# Patient Record
Sex: Female | Born: 1980
Health system: Southern US, Community
[De-identification: ages and names within clinical notes are randomized; demographics above are authoritative.]

## PROBLEM LIST (undated history)

## (undated) ENCOUNTER — Emergency Department (HOSPITAL_COMMUNITY): Admission: EM

## (undated) DIAGNOSIS — Z789 Other specified health status: Secondary | ICD-10-CM

## (undated) DIAGNOSIS — J309 Allergic rhinitis, unspecified: Secondary | ICD-10-CM

## (undated) DIAGNOSIS — F1090 Alcohol use, unspecified, uncomplicated: Secondary | ICD-10-CM

## (undated) DIAGNOSIS — R87619 Unspecified abnormal cytological findings in specimens from cervix uteri: Secondary | ICD-10-CM

## (undated) DIAGNOSIS — T7840XA Allergy, unspecified, initial encounter: Secondary | ICD-10-CM

## (undated) DIAGNOSIS — K759 Inflammatory liver disease, unspecified: Secondary | ICD-10-CM

## (undated) DIAGNOSIS — M069 Rheumatoid arthritis, unspecified: Secondary | ICD-10-CM

## (undated) DIAGNOSIS — S82892A Other fracture of left lower leg, initial encounter for closed fracture: Secondary | ICD-10-CM

## (undated) DIAGNOSIS — N760 Acute vaginitis: Secondary | ICD-10-CM

## (undated) DIAGNOSIS — I10 Essential (primary) hypertension: Secondary | ICD-10-CM

## (undated) DIAGNOSIS — F32A Depression, unspecified: Secondary | ICD-10-CM

## (undated) DIAGNOSIS — G709 Myoneural disorder, unspecified: Secondary | ICD-10-CM

## (undated) DIAGNOSIS — B009 Herpesviral infection, unspecified: Secondary | ICD-10-CM

## (undated) DIAGNOSIS — E7800 Pure hypercholesterolemia, unspecified: Secondary | ICD-10-CM

## (undated) DIAGNOSIS — Z7289 Other problems related to lifestyle: Secondary | ICD-10-CM

## (undated) DIAGNOSIS — R87629 Unspecified abnormal cytological findings in specimens from vagina: Secondary | ICD-10-CM

## (undated) DIAGNOSIS — F419 Anxiety disorder, unspecified: Secondary | ICD-10-CM

## (undated) DIAGNOSIS — K219 Gastro-esophageal reflux disease without esophagitis: Secondary | ICD-10-CM

## (undated) DIAGNOSIS — D649 Anemia, unspecified: Secondary | ICD-10-CM

## (undated) DIAGNOSIS — B9689 Other specified bacterial agents as the cause of diseases classified elsewhere: Secondary | ICD-10-CM

## (undated) DIAGNOSIS — IMO0002 Reserved for concepts with insufficient information to code with codable children: Secondary | ICD-10-CM

## (undated) DIAGNOSIS — R569 Unspecified convulsions: Secondary | ICD-10-CM

## (undated) DIAGNOSIS — K859 Acute pancreatitis without necrosis or infection, unspecified: Secondary | ICD-10-CM

## (undated) HISTORY — DX: Pure hypercholesterolemia, unspecified: E78.00

## (undated) HISTORY — DX: Unspecified abnormal cytological findings in specimens from cervix uteri: R87.619

## (undated) HISTORY — DX: Essential (primary) hypertension: I10

## (undated) HISTORY — DX: Unspecified abnormal cytological findings in specimens from vagina: R87.629

## (undated) HISTORY — DX: Myoneural disorder, unspecified: G70.9

## (undated) HISTORY — DX: Acute pancreatitis without necrosis or infection, unspecified: K85.90

## (undated) HISTORY — DX: Rheumatoid arthritis, unspecified: M06.9

## (undated) HISTORY — DX: Other specified bacterial agents as the cause of diseases classified elsewhere: B96.89

## (undated) HISTORY — DX: Other specified bacterial agents as the cause of diseases classified elsewhere: N76.0

## (undated) HISTORY — DX: Allergic rhinitis, unspecified: J30.9

## (undated) HISTORY — DX: Depression, unspecified: F32.A

## (undated) HISTORY — DX: Inflammatory liver disease, unspecified: K75.9

## (undated) HISTORY — DX: Other fracture of left lower leg, initial encounter for closed fracture: S82.892A

## (undated) HISTORY — DX: Anemia, unspecified: D64.9

## (undated) HISTORY — DX: Anxiety disorder, unspecified: F41.9

## (undated) HISTORY — DX: Reserved for concepts with insufficient information to code with codable children: IMO0002

## (undated) HISTORY — DX: Allergy, unspecified, initial encounter: T78.40XA

## (undated) HISTORY — DX: Herpesviral infection, unspecified: B00.9

## (undated) HISTORY — PX: OTHER SURGICAL HISTORY: SHX169

---

## 1999-05-18 ENCOUNTER — Inpatient Hospital Stay (HOSPITAL_COMMUNITY): Admission: AD | Admit: 1999-05-18 | Discharge: 1999-05-18 | Payer: Self-pay | Admitting: Obstetrics & Gynecology

## 1999-11-20 ENCOUNTER — Emergency Department (HOSPITAL_COMMUNITY): Admission: EM | Admit: 1999-11-20 | Discharge: 1999-11-20 | Payer: Self-pay | Admitting: Emergency Medicine

## 2000-08-24 ENCOUNTER — Encounter (INDEPENDENT_AMBULATORY_CARE_PROVIDER_SITE_OTHER): Payer: Self-pay | Admitting: Specialist

## 2000-08-24 ENCOUNTER — Other Ambulatory Visit: Admission: RE | Admit: 2000-08-24 | Discharge: 2000-08-24 | Payer: Self-pay | Admitting: Obstetrics

## 2001-02-11 ENCOUNTER — Ambulatory Visit (HOSPITAL_COMMUNITY): Admission: RE | Admit: 2001-02-11 | Discharge: 2001-02-11 | Payer: Self-pay | Admitting: *Deleted

## 2001-02-20 ENCOUNTER — Inpatient Hospital Stay (HOSPITAL_COMMUNITY): Admission: AD | Admit: 2001-02-20 | Discharge: 2001-02-20 | Payer: Self-pay | Admitting: *Deleted

## 2001-07-02 ENCOUNTER — Inpatient Hospital Stay (HOSPITAL_COMMUNITY): Admission: AD | Admit: 2001-07-02 | Discharge: 2001-07-02 | Payer: Self-pay | Admitting: *Deleted

## 2001-07-09 ENCOUNTER — Encounter (HOSPITAL_COMMUNITY): Admission: RE | Admit: 2001-07-09 | Discharge: 2001-07-16 | Payer: Self-pay | Admitting: *Deleted

## 2001-07-12 ENCOUNTER — Encounter: Payer: Self-pay | Admitting: Obstetrics & Gynecology

## 2001-07-12 ENCOUNTER — Inpatient Hospital Stay (HOSPITAL_COMMUNITY): Admission: AD | Admit: 2001-07-12 | Discharge: 2001-07-12 | Payer: Self-pay | Admitting: *Deleted

## 2001-07-17 ENCOUNTER — Encounter (INDEPENDENT_AMBULATORY_CARE_PROVIDER_SITE_OTHER): Payer: Self-pay

## 2001-07-17 ENCOUNTER — Inpatient Hospital Stay (HOSPITAL_COMMUNITY): Admission: AD | Admit: 2001-07-17 | Discharge: 2001-07-20 | Payer: Self-pay | Admitting: Obstetrics

## 2007-08-13 ENCOUNTER — Emergency Department (HOSPITAL_COMMUNITY): Admission: EM | Admit: 2007-08-13 | Discharge: 2007-08-13 | Payer: Self-pay | Admitting: Emergency Medicine

## 2008-02-12 ENCOUNTER — Other Ambulatory Visit: Admission: RE | Admit: 2008-02-12 | Discharge: 2008-02-12 | Payer: Self-pay | Admitting: Obstetrics and Gynecology

## 2008-08-21 ENCOUNTER — Other Ambulatory Visit: Admission: RE | Admit: 2008-08-21 | Discharge: 2008-08-21 | Payer: Self-pay | Admitting: Obstetrics and Gynecology

## 2008-10-01 ENCOUNTER — Emergency Department (HOSPITAL_COMMUNITY): Admission: EM | Admit: 2008-10-01 | Discharge: 2008-10-01 | Payer: Self-pay | Admitting: Emergency Medicine

## 2008-10-22 ENCOUNTER — Emergency Department (HOSPITAL_COMMUNITY): Admission: EM | Admit: 2008-10-22 | Discharge: 2008-10-22 | Payer: Self-pay | Admitting: Emergency Medicine

## 2008-11-24 ENCOUNTER — Emergency Department (HOSPITAL_COMMUNITY): Admission: EM | Admit: 2008-11-24 | Discharge: 2008-11-24 | Payer: Self-pay | Admitting: Emergency Medicine

## 2008-12-16 ENCOUNTER — Emergency Department (HOSPITAL_COMMUNITY): Admission: EM | Admit: 2008-12-16 | Discharge: 2008-12-16 | Payer: Self-pay | Admitting: Emergency Medicine

## 2008-12-16 IMAGING — CR DG CHEST 2V
2 series · 2 of 2 positions shown · non-contrast
Comparison: None

CLINICAL DATA: Diffuse joint pain

CHEST - 2 VIEW

[view not recorded (1 of 2)]
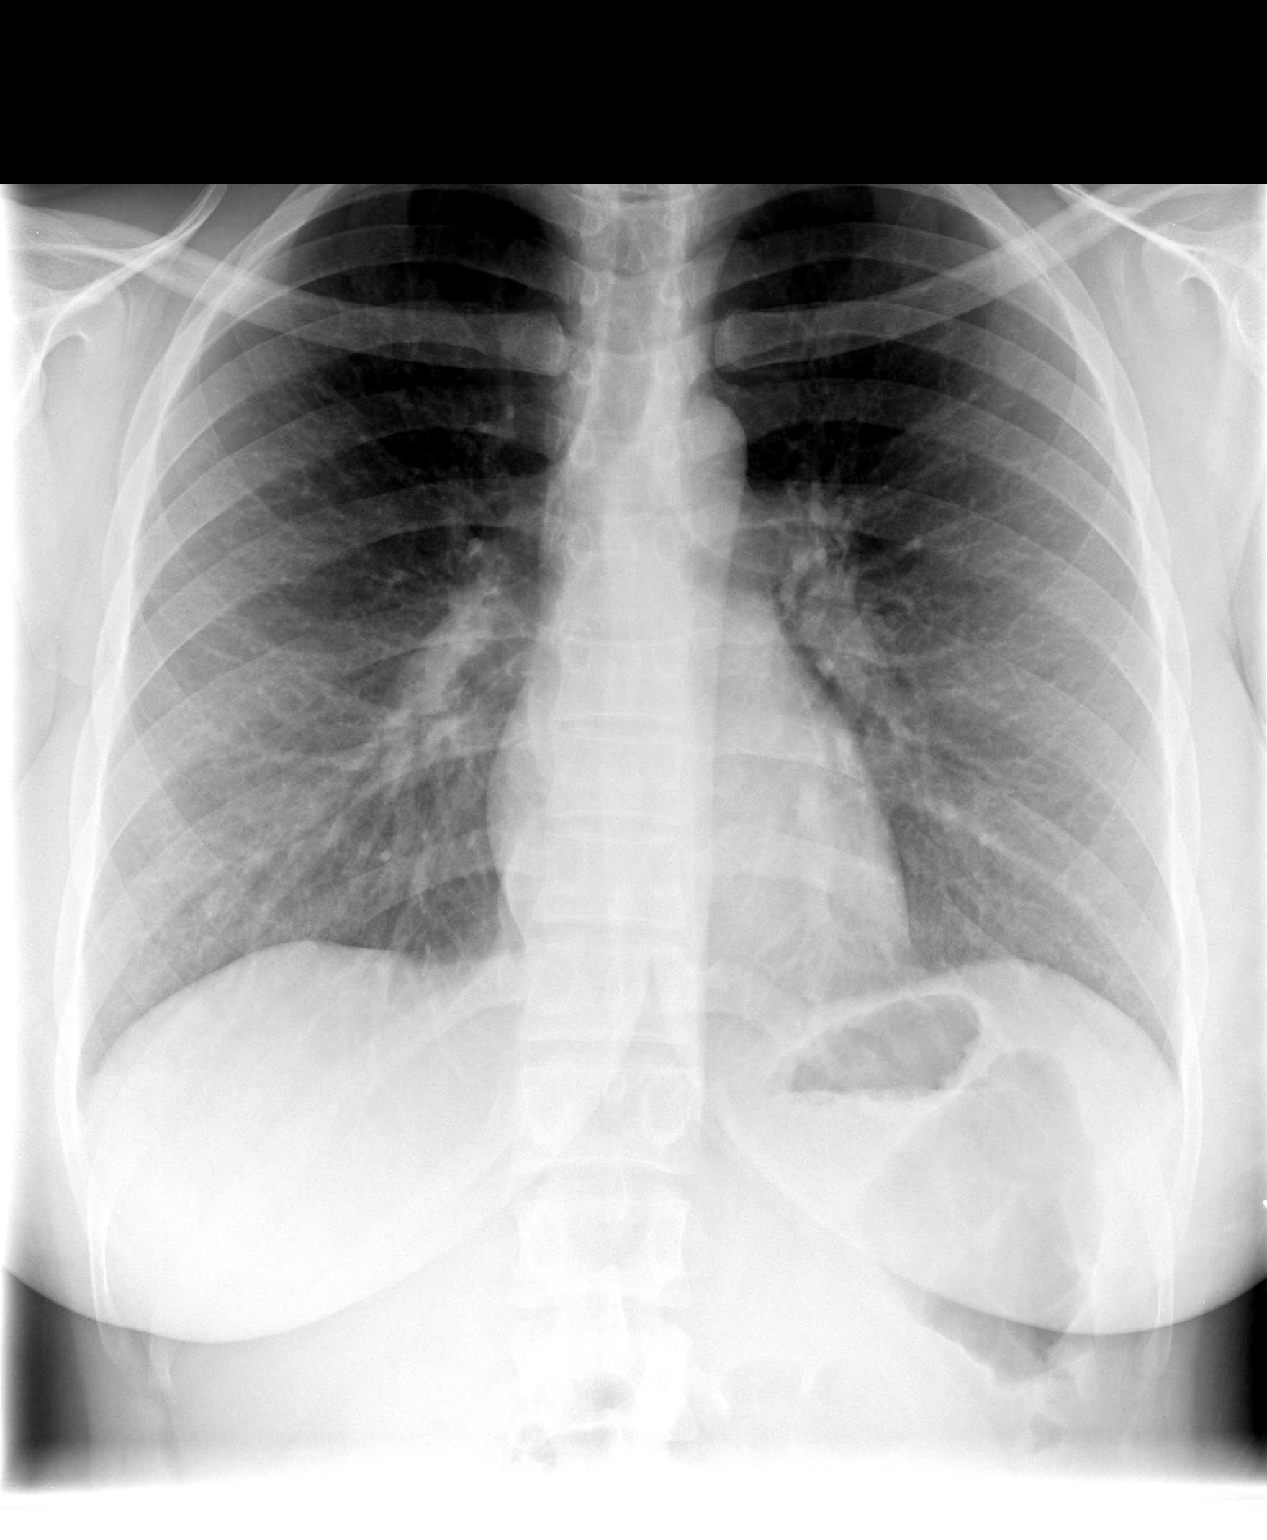

[view not recorded (2 of 2)]
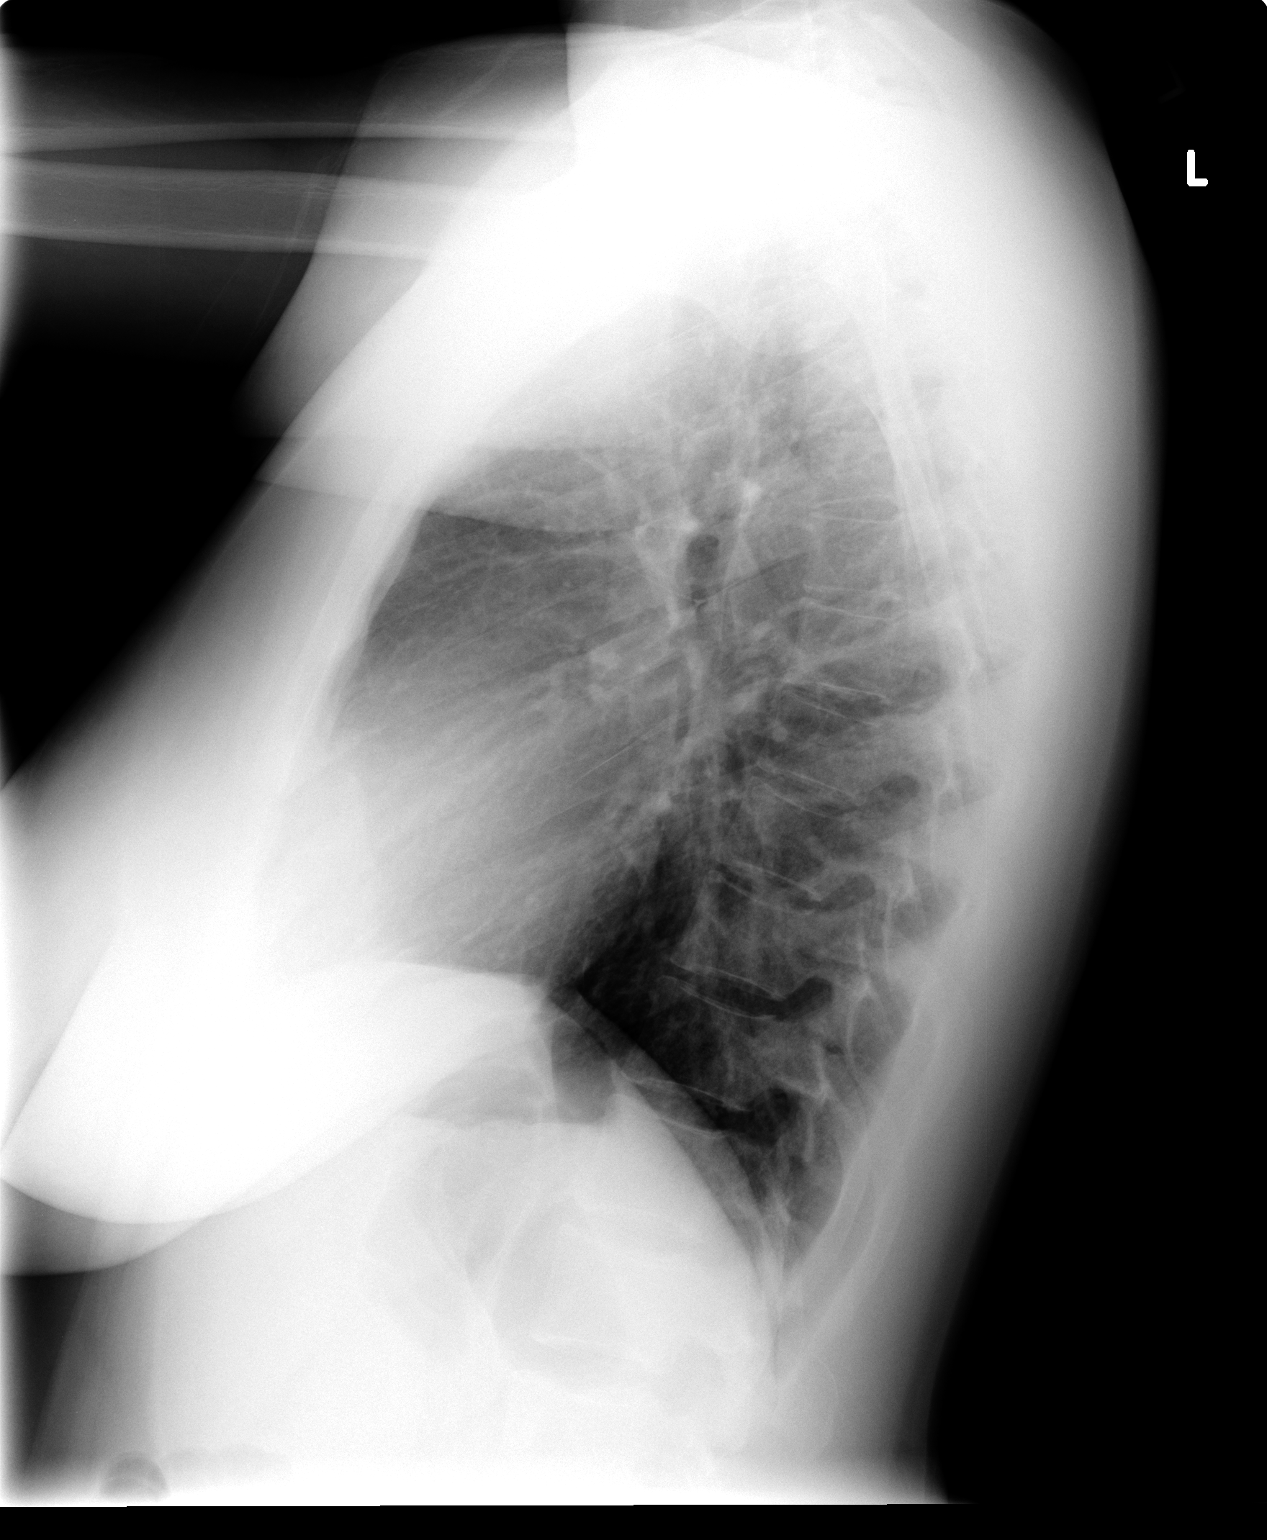

[2 of 2 positions shown; findings below may reference images not displayed]

FINDINGS: Normal heart size, mediastinal contours, and pulmonary vascularity.
Lungs clear.
Bones unremarkable.
No pneumothorax.
IMPRESSION: No acute abnormalities.

## 2009-04-23 ENCOUNTER — Other Ambulatory Visit: Admission: RE | Admit: 2009-04-23 | Discharge: 2009-04-23 | Payer: Self-pay | Admitting: Obstetrics and Gynecology

## 2009-08-30 ENCOUNTER — Other Ambulatory Visit: Admission: RE | Admit: 2009-08-30 | Discharge: 2009-08-30 | Payer: Self-pay | Admitting: Obstetrics and Gynecology

## 2009-09-27 ENCOUNTER — Ambulatory Visit (HOSPITAL_COMMUNITY): Admission: RE | Admit: 2009-09-27 | Discharge: 2009-09-27 | Payer: Self-pay | Admitting: Family Medicine

## 2009-09-27 IMAGING — CR DG HAND COMPLETE 3+V*L*
3 series · 3 of 3 positions shown · non-contrast
Comparison: None

CLINICAL DATA: Question of R A.  Bilateral hand joint pain and
swelling intermittently for 1 year

LEFT HAND - COMPLETE 3+ VIEW

[view not recorded (1 of 3)]
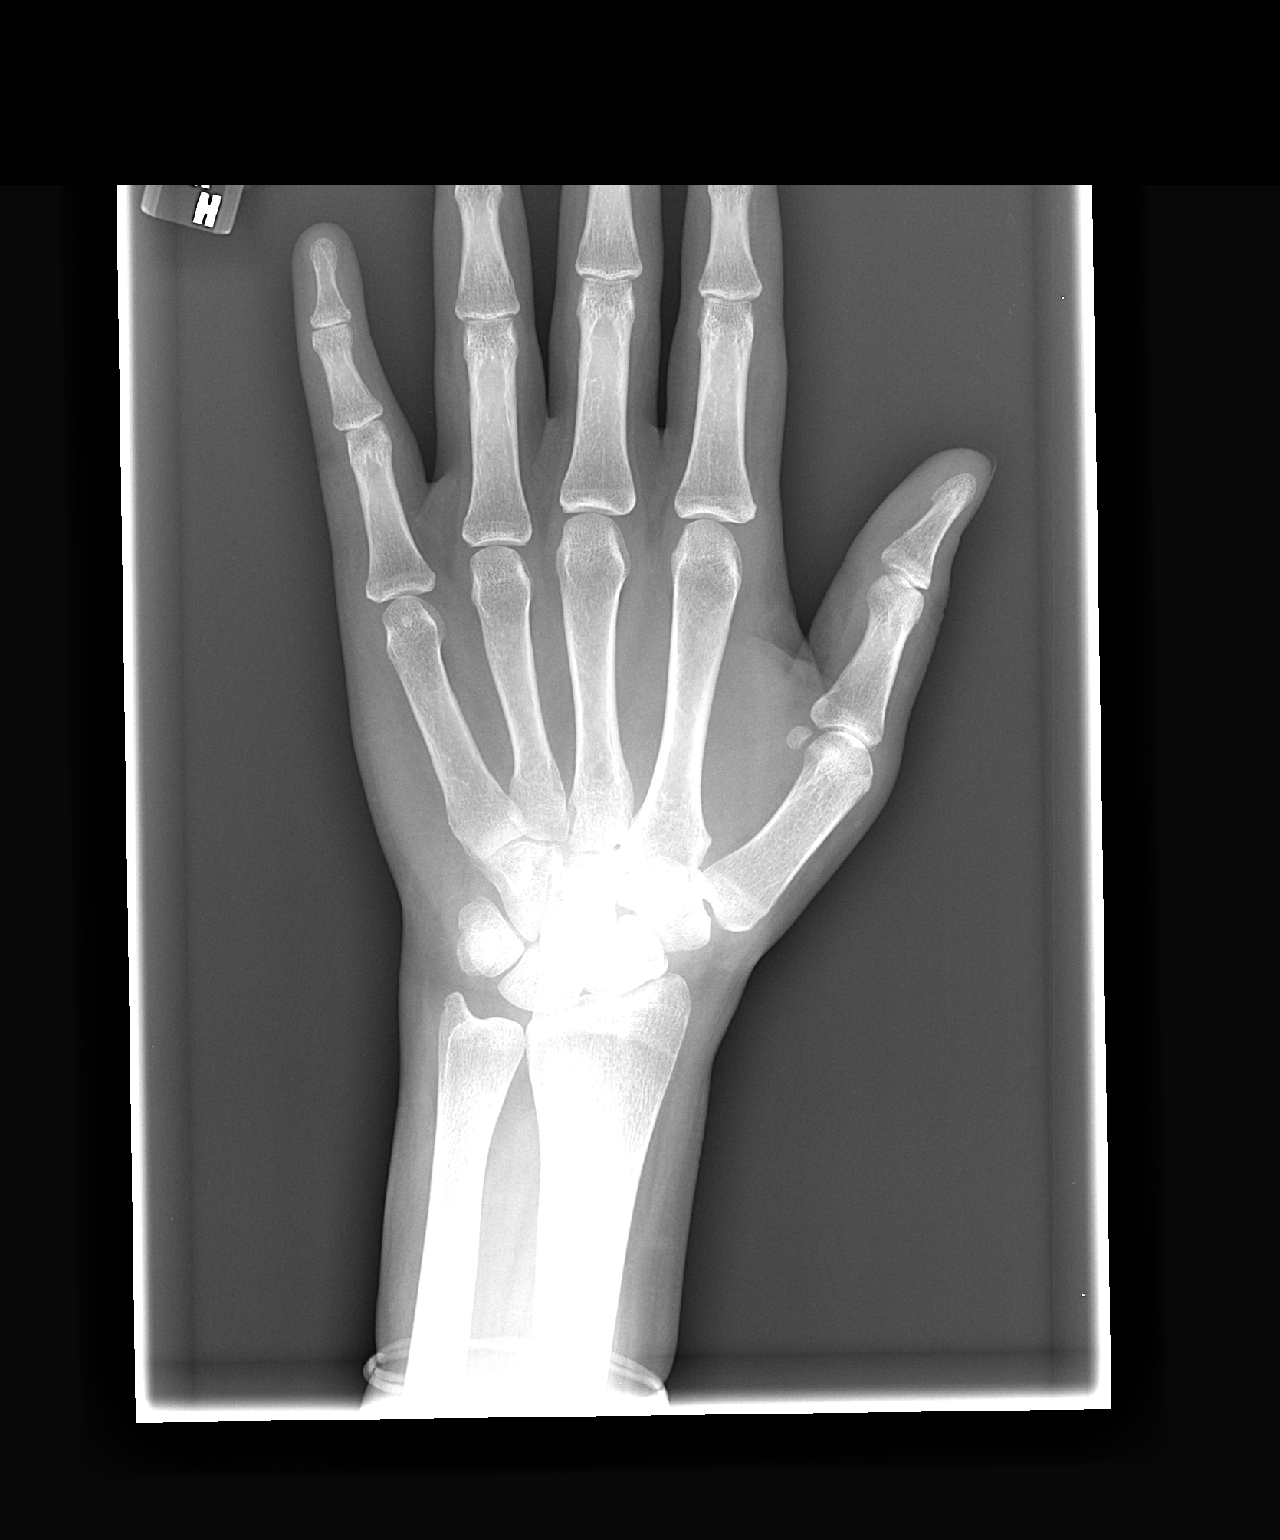

[view not recorded (2 of 3)]
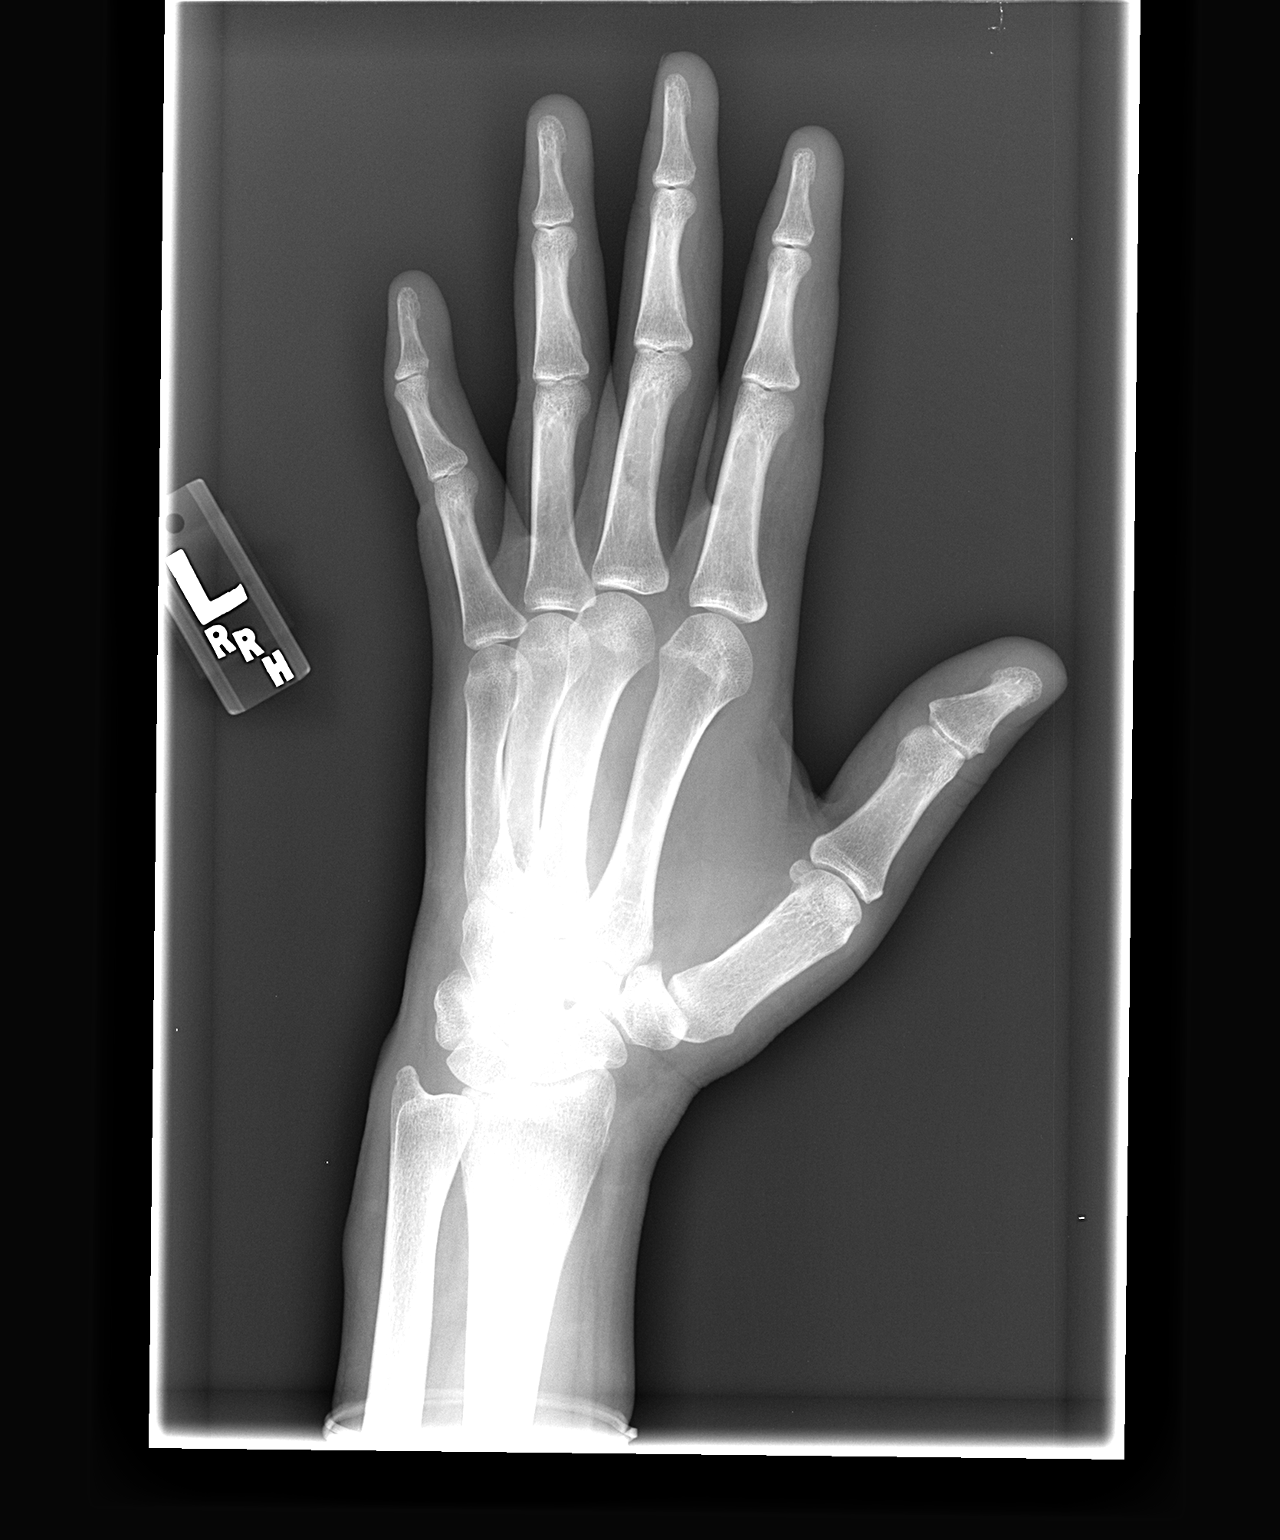

[view not recorded (3 of 3)]
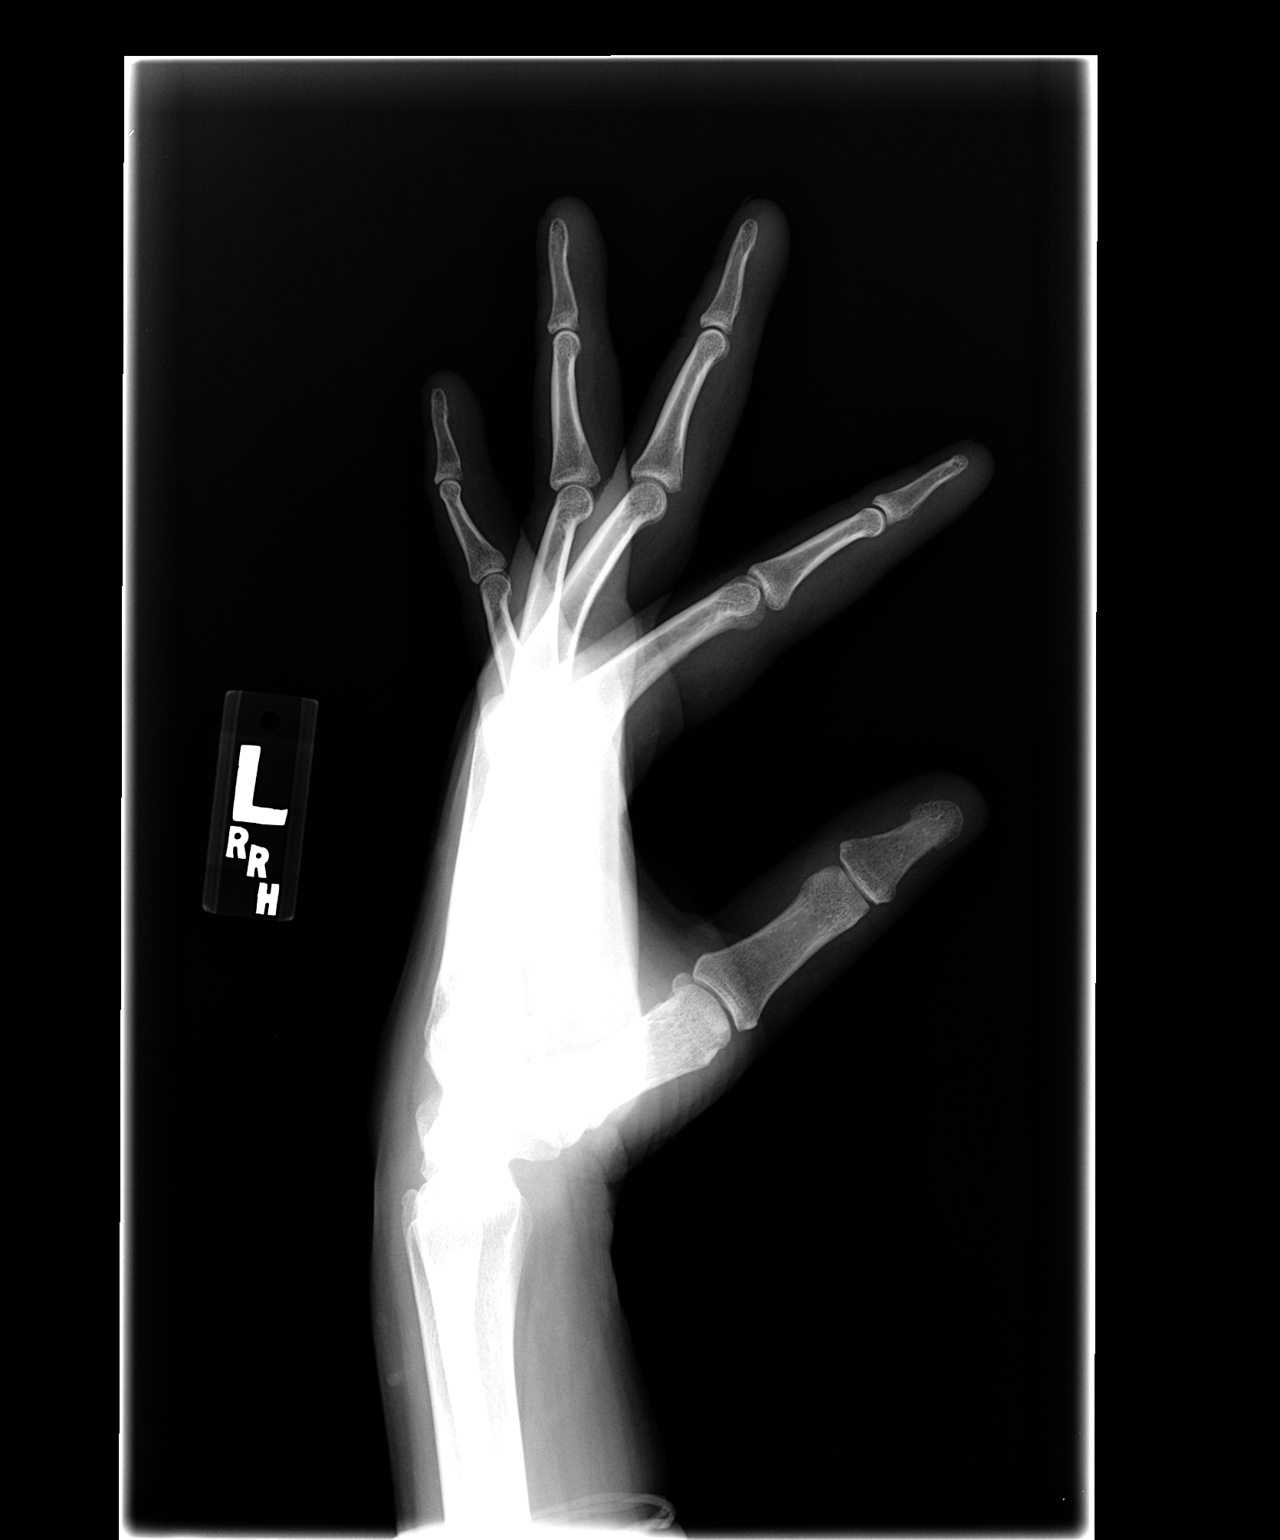

[3 of 3 positions shown; findings below may reference images not displayed]

FINDINGS: No joint space narrowing.  No bony erosions.  No
osteophytic formation.  No soft tissue calcifications.
IMPRESSION: Negative left hand.

## 2009-09-27 IMAGING — CR DG HAND COMPLETE 3+V*R*
3 series · 3 of 3 positions shown · non-contrast
Comparison: None

CLINICAL DATA: Query R A.  Bilateral hand joint pain and swelling
intermittently for 1 year.

RIGHT HAND - COMPLETE 3+ VIEW

[view not recorded (1 of 3)]
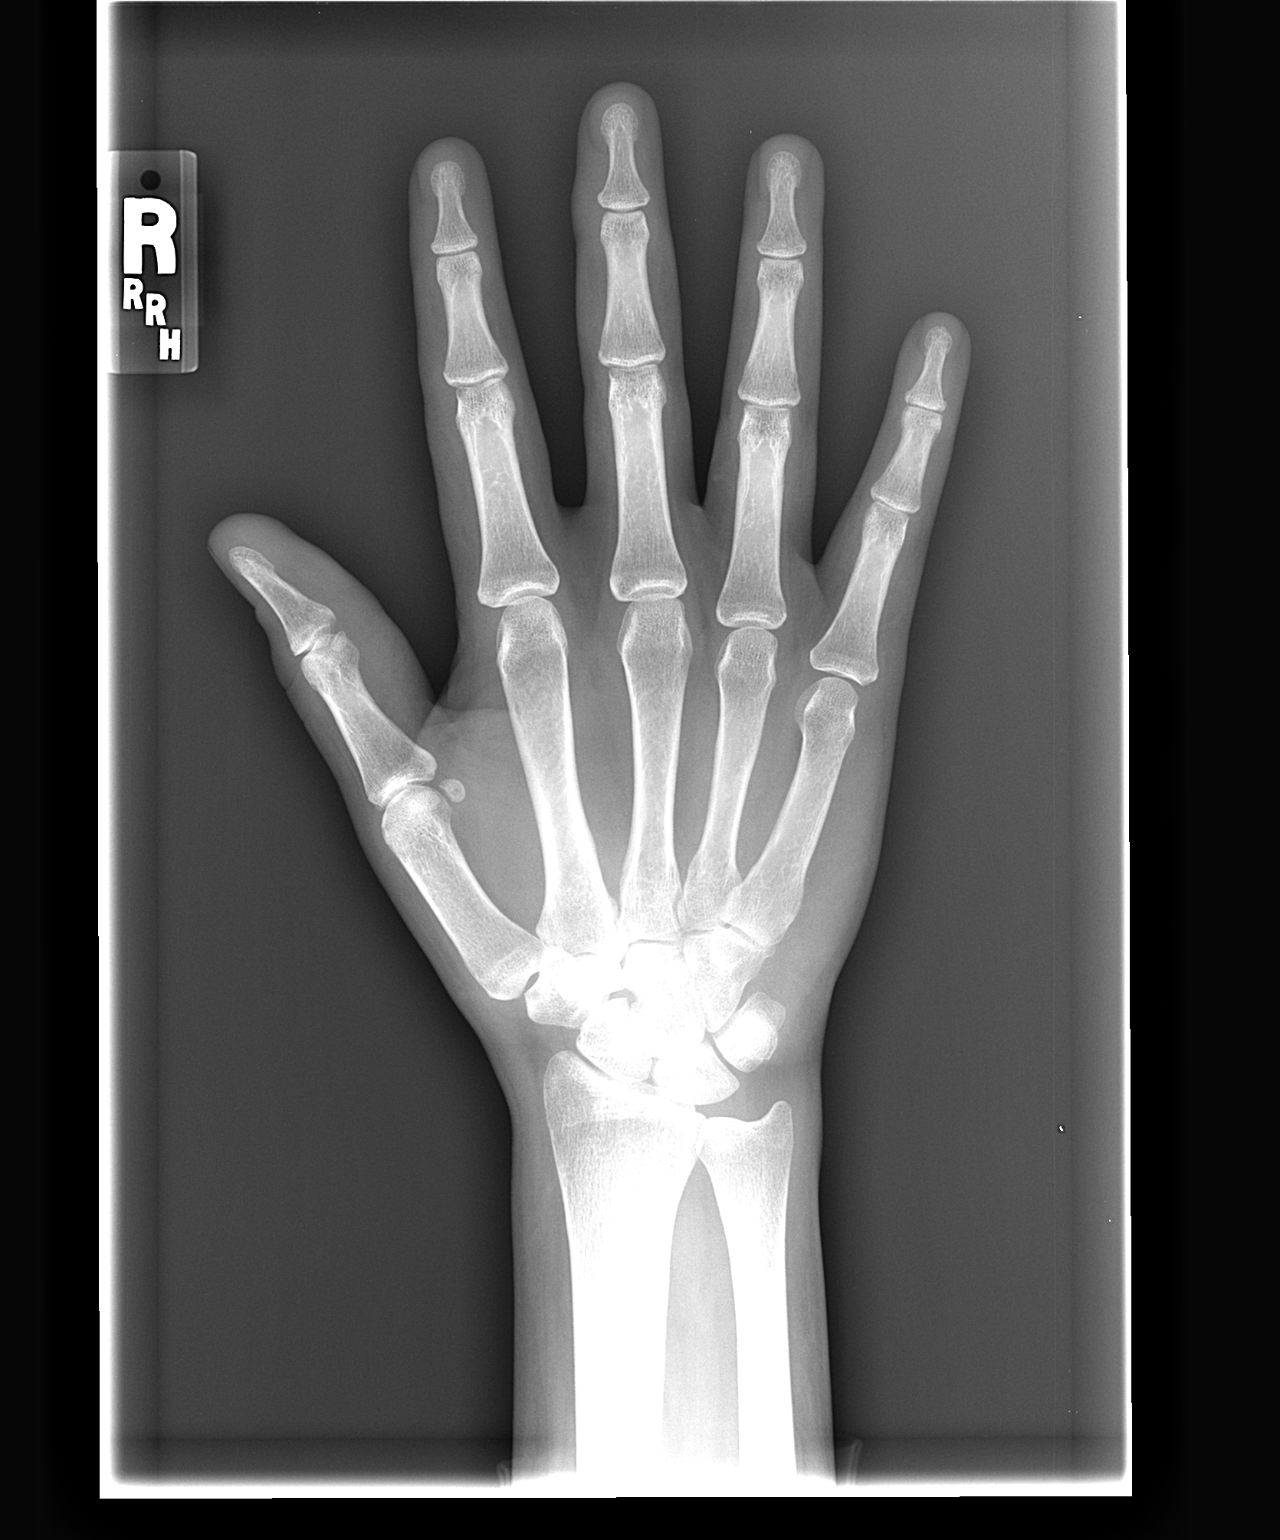

[view not recorded (2 of 3)]
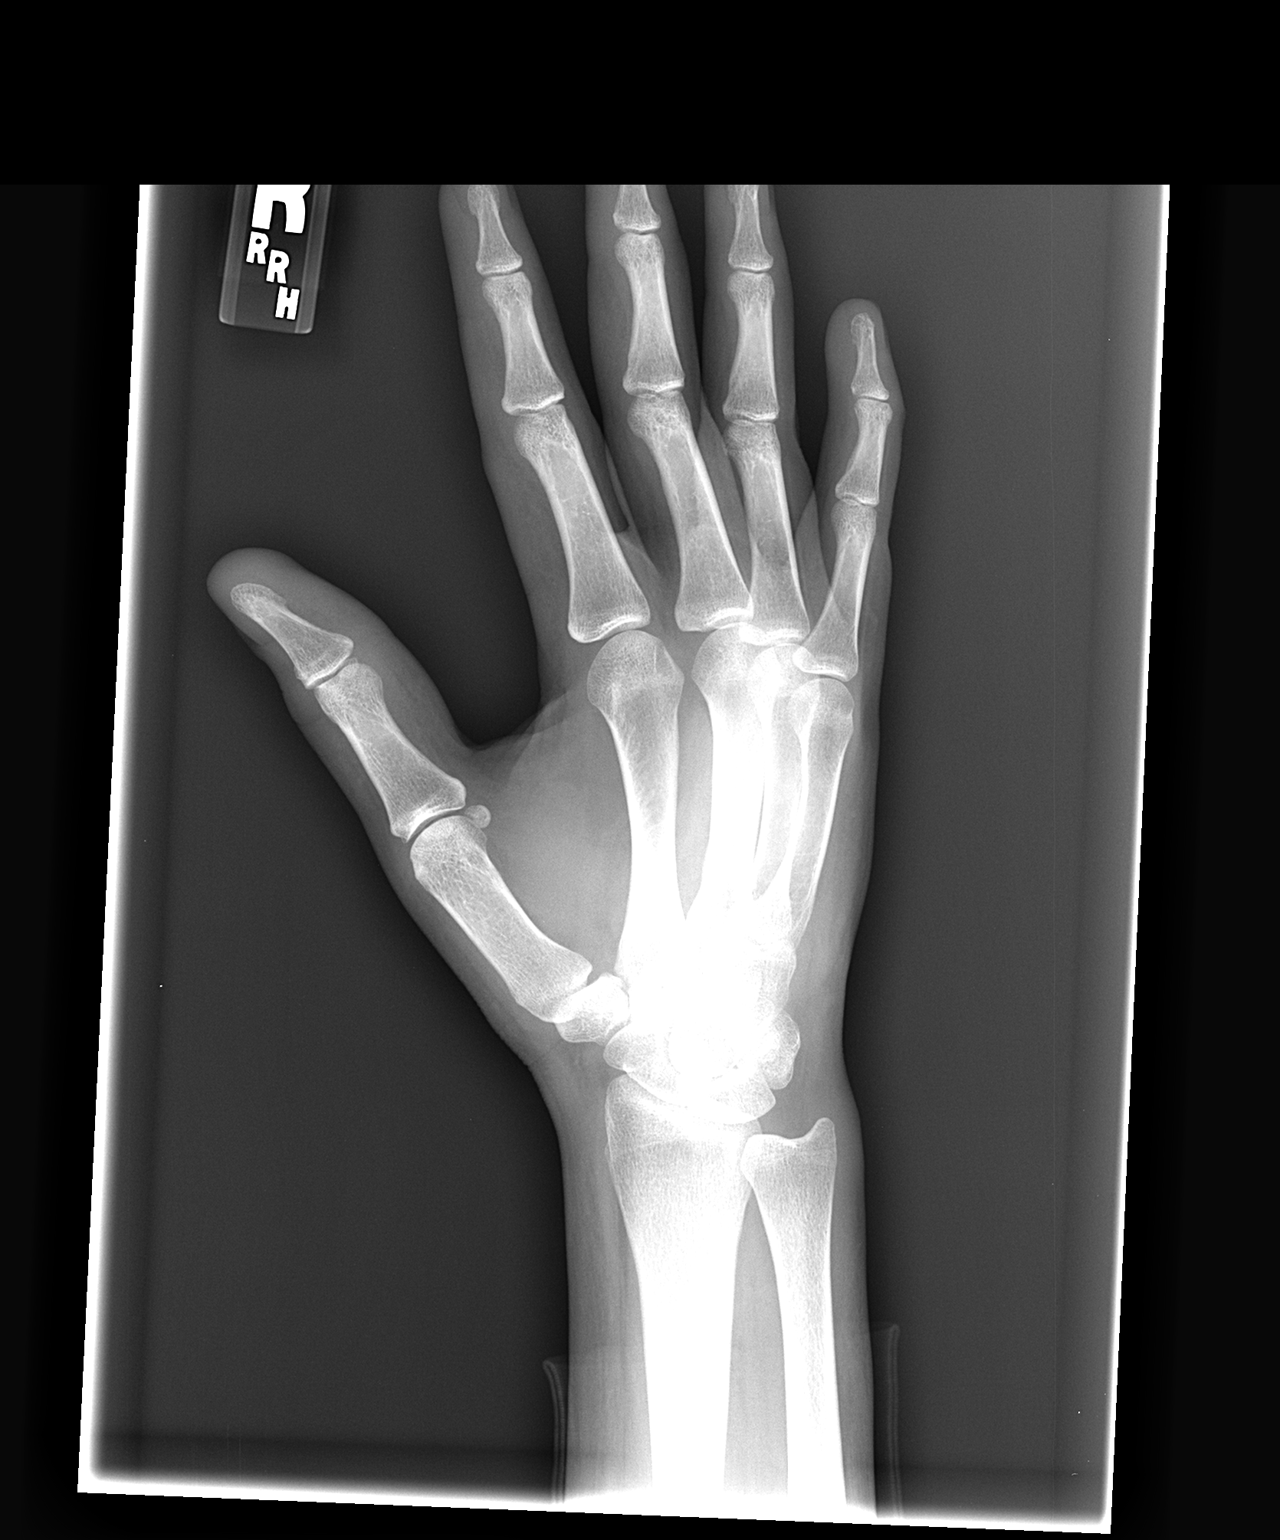

[view not recorded (3 of 3)]
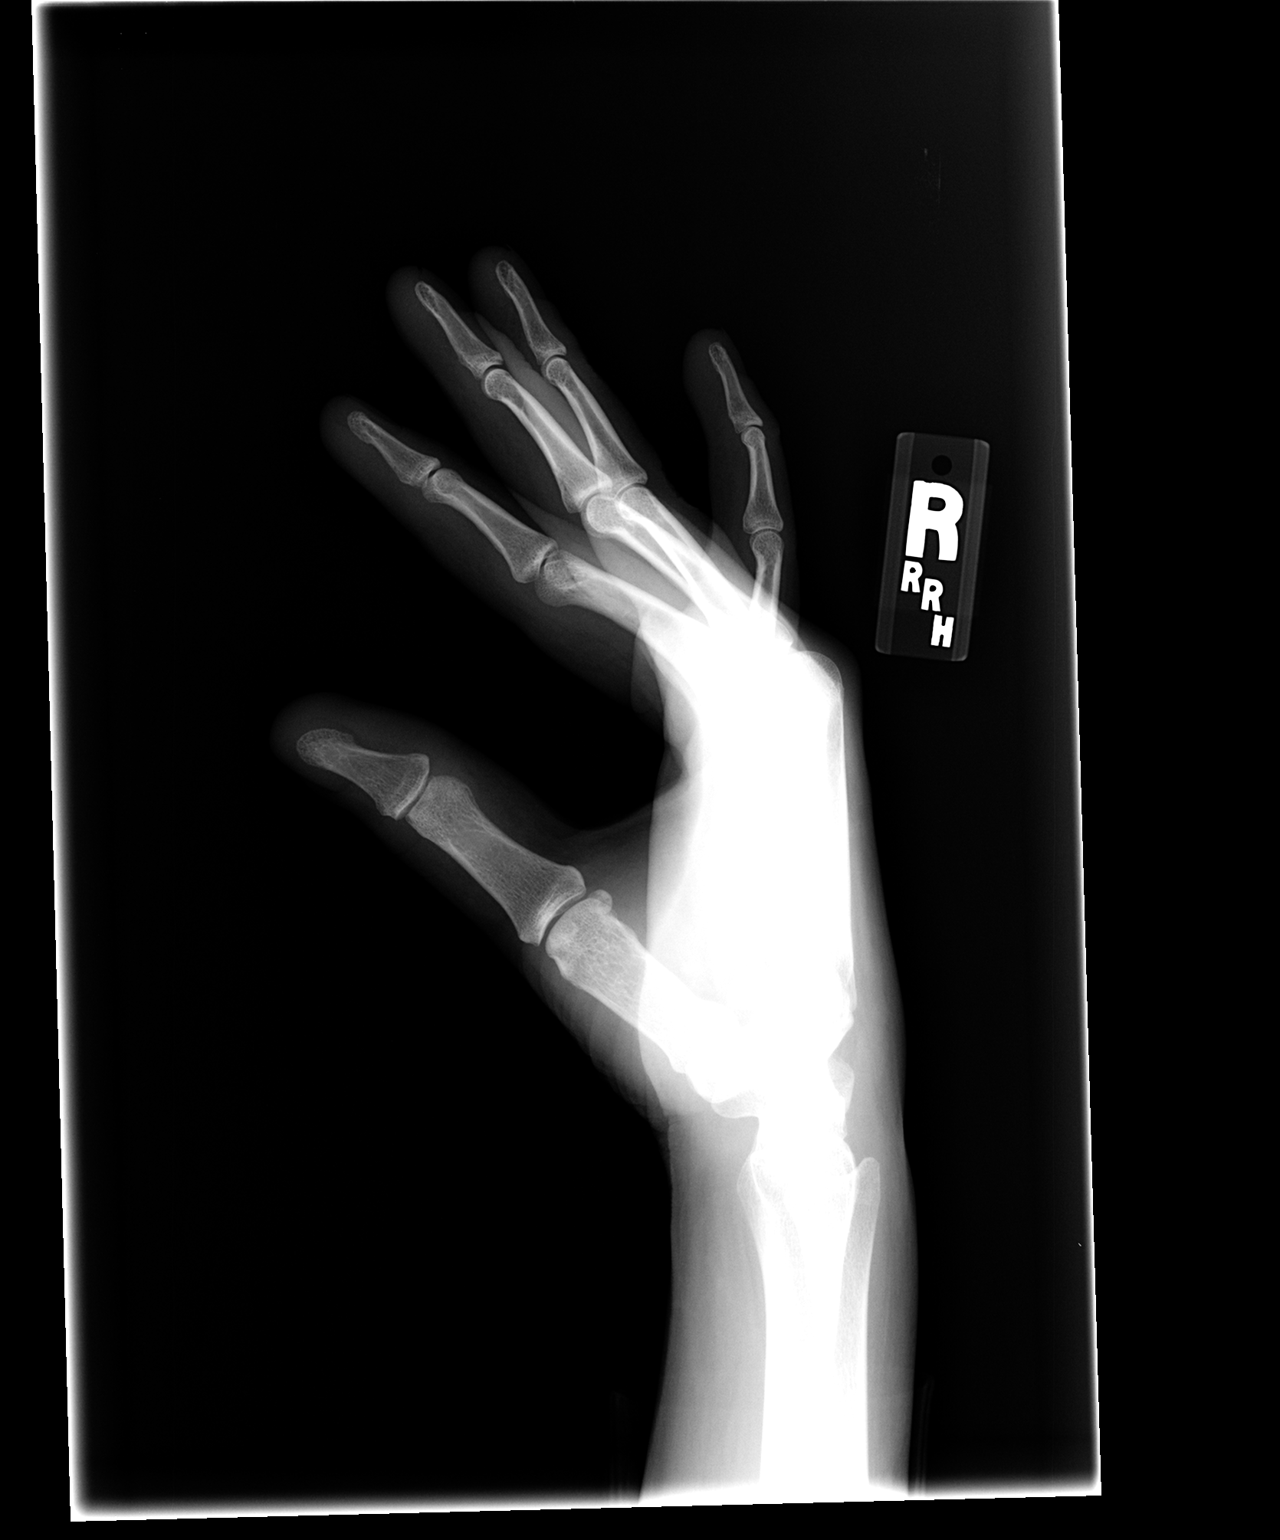

[3 of 3 positions shown; findings below may reference images not displayed]

FINDINGS: No joint space narrowing.  No para-articular erosions.
No lytic or sclerotic lesions.  No osteophytes.  No soft tissue
calcifications.
IMPRESSION: Negative right hand.

## 2010-03-24 ENCOUNTER — Emergency Department (HOSPITAL_COMMUNITY): Admission: EM | Admit: 2010-03-24 | Discharge: 2010-03-24 | Payer: Self-pay | Admitting: Emergency Medicine

## 2010-04-04 ENCOUNTER — Other Ambulatory Visit: Admission: RE | Admit: 2010-04-04 | Discharge: 2010-04-04 | Payer: Self-pay | Admitting: Obstetrics and Gynecology

## 2010-04-11 ENCOUNTER — Ambulatory Visit (HOSPITAL_COMMUNITY): Admission: RE | Admit: 2010-04-11 | Discharge: 2010-04-11 | Payer: Self-pay | Admitting: Obstetrics & Gynecology

## 2010-04-11 IMAGING — US US OB NUCHAL TRANSLUCENCY 1ST GEST
1 series · 14 of 28 positions shown · non-contrast
Comparison: none

OBSTETRICAL ULTRASOUND:
 This ultrasound was performed in The [HOSPITAL], and the [facility] report will be stored to [REDACTED] PACS.  This report is also available in [HOSPITAL]?s accessANYware.

[Series 1: us ob nuchal translucency 1st gest · 14 of 39 slices shown]
[im 2/39]
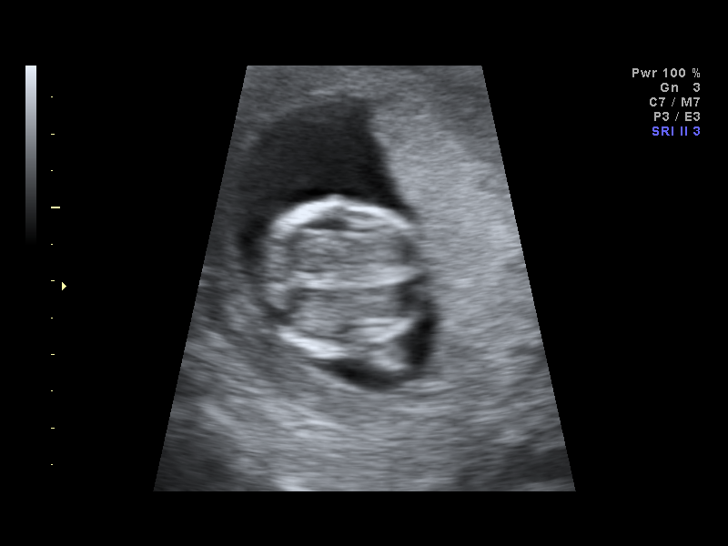
[im 5/39]
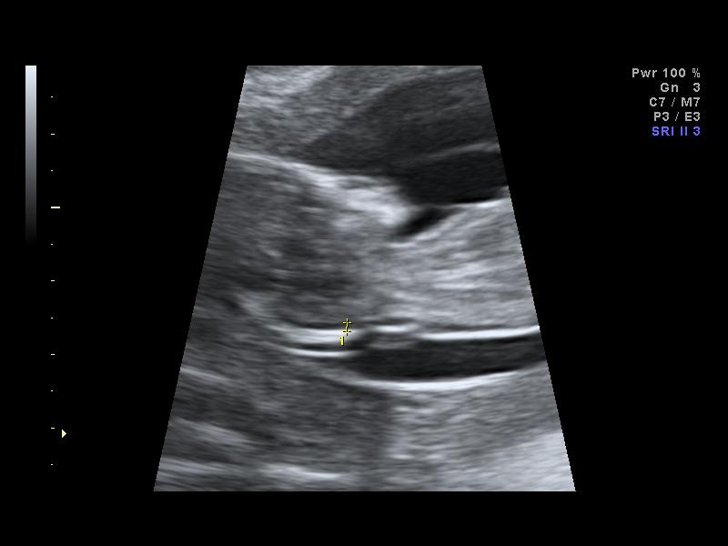
[im 8/39]
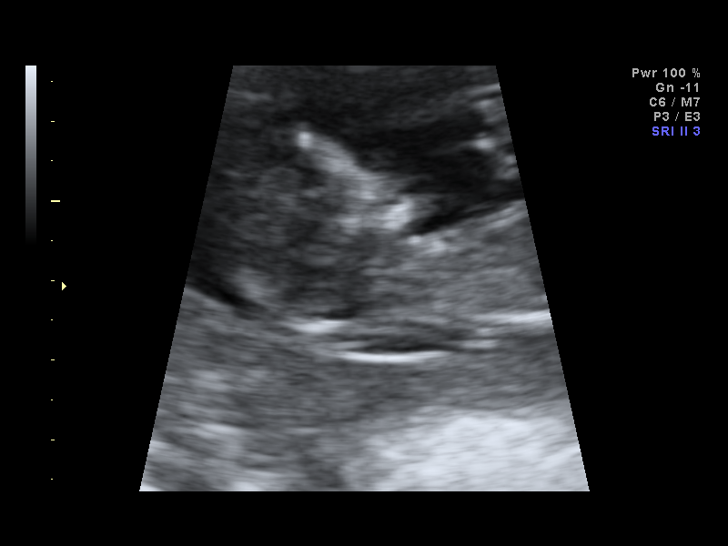
[im 10/39]
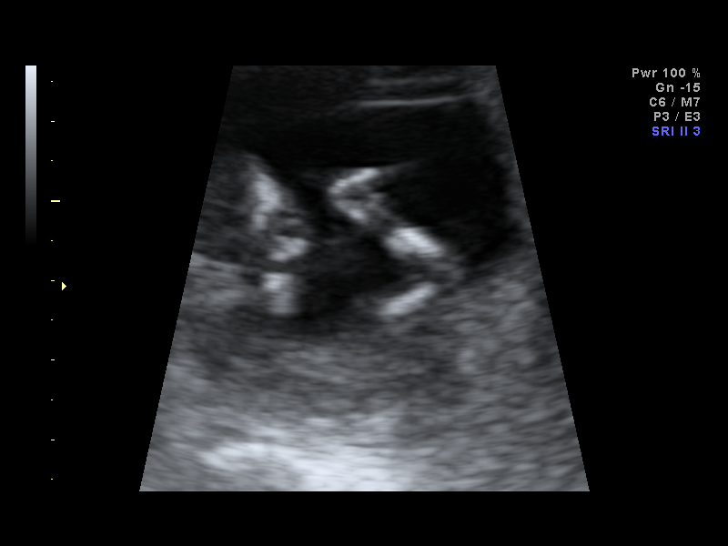
[im 13/39]
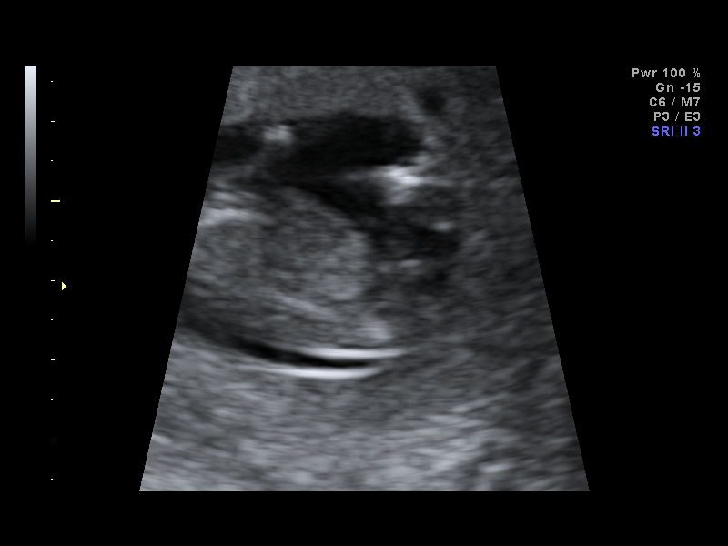
[im 16/39]
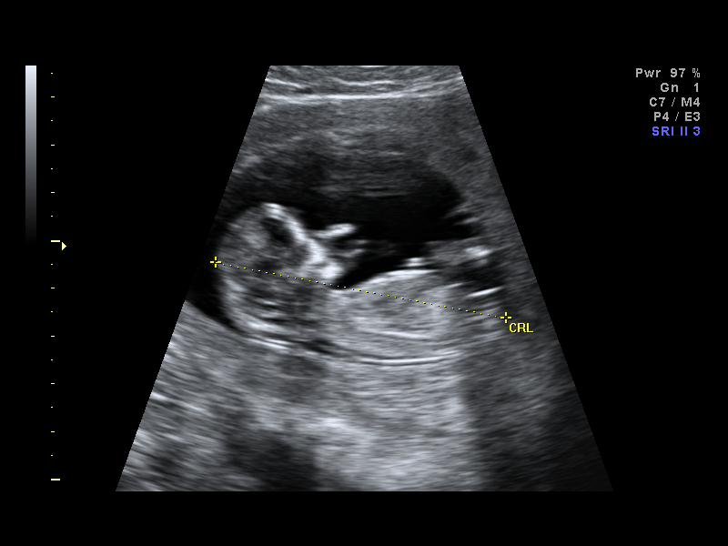
[im 19/39]
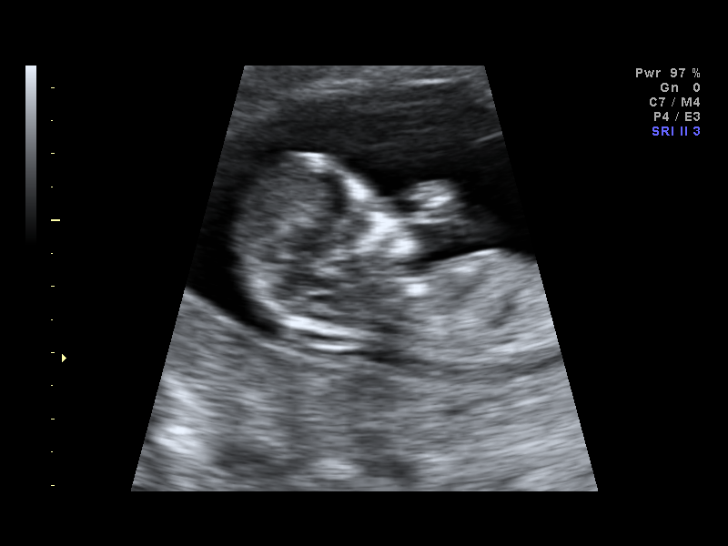
[im 22/39]
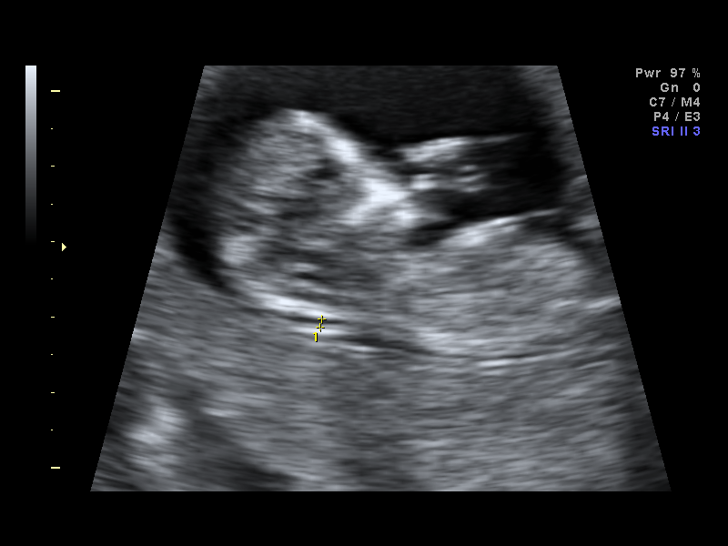
[im 24/39]
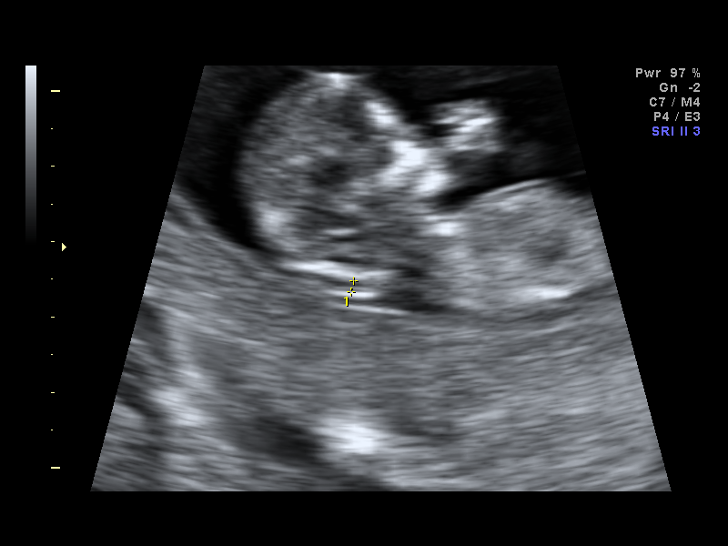
[im 27/39]
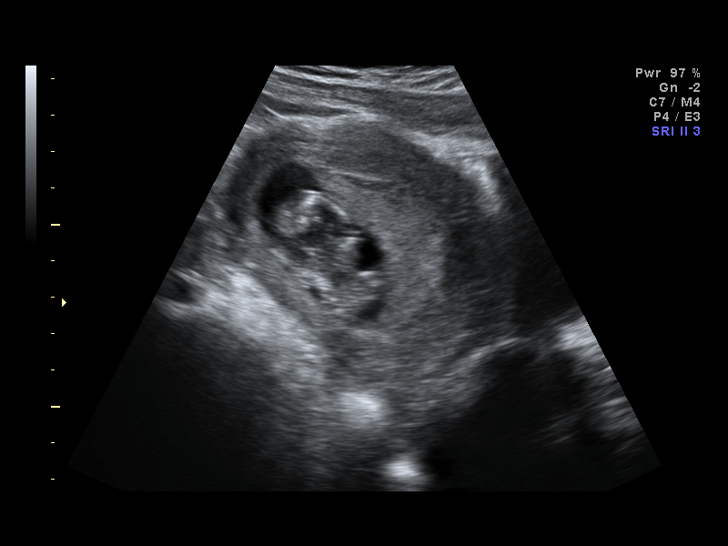
[im 30/39]
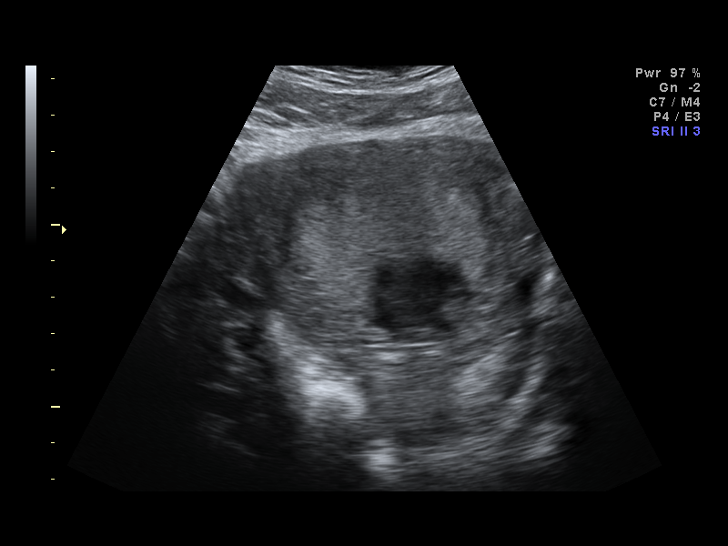
[im 33/39]
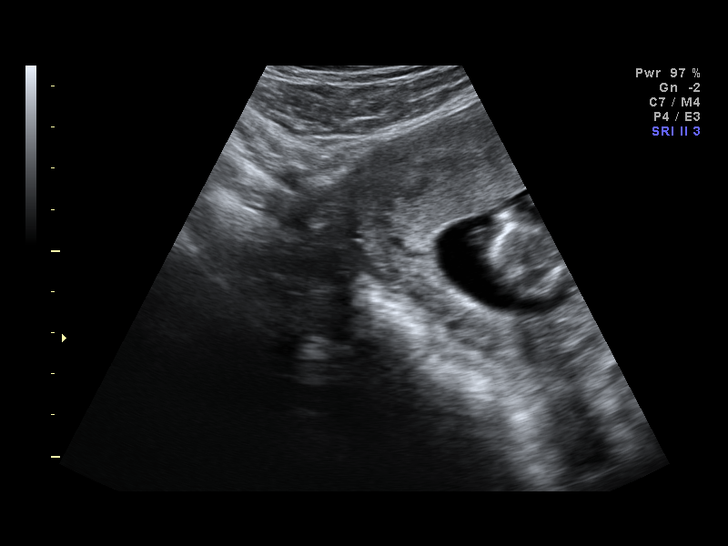
[im 36/39]
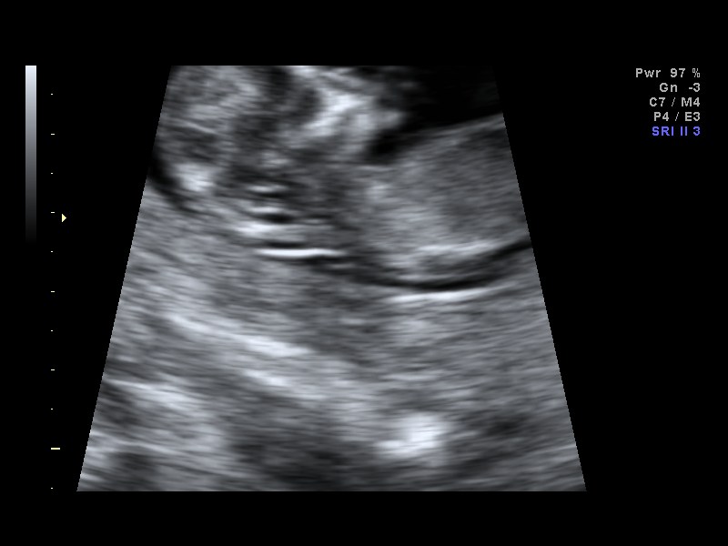
[im 39/39]
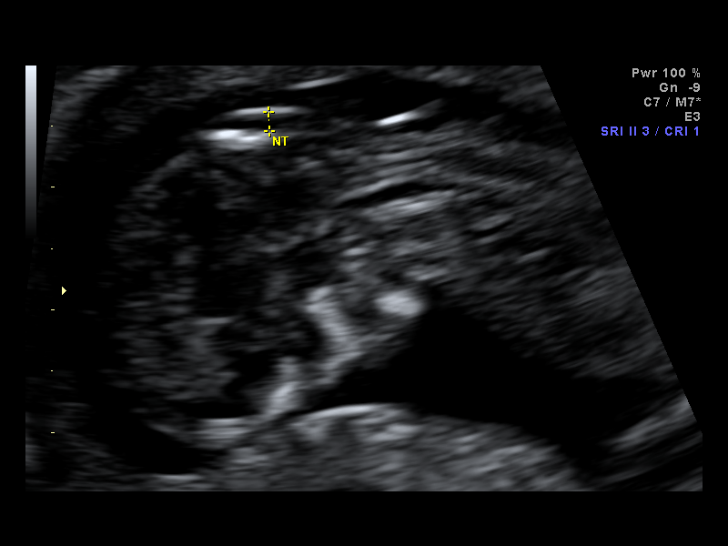

[14 of 28 positions shown; findings below may reference images not displayed]

IMPRESSION: [facility] has also been faxed to the ordering physician.

## 2010-05-03 ENCOUNTER — Ambulatory Visit (HOSPITAL_COMMUNITY): Admission: RE | Admit: 2010-05-03 | Discharge: 2010-05-03 | Payer: Self-pay | Admitting: Obstetrics & Gynecology

## 2010-05-17 ENCOUNTER — Ambulatory Visit (HOSPITAL_COMMUNITY): Admission: RE | Admit: 2010-05-17 | Discharge: 2010-05-17 | Payer: Self-pay | Admitting: Obstetrics & Gynecology

## 2010-05-17 IMAGING — US US OB DETAIL+14 WK
1 series · 14 of 28 positions shown · non-contrast
Comparison: none

OBSTETRICAL ULTRASOUND:
 This ultrasound was performed in The [HOSPITAL], and the [facility] report will be stored to [REDACTED] PACS.  This report is also available in [HOSPITAL]?s accessANYware.

[Series 1: us ob detail+14 wk · 14 of 108 slices shown]
[im 4/108]
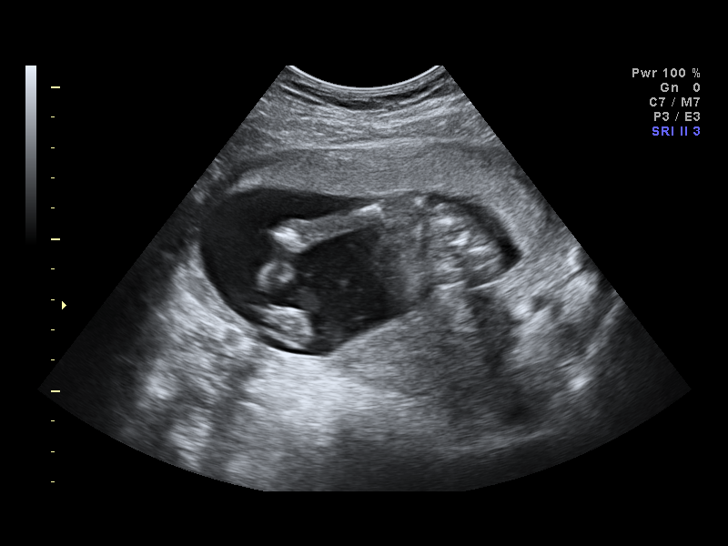
[im 12/108]
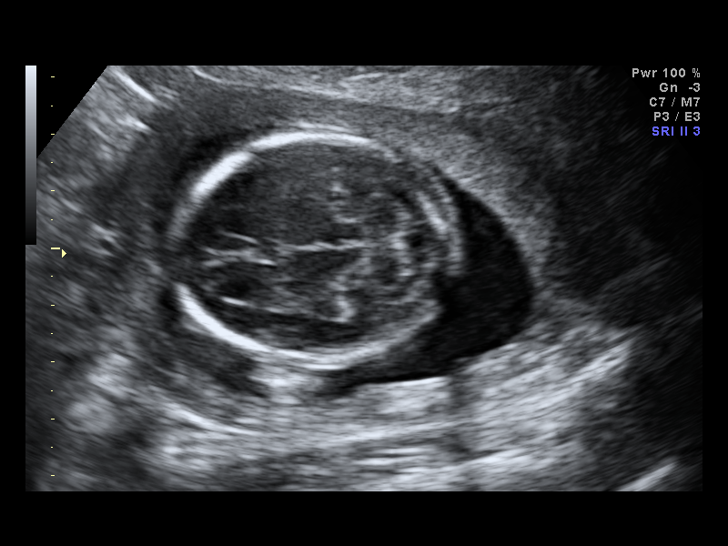
[im 20/108]
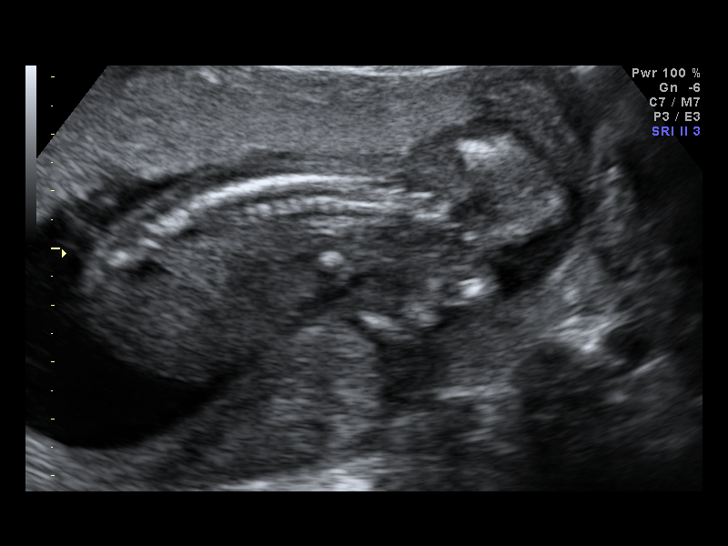
[im 28/108]
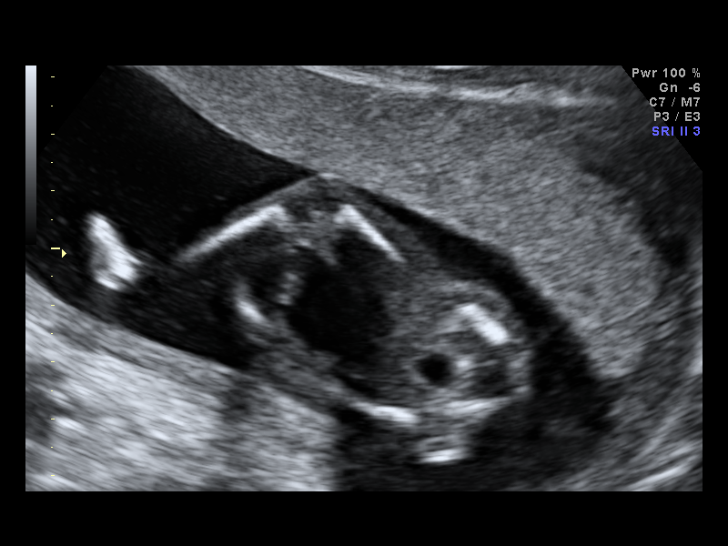
[im 36/108]
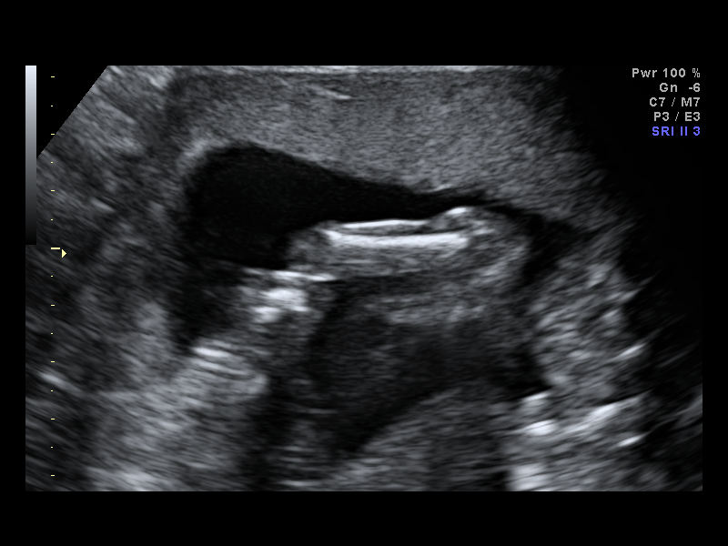
[im 44/108]
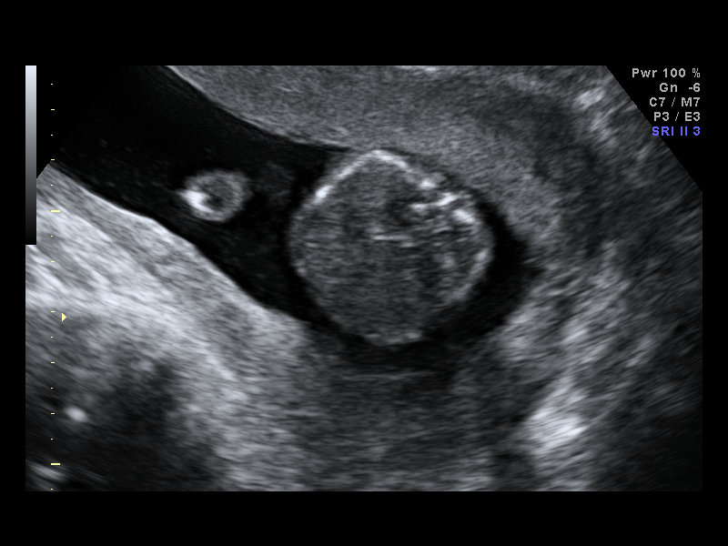
[im 52/108]
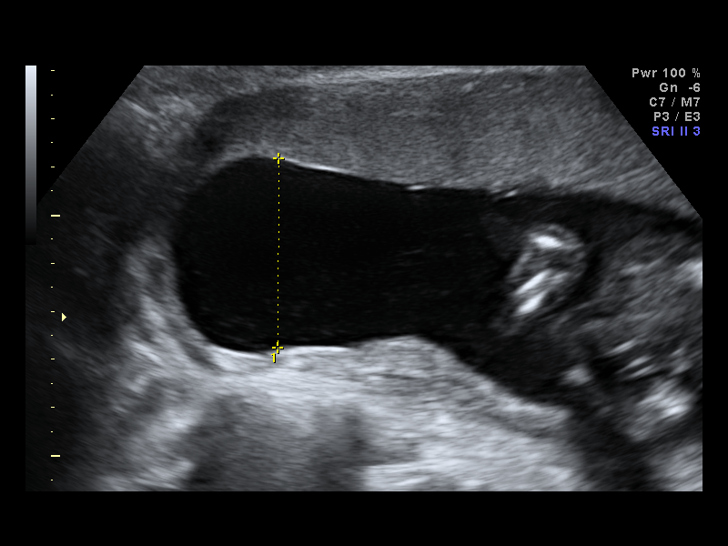
[im 60/108]
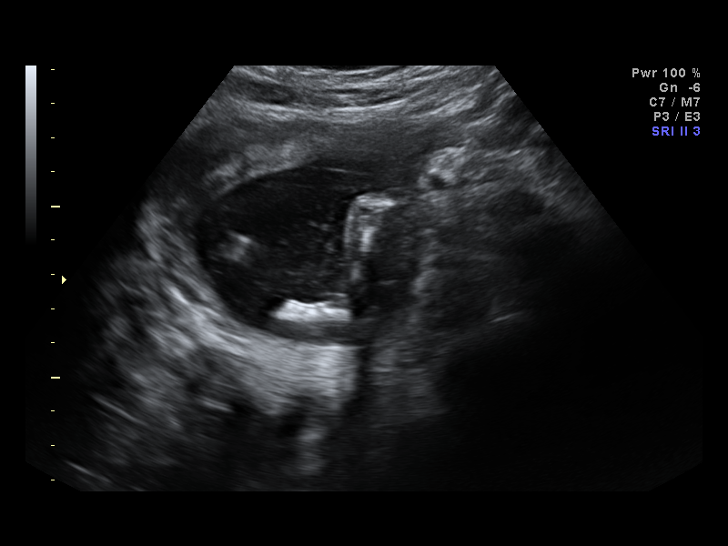
[im 68/108]
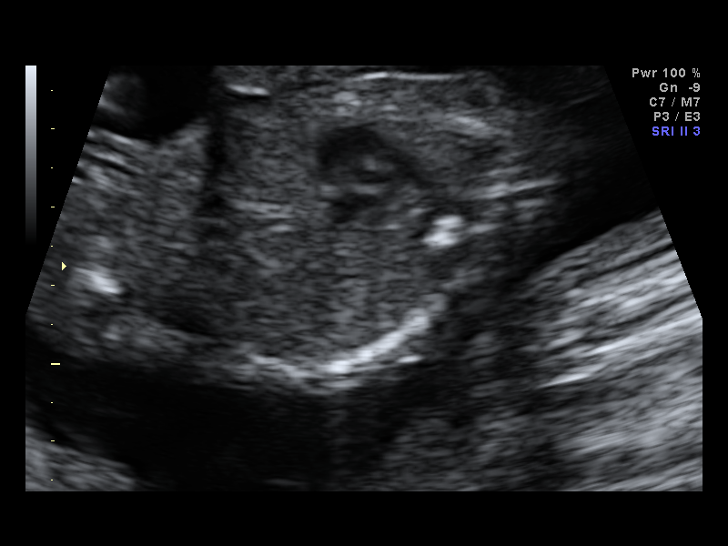
[im 76/108]
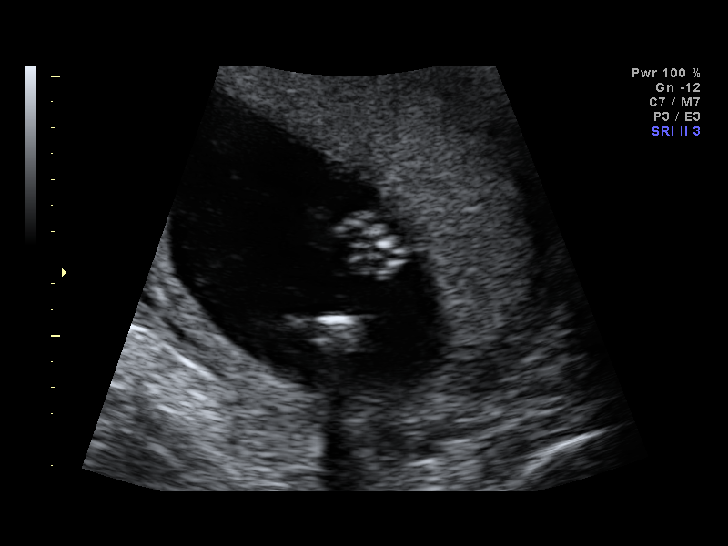
[im 84/108]
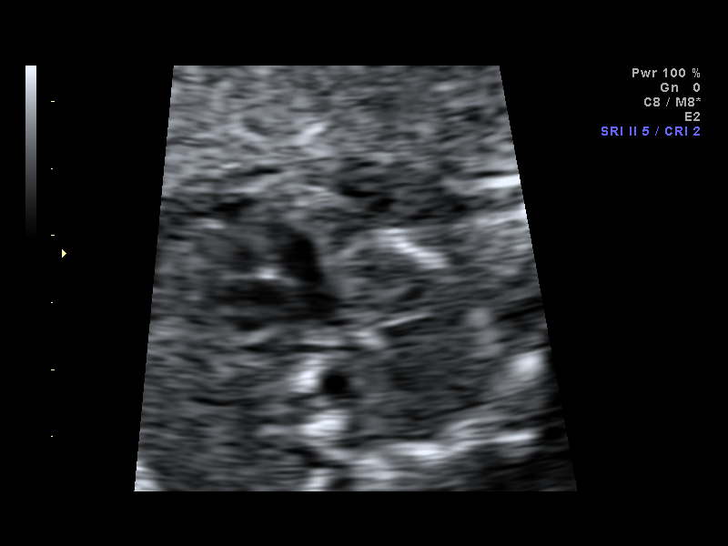
[im 92/108]
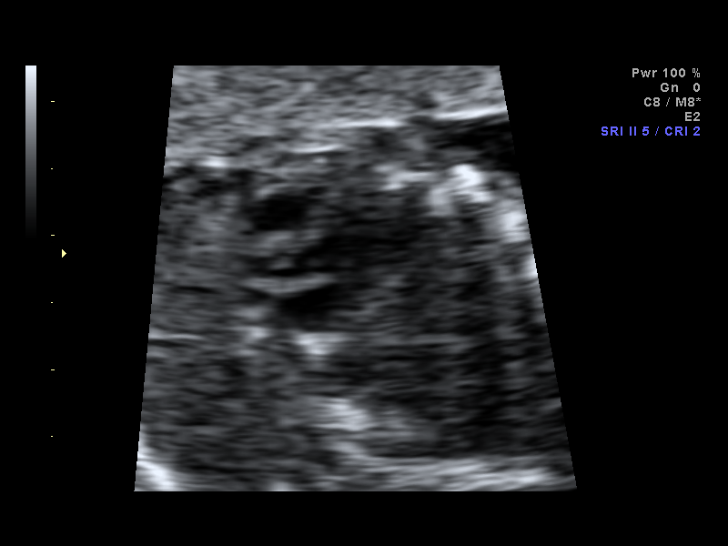
[im 100/108]
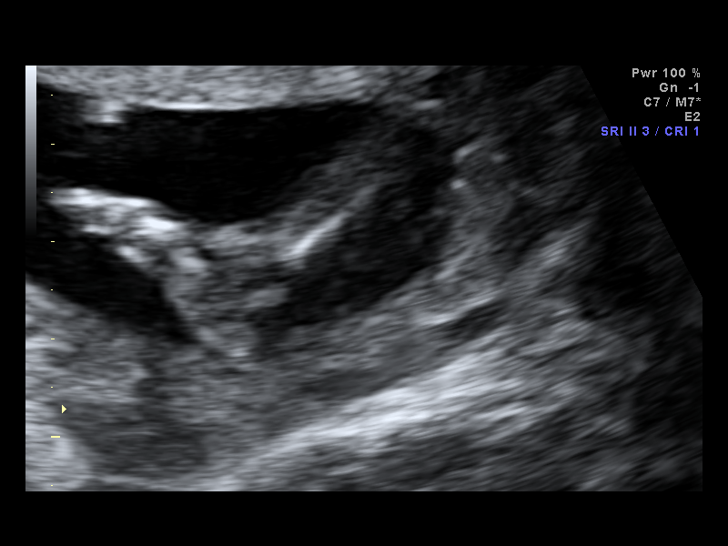
[im 108/108]
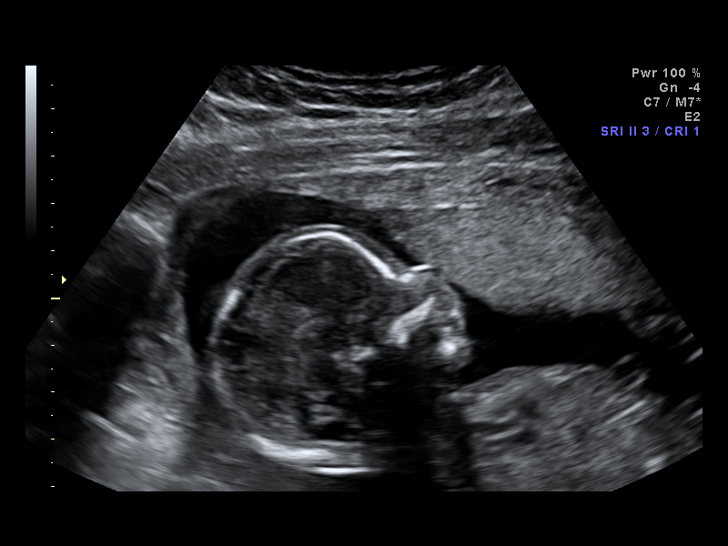

[14 of 28 positions shown; findings below may reference images not displayed]

IMPRESSION: [facility] has also been faxed to the ordering physician.

## 2010-06-28 ENCOUNTER — Ambulatory Visit (HOSPITAL_COMMUNITY): Admission: RE | Admit: 2010-06-28 | Discharge: 2010-06-28 | Payer: Self-pay | Admitting: Obstetrics & Gynecology

## 2010-06-28 IMAGING — US US OB FOLLOW-UP
2 series · 14 of 28 positions shown · non-contrast
Comparison: none

OBSTETRICAL ULTRASOUND:
 This ultrasound was performed in The [[facility]OSPITAL], and the [facility] report will be stored to [REDACTED] PACS.  This report is also available in [HOSPITAL]?s accessANYware.

[Series 1: us ob follow-up · 0.10mm/px · 4 of 10 slices shown (1 of 2)]
[im 2/10]
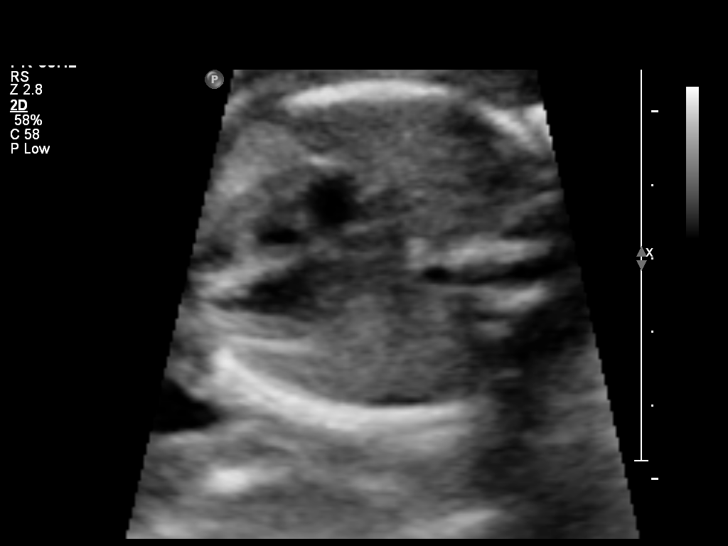
[im 4/10]
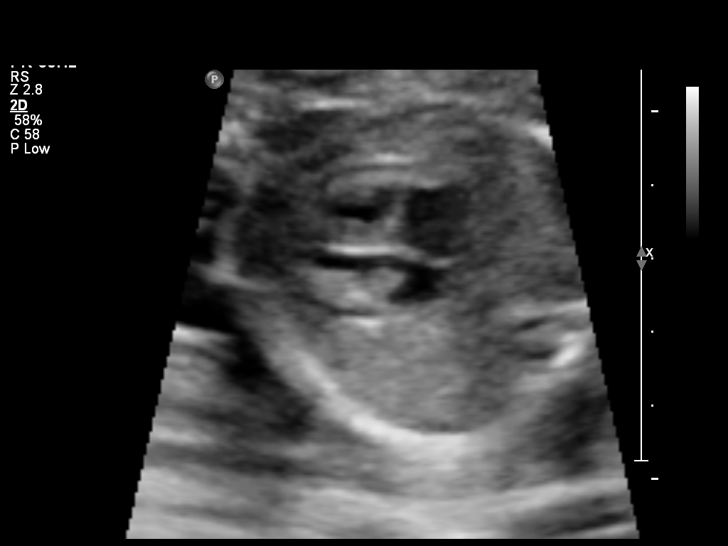
[im 7/10]
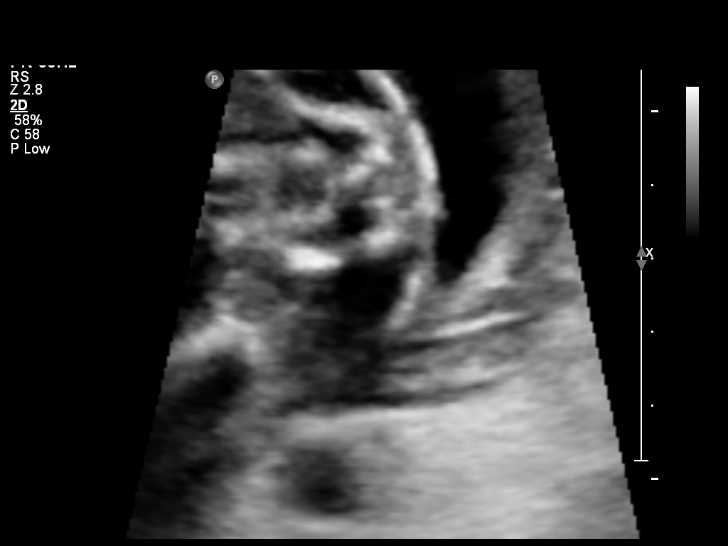
[im 10/10]
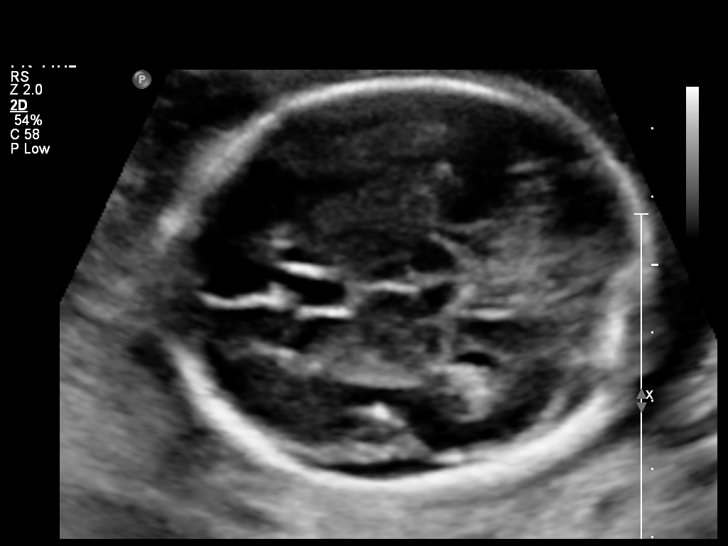

[Series 1: us ob follow-up · 0.24mm/px · 10 of 27 slices shown (2 of 2)]
[im 2/27]
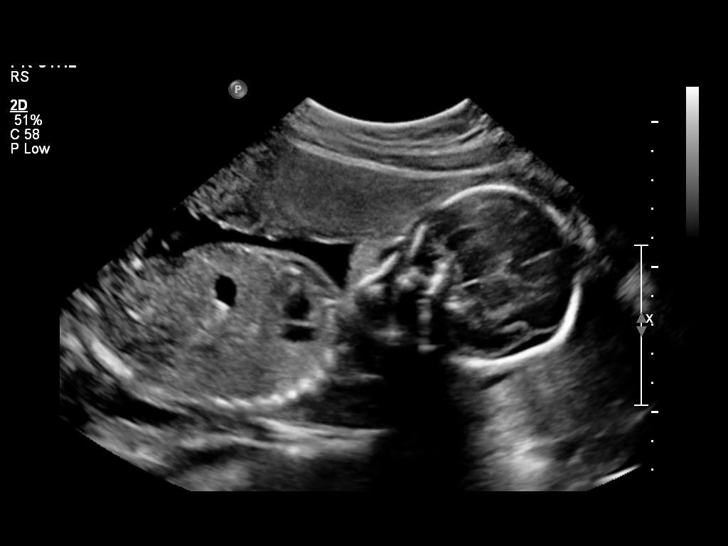
[im 5/27]
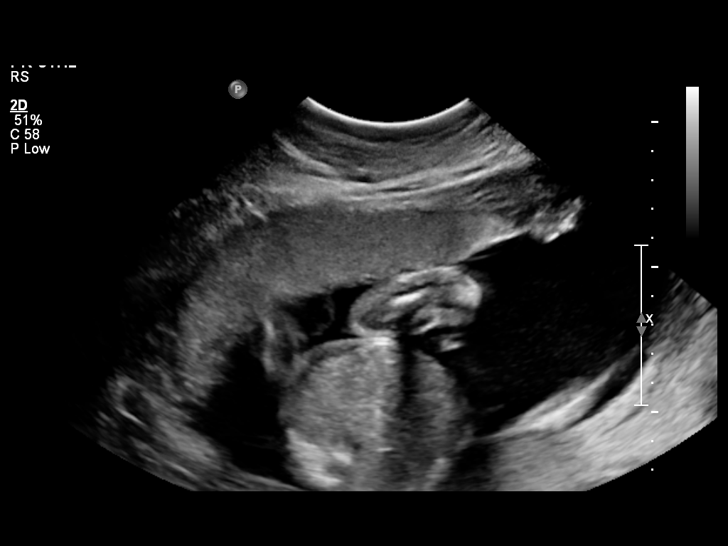
[im 7/27]
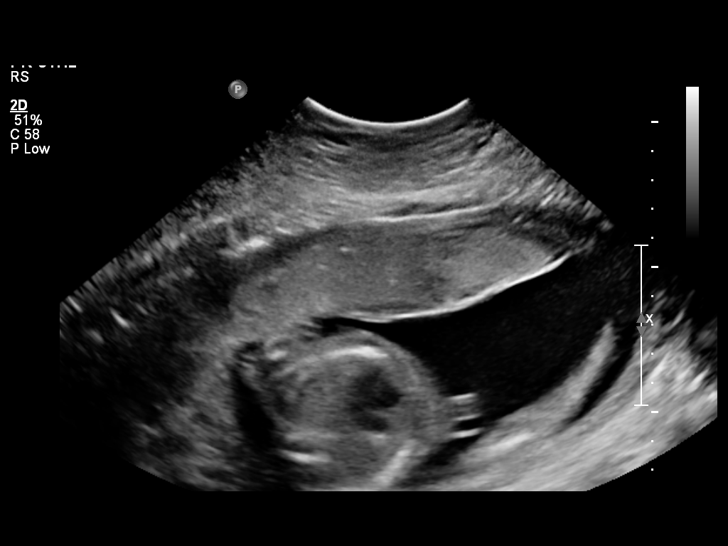
[im 10/27]
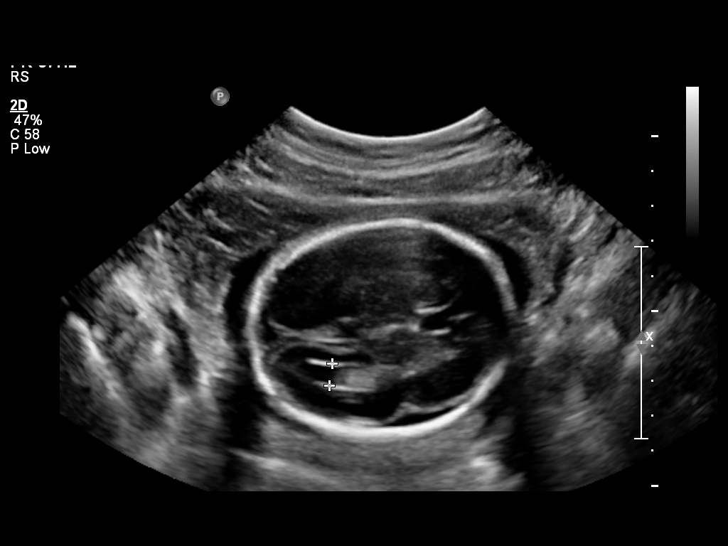
[im 13/27]
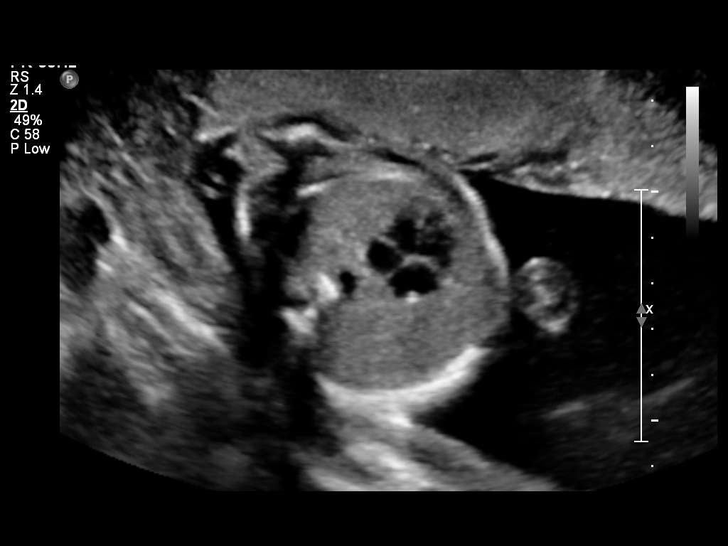
[im 16/27]
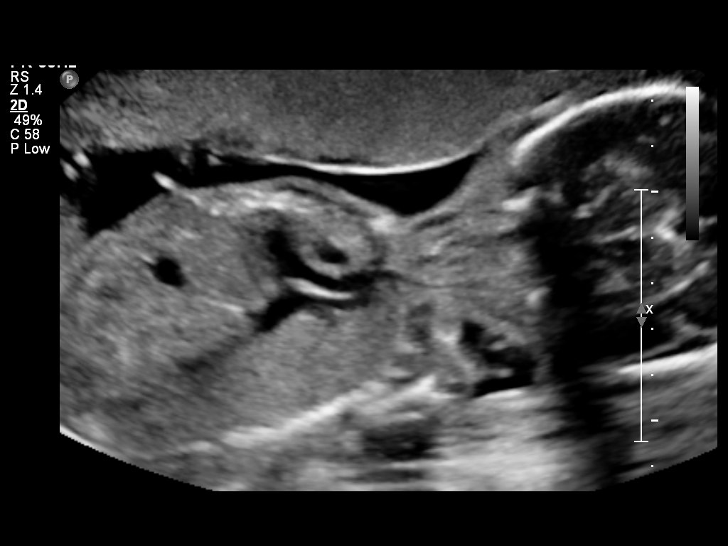
[im 18/27]
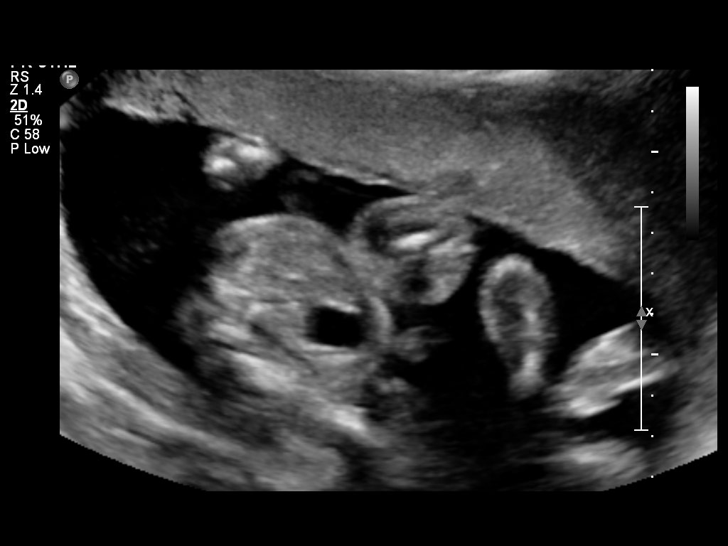
[im 21/27]
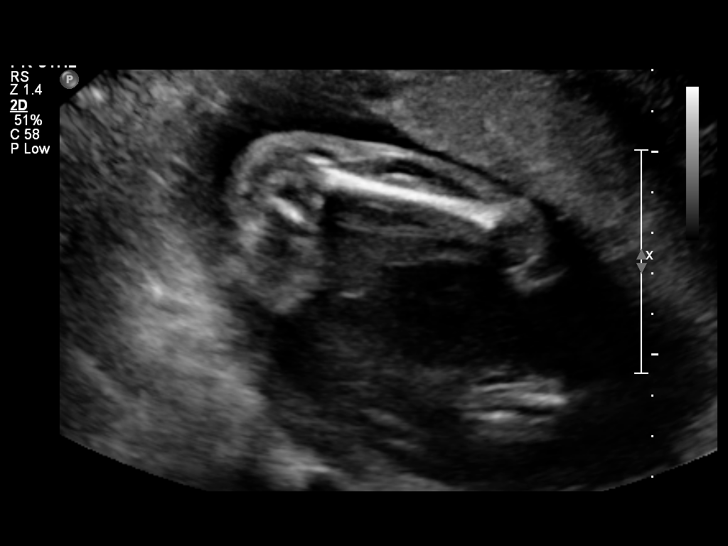
[im 24/27]
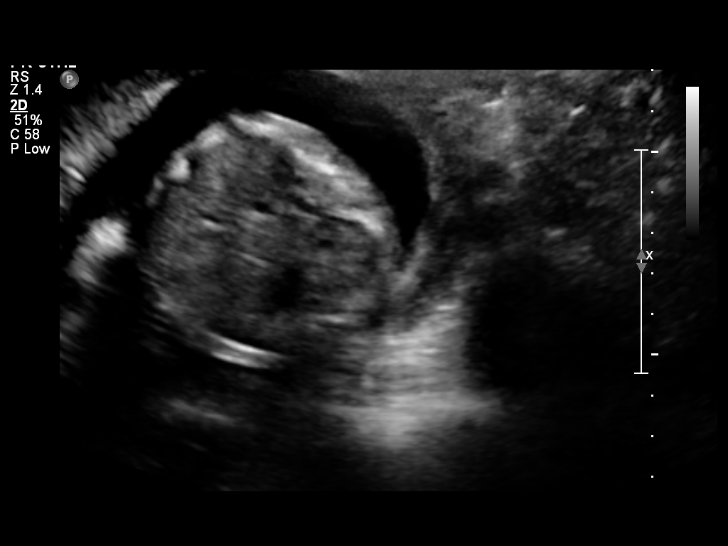
[im 27/27]
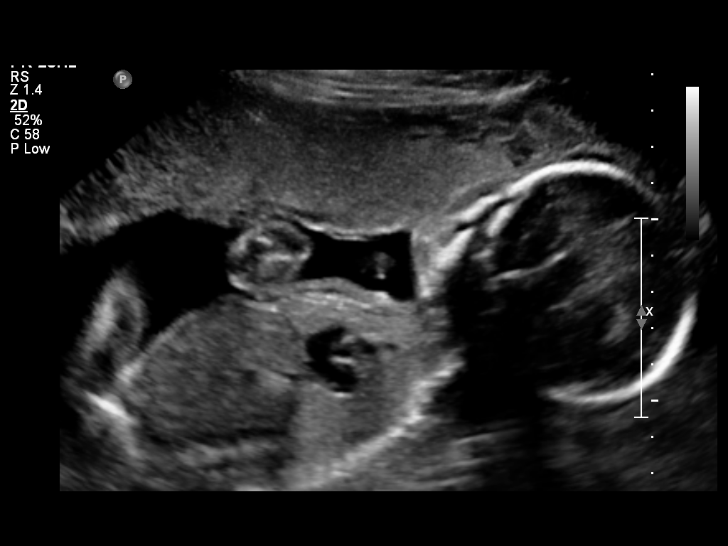

[14 of 28 positions shown; findings below may reference images not displayed]

IMPRESSION: [facility] has also been faxed to the ordering physician.

## 2010-08-12 ENCOUNTER — Ambulatory Visit (HOSPITAL_COMMUNITY): Admission: RE | Admit: 2010-08-12 | Discharge: 2010-08-12 | Payer: Self-pay | Admitting: Obstetrics & Gynecology

## 2010-08-12 IMAGING — US US OB FOLLOW-UP
1 series · 14 of 28 positions shown · non-contrast
Comparison: none

OBSTETRICAL ULTRASOUND:
 This ultrasound was performed in The [[facility]OSP[facility]], and the [facility] report will be stored to [REDACTED] PACS.  This report is also available in [HOSPITAL]?s accessANYware.

[Series 1: us ob follow-up · 34 acquisitions, 14 frames shown]
[im 2/34]
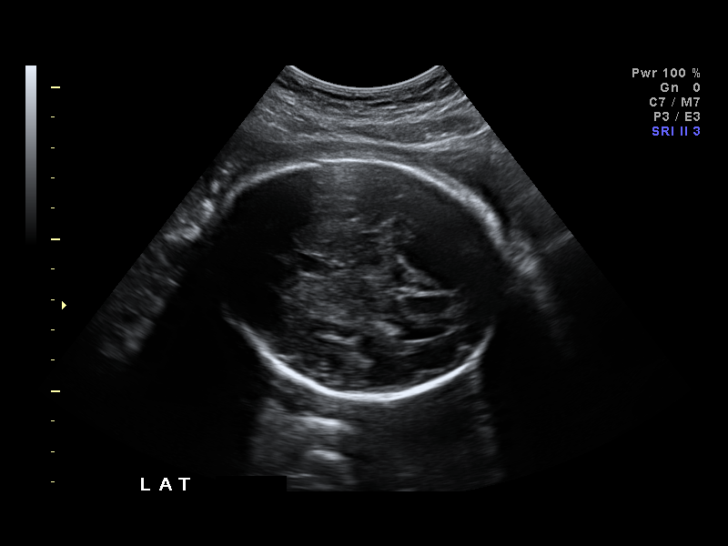
[im 4/34]
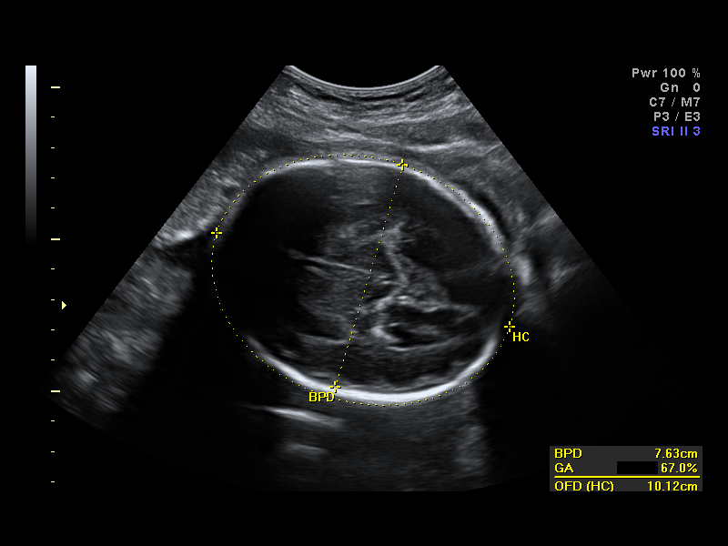
[im 7/34]
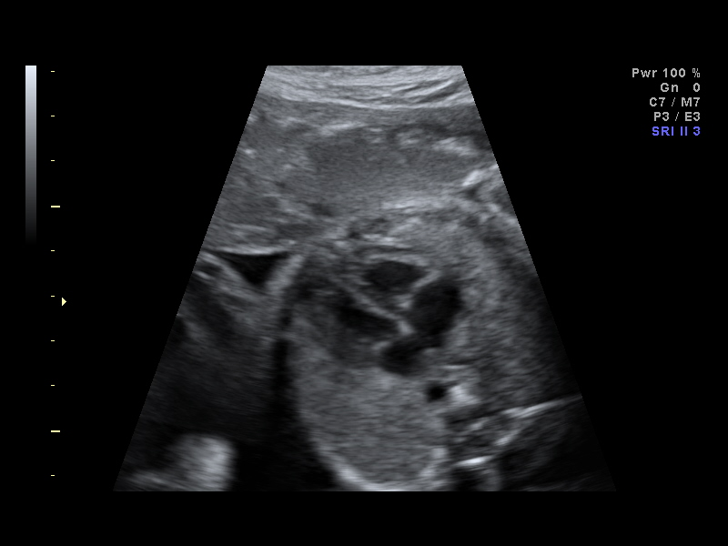
[im 9/34]
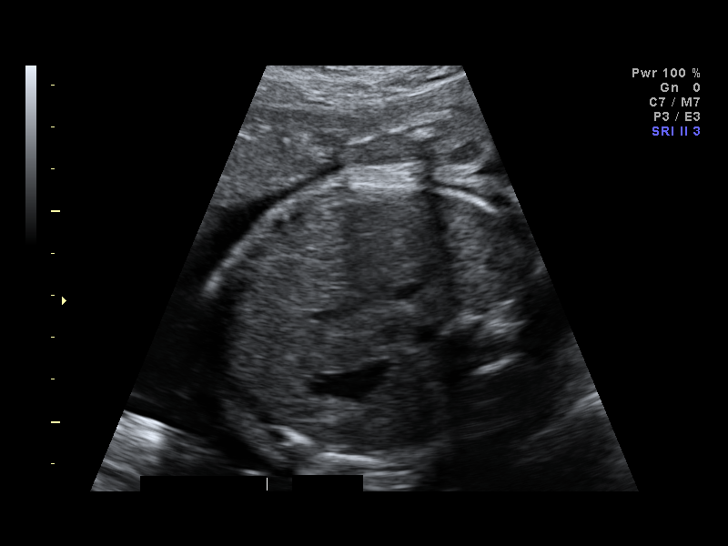
[im 12/34]
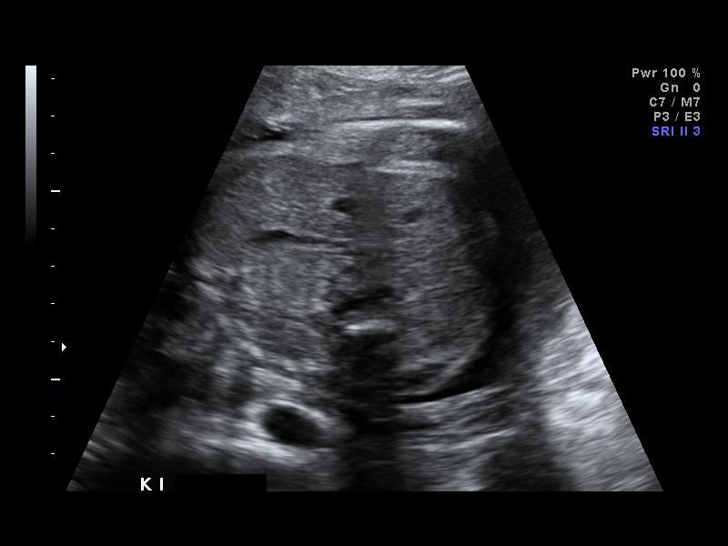
[im 14/34]
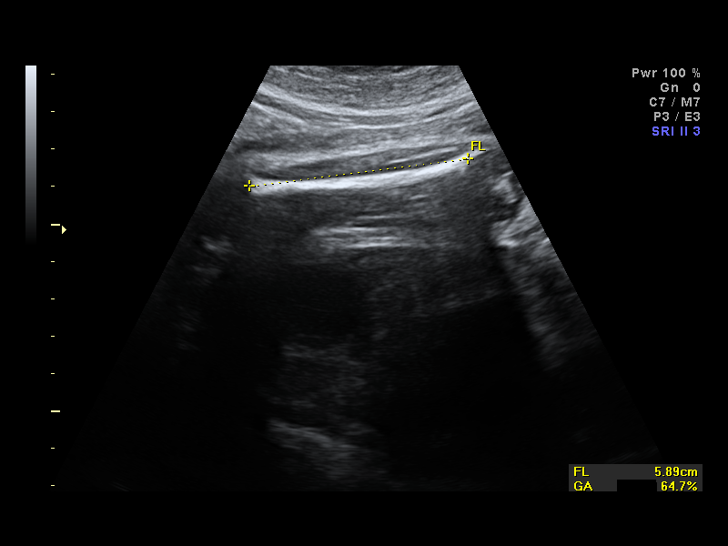
[im 16/34]
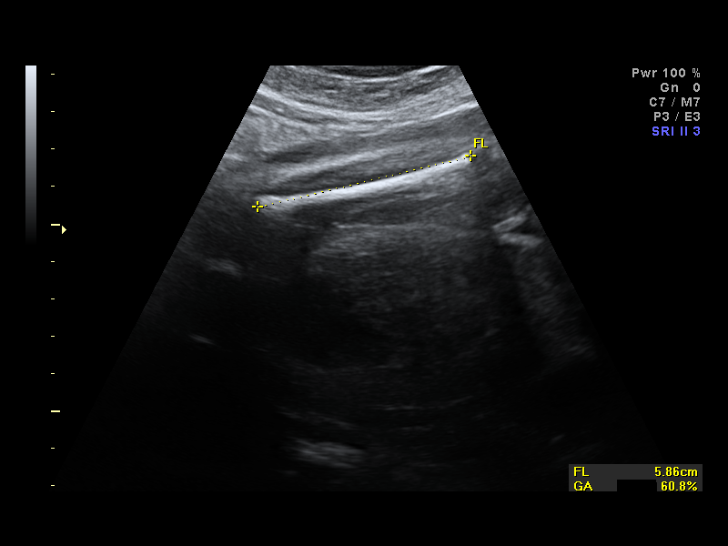
[im 19/34]
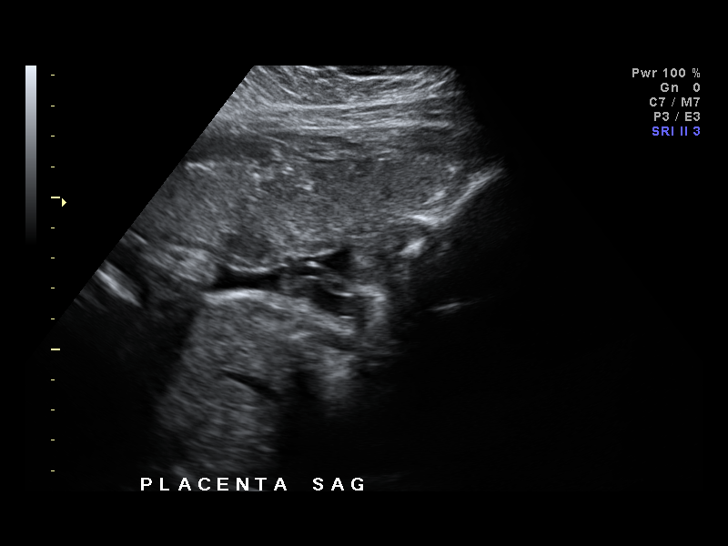
[im 21/34]
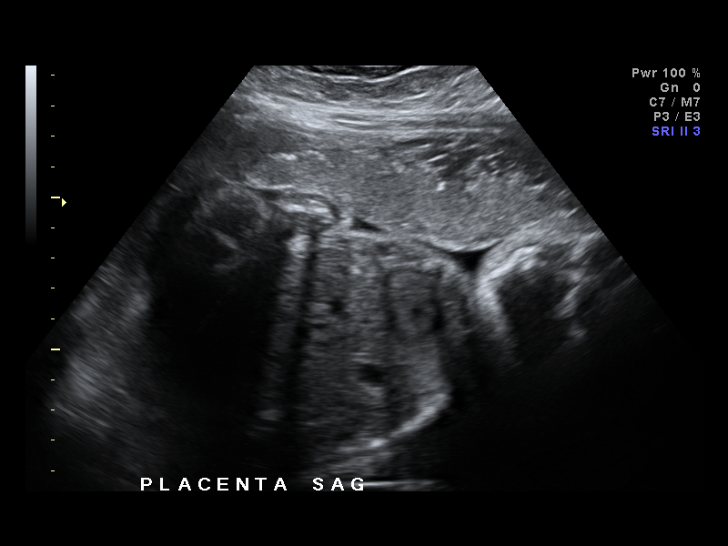
[im 24/34]
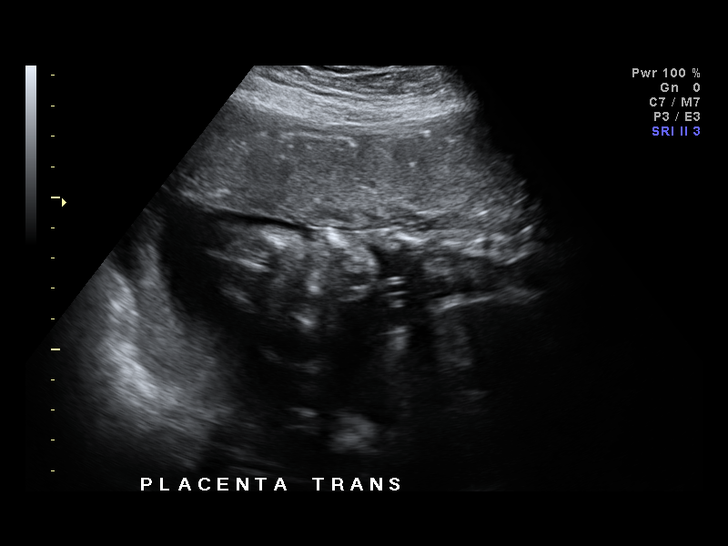
[im 26/34]
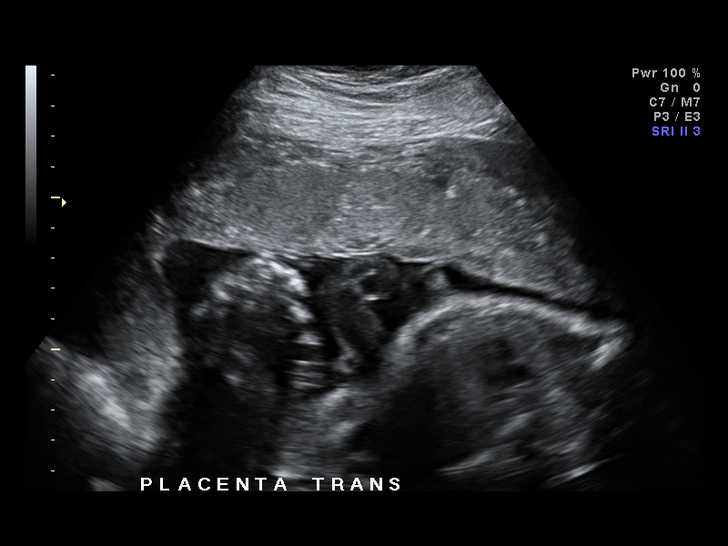
[im 29/34]
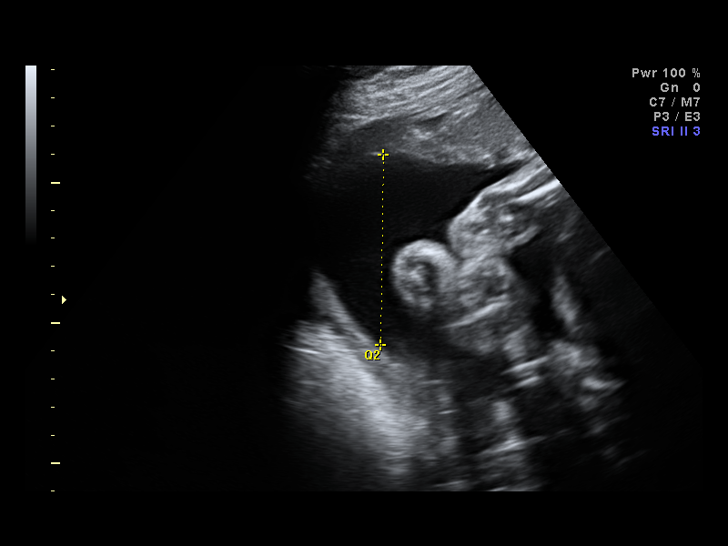
[im 31/34]
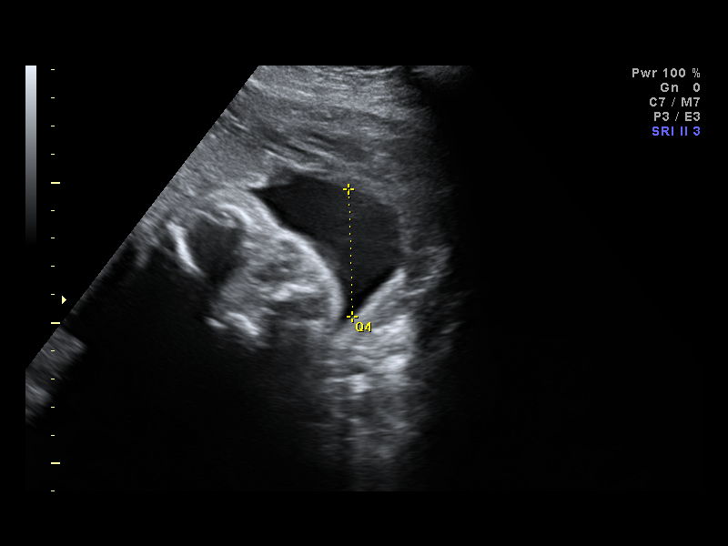
[im 34/34]
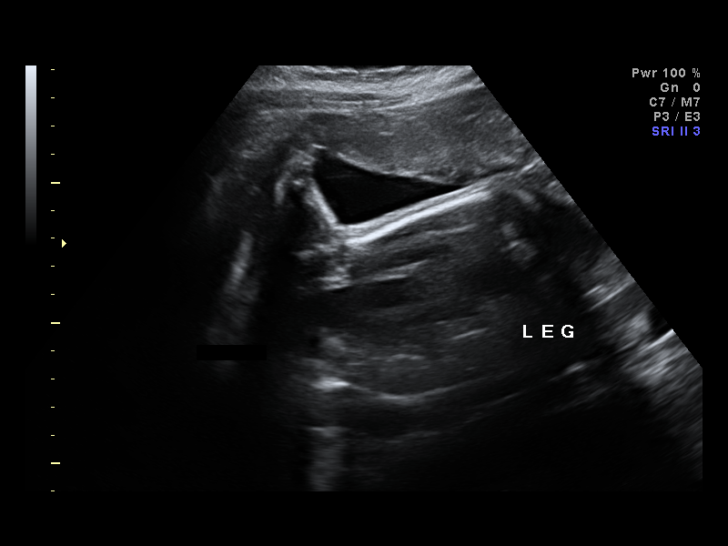

[14 of 28 positions shown; findings below may reference images not displayed]

IMPRESSION: [facility] has also been faxed to the ordering physician.

## 2010-09-02 ENCOUNTER — Ambulatory Visit: Payer: Self-pay | Admitting: Obstetrics and Gynecology

## 2010-09-02 ENCOUNTER — Inpatient Hospital Stay (HOSPITAL_COMMUNITY): Admission: AD | Admit: 2010-09-02 | Discharge: 2010-09-02 | Payer: Self-pay | Admitting: Obstetrics and Gynecology

## 2010-10-10 ENCOUNTER — Inpatient Hospital Stay (HOSPITAL_COMMUNITY): Admission: AD | Admit: 2010-10-10 | Discharge: 2010-10-12 | Payer: Self-pay | Admitting: Obstetrics & Gynecology

## 2010-12-24 ENCOUNTER — Encounter: Payer: Self-pay | Admitting: Obstetrics & Gynecology

## 2010-12-25 ENCOUNTER — Encounter: Payer: Self-pay | Admitting: Obstetrics & Gynecology

## 2011-02-14 LAB — CBC
HCT: 30.7 % — ABNORMAL LOW (ref 36.0–46.0)
HCT: 32.8 % — ABNORMAL LOW (ref 36.0–46.0)
Hemoglobin: 10.6 g/dL — ABNORMAL LOW (ref 12.0–15.0)
Hemoglobin: 11.3 g/dL — ABNORMAL LOW (ref 12.0–15.0)
MCH: 30.4 pg (ref 26.0–34.0)
MCH: 30.9 pg (ref 26.0–34.0)
MCHC: 34.3 g/dL (ref 30.0–36.0)
MCHC: 34.5 g/dL (ref 30.0–36.0)
MCV: 88.6 fL (ref 78.0–100.0)
MCV: 89.4 fL (ref 78.0–100.0)
Platelets: 208 10*3/uL (ref 150–400)
Platelets: 229 10*3/uL (ref 150–400)
RBC: 3.43 MIL/uL — ABNORMAL LOW (ref 3.87–5.11)
RBC: 3.71 MIL/uL — ABNORMAL LOW (ref 3.87–5.11)
RDW: 13.4 % (ref 11.5–15.5)
RDW: 13.6 % (ref 11.5–15.5)
WBC: 7.3 10*3/uL (ref 4.0–10.5)
WBC: 7.5 10*3/uL (ref 4.0–10.5)

## 2011-02-14 LAB — RPR: RPR Ser Ql: NONREACTIVE

## 2011-02-16 LAB — CBC
HCT: 30.3 % — ABNORMAL LOW (ref 36.0–46.0)
Hemoglobin: 10.6 g/dL — ABNORMAL LOW (ref 12.0–15.0)
MCH: 30.7 pg (ref 26.0–34.0)
MCHC: 35 g/dL (ref 30.0–36.0)
MCV: 87.7 fL (ref 78.0–100.0)
Platelets: 231 10*3/uL (ref 150–400)
RBC: 3.46 MIL/uL — ABNORMAL LOW (ref 3.87–5.11)
RDW: 13.3 % (ref 11.5–15.5)
WBC: 4.7 10*3/uL (ref 4.0–10.5)

## 2011-02-16 LAB — RAPID URINE DRUG SCREEN, HOSP PERFORMED
Amphetamines: NOT DETECTED
Barbiturates: NOT DETECTED
Benzodiazepines: NOT DETECTED
Cocaine: NOT DETECTED
Opiates: NOT DETECTED
Tetrahydrocannabinol: NOT DETECTED

## 2011-03-20 LAB — URINALYSIS, ROUTINE W REFLEX MICROSCOPIC
Bilirubin Urine: NEGATIVE
Glucose, UA: NEGATIVE mg/dL
Hgb urine dipstick: NEGATIVE
Ketones, ur: NEGATIVE mg/dL
Nitrite: NEGATIVE
Protein, ur: NEGATIVE mg/dL
Specific Gravity, Urine: 1.025 (ref 1.005–1.030)
Urobilinogen, UA: 0.2 mg/dL (ref 0.0–1.0)
pH: 6.5 (ref 5.0–8.0)

## 2011-03-20 LAB — CBC
HCT: 37.9 % (ref 36.0–46.0)
Hemoglobin: 12.9 g/dL (ref 12.0–15.0)
MCHC: 34 g/dL (ref 30.0–36.0)
MCV: 84.8 fL (ref 78.0–100.0)
Platelets: 287 10*3/uL (ref 150–400)
RBC: 4.47 MIL/uL (ref 3.87–5.11)
RDW: 12.9 % (ref 11.5–15.5)
WBC: 4.2 10*3/uL (ref 4.0–10.5)

## 2011-03-20 LAB — DIFFERENTIAL
Basophils Absolute: 0 10*3/uL (ref 0.0–0.1)
Basophils Relative: 0 % (ref 0–1)
Eosinophils Absolute: 0.1 10*3/uL (ref 0.0–0.7)
Eosinophils Relative: 3 % (ref 0–5)
Lymphocytes Relative: 22 % (ref 12–46)
Lymphs Abs: 0.9 10*3/uL (ref 0.7–4.0)
Monocytes Absolute: 0.2 10*3/uL (ref 0.1–1.0)
Monocytes Relative: 4 % (ref 3–12)
Neutro Abs: 3 10*3/uL (ref 1.7–7.7)
Neutrophils Relative %: 71 % (ref 43–77)

## 2011-03-20 LAB — COMPREHENSIVE METABOLIC PANEL
ALT: 23 U/L (ref 0–35)
AST: 24 U/L (ref 0–37)
Albumin: 3.4 g/dL — ABNORMAL LOW (ref 3.5–5.2)
Alkaline Phosphatase: 65 U/L (ref 39–117)
BUN: 11 mg/dL (ref 6–23)
CO2: 23 mEq/L (ref 19–32)
Calcium: 9.2 mg/dL (ref 8.4–10.5)
Chloride: 105 mEq/L (ref 96–112)
Creatinine, Ser: 0.61 mg/dL (ref 0.4–1.2)
GFR calc Af Amer: >60 mL/min (ref >60)
GFR calc non Af Amer: >60 mL/min (ref >60)
Glucose, Bld: 124 mg/dL — ABNORMAL HIGH (ref 70–99)
Potassium: 3.6 mEq/L (ref 3.5–5.1)
Sodium: 139 mEq/L (ref 135–145)
Total Bilirubin: 0.3 mg/dL (ref 0.3–1.2)
Total Protein: 7 g/dL (ref 6.0–8.3)

## 2011-03-20 LAB — RAPID STREP SCREEN (MED CTR MEBANE ONLY): Streptococcus, Group A Screen (Direct): NEGATIVE

## 2011-09-05 LAB — DIFFERENTIAL
Basophils Absolute: 0
Basophils Relative: 1
Eosinophils Absolute: 0.1
Eosinophils Relative: 3
Lymphocytes Relative: 31
Lymphs Abs: 1.6
Monocytes Absolute: 0.3
Monocytes Relative: 6
Neutro Abs: 3
Neutrophils Relative %: 60

## 2011-09-05 LAB — CBC
HCT: 37.6
Hemoglobin: 13.1
MCHC: 34.7
MCV: 84.6
Platelets: 258
RBC: 4.45
RDW: 12.7
WBC: 5

## 2011-09-05 LAB — BASIC METABOLIC PANEL
BUN: 12
CO2: 25
Calcium: 9.5
Chloride: 105
Creatinine, Ser: 0.66
GFR calc Af Amer: >60
GFR calc non Af Amer: >60
Glucose, Bld: 96
Potassium: 3.9
Sodium: 137

## 2011-09-05 LAB — SEDIMENTATION RATE: Sed Rate: 30 — ABNORMAL HIGH

## 2012-01-23 ENCOUNTER — Other Ambulatory Visit (HOSPITAL_COMMUNITY)
Admission: RE | Admit: 2012-01-23 | Discharge: 2012-01-23 | Disposition: A | Discharge status: Home / Self Care | Payer: Medicaid Other | Source: Ambulatory Visit | Attending: Obstetrics and Gynecology | Admitting: Obstetrics and Gynecology

## 2012-01-23 ENCOUNTER — Other Ambulatory Visit: Payer: Self-pay | Admitting: Adult Health

## 2012-01-23 DIAGNOSIS — Z113 Encounter for screening for infections with a predominantly sexual mode of transmission: Secondary | ICD-10-CM | POA: Insufficient documentation

## 2012-01-23 DIAGNOSIS — Z01419 Encounter for gynecological examination (general) (routine) without abnormal findings: Secondary | ICD-10-CM | POA: Insufficient documentation

## 2012-03-19 ENCOUNTER — Other Ambulatory Visit: Payer: Self-pay | Admitting: Obstetrics & Gynecology

## 2012-05-30 ENCOUNTER — Other Ambulatory Visit: Payer: Self-pay | Admitting: Surgery

## 2012-11-21 ENCOUNTER — Other Ambulatory Visit: Payer: Self-pay | Admitting: Obstetrics & Gynecology

## 2012-12-13 ENCOUNTER — Encounter (HOSPITAL_COMMUNITY): Payer: Self-pay | Admitting: Pharmacy Technician

## 2012-12-13 ENCOUNTER — Other Ambulatory Visit: Payer: Self-pay | Admitting: Obstetrics & Gynecology

## 2012-12-13 ENCOUNTER — Encounter (HOSPITAL_COMMUNITY): Payer: Self-pay

## 2012-12-13 ENCOUNTER — Encounter (HOSPITAL_COMMUNITY)
Admission: RE | Admit: 2012-12-13 | Discharge: 2012-12-13 | Disposition: A | Discharge status: Home / Self Care | Payer: BC Managed Care – PPO | Source: Ambulatory Visit | Attending: Obstetrics & Gynecology | Admitting: Obstetrics & Gynecology

## 2012-12-13 HISTORY — DX: Unspecified convulsions: R56.9

## 2012-12-13 HISTORY — DX: Gastro-esophageal reflux disease without esophagitis: K21.9

## 2012-12-13 LAB — CBC
HCT: 38 % (ref 36.0–46.0)
Hemoglobin: 12.8 g/dL (ref 12.0–15.0)
MCH: 31.1 pg (ref 26.0–34.0)
MCHC: 33.7 g/dL (ref 30.0–36.0)
MCV: 92.2 fL (ref 78.0–100.0)
Platelets: 236 10*3/uL (ref 150–400)
RBC: 4.12 MIL/uL (ref 3.87–5.11)
RDW: 12.9 % (ref 11.5–15.5)
WBC: 3.3 10*3/uL — ABNORMAL LOW (ref 4.0–10.5)

## 2012-12-13 LAB — URINE MICROSCOPIC-ADD ON

## 2012-12-13 LAB — COMPREHENSIVE METABOLIC PANEL
ALT: 32 U/L (ref 0–35)
AST: 30 U/L (ref 0–37)
Albumin: 4.3 g/dL (ref 3.5–5.2)
Alkaline Phosphatase: 88 U/L (ref 39–117)
BUN: 11 mg/dL (ref 6–23)
CO2: 26 mEq/L (ref 19–32)
Calcium: 10.1 mg/dL (ref 8.4–10.5)
Chloride: 101 mEq/L (ref 96–112)
Creatinine, Ser: 0.79 mg/dL (ref 0.50–1.10)
GFR calc Af Amer: >90 mL/min (ref >90)
GFR calc non Af Amer: >90 mL/min (ref >90)
Glucose, Bld: 81 mg/dL (ref 70–99)
Potassium: 4.1 mEq/L (ref 3.5–5.1)
Sodium: 137 mEq/L (ref 135–145)
Total Bilirubin: 0.4 mg/dL (ref 0.3–1.2)
Total Protein: 7.8 g/dL (ref 6.0–8.3)

## 2012-12-13 LAB — URINALYSIS, ROUTINE W REFLEX MICROSCOPIC
Bilirubin Urine: NEGATIVE
Glucose, UA: NEGATIVE mg/dL
Hgb urine dipstick: NEGATIVE
Ketones, ur: NEGATIVE mg/dL
Nitrite: NEGATIVE
Protein, ur: NEGATIVE mg/dL
Specific Gravity, Urine: >1.03 — ABNORMAL HIGH (ref 1.005–1.030)
Urobilinogen, UA: 0.2 mg/dL (ref 0.0–1.0)
pH: 5.5 (ref 5.0–8.0)

## 2012-12-13 LAB — SURGICAL PCR SCREEN
MRSA, PCR: NEGATIVE
Staphylococcus aureus: NEGATIVE

## 2012-12-13 LAB — HCG, QUANTITATIVE, PREGNANCY: hCG, Beta Chain, Quant, S: <1 m[IU]/mL (ref <5)

## 2012-12-13 MED ORDER — KETOROLAC TROMETHAMINE 30 MG/ML IJ SOLN: 30.0000 mg | Freq: Once | INTRAMUSCULAR | Status: DC

## 2012-12-13 NOTE — Patient Instructions (Addendum)
Carolyna Yerian Kathman  12/13/2012   Your procedure is scheduled on:   12/20/2012  Report to [facility] at  615  AM.  Call this number if you have problems the morning of surgery: [phone]   Remember:   Do not eat food or drink liquids after midnight.   Take these medicines the morning of surgery with A SIP OF WATER: none   Do not wear jewelry, make-up or nail polish.  Do not wear lotions, powders, or perfumes.   Do not shave 48 hours prior to surgery. Men may shave face and neck.  Do not bring valuables to the hospital.  Contacts, dentures or bridgework may not be worn into surgery.  Leave suitcase in the car. After surgery it may be brought to your room.  For patients admitted to the hospital, checkout time is 11:00 AM the day of discharge.   Patients discharged the day of surgery will not be allowed to drive  home.  Name and phone number of your driver: family  Special Instructions: Shower using CHG 2 nights before surgery and the night before surgery.  If you shower the day of surgery use CHG.  Use special wash - you have one bottle of CHG for all showers.  You should use approximately 1/3 of the bottle for each shower.   Please read over the following fact sheets that you were given: Pain Booklet, Coughing and Deep Breathing, MRSA Information, Surgical Site Infection Prevention, Anesthesia Post-op Instructions and Care and Recovery After Surgery Conization of the Cervix Conization is the cutting (excision) of a cone-shaped portion of the cervix. This procedure is usually done when there is abnormal bleeding from the cervix. It can also be done to evaluate an abnormal Pap smear or if an abnormality is seen on the cervix during an exam. Conization of the cervix is not done during a menstrual period or pregnancy. BEFORE THE PROCEDURE  Do not eat or drink anything for 6 to 8 hours before the procedure, especially if you are going to be given a drug to make you sleep (general  anesthetic).  Do not take aspirin or blood thinners for at least a week before the procedure, or as directed.  Arrive at least an hour before the procedure to read and sign the necessary forms.  Arrange for someone to take you home after the procedure.  If you smoke, do not smoke for 2 weeks before the procedure. LET YOUR CAREGIVER KNOW THE FOLLOWING:  Allergies to food or medications.  All the medications you are taking including over-the-counter and prescription medications, herbs, eyedrops, and creams.  You develop a cold or an infection.  If you are using illegal drugs or drinking too much alcohol.  Your smoking habits.  Previous problems with anesthetics including novocaine.  The possibility of being pregnant.  History of blood clots or other bleeding problems.  Other medical problems. RISKS AND COMPLICATIONS   Bleeding.  Infection.  Damage to the cervix.  Injury to surrounding organs.  Problems with the anesthesia. PROCEDURE Conization of the cervix can be done by:  Cold knife. This type cuts out the cervical canal and the transformation zone (where the normal cells end and the abnormal cells begin) with a scalpel.  LEEP (electrocautery). This type is done with a thin wire that can cut and burn (cauterize) the cervical tissue with an electrical current.  Laser treatment. This type cuts and burns (cauterizes) the tissue of the cervix to  prevent bleeding with a laser beam. You will be given a drug to make you sleep (general anesthetic) for cold knife and laser treatments, and you will be given a numbing medication (local anesthetic) for the LEEP procedure. Conization is usually done using colposcopy. Colposcopy magnifies the cervix to see it more clearly. The tissue removed is examined under a microscope by a doctor Psychologist, educational). The pathologist will provide a report to your caregiver. This will help your caregiver decide if further treatment is necessary. This  report will also help your caregiver decide on the best treatment if your results are abnormal.  AFTER THE PROCEDURE  If you had a general anesthetic, you may be groggy for a couple of hours after the procedure.  If you had a local anesthetic, you will be advised to rest at the surgical center or caregiver's office until you are stable and feel ready to go home.  Have someone take you home.  You may have some cramping for a couple of days.  You may have a bloody discharge or light bleeding for 2 to 4 weeks.  You may have a black discharge coming from the vagina. This is from the paste used on the cervix to prevent bleeding. This is normal discharge. HOME CARE INSTRUCTIONS   Do not drive for 24 hours.  Avoid strenuous activities and exercises for at least 7 - 10 days.  Only take over-the-counter or prescription medicines for pain, discomfort, or fever as directed by your caregiver.  Do not take aspirin. It can cause or aggravate bleeding.  You may resume your usual diet.  Drink 6 to 8 glasses of fluid a day.  Rest and sleep the first 24 to 48 hours.  Take showers instead of baths until your caregiver gives you the okay.  Do not use tampons, douche or have intercourse for 4 weeks, or as advised by your caregiver.  Do not lift anything over 10 pounds (4.5 kg) for at least 7 to 10 days, or as advised by your caregiver.  Take your temperature twice a day, for 4 to 5 days. Write them down.  Do not drink or drive when taking pain medication.  If you develop constipation:  Take a mild laxative with the advice of your caregiver.  Eat bran foods.  Drink a lot of fluids.  Try to have someone with you or available for you the first 24 to 48 hours, especially if you had a general anesthetic.  Make sure you and your family understand everything about your surgery and recovery.  Follow your caregiver's advice regarding follow-up appointments and Pap smears. SEEK MEDICAL CARE IF:    You develop a rash.  You are dizzy or light-headed.  You feel sick to your stomach (nauseous).  You develop a bad smelling vaginal discharge.  You have an abnormal reaction or allergy to your medication.  You need stronger pain medication. SEEK IMMEDIATE MEDICAL CARE IF:   You have blood clots or bleeding that is heavier than a normal menstrual period, or you develop bright red bleeding.  An oral temperature above 102 F (38.9 C) develops and persists for several hours.  You have increasing cramps.  You pass out.  You have painful or bloody urine.  You start throwing up (vomiting).  The pain is not relieved with your pain medication. Not all test results are available during your visit. If your test results are not back during your the visit, make an appointment with your caregiver to find  out the results. Do not assume everything is normal if you have not heard from your caregiver or the medical facility. It is important for you to follow up on all of your test results. Document Released: 08/30/2005 Document Revised: 02/12/2012 Document Reviewed: 06/21/2009 [facility] Patient Information 2013 [place], [place]. PATIENT INSTRUCTIONS POST-ANESTHESIA  IMMEDIATELY FOLLOWING SURGERY:  Do not drive or operate machinery for the first twenty four hours after surgery.  Do not make any important decisions for twenty four hours after surgery or while taking narcotic pain medications or sedatives.  If you develop intractable nausea and vomiting or a severe headache please notify your doctor immediately.  FOLLOW-UP:  Please make an appointment with your surgeon as instructed. You do not need to follow up with anesthesia unless specifically instructed to do so.  WOUND CARE INSTRUCTIONS (if applicable):  Keep a dry clean dressing on the anesthesia/puncture wound site if there is drainage.  Once the wound has quit draining you may leave it open to air.  Generally you should leave the bandage  intact for twenty four hours unless there is drainage.  If the epidural site drains for more than 36-48 hours please call the anesthesia department.  QUESTIONS?:  Please feel free to call your physician or the hospital operator if you have any questions, and they will be happy to assist you.

## 2012-12-16 ENCOUNTER — Encounter (HOSPITAL_COMMUNITY): Payer: BC Managed Care – PPO

## 2012-12-20 ENCOUNTER — Encounter (HOSPITAL_COMMUNITY)
Admission: RE | Disposition: A | Discharge status: Home / Self Care | Payer: Self-pay | Source: Ambulatory Visit | Attending: Obstetrics & Gynecology

## 2012-12-20 ENCOUNTER — Encounter (HOSPITAL_COMMUNITY): Payer: Self-pay | Admitting: Anesthesiology

## 2012-12-20 ENCOUNTER — Ambulatory Visit (HOSPITAL_COMMUNITY): Payer: BC Managed Care – PPO | Admitting: Anesthesiology

## 2012-12-20 ENCOUNTER — Encounter (HOSPITAL_COMMUNITY): Payer: Self-pay

## 2012-12-20 ENCOUNTER — Ambulatory Visit (HOSPITAL_COMMUNITY)
Admission: RE | Admit: 2012-12-20 | Discharge: 2012-12-20 | Disposition: A | Discharge status: Home / Self Care | Payer: BC Managed Care – PPO | Source: Ambulatory Visit | Attending: Obstetrics & Gynecology | Admitting: Obstetrics & Gynecology

## 2012-12-20 ENCOUNTER — Other Ambulatory Visit: Payer: Self-pay | Admitting: Obstetrics & Gynecology

## 2012-12-20 DIAGNOSIS — D069 Carcinoma in situ of cervix, unspecified: Secondary | ICD-10-CM | POA: Insufficient documentation

## 2012-12-20 DIAGNOSIS — Z01812 Encounter for preprocedural laboratory examination: Secondary | ICD-10-CM | POA: Insufficient documentation

## 2012-12-20 DIAGNOSIS — Z9889 Other specified postprocedural states: Secondary | ICD-10-CM

## 2012-12-20 HISTORY — PX: HOLMIUM LASER APPLICATION: SHX5852

## 2012-12-20 HISTORY — PX: CERVICAL CONIZATION W/BX: SHX1330

## 2012-12-20 SURGERY — CONE BIOPSY, CERVIX
Anesthesia: General | Wound class: Clean Contaminated

## 2012-12-20 MED ORDER — MIDAZOLAM HCL 2 MG/2ML IJ SOLN
INTRAMUSCULAR | Status: AC
  Filled 2012-12-20: qty 2

## 2012-12-20 MED ORDER — DEXAMETHASONE SODIUM PHOSPHATE 4 MG/ML IJ SOLN
INTRAMUSCULAR | Status: AC
  Filled 2012-12-20: qty 1

## 2012-12-20 MED ORDER — SUCCINYLCHOLINE CHLORIDE 20 MG/ML IJ SOLN
INTRAMUSCULAR | Status: DC | PRN
  Administered 2012-12-20: 100 mg via INTRAVENOUS

## 2012-12-20 MED ORDER — ACETIC ACID 5 % SOLN
Status: DC | PRN
  Administered 2012-12-20: 1 via TOPICAL

## 2012-12-20 MED ORDER — LABETALOL HCL 5 MG/ML IV SOLN
10.0000 mg | Freq: Once | INTRAVENOUS | Status: AC
  Administered 2012-12-20: 10 mg via INTRAVENOUS

## 2012-12-20 MED ORDER — MIDAZOLAM HCL 2 MG/2ML IJ SOLN
1.0000 mg | INTRAMUSCULAR | Status: DC | PRN
  Administered 2012-12-20: 2 mg via INTRAVENOUS

## 2012-12-20 MED ORDER — ROCURONIUM BROMIDE 50 MG/5ML IV SOLN
INTRAVENOUS | Status: AC
  Filled 2012-12-20: qty 1

## 2012-12-20 MED ORDER — ONDANSETRON HCL 4 MG/2ML IJ SOLN: 4.0000 mg | Freq: Once | INTRAMUSCULAR | Status: DC | PRN

## 2012-12-20 MED ORDER — DEXAMETHASONE SODIUM PHOSPHATE 10 MG/ML IJ SOLN
INTRAMUSCULAR | Status: AC
  Filled 2012-12-20: qty 1

## 2012-12-20 MED ORDER — LABETALOL HCL 5 MG/ML IV SOLN
INTRAVENOUS | Status: AC
  Filled 2012-12-20: qty 4

## 2012-12-20 MED ORDER — DIPHENHYDRAMINE HCL 50 MG/ML IJ SOLN
25.0000 mg | Freq: Once | INTRAMUSCULAR | Status: AC
  Administered 2012-12-20: 25 mg via INTRAVENOUS

## 2012-12-20 MED ORDER — DIPHENHYDRAMINE HCL 50 MG/ML IJ SOLN
INTRAMUSCULAR | Status: AC
  Filled 2012-12-20: qty 1

## 2012-12-20 MED ORDER — FERRIC SUBSULFATE 259 MG/GM EX SOLN
CUTANEOUS | Status: AC
  Filled 2012-12-20: qty 8

## 2012-12-20 MED ORDER — LIDOCAINE HCL (CARDIAC) 20 MG/ML IV SOLN
INTRAVENOUS | Status: DC | PRN
  Administered 2012-12-20: 40 mg via INTRAVENOUS

## 2012-12-20 MED ORDER — CLINDAMYCIN PHOSPHATE 900 MG/50ML IV SOLN
INTRAVENOUS | Status: AC
  Filled 2012-12-20: qty 50

## 2012-12-20 MED ORDER — CLINDAMYCIN PHOSPHATE 900 MG/50ML IV SOLN
900.0000 mg | INTRAVENOUS | Status: AC
  Administered 2012-12-20: 900 mg via INTRAVENOUS

## 2012-12-20 MED ORDER — LIDOCAINE HCL (PF) 1 % IJ SOLN
INTRAMUSCULAR | Status: AC
  Filled 2012-12-20: qty 5

## 2012-12-20 MED ORDER — ONDANSETRON HCL 8 MG PO TABS: 8.0000 mg | ORAL_TABLET | Freq: Three times a day (TID) | ORAL | Status: DC | PRN

## 2012-12-20 MED ORDER — CIPROFLOXACIN IN D5W 400 MG/200ML IV SOLN
INTRAVENOUS | Status: AC
  Filled 2012-12-20: qty 200

## 2012-12-20 MED ORDER — SUCCINYLCHOLINE CHLORIDE 20 MG/ML IJ SOLN
INTRAMUSCULAR | Status: AC
  Filled 2012-12-20: qty 1

## 2012-12-20 MED ORDER — FENTANYL CITRATE 0.05 MG/ML IJ SOLN
25.0000 ug | INTRAMUSCULAR | Status: DC | PRN
  Administered 2012-12-20: 50 ug via INTRAVENOUS

## 2012-12-20 MED ORDER — FENTANYL CITRATE 0.05 MG/ML IJ SOLN
INTRAMUSCULAR | Status: AC
Start: 2012-12-20 — End: 2012-12-20
  Filled 2012-12-20: qty 2

## 2012-12-20 MED ORDER — ROCURONIUM BROMIDE 100 MG/10ML IV SOLN
INTRAVENOUS | Status: DC | PRN
  Administered 2012-12-20: 5 mg via INTRAVENOUS

## 2012-12-20 MED ORDER — LACTATED RINGERS IV SOLN
INTRAVENOUS | Status: DC
Start: 2012-12-20 — End: 2012-12-20
  Administered 2012-12-20: 07:00:00 via INTRAVENOUS

## 2012-12-20 MED ORDER — FERRIC SUBSULFATE SOLN
Status: DC | PRN
  Administered 2012-12-20: 1

## 2012-12-20 MED ORDER — PROPOFOL 10 MG/ML IV EMUL
INTRAVENOUS | Status: AC
  Filled 2012-12-20: qty 20

## 2012-12-20 MED ORDER — HYDROCODONE-ACETAMINOPHEN 5-325 MG PO TABS: 1.0000 | ORAL_TABLET | Freq: Four times a day (QID) | ORAL | Status: DC | PRN

## 2012-12-20 MED ORDER — WATER FOR IRRIGATION, STERILE IR SOLN
Status: DC | PRN
  Administered 2012-12-20: 1000 mL

## 2012-12-20 MED ORDER — FENTANYL CITRATE 0.05 MG/ML IJ SOLN
INTRAMUSCULAR | Status: AC
  Filled 2012-12-20: qty 5

## 2012-12-20 MED ORDER — CIPROFLOXACIN IN D5W 400 MG/200ML IV SOLN
400.0000 mg | INTRAVENOUS | Status: AC
  Administered 2012-12-20: 400 mg via INTRAVENOUS

## 2012-12-20 MED ORDER — DEXAMETHASONE SODIUM PHOSPHATE 4 MG/ML IJ SOLN
4.0000 mg | Freq: Once | INTRAMUSCULAR | Status: AC
  Administered 2012-12-20: 4 mg via INTRAVENOUS

## 2012-12-20 MED ORDER — PROPOFOL 10 MG/ML IV BOLUS
INTRAVENOUS | Status: DC | PRN
  Administered 2012-12-20: 140 mg via INTRAVENOUS

## 2012-12-20 MED ORDER — FENTANYL CITRATE 0.05 MG/ML IJ SOLN
INTRAMUSCULAR | Status: DC | PRN
  Administered 2012-12-20 (×2): 50 ug via INTRAVENOUS

## 2012-12-20 SURGICAL SUPPLY — 32 items
APPLICATOR COTTON TIP 6IN STRL (MISCELLANEOUS) ×2 IMPLANT
BAG HAMPER (MISCELLANEOUS) ×2 IMPLANT
CLOTH BEACON ORANGE TIMEOUT ST (SAFETY) ×2 IMPLANT
COAGULATOR SUCT SWTCH 10FR 6 (ELECTROSURGICAL) ×2 IMPLANT
COVER LIGHT HANDLE STERIS (MISCELLANEOUS) ×4 IMPLANT
ELECT REM PT RETURN 9FT ADLT (ELECTROSURGICAL) ×2
ELECTRODE REM PT RTRN 9FT ADLT (ELECTROSURGICAL) ×1 IMPLANT
FORMALIN 10 PREFIL 120ML (MISCELLANEOUS) ×2 IMPLANT
GAUZE SPONGE 4X4 16PLY XRAY LF (GAUZE/BANDAGES/DRESSINGS) ×2 IMPLANT
GLOVE BIOGEL PI IND STRL 7.0 (GLOVE) ×2 IMPLANT
GLOVE BIOGEL PI IND STRL 8 (GLOVE) ×1 IMPLANT
GLOVE BIOGEL PI INDICATOR 7.0 (GLOVE) ×2
GLOVE BIOGEL PI INDICATOR 8 (GLOVE) ×1
GLOVE ECLIPSE 8.0 STRL XLNG CF (GLOVE) ×2 IMPLANT
GLOVE SS BIOGEL STRL SZ 6.5 (GLOVE) ×1 IMPLANT
GLOVE SUPERSENSE BIOGEL SZ 6.5 (GLOVE) ×1
GOWN STRL REIN XL XLG (GOWN DISPOSABLE) ×6 IMPLANT
KIT ROOM TURNOVER APOR (KITS) ×2 IMPLANT
LASER FIBER DISP 1000U (UROLOGICAL SUPPLIES) ×2 IMPLANT
MANIFOLD NEPTUNE II (INSTRUMENTS) ×2 IMPLANT
NS IRRIG 1000ML POUR BTL (IV SOLUTION) ×2 IMPLANT
PACK BASIC III (CUSTOM PROCEDURE TRAY) ×1
PACK PERI GYN (CUSTOM PROCEDURE TRAY) ×2 IMPLANT
PACK SRG BSC III STRL LF ECLPS (CUSTOM PROCEDURE TRAY) ×1 IMPLANT
PAD ARMBOARD 7.5X6 YLW CONV (MISCELLANEOUS) ×2 IMPLANT
PREFILTER SMOKE EVAC (FILTER) ×2 IMPLANT
SET BASIN LINEN APH (SET/KITS/TRAYS/PACK) ×2 IMPLANT
SUT VIC AB 0 CT1 27 (SUTURE)
SUT VIC AB 0 CT1 27XCR 8 STRN (SUTURE) IMPLANT
TOWEL OR 17X26 4PK STRL BLUE (TOWEL DISPOSABLE) ×2 IMPLANT
TUBING SMOKE EVAC CO2 (TUBING) ×2 IMPLANT
WATER STERILE IRR 1000ML POUR (IV SOLUTION) ×2 IMPLANT

## 2012-12-20 NOTE — Op Note (Signed)
Preoperative diagnosis:  1.  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  Inadequate colposcopy                                         3.  Rheumatoid Arthritis with chronic immune suppression  Postoperative Diagnosis:  Same as above  Procedure:  Cervical conization using laser,  ablation of cervical bed using laser  Surgeon:  Rockne Coons MD  Anesthesia:  Laryngeal mask airway  Findings:  Patient has had several abnormal paps over the last few years but no biopsy dysplasia.  Her HPV id was 52 and the cervical  Lesion extended into the endocervical canal beyond visualization.  Today at the time of surgery a repeat colposcopy was performed using 3% acetic acid and the lesion was once again confirmed.  There  were no new findings today.  Description of operation:  Patient was taken to the operating room and placed in the supine position where she underwent laryngeal mask airway anesthesia.  She was then placed in the high lithotomy position using candy cane stirrups.  She was then draped out for a laser procedure.  The microscope was used and 3% acetic acid was placed on the cervix.  The holmium laser was then employed at a power of 2.5 and rates between 8 and 15.  I achieved a couple millimeter margin around lesions both at 12:00 and 6:00 the laser was used to perform a conization.  The specimen was removed and sent to pathology for evaluation.  As is always the case with laser I did achieve at an appropriate margin around the disease with shrinkage of the tissue during the procedure it may appear to be a positive lateral margin.  However the surgical margin is indeed clear.  I then used the laser to ablate the conization bed to a depth of 5-7 mm laterally coning down to 9 mm centrally and began getting good surgical margin.  Additional hemostasis was achieved using Monsel solution.  In the conization bed was  completely hemostatic.  Blood loss for the procedure was none.  The patient received 400 mg of ciprofloxacin and 600 mg of Cleocin preoperatively.  The patient was awakened from anesthesia and taken to the recovery room in good stable condition with all counts being correct.  She will be followed up in the office in one month for evaluation of the conization bed.  Alexanderia Gorby H 12/20/2012 8:24 AM

## 2012-12-20 NOTE — Anesthesia Procedure Notes (Signed)
Procedure Name: Intubation Date/Time: 12/20/2012 7:48 AM Performed by: Glynn Octave E Pre-anesthesia Checklist: Patient identified, Patient being monitored, Timeout performed, Emergency Drugs available and Suction available Patient Re-evaluated:Patient Re-evaluated prior to inductionOxygen Delivery Method: Circle System Utilized Preoxygenation: Pre-oxygenation with 100% oxygen Intubation Type: IV induction, Rapid sequence and Cricoid Pressure applied Ventilation: Mask ventilation without difficulty Laryngoscope Size: Mac and 3 Grade View: Grade I Tube type: Oral Tube size: 7.0 mm Number of attempts: 1 Airway Equipment and Method: stylet Placement Confirmation: ETT inserted through vocal cords under direct vision,  positive ETCO2 and breath sounds checked- equal and bilateral Secured at: 21 cm Tube secured with: Tape Dental Injury: Teeth and Oropharynx as per pre-operative assessment

## 2012-12-20 NOTE — H&P (Signed)
Samantha Rosales is an 32 y.o. female. With LSIL on colposcopic exam and cervical biopsy but the lesion extends into the endocervical canal, pap revealed HSIL with positive HPV 52.  As a result she needs laser conization for diagnostic and therapeutic purposes.     Past Medical History  Diagnosis Date  . GERD (gastroesophageal reflux disease)   . Arthritis     rheumatoid arthritis  . Seizures     dx as child with epilepsy but no seizures since 89 and on no meds    History reviewed. No pertinent past surgical history.  History reviewed. No pertinent family history.  Social History:  reports that she has been smoking Cigarettes.  She has a 6 pack-year smoking history. She does not have any smokeless tobacco history on file. She reports that she drinks alcohol. She reports that she does not use illicit drugs.  Allergies:  Allergies  Allergen Reactions  . Penicillins     Prescriptions prior to admission  Medication Sig Dispense Refill  . Biotin 1000 MCG tablet Take 1,000 mcg by mouth daily.      Marland Kitchen etanercept (ENBREL) 50 MG/ML injection Inject 50 mg into the skin once a week.      . folic acid (FOLVITE) 1 MG tablet Take 1 mg by mouth daily.      . hydroxychloroquine (PLAQUENIL) 200 MG tablet Take 200 mg by mouth 2 (two) times daily.      . medroxyPROGESTERone (DEPO-PROVERA) 150 MG/ML injection Inject 150 mg into the muscle every 3 (three) months.      . methotrexate (RHEUMATREX) 2.5 MG tablet Take 2.5 mg by mouth once a week. Caution:Chemotherapy. Protect from light. On Monday      . Omega-3 Fatty Acids (FISH OIL) 1000 MG CAPS Take 1,000 mg by mouth daily.      Marland Kitchen omeprazole (PRILOSEC) 20 MG capsule Take 20 mg by mouth daily.      . Probiotic Product (PROBIOTIC DAILY PO) Take 1 tablet by mouth daily.        ROS  Review of Systems  Constitutional: Negative for fever, chills, weight loss, malaise/fatigue and diaphoresis.  HENT: Negative for hearing loss, ear pain, nosebleeds,  congestion, sore throat, neck pain, tinnitus and ear discharge.   Eyes: Negative for blurred vision, double vision, photophobia, pain, discharge and redness.  Respiratory: Negative for cough, hemoptysis, sputum production, shortness of breath, wheezing and stridor.   Cardiovascular: Negative for chest pain, palpitations, orthopnea, claudication, leg swelling and PND.  Gastrointestinal:Negativee for abdominal pain. Negative for heartburn, nausea, vomiting, diarrhea, constipation, blood in stool and melena.  Genitourinary: Negative for dysuria, urgency, frequency, hematuria and flank pain.  Musculoskeletal: Negative for myalgias, back pain, joint pain and falls.  Skin: Negative for itching and rash.  Neurological: Negative for dizziness, tingling, tremors, sensory change, speech change, focal weakness, seizures, loss of consciousness, weakness and headaches.  Endo/Heme/Allergies: Negative for environmental allergies and polydipsia. Does not bruise/bleed easily.  Psychiatric/Behavioral: Negative for depression, suicidal ideas, hallucinations, memory loss and substance abuse. The patient is not nervous/anxious and does not have insomnia.     Blood pressure 155/112, pulse 102, temperature 98.2 F (36.8 C), temperature source Oral, resp. rate 19, SpO2 100.00%. Physical Exam Physical Exam  Vitals reviewed. Constitutional: She is oriented to person, place, and time. She appears well-developed and well-nourished.  HENT:  Head: Normocephalic and atraumatic.  Right Ear: External ear normal.  Left Ear: External ear normal.  Nose: Nose normal.  Mouth/Throat: Oropharynx is  clear and moist.  Eyes: Conjunctivae and EOM are normal. Pupils are equal, round, and reactive to light. Right eye exhibits no discharge. Left eye exhibits no discharge. No scleral icterus.  Neck: Normal range of motion. Neck supple. No tracheal deviation present. No thyromegaly present.  Cardiovascular: Normal rate, regular rhythm,  normal heart sounds and intact distal pulses.  Exam reveals no gallop and no friction rub.   No murmur heard. Respiratory: Effort normal and breath sounds normal. No respiratory distress. She has no wheezes. She has no rales. She exhibits no tenderness.  GI: Soft. Bowel sounds are normal. She exhibits no distension and no mass. There is tenderness. There is no rebound and no guarding.  Genitourinary:       Vulva is normal without lesions Vagina is pink moist without discharge Cervix normal in appearance  Uterus is normal Adnexa is negative with normal sized ovaries by sonogram  Musculoskeletal: Normal range of motion. She exhibits no edema and no tenderness.  Neurological: She is alert and oriented to person, place, and time. She has normal reflexes. She displays normal reflexes. No cranial nerve deficit. She exhibits normal muscle tone. Coordination normal.  Skin: Skin is warm and dry. No rash noted. No erythema. No pallor.  Psychiatric: She has a normal mood and affect. Her behavior is normal. Judgment and thought content normal.    Recent Results (from the past 336 hour(s))  CBC   Collection Time   12/13/12  3:12 PM      Component Value Range   WBC 3.3 (*) 4.0 - 10.5 K/uL   RBC 4.12  3.87 - 5.11 MIL/uL   Hemoglobin 12.8  12.0 - 15.0 g/dL   HCT 16.1  09.6 - 04.5 %   MCV 92.2  78.0 - 100.0 fL   MCH 31.1  26.0 - 34.0 pg   MCHC 33.7  30.0 - 36.0 g/dL   RDW 40.9  81.1 - 91.4 %   Platelets 236  150 - 400 K/uL  COMPREHENSIVE METABOLIC PANEL   Collection Time   12/13/12  3:12 PM      Component Value Range   Sodium 137  135 - 145 mEq/L   Potassium 4.1  3.5 - 5.1 mEq/L   Chloride 101  96 - 112 mEq/L   CO2 26  19 - 32 mEq/L   Glucose, Bld 81  70 - 99 mg/dL   BUN 11  6 - 23 mg/dL   Creatinine, Ser 7.82  0.50 - 1.10 mg/dL   Calcium 95.6  8.4 - 21.3 mg/dL   Total Protein 7.8  6.0 - 8.3 g/dL   Albumin 4.3  3.5 - 5.2 g/dL   AST 30  0 - 37 U/L   ALT 32  0 - 35 U/L   Alkaline Phosphatase  88  39 - 117 U/L   Total Bilirubin 0.4  0.3 - 1.2 mg/dL   GFR calc non Af Amer >90  >90 mL/min   GFR calc Af Amer >90  >90 mL/min  HCG, QUANTITATIVE, PREGNANCY   Collection Time   12/13/12  3:12 PM      Component Value Range   hCG, Beta Chain, Quant, S <1  <5 mIU/mL  URINALYSIS, ROUTINE W REFLEX MICROSCOPIC   Collection Time   12/13/12  3:17 PM      Component Value Range   Color, Urine YELLOW  YELLOW   APPearance HAZY (*) CLEAR   Specific Gravity, Urine >1.030 (*) 1.005 - 1.030  pH 5.5  5.0 - 8.0   Glucose, UA NEGATIVE  NEGATIVE mg/dL   Hgb urine dipstick NEGATIVE  NEGATIVE   Bilirubin Urine NEGATIVE  NEGATIVE   Ketones, ur NEGATIVE  NEGATIVE mg/dL   Protein, ur NEGATIVE  NEGATIVE mg/dL   Urobilinogen, UA 0.2  0.0 - 1.0 mg/dL   Nitrite NEGATIVE  NEGATIVE   Leukocytes, UA SMALL (*) NEGATIVE  URINE MICROSCOPIC-ADD ON   Collection Time   12/13/12  3:17 PM      Component Value Range   Squamous Epithelial / LPF MANY (*) RARE   WBC, UA 0-2  <3 WBC/hpf   Bacteria, UA MANY (*) RARE  SURGICAL PCR SCREEN   Collection Time   12/13/12  3:18 PM      Component Value Range   MRSA, PCR NEGATIVE  NEGATIVE   Staphylococcus aureus NEGATIVE  NEGATIVE       Assessment/Plan: 1.  Inadequate colposcopy 2.  HSIL 3.  Chronic immune suppression due to RA 4.  HPV 52  Proceed with laser conization for therapeutic and diagnostic indications.  Zyen Triggs H 12/20/2012, 7:17 AM

## 2012-12-20 NOTE — Anesthesia Preprocedure Evaluation (Signed)
Anesthesia Evaluation  Patient identified by MRN, date of birth, ID band Patient awake    Reviewed: Allergy & Precautions, H&P , NPO status , Patient's Chart, lab work & pertinent test results  History of Anesthesia Complications Negative for: history of anesthetic complications  Airway Mallampati: I      Dental  (+) Teeth Intact   Pulmonary Current Smoker, PE: am cough. breath sounds clear to auscultation        Cardiovascular negative cardio ROS  Rhythm:Regular Rate:Normal     Neuro/Psych Seizures - (none since 1999),     GI/Hepatic GERD-  Medicated,  Endo/Other    Renal/GU      Musculoskeletal  (+) Arthritis - (off steroids), Rheumatoid disorders,    Abdominal   Peds  Hematology   Anesthesia Other Findings   Reproductive/Obstetrics                           Anesthesia Physical Anesthesia Plan  ASA: III  Anesthesia Plan: General   Post-op Pain Management:    Induction: Intravenous, Rapid sequence and Cricoid pressure planned  Airway Management Planned: Oral ETT  Additional Equipment:   Intra-op Plan:   Post-operative Plan: Extubation in OR  Informed Consent: I have reviewed the patients History and Physical, chart, labs and discussed the procedure including the risks, benefits and alternatives for the proposed anesthesia with the patient or authorized representative who has indicated his/her understanding and acceptance.     Plan Discussed with:   Anesthesia Plan Comments:         Anesthesia Quick Evaluation

## 2012-12-20 NOTE — Anesthesia Postprocedure Evaluation (Signed)
  Anesthesia Post-op Note  Patient: Samantha Rosales  Procedure(s) Performed: Procedure(s) (LRB) with comments: CONIZATION CERVIX WITH BIOPSY (N/A) HOLMIUM LASER APPLICATION (N/A)  Patient Location: PACU  Anesthesia Type:General  Level of Consciousness: awake, alert  and oriented  Airway and Oxygen Therapy: Patient Spontanous Breathing and Patient connected to face mask oxygen  Post-op Pain: mild  Post-op Assessment: Post-op Vital signs reviewed, Patient's Cardiovascular Status Stable, Respiratory Function Stable, Patent Airway and No signs of Nausea or vomiting  Post-op Vital Signs: Reviewed and stable  Complications: No apparent anesthesia complications

## 2012-12-20 NOTE — Transfer of Care (Signed)
Immediate Anesthesia Transfer of Care Note  Patient: Samantha Rosales  Procedure(s) Performed: Procedure(s) (LRB) with comments: CONIZATION CERVIX WITH BIOPSY (N/A) HOLMIUM LASER APPLICATION (N/A)  Patient Location: PACU  Anesthesia Type:General  Level of Consciousness: awake  Airway & Oxygen Therapy: Patient Spontanous Breathing and Patient connected to face mask oxygen  Post-op Assessment: Report given to PACU RN  Post vital signs: Reviewed  Complications: No apparent anesthesia complications

## 2012-12-24 ENCOUNTER — Encounter (HOSPITAL_COMMUNITY): Payer: Self-pay | Admitting: Obstetrics & Gynecology

## 2013-02-25 ENCOUNTER — Ambulatory Visit (INDEPENDENT_AMBULATORY_CARE_PROVIDER_SITE_OTHER): Payer: BC Managed Care – PPO | Admitting: Obstetrics & Gynecology

## 2013-02-25 ENCOUNTER — Encounter: Payer: Self-pay | Admitting: Obstetrics & Gynecology

## 2013-02-25 VITALS — BP 120/72 | Ht 68.0 in | Wt 180.0 lb

## 2013-02-25 DIAGNOSIS — Z3202 Encounter for pregnancy test, result negative: Secondary | ICD-10-CM

## 2013-02-25 DIAGNOSIS — Z3049 Encounter for surveillance of other contraceptives: Secondary | ICD-10-CM

## 2013-02-25 DIAGNOSIS — Z309 Encounter for contraceptive management, unspecified: Secondary | ICD-10-CM

## 2013-02-25 LAB — POCT URINE PREGNANCY: Preg Test, Ur: NEGATIVE

## 2013-02-25 MED ORDER — MEDROXYPROGESTERONE ACETATE 150 MG/ML IM SUSP
150.0000 mg | Freq: Once | INTRAMUSCULAR | Status: AC
  Administered 2013-02-25: 150 mg via INTRAMUSCULAR

## 2013-03-10 ENCOUNTER — Ambulatory Visit (INDEPENDENT_AMBULATORY_CARE_PROVIDER_SITE_OTHER): Payer: BC Managed Care – PPO | Admitting: Physician Assistant

## 2013-03-10 ENCOUNTER — Encounter: Payer: Self-pay | Admitting: Physician Assistant

## 2013-03-10 VITALS — BP 132/86 | HR 88 | Temp 98.4°F | Resp 18 | Ht 67.5 in | Wt 180.0 lb

## 2013-03-10 DIAGNOSIS — L500 Allergic urticaria: Secondary | ICD-10-CM

## 2013-03-10 MED ORDER — PREDNISONE 20 MG PO TABS: ORAL_TABLET | ORAL | Status: DC

## 2013-03-11 NOTE — Progress Notes (Signed)
Patient ID: Samantha Rosales MRN: [id number], DOB: 10/06/1981, 32 y.o. Date of Encounter: 03/11/2013, 8:27 AM    Chief Complaint:  Chief Complaint  Patient presents with  . allergic reaction?? hives on face neck upper chest     HPI: 32 y.o. year old female here b/c she developed urticarial rash on face, neck, upper chest. She works at an ophthalmology office. Developed this about one hour ago, which was about two hours after eating lunch. Ate no seafood, no new unusual foods. Has had no swelling of the throat, no difficulty breathing, or wheezing. Only this rash. She ways the rash is actually better now than it was one hour ago, at onset. Has taken no benadryl, has used no treatment. No known exposure to any allergen-no lotion, etc.  Home Meds: Current Outpatient Prescriptions on File Prior to Visit  Medication Sig Dispense Refill  . Biotin 1000 MCG tablet Take 1,000 mcg by mouth daily.      Marland Kitchen etanercept (ENBREL) 50 MG/ML injection Inject 50 mg into the skin once a week.      . folic acid (FOLVITE) 1 MG tablet Take 1 mg by mouth daily.      . hydroxychloroquine (PLAQUENIL) 200 MG tablet Take 200 mg by mouth 2 (two) times daily.      . medroxyPROGESTERone (DEPO-PROVERA) 150 MG/ML injection Inject 150 mg into the muscle every 3 (three) months.      . methotrexate (RHEUMATREX) 2.5 MG tablet Take 2.5 mg by mouth once a week. Caution:Chemotherapy. Protect from light. On Monday      . Omega-3 Fatty Acids (FISH OIL) 1000 MG CAPS Take 1,000 mg by mouth daily.      Marland Kitchen omeprazole (PRILOSEC) 20 MG capsule Take 20 mg by mouth daily.      . Probiotic Product (PROBIOTIC DAILY PO) Take 1 tablet by mouth daily.      Marland Kitchen HYDROcodone-acetaminophen (NORCO/VICODIN) 5-325 MG per tablet Take 1 tablet by mouth every 6 (six) hours as needed for pain.  30 tablet  0  . ondansetron (ZOFRAN) 8 MG tablet Take 1 tablet (8 mg total) by mouth every 8 (eight) hours as needed for nausea.  10 tablet  0   No current  facility-administered medications on file prior to visit.    Allergies:  Allergies  Allergen Reactions  . Penicillins       Review of Systems: See HPI for pertininent ros. All others negative.  Physical Exam: Blood pressure 132/86, pulse 88, temperature 98.4 F (36.9 C), temperature source Oral, resp. rate 18, height 5' 7.5" (1.715 m), weight 180 lb (81.647 kg)., Body mass index is 27.76 kg/(m^2). General: Well developed, well nourished, AAF in no acute distress. Neck: Supple. No swelling, enlargement -palpated. No thyromegaly. Full ROM. No lymphadenopathy. Lungs: Clear bilaterally to auscultation without wheezes, rales, or rhonchi. Breathing is unlabored. Heart: RRR with S1 S2. No murmurs, rubs, or gallops. Msk:  Strength and tone normal for age. Extremities/Skin: Urticaria on both cheeks, across bridge of nose. Splotches of urticaria on neck, Fewer splotches of urticaria on upper chest. Remainder of skin is normal. Neuro: Alert and oriented X 3. Moves all extremities spontaneously. Gait is normal. CNII-XII grossly in tact. Psych:  Responds to questions appropriately with a normal affect.    ASSESSMENT AND PLAN:  32 y.o. year old female with  1. Allergic urticaria - predniSONE (DELTASONE) 20 MG tablet; Take 3 daily for 2 days, then 2 daily for 2 days, then 1 daily for  2 days.  Dispense: 12 tablet; Refill: 0 Also use Benadryl otc 50mg  Q 8 hr. F/u if urticaria worsens or if develops swelling of throat or difficulty breathing.  Murray Hodgkins [place], [place], [facility] 03/11/2013 8:27 AM

## 2013-03-13 ENCOUNTER — Telehealth: Payer: Self-pay | Admitting: *Deleted

## 2013-03-13 DIAGNOSIS — T7840XS Allergy, unspecified, sequela: Secondary | ICD-10-CM

## 2013-03-13 NOTE — Telephone Encounter (Signed)
Pt wants to know if you will send her to an allergist for her allergic reactions.  Thanks,  Rob

## 2013-03-13 NOTE — Telephone Encounter (Signed)
done

## 2013-03-18 ENCOUNTER — Other Ambulatory Visit: Payer: Self-pay | Admitting: Family Medicine

## 2013-03-19 ENCOUNTER — Telehealth: Payer: Self-pay | Admitting: Family Medicine

## 2013-03-19 MED ORDER — OMEPRAZOLE 20 MG PO CPDR: 20.0000 mg | DELAYED_RELEASE_CAPSULE | Freq: Every day | ORAL | Status: DC

## 2013-03-19 NOTE — Telephone Encounter (Signed)
Rx Refilled  

## 2013-05-29 ENCOUNTER — Encounter: Payer: Self-pay | Admitting: *Deleted

## 2013-05-29 ENCOUNTER — Other Ambulatory Visit: Payer: Self-pay | Admitting: Adult Health

## 2013-05-29 DIAGNOSIS — B977 Papillomavirus as the cause of diseases classified elsewhere: Secondary | ICD-10-CM

## 2013-05-29 DIAGNOSIS — B009 Herpesviral infection, unspecified: Secondary | ICD-10-CM

## 2013-05-30 ENCOUNTER — Ambulatory Visit (INDEPENDENT_AMBULATORY_CARE_PROVIDER_SITE_OTHER): Payer: BC Managed Care – PPO | Admitting: Obstetrics & Gynecology

## 2013-05-30 ENCOUNTER — Other Ambulatory Visit: Payer: Self-pay | Admitting: Family Medicine

## 2013-05-30 VITALS — BP 120/80 | Ht 68.0 in | Wt 176.0 lb

## 2013-05-30 DIAGNOSIS — Z3202 Encounter for pregnancy test, result negative: Secondary | ICD-10-CM

## 2013-05-30 DIAGNOSIS — Z3049 Encounter for surveillance of other contraceptives: Secondary | ICD-10-CM

## 2013-05-30 DIAGNOSIS — Z309 Encounter for contraceptive management, unspecified: Secondary | ICD-10-CM

## 2013-05-30 DIAGNOSIS — Z3200 Encounter for pregnancy test, result unknown: Secondary | ICD-10-CM

## 2013-05-30 LAB — POCT URINE PREGNANCY: Preg Test, Ur: NEGATIVE

## 2013-05-30 MED ORDER — MEDROXYPROGESTERONE ACETATE 150 MG/ML IM SUSP
150.0000 mg | Freq: Once | INTRAMUSCULAR | Status: AC
  Administered 2013-05-30: 150 mg via INTRAMUSCULAR

## 2013-06-18 ENCOUNTER — Telehealth: Payer: Self-pay | Admitting: Family Medicine

## 2013-06-18 MED ORDER — PREDNISONE 20 MG PO TABS: ORAL_TABLET | ORAL | Status: DC

## 2013-06-18 NOTE — Telephone Encounter (Signed)
Pt called.  Denies any shortness of breath, throat swelling or tightness.  RX for prednisone to pharmacy, told OTC Benadryl

## 2013-06-18 NOTE — Telephone Encounter (Signed)
i reviewe OV note 03/11/13. She had urticaria. Had no throat swelling, no difficulty breathing. Verify that this is the case this time and document. If she is having any throat swelling/tightness, or difficulty breathing, NTBS.  If just urticaria, take oral Benadryl routinely. And order prednisone 20mg        3 daily x 2 days; 2 QDx2days. 1 QD x 2 days.

## 2013-07-11 ENCOUNTER — Institutional Professional Consult (permissible substitution): Payer: BC Managed Care – PPO | Admitting: Internal Medicine

## 2013-08-15 ENCOUNTER — Ambulatory Visit (INDEPENDENT_AMBULATORY_CARE_PROVIDER_SITE_OTHER): Payer: BC Managed Care – PPO | Admitting: Internal Medicine

## 2013-08-15 ENCOUNTER — Encounter: Payer: Self-pay | Admitting: Internal Medicine

## 2013-08-15 ENCOUNTER — Other Ambulatory Visit: Payer: BC Managed Care – PPO

## 2013-08-15 VITALS — BP 124/80 | HR 92 | Ht 68.0 in | Wt 175.0 lb

## 2013-08-15 DIAGNOSIS — L500 Allergic urticaria: Secondary | ICD-10-CM

## 2013-08-15 DIAGNOSIS — L501 Idiopathic urticaria: Secondary | ICD-10-CM

## 2013-08-15 NOTE — Progress Notes (Signed)
08/15/13- 72 yoF smoker Referral by Dr Lynnea Ferrier; several times had allerigc reactions-no ER visit or Epi pen use. Just feels the hives start up. One half pack per day smoker, living with her children and working as a Scientist, physiological at an ophthalmology office. Onset of episodic urticaria 2 years ago, happening a couple of times a year, seemingly at random. One episode in April of 2014 and another about a week ago. Has been treated with Benadryl and prednisone. Denies angioedema or wheezing. She thinks some foods might cause a rash on her face but she is not at all sure of association. Mild history of seasonal allergic rhinitis/hay fever. Being treated for rheumatoid arthritis on methotrexate. Denies family history of urticaria.  Prior to Admission medications   Medication Sig Start Date End Date Taking? Authorizing Provider  Biotin 1000 MCG tablet Take 1,000 mcg by mouth daily.   Yes Historical Provider, MD  Cholecalciferol (VITAMIN D-3) 1000 UNITS CAPS Take 1 capsule by mouth daily.   Yes Historical Provider, MD  etanercept (ENBREL) 50 MG/ML injection Inject 50 mg into the skin once a week.   Yes Historical Provider, MD  folic acid (FOLVITE) 1 MG tablet Take 1 mg by mouth daily.   Yes Historical Provider, MD  hydrochlorothiazide (MICROZIDE) 12.5 MG capsule TAKE ONE CAPSULE BY MOUTH ONCE DAILY 05/30/13  Yes Donita Brooks, MD  hydroxychloroquine (PLAQUENIL) 200 MG tablet Take 200 mg by mouth 2 (two) times daily.   Yes Historical Provider, MD  medroxyPROGESTERone (DEPO-PROVERA) 150 MG/ML injection INJECT 1 ML INTRAMUSCULARLY IN DOCTOR'S OFFICE EVERY 3 MONTHS. 05/29/13  Yes Adline Potter, NP  methotrexate (RHEUMATREX) 2.5 MG tablet Take 7 tablets by mouth weekly  Caution:Chemotherapy. Protect from light. On Monday   Yes Historical Provider, MD  Omega-3 Fatty Acids (FISH OIL) 1000 MG CAPS Take 1,000 mg by mouth daily.   Yes Historical Provider, MD  omeprazole (PRILOSEC) 20 MG capsule Take 1  capsule (20 mg total) by mouth daily. 03/19/13  Yes Donita Brooks, MD  Probiotic Product (PROBIOTIC DAILY PO) Take 1 tablet by mouth daily.   Yes Historical Provider, MD   Past Medical History  Diagnosis Date  . GERD (gastroesophageal reflux disease)   . Seizures     dx as child with epilepsy but no seizures since 89 and on no meds  . Hypertension   . BV (bacterial vaginosis)   . HSV-2 (herpes simplex virus 2) infection   . Abnormal Pap smear   . Rheumatoid arthritis   . Allergic rhinitis    .pshj History reviewed. No pertinent family history. History   Social History  . Marital Status: Single    Spouse Name: N/A    Number of Children: 2  . Years of Education: N/A   Occupational History  . [employer]    Social History Main Topics  . Smoking status: Current Every Day Smoker -- 0.50 packs/day for 12 years    Types: Cigarettes  . Smokeless tobacco: Never Used  . Alcohol Use: Yes     Comment: social  . Drug Use: No  . Sexual Activity: Yes    Birth Control/ Protection: Injection     Comment: Depo Provera every 3 months   Other Topics Concern  . Not on file   Social History Narrative  . No narrative on file   ROS-see HPI Constitutional:   No-   weight loss, night sweats, fevers, chills, fatigue, lassitude. HEENT:   +  headaches,  No-difficulty swallowing, tooth/dental problems, sore throat,       No-  sneezing, itching, ear ache, nasal congestion, post nasal drip,  CV:  No-   chest pain, orthopnea, PND, swelling in lower extremities, anasarca, dizziness, palpitations Resp: No-   shortness of breath with exertion or at rest.              No-   productive cough,  No non-productive cough,  No- coughing up of blood.              No-   change in color of mucus.  No- wheezing.   Skin: No-   rash or lesions. GI:  No-   +heartburn, +indigestion, abdominal pain, nausea, vomiting, diarrhea,                 change in bowel habits, loss of appetite GU: No-    dysuria, change in color of urine, no urgency or frequency.  No- flank pain. MS:  +  joint pain or swelling.  No- decreased range of motion.  No- back pain. Neuro-     nothing unusual Psych:  No- change in mood or affect. No depression or anxiety.  No memory loss.  OBJ- Physical Exam General- Alert, Oriented, Affect-appropriate, Distress- none acute Skin- + subtle facial rash, "butterfly" Lymphadenopathy- none Head- atraumatic            Eyes- Gross vision intact, PERRLA, conjunctivae and secretions clear            Ears- Hearing, canals-normal            Nose- Clear, no-Septal dev, mucus, polyps, erosion, perforation             Throat- Mallampati II , mucosa clear , drainage- none, tonsils- atrophic Neck- flexible , trachea midline, no stridor , thyroid nl, carotid no bruit Chest - symmetrical excursion , unlabored           Heart/CV- RRR , no murmur , no gallop  , no rub, nl s1 s2                           - JVD- none , edema- none, stasis changes- none, varices- none           Lung- clear to P&A, wheeze- none, cough- none , dullness-none, rub- none           Chest wall-  Abd- tender-no, distended-no, bowel sounds-present, HSM- no Br/ Gen/ Rectal- Not done, not indicated Extrem- cyanosis- none, clubbing, none, atrophy- none, strength- nl Neuro- grossly intact to observation

## 2013-08-15 NOTE — Patient Instructions (Addendum)
Order- lab Allergy profile  At first sign of hives as long as needed Benadryl 25 mg every 6 hours  Pepcid/ famotadine 20 mg (otc acid blocker, but also a type of antihistamine) twice daily  Please call as needed

## 2013-08-18 LAB — ALLERGY FULL PROFILE
Allergen, D pternoyssinus,d7: 1.16 kU/L — ABNORMAL HIGH
Allergen,Goose feathers, e70: <0.1 kU/L
Alternaria Alternata: 0.25 kU/L — ABNORMAL HIGH
Aspergillus fumigatus, m3: 0.96 kU/L — ABNORMAL HIGH
Bahia Grass: <0.1 kU/L
Bermuda Grass: 0.16 kU/L — ABNORMAL HIGH
Box Elder IgE: 2.12 kU/L — ABNORMAL HIGH
Candida Albicans: 6 kU/L — ABNORMAL HIGH
Cat Dander: <0.1 kU/L
Common Ragweed: <0.1 kU/L
Curvularia lunata: 0.28 kU/L — ABNORMAL HIGH
D. farinae: 6.61 kU/L — ABNORMAL HIGH
Dog Dander: <0.1 kU/L
Elm IgE: 0.22 kU/L — ABNORMAL HIGH
Fescue: <0.1 kU/L
G005 Rye, Perennial: <0.1 kU/L
G009 Red Top: <0.1 kU/L
Goldenrod: <0.1 kU/L
Helminthosporium halodes: 0.56 kU/L — ABNORMAL HIGH
House Dust Hollister: 0.16 kU/L — ABNORMAL HIGH
IgE (Immunoglobulin E), Serum: 1150.2 IU/mL — ABNORMAL HIGH (ref 0.0–180.0)
Lamb's Quarters: <0.1 kU/L
Oak: <0.1 kU/L
Plantain: <0.1 kU/L
Stemphylium Botryosum: 1.1 kU/L — ABNORMAL HIGH
Sycamore Tree: <0.1 kU/L
Timothy Grass: 0.12 kU/L — ABNORMAL HIGH

## 2013-08-22 ENCOUNTER — Encounter: Payer: Self-pay | Admitting: Adult Health

## 2013-08-22 ENCOUNTER — Ambulatory Visit (INDEPENDENT_AMBULATORY_CARE_PROVIDER_SITE_OTHER): Payer: BC Managed Care – PPO | Admitting: Adult Health

## 2013-08-22 VITALS — BP 122/80 | Ht 68.0 in | Wt 175.0 lb

## 2013-08-22 DIAGNOSIS — Z3202 Encounter for pregnancy test, result negative: Secondary | ICD-10-CM

## 2013-08-22 DIAGNOSIS — Z3049 Encounter for surveillance of other contraceptives: Secondary | ICD-10-CM

## 2013-08-22 DIAGNOSIS — Z309 Encounter for contraceptive management, unspecified: Secondary | ICD-10-CM

## 2013-08-22 LAB — POCT URINE PREGNANCY: Preg Test, Ur: NEGATIVE

## 2013-08-22 MED ORDER — MEDROXYPROGESTERONE ACETATE 150 MG/ML IM SUSP
150.0000 mg | Freq: Once | INTRAMUSCULAR | Status: AC
  Administered 2013-08-22: 150 mg via INTRAMUSCULAR

## 2013-08-22 NOTE — Addendum Note (Signed)
Addended by: Criss Alvine on: 08/22/2013 01:31 PM   Modules accepted: Orders, Level of Service

## 2013-08-24 DIAGNOSIS — L501 Idiopathic urticaria: Secondary | ICD-10-CM | POA: Insufficient documentation

## 2013-08-24 NOTE — Assessment & Plan Note (Signed)
Has had a handful of episodes over the past 2 years with no recognized pattern. Using Depo-Provera for birth control over that same time. Plan-see how she tolerates and responds acute episodic intervention that she can start herself, with Benadryl and Pepcid (H1 and H2) at first onset of urticaria. Consider food diary.

## 2013-08-26 NOTE — Progress Notes (Signed)
Quick Note:  LMTCB ______ 

## 2013-08-27 ENCOUNTER — Telehealth: Payer: Self-pay | Admitting: Internal Medicine

## 2013-08-27 NOTE — Progress Notes (Signed)
Quick Note:  Spoke with pt and notified of results per Dr. Young. Pt verbalized understanding and denied any questions.  ______ 

## 2013-08-27 NOTE — Telephone Encounter (Signed)
Notes Recorded by Waymon Budge, MD on 08/18/2013 at 4:47 PM Significant allergy antibody levels especially for dust mites, grass pollens and molds   lmtcb x1 for pt w/ co-worker

## 2013-08-27 NOTE — Telephone Encounter (Signed)
Pt returned call and can be reached @ [phone]. Samantha Rosales

## 2013-08-27 NOTE — Telephone Encounter (Signed)
Spoke with pt and notified of results per Dr. Young Pt verbalized understanding and denied any questions. 

## 2013-10-21 ENCOUNTER — Ambulatory Visit: Payer: BC Managed Care – PPO | Admitting: Family Medicine

## 2013-11-14 ENCOUNTER — Ambulatory Visit: Payer: BC Managed Care – PPO

## 2013-11-18 ENCOUNTER — Other Ambulatory Visit (HOSPITAL_COMMUNITY)
Admission: RE | Admit: 2013-11-18 | Discharge: 2013-11-18 | Disposition: A | Discharge status: Home / Self Care | Payer: BC Managed Care – PPO | Source: Ambulatory Visit | Attending: Obstetrics & Gynecology | Admitting: Obstetrics & Gynecology

## 2013-11-18 ENCOUNTER — Ambulatory Visit (INDEPENDENT_AMBULATORY_CARE_PROVIDER_SITE_OTHER): Payer: BC Managed Care – PPO | Admitting: Obstetrics & Gynecology

## 2013-11-18 ENCOUNTER — Encounter: Payer: Self-pay | Admitting: Obstetrics & Gynecology

## 2013-11-18 VITALS — BP 130/80 | Ht 68.4 in | Wt 173.0 lb

## 2013-11-18 DIAGNOSIS — Z01419 Encounter for gynecological examination (general) (routine) without abnormal findings: Secondary | ICD-10-CM

## 2013-11-18 DIAGNOSIS — Z1151 Encounter for screening for human papillomavirus (HPV): Secondary | ICD-10-CM | POA: Insufficient documentation

## 2013-11-18 DIAGNOSIS — I00 Rheumatic fever without heart involvement: Secondary | ICD-10-CM | POA: Insufficient documentation

## 2013-11-18 NOTE — Addendum Note (Signed)
Addended by: Criss Alvine on: 11/18/2013 05:34 PM   Modules accepted: Orders

## 2013-11-18 NOTE — Progress Notes (Signed)
Patient ID: Samantha Rosales, female   DOB: [birth year], 32 y.o.   MRN: [id number] Subjective:     Samantha Rosales is a 32 y.o. female here for a routine exam.  No LMP recorded. Patient has had an injection. [id number] Birth Control Method:  Depo provera Menstrual Calendar(currently): amenorrheic  Current complaints: none.   Current acute medical issues:  RA   Recent Gynecologic History No LMP recorded. Patient has had an injection. Last Pap: 2013,  normal Last mammogram: na,  na  Past Medical History  Diagnosis Date  . GERD (gastroesophageal reflux disease)   . Seizures     dx as child with epilepsy but no seizures since 89 and on no meds  . Hypertension   . BV (bacterial vaginosis)   . HSV-2 (herpes simplex virus 2) infection   . Abnormal Pap smear   . Rheumatoid arthritis   . Allergic rhinitis     Past Surgical History  Procedure Laterality Date  . Cervical conization w/bx  12/20/2012    Procedure: CONIZATION CERVIX WITH BIOPSY;  Surgeon: Lazaro Arms, MD;  Location: [facility];  Service: Gynecology;  Laterality: N/A;  . Holmium laser application  12/20/2012    Procedure: HOLMIUM LASER APPLICATION;  Surgeon: Lazaro Arms, MD;  Location: [facility];  Service: Gynecology;  Laterality: N/A;    OB History   Grav Para Term Preterm Abortions TAB SAB Ect Mult Living   4 2   2 1 1   2       History   Social History  . Marital Status: Single    Spouse Name: N/A    Number of Children: 2  . Years of Education: N/A   Occupational History  . [employer]    Social History Main Topics  . Smoking status: Current Every Day Smoker -- 0.50 packs/day for 12 years    Types: Cigarettes  . Smokeless tobacco: Never Used  . Alcohol Use: Yes     Comment: social  . Drug Use: No  . Sexual Activity: Yes    Birth Control/ Protection: Injection     Comment: Depo Provera every 3 months   Other Topics Concern  . None   Social History Narrative  . None    History reviewed.  No pertinent family history.   Review of Systems  Review of Systems  Constitutional: Negative for fever, chills, weight loss, malaise/fatigue and diaphoresis.  HENT: Negative for hearing loss, ear pain, nosebleeds, congestion, sore throat, neck pain, tinnitus and ear discharge.   Eyes: Negative for blurred vision, double vision, photophobia, pain, discharge and redness.  Respiratory: Negative for cough, hemoptysis, sputum production, shortness of breath, wheezing and stridor.   Cardiovascular: Negative for chest pain, palpitations, orthopnea, claudication, leg swelling and PND.  Gastrointestinal: negative for abdominal pain. Negative for heartburn, nausea, vomiting, diarrhea, constipation, blood in stool and melena.  Genitourinary: Negative for dysuria, urgency, frequency, hematuria and flank pain.  Musculoskeletal: Negative for myalgias, back pain, joint pain and falls.  Skin: Negative for itching and rash.  Neurological: Negative for dizziness, tingling, tremors, sensory change, speech change, focal weakness, seizures, loss of consciousness, weakness and headaches.  Endo/Heme/Allergies: Negative for environmental allergies and polydipsia. Does not bruise/bleed easily.  Psychiatric/Behavioral: Negative for depression, suicidal ideas, hallucinations, memory loss and substance abuse. The patient is not nervous/anxious and does not have insomnia.        Objective:    Physical Exam  Vitals reviewed. Constitutional: She is oriented to  person, place, and time. She appears well-developed and well-nourished.  HENT:  Head: Normocephalic and atraumatic.        Right Ear: External ear normal.  Left Ear: External ear normal.  Nose: Nose normal.  Mouth/Throat: Oropharynx is clear and moist.  Eyes: Conjunctivae and EOM are normal. Pupils are equal, round, and reactive to light. Right eye exhibits no discharge. Left eye exhibits no discharge. No scleral icterus.  Neck: Normal range of motion. Neck  supple. No tracheal deviation present. No thyromegaly present.  Cardiovascular: Normal rate, regular rhythm, normal heart sounds and intact distal pulses.  Exam reveals no gallop and no friction rub.   No murmur heard. Respiratory: Effort normal and breath sounds normal. No respiratory distress. She has no wheezes. She has no rales. She exhibits no tenderness.  GI: Soft. Bowel sounds are normal. She exhibits no distension and no mass. There is no tenderness. There is no rebound and no guarding.  Genitourinary:  Breasts no masses skin changes or nipple changes bilaterally      Vulva is normal without lesions Vagina is pink moist without discharge Cervix normal in appearance and pap is done Uterus is normal size shape and contour Adnexa is negative with normal sized ovaries    Musculoskeletal: Normal range of motion. She exhibits no edema and no tenderness.  Neurological: She is alert and oriented to person, place, and time. She has normal reflexes. She displays normal reflexes. No cranial nerve deficit. She exhibits normal muscle tone. Coordination normal.  Skin: Skin is warm and dry. No rash noted. No erythema. No pallor.  Psychiatric: She has a normal mood and affect. Her behavior is normal. Judgment and thought content normal.       Assessment:    Healthy female exam.    Plan:    Contraception: Depo-Provera injections. Follow up in: 1 year.  yearly

## 2013-11-19 ENCOUNTER — Ambulatory Visit (INDEPENDENT_AMBULATORY_CARE_PROVIDER_SITE_OTHER): Payer: BC Managed Care – PPO | Admitting: Adult Health

## 2013-11-19 ENCOUNTER — Encounter: Payer: Self-pay | Admitting: Adult Health

## 2013-11-19 VITALS — BP 126/78 | Ht 68.5 in | Wt 174.0 lb

## 2013-11-19 DIAGNOSIS — Z3049 Encounter for surveillance of other contraceptives: Secondary | ICD-10-CM

## 2013-11-19 DIAGNOSIS — Z309 Encounter for contraceptive management, unspecified: Secondary | ICD-10-CM

## 2013-11-19 DIAGNOSIS — Z3202 Encounter for pregnancy test, result negative: Secondary | ICD-10-CM

## 2013-11-19 LAB — HIV ANTIBODY (ROUTINE TESTING W REFLEX): HIV: NONREACTIVE

## 2013-11-19 LAB — POCT URINE PREGNANCY: Preg Test, Ur: NEGATIVE

## 2013-11-19 LAB — RPR

## 2013-11-19 MED ORDER — MEDROXYPROGESTERONE ACETATE 150 MG/ML IM SUSP
150.0000 mg | Freq: Once | INTRAMUSCULAR | Status: AC
  Administered 2013-11-19: 150 mg via INTRAMUSCULAR

## 2013-11-19 NOTE — Progress Notes (Signed)
Patient ID: Samantha Rosales, female   DOB: 07/11/1981, 32 y.o.   MRN: [id number] Depo Provera 150 mg with no complications, negative pregnancy test. Pt to return in 12 weeks for next injection.

## 2013-12-16 ENCOUNTER — Other Ambulatory Visit: Payer: Self-pay | Admitting: Family Medicine

## 2014-01-25 ENCOUNTER — Other Ambulatory Visit: Payer: Self-pay | Admitting: Family Medicine

## 2014-01-29 ENCOUNTER — Encounter: Payer: BC Managed Care – PPO | Admitting: Family Medicine

## 2014-02-13 ENCOUNTER — Ambulatory Visit (INDEPENDENT_AMBULATORY_CARE_PROVIDER_SITE_OTHER): Payer: BC Managed Care – PPO | Admitting: Family Medicine

## 2014-02-13 ENCOUNTER — Encounter: Payer: Self-pay | Admitting: Family Medicine

## 2014-02-13 ENCOUNTER — Ambulatory Visit (INDEPENDENT_AMBULATORY_CARE_PROVIDER_SITE_OTHER): Payer: BC Managed Care – PPO | Admitting: Adult Health

## 2014-02-13 ENCOUNTER — Ambulatory Visit: Payer: BC Managed Care – PPO

## 2014-02-13 VITALS — BP 130/78 | HR 86 | Temp 98.0°F | Resp 14 | Ht 68.0 in | Wt 169.0 lb

## 2014-02-13 VITALS — BP 122/80 | Ht 68.0 in | Wt 169.0 lb

## 2014-02-13 DIAGNOSIS — J019 Acute sinusitis, unspecified: Secondary | ICD-10-CM

## 2014-02-13 DIAGNOSIS — Z309 Encounter for contraceptive management, unspecified: Secondary | ICD-10-CM

## 2014-02-13 DIAGNOSIS — Z3200 Encounter for pregnancy test, result unknown: Secondary | ICD-10-CM

## 2014-02-13 DIAGNOSIS — Z0000 Encounter for general adult medical examination without abnormal findings: Secondary | ICD-10-CM

## 2014-02-13 DIAGNOSIS — Z3049 Encounter for surveillance of other contraceptives: Secondary | ICD-10-CM

## 2014-02-13 DIAGNOSIS — Z3202 Encounter for pregnancy test, result negative: Secondary | ICD-10-CM

## 2014-02-13 LAB — POCT URINE PREGNANCY: Preg Test, Ur: NEGATIVE

## 2014-02-13 MED ORDER — MEDROXYPROGESTERONE ACETATE 150 MG/ML IM SUSP
150.0000 mg | Freq: Once | INTRAMUSCULAR | Status: AC
  Administered 2014-02-13: 150 mg via INTRAMUSCULAR

## 2014-02-13 MED ORDER — AZITHROMYCIN 250 MG PO TABS: ORAL_TABLET | ORAL | Status: DC

## 2014-02-13 NOTE — Progress Notes (Signed)
Subjective:    Patient ID: Samantha Rosales, female    DOB: 1981-05-22, 33 y.o.   MRN: [id number]  HPI Patient is a very pleasant 33 year old African American female who presents today for complete physical exam. Past medical history is complicated by the fact that she has rheumatoid arthritis. She sees a rheumatologist for this. She also sees a gynecologist for her Pap smears and breast exams. A concern today is a sinus infection. For the last 2 weeks she's had pressure in her maxillary sinuses. She's had rhinorrhea and postnasal drip. She's had disequilibrium and headaches. She denies any fevers or chills. She also has a rosacea like rash on her cheeks and nose and forehead. She's been tested for lupus in the past and found not to have lupus. Past Medical History  Diagnosis Date  . GERD (gastroesophageal reflux disease)   . Seizures     dx as child with epilepsy but no seizures since 89 and on no meds  . Hypertension   . BV (bacterial vaginosis)   . HSV-2 (herpes simplex virus 2) infection   . Abnormal Pap smear   . Rheumatoid arthritis   . Allergic rhinitis    Current Outpatient Prescriptions on File Prior to Visit  Medication Sig Dispense Refill  . Biotin 1000 MCG tablet Take 1,000 mcg by mouth daily.      . Cholecalciferol (VITAMIN D-3) 1000 UNITS CAPS Take 1 capsule by mouth daily.      Marland Kitchen etanercept (ENBREL) 50 MG/ML injection Inject 50 mg into the skin once a week.      . folic acid (FOLVITE) 1 MG tablet Take 1 mg by mouth daily.      . hydrochlorothiazide (MICROZIDE) 12.5 MG capsule TAKE ONE CAPSULE BY MOUTH ONCE DAILY  30 capsule  0  . hydroxychloroquine (PLAQUENIL) 200 MG tablet Take 200 mg by mouth 2 (two) times daily.      . medroxyPROGESTERone (DEPO-PROVERA) 150 MG/ML injection INJECT 1 ML INTRAMUSCULARLY IN DOCTOR'S OFFICE EVERY 3 MONTHS.  1 mL  4  . methotrexate (RHEUMATREX) 2.5 MG tablet Take 7 tablets by mouth weekly  Caution:Chemotherapy. Protect from light. On Monday       . Omega-3 Fatty Acids (FISH OIL) 1000 MG CAPS Take 1,000 mg by mouth daily.      Marland Kitchen omeprazole (PRILOSEC) 20 MG capsule Take 1 capsule (20 mg total) by mouth daily.  30 capsule  11  . Probiotic Product (PROBIOTIC DAILY PO) Take 1 tablet by mouth daily.       No current facility-administered medications on file prior to visit.   Allergies  Allergen Reactions  . Penicillins    History   Social History  . Marital Status: Single    Spouse Name: N/A    Number of Children: 2  . Years of Education: N/A   Occupational History  . [employer]    Social History Main Topics  . Smoking status: Current Every Day Smoker -- 0.50 packs/day for 12 years    Types: Cigarettes  . Smokeless tobacco: Never Used  . Alcohol Use: Yes     Comment: social  . Drug Use: No  . Sexual Activity: Yes    Birth Control/ Protection: Injection     Comment: Depo Provera every 3 months   Other Topics Concern  . Not on file   Social History Narrative  . No narrative on file    No family history on file.    Review of  Systems  All other systems reviewed and are negative.       Objective:   Physical Exam  Vitals reviewed. Constitutional: She is oriented to person, place, and time. She appears well-developed and well-nourished. No distress.  HENT:  Head: Normocephalic and atraumatic.  Right Ear: External ear normal.  Left Ear: External ear normal.  Nose: Nose normal.  Mouth/Throat: Oropharynx is clear and moist. No oropharyngeal exudate.  Eyes: Conjunctivae and EOM are normal. Pupils are equal, round, and reactive to light. Right eye exhibits no discharge. Left eye exhibits no discharge. No scleral icterus.  Neck: Normal range of motion. Neck supple. No JVD present. No tracheal deviation present. No thyromegaly present.  Cardiovascular: Normal rate, regular rhythm, normal heart sounds and intact distal pulses.  Exam reveals no gallop and no friction rub.   No murmur  heard. Pulmonary/Chest: Effort normal and breath sounds normal. No stridor. No respiratory distress. She has no wheezes. She has no rales.  Abdominal: Soft. Bowel sounds are normal. She exhibits no distension. There is no tenderness. There is no rebound and no guarding.  Musculoskeletal: Normal range of motion. She exhibits no edema and no tenderness.  Lymphadenopathy:    She has no cervical adenopathy.  Neurological: She is alert and oriented to person, place, and time. She has normal reflexes. She displays normal reflexes. No cranial nerve deficit. She exhibits normal muscle tone. Coordination normal.  Skin: Skin is warm. Rash noted. She is not diaphoretic. There is erythema. No pallor.  Psychiatric: She has a normal mood and affect. Her behavior is normal. Judgment and thought content normal.          Assessment & Plan:  Acute rhinosinusitis - Plan: azithromycin (ZITHROMAX) 250 MG tablet  Routine general medical examination at a health care facility  patient's physical exam is completely normal except for her rosacea. The patient does have sinusitis. If her with a Z-Pak. Once her sinusitis has improved I recommend doxycycline to treat her rosacea. I've also asked the patient to return fasting for a CBC, CMP, fasting lipid panel, and TSH. Otherwise her immunizations are up to date. Her cancer screenings performed by her gynecologist. Her blood pressure is well controlled.

## 2014-02-17 ENCOUNTER — Other Ambulatory Visit: Payer: BC Managed Care – PPO

## 2014-02-17 DIAGNOSIS — Z0000 Encounter for general adult medical examination without abnormal findings: Secondary | ICD-10-CM

## 2014-02-17 LAB — CBC WITH DIFFERENTIAL/PLATELET
Basophils Absolute: 0 10*3/uL (ref 0.0–0.1)
Basophils Relative: 0 % (ref 0–1)
Eosinophils Absolute: 0 10*3/uL (ref 0.0–0.7)
Eosinophils Relative: 1 % (ref 0–5)
HCT: 40.1 % (ref 36.0–46.0)
Hemoglobin: 13.7 g/dL (ref 12.0–15.0)
Lymphocytes Relative: 28 % (ref 12–46)
Lymphs Abs: 1.4 10*3/uL (ref 0.7–4.0)
MCH: 31.7 pg (ref 26.0–34.0)
MCHC: 34.2 g/dL (ref 30.0–36.0)
MCV: 92.8 fL (ref 78.0–100.0)
Monocytes Absolute: 0.3 10*3/uL (ref 0.1–1.0)
Monocytes Relative: 7 % (ref 3–12)
Neutro Abs: 3.1 10*3/uL (ref 1.7–7.7)
Neutrophils Relative %: 64 % (ref 43–77)
Platelets: 239 10*3/uL (ref 150–400)
RBC: 4.32 MIL/uL (ref 3.87–5.11)
RDW: 13.4 % (ref 11.5–15.5)
WBC: 4.9 10*3/uL (ref 4.0–10.5)

## 2014-02-17 LAB — COMPLETE METABOLIC PANEL WITH GFR
ALT: 25 U/L (ref 0–35)
AST: 36 U/L (ref 0–37)
Albumin: 4.2 g/dL (ref 3.5–5.2)
Alkaline Phosphatase: 82 U/L (ref 39–117)
BUN: 12 mg/dL (ref 6–23)
CO2: 24 mEq/L (ref 19–32)
Calcium: 9.5 mg/dL (ref 8.4–10.5)
Chloride: 103 mEq/L (ref 96–112)
Creat: 0.85 mg/dL (ref 0.50–1.10)
GFR, Est African American: >89 mL/min
GFR, Est Non African American: >89 mL/min
Glucose, Bld: 90 mg/dL (ref 70–99)
Potassium: 4 mEq/L (ref 3.5–5.3)
Sodium: 140 mEq/L (ref 135–145)
Total Bilirubin: 0.5 mg/dL (ref 0.2–1.2)
Total Protein: 7.5 g/dL (ref 6.0–8.3)

## 2014-02-17 LAB — TSH: TSH: 1.705 u[IU]/mL (ref 0.350–4.500)

## 2014-02-17 LAB — LIPID PANEL
Cholesterol: 112 mg/dL (ref 0–200)
HDL: 33 mg/dL — ABNORMAL LOW (ref >39)
LDL Cholesterol: 23 mg/dL (ref 0–99)
Total CHOL/HDL Ratio: 3.4 Ratio
Triglycerides: 279 mg/dL — ABNORMAL HIGH (ref <150)
VLDL: 56 mg/dL — ABNORMAL HIGH (ref 0–40)

## 2014-02-20 ENCOUNTER — Telehealth: Payer: Self-pay | Admitting: Family Medicine

## 2014-02-20 NOTE — Telephone Encounter (Signed)
Call back number is [phone] Pharmacy [facility] Pt was told to call back after she finished on antibiotic and he was going to call in another one for the rash on her face

## 2014-02-23 MED ORDER — DOXYCYCLINE HYCLATE 50 MG PO CAPS: 50.0000 mg | ORAL_CAPSULE | Freq: Two times a day (BID) | ORAL | Status: DC

## 2014-02-23 NOTE — Telephone Encounter (Signed)
Please call out doxycycline 50 bid for 4 weeks for rosacea.  Follow up in 4 weeks to assess for improvement.

## 2014-02-23 NOTE — Telephone Encounter (Signed)
Pt aware fo med sent to pharm and need for f/u appt. per vm.

## 2014-03-15 ENCOUNTER — Other Ambulatory Visit: Payer: Self-pay | Admitting: Physician Assistant

## 2014-03-15 DIAGNOSIS — I10 Essential (primary) hypertension: Secondary | ICD-10-CM

## 2014-03-16 NOTE — Telephone Encounter (Signed)
Medication refilled per protocol. 

## 2014-03-24 ENCOUNTER — Ambulatory Visit: Payer: BC Managed Care – PPO | Admitting: Family Medicine

## 2014-05-12 ENCOUNTER — Ambulatory Visit (INDEPENDENT_AMBULATORY_CARE_PROVIDER_SITE_OTHER): Payer: BC Managed Care – PPO | Admitting: Obstetrics and Gynecology

## 2014-05-12 ENCOUNTER — Encounter: Payer: Self-pay | Admitting: Obstetrics and Gynecology

## 2014-05-12 VITALS — BP 136/82 | Ht 68.0 in | Wt 165.4 lb

## 2014-05-12 DIAGNOSIS — Z3049 Encounter for surveillance of other contraceptives: Secondary | ICD-10-CM

## 2014-05-12 DIAGNOSIS — Z309 Encounter for contraceptive management, unspecified: Secondary | ICD-10-CM

## 2014-05-12 DIAGNOSIS — Z3202 Encounter for pregnancy test, result negative: Secondary | ICD-10-CM

## 2014-05-12 LAB — POCT URINE PREGNANCY: Preg Test, Ur: NEGATIVE

## 2014-05-12 MED ORDER — MEDROXYPROGESTERONE ACETATE 150 MG/ML IM SUSP
150.0000 mg | Freq: Once | INTRAMUSCULAR | Status: AC
  Administered 2014-05-12: 150 mg via INTRAMUSCULAR

## 2014-05-12 NOTE — Progress Notes (Signed)
Patient ID: Samantha Rosales, female   DOB: 05-30-1981, 33 y.o.   MRN: [id number] Pt here today for Depo Provera injection, urine pregnancy test resulted negative.  Injection given in rt deltoid without complication.

## 2014-06-16 ENCOUNTER — Other Ambulatory Visit: Payer: Self-pay | Admitting: Family Medicine

## 2014-06-16 NOTE — Telephone Encounter (Signed)
Refill appropriate and filled per protocol. 

## 2014-07-02 ENCOUNTER — Ambulatory Visit: Payer: BC Managed Care – PPO | Admitting: Family Medicine

## 2014-07-03 ENCOUNTER — Ambulatory Visit (INDEPENDENT_AMBULATORY_CARE_PROVIDER_SITE_OTHER): Payer: BC Managed Care – PPO | Admitting: Family Medicine

## 2014-07-03 ENCOUNTER — Encounter: Payer: Self-pay | Admitting: Family Medicine

## 2014-07-03 VITALS — BP 122/76 | HR 98 | Temp 97.5°F | Resp 12 | Ht 68.0 in | Wt 167.0 lb

## 2014-07-03 DIAGNOSIS — J019 Acute sinusitis, unspecified: Secondary | ICD-10-CM

## 2014-07-03 MED ORDER — LEVOFLOXACIN 500 MG PO TABS: 500.0000 mg | ORAL_TABLET | Freq: Every day | ORAL | Status: DC

## 2014-07-03 MED ORDER — FLUTICASONE PROPIONATE 50 MCG/ACT NA SUSP: 2.0000 | Freq: Every day | NASAL | Status: DC

## 2014-07-03 NOTE — Progress Notes (Signed)
Subjective:    Patient ID: Samantha Rosales, female    DOB: 1981/01/03, 33 y.o.   MRN: [id number]  HPI Patient states that she has not felt right for last 2 weeks. She does have some pain in her left maxilla and left frontal sinus area. She also has a fluidlike sensation in her left ear. She states her left ear. Like there is fluid Behind it. On examination today there is a slight effusion in her left ear otherwise her left ear dry appears normal. She also reports some dizziness. She denies any vertigo. She has a negative Dix-Hallpike maneuver. He denies any fevers. She denies any vision changes. She denies any hearing changes. He denies any tinnitus. She denies any nausea or vomiting. She does report nasal congestion. Past Medical History  Diagnosis Date  . GERD (gastroesophageal reflux disease)   . Seizures     dx as child with epilepsy but no seizures since 89 and on no meds  . Hypertension   . BV (bacterial vaginosis)   . HSV-2 (herpes simplex virus 2) infection   . Abnormal Pap smear   . Rheumatoid arthritis   . Allergic rhinitis    Current Outpatient Prescriptions on File Prior to Visit  Medication Sig Dispense Refill  . Biotin 1000 MCG tablet Take 1,000 mcg by mouth daily.      . Cholecalciferol (VITAMIN D-3) 1000 UNITS CAPS Take 1 capsule by mouth daily.      Marland Kitchen doxycycline (VIBRAMYCIN) 50 MG capsule Take 1 capsule (50 mg total) by mouth 2 (two) times daily.  60 capsule  0  . etanercept (ENBREL) 50 MG/ML injection Inject 50 mg into the skin once a week.      . folic acid (FOLVITE) 1 MG tablet Take 1 mg by mouth daily.      . hydrochlorothiazide (MICROZIDE) 12.5 MG capsule TAKE ONE CAPSULE BY MOUTH ONCE DAILY  30 capsule  6  . hydroxychloroquine (PLAQUENIL) 200 MG tablet Take 200 mg by mouth 2 (two) times daily.      . medroxyPROGESTERone (DEPO-PROVERA) 150 MG/ML injection INJECT 1 ML INTRAMUSCULARLY IN DOCTOR'S OFFICE EVERY 3 MONTHS.  1 mL  4  . methotrexate (RHEUMATREX) 2.5 MG  tablet Take 7 tablets by mouth weekly  Caution:Chemotherapy. Protect from light. On Monday      . Omega-3 Fatty Acids (FISH OIL) 1000 MG CAPS Take 1,000 mg by mouth daily.      Marland Kitchen omeprazole (PRILOSEC) 20 MG capsule TAKE ONE CAPSULE BY MOUTH ONCE DAILY  30 capsule  11  . Probiotic Product (PROBIOTIC DAILY PO) Take 1 tablet by mouth daily.       No current facility-administered medications on file prior to visit.   Allergies  Allergen Reactions  . Penicillins    History   Social History  . Marital Status: Single    Spouse Name: N/A    Number of Children: 2  . Years of Education: N/A   Occupational History  . [employer]    Social History Main Topics  . Smoking status: Current Every Day Smoker -- 0.50 packs/day for 12 years    Types: Cigarettes  . Smokeless tobacco: Never Used  . Alcohol Use: Yes     Comment: social  . Drug Use: No  . Sexual Activity: Yes    Birth Control/ Protection: Injection     Comment: Depo Provera every 3 months   Other Topics Concern  . Not on file   Social  History Narrative  . No narrative on file      Review of Systems  All other systems reviewed and are negative.      Objective:   Physical Exam  Vitals reviewed. Constitutional: She appears well-developed and well-nourished.  HENT:  Right Ear: Tympanic membrane, external ear and ear canal normal.  Left Ear: External ear normal. A middle ear effusion is present.  Nose: Mucosal edema and rhinorrhea present. Right sinus exhibits no maxillary sinus tenderness and no frontal sinus tenderness. Left sinus exhibits maxillary sinus tenderness and frontal sinus tenderness.  Mouth/Throat: Oropharynx is clear and moist. No oropharyngeal exudate.  Eyes: Conjunctivae are normal. Pupils are equal, round, and reactive to light. No scleral icterus.  Neck: Normal range of motion. Neck supple. No thyromegaly present.  Cardiovascular: Normal rate, regular rhythm and normal heart  sounds.   No murmur heard. Pulmonary/Chest: Effort normal and breath sounds normal. No respiratory distress. She has no wheezes. She has no rales.  Lymphadenopathy:    She has no cervical adenopathy.          Assessment & Plan:  1. Acute rhinosinusitis Patient's Dix-Hallpike maneuver is negative today. I am unsure of the cause of her symptoms but I believe she may have a sinus infection. They're frontal and start the patient on Levaquin 500 mg by mouth daily for 7 days. I also give her Flonase 2 sprays each nostril daily for eustachian tube dysfunction. Recheck in 1 week if no better or sooner if worse. - levofloxacin (LEVAQUIN) 500 MG tablet; Take 1 tablet (500 mg total) by mouth daily.  Dispense: 7 tablet; Refill: 0 - fluticasone (FLONASE) 50 MCG/ACT nasal spray; Place 2 sprays into both nostrils daily.  Dispense: 16 g; Refill: 6

## 2014-07-10 ENCOUNTER — Telehealth: Payer: Self-pay | Admitting: Family Medicine

## 2014-07-10 NOTE — Telephone Encounter (Signed)
Call placed to patient to inquire. LMTRC. 

## 2014-07-10 NOTE — Telephone Encounter (Signed)
[phone]  PT started antibiotic on Monday and has 2 days left and she is still feeling off balance sometimes not all the time what does she need to do??

## 2014-07-11 NOTE — Telephone Encounter (Signed)
Call placed to patient.   States that she still has some slight dizziness when she stands up.   Advised that ABTx needs time to work, but if she is not feeling better by Monday to call us back.

## 2014-07-20 ENCOUNTER — Telehealth: Payer: Self-pay | Admitting: Family Medicine

## 2014-07-20 DIAGNOSIS — H6983 Other specified disorders of Eustachian tube, bilateral: Secondary | ICD-10-CM

## 2014-07-20 NOTE — Telephone Encounter (Signed)
Patient would like a call from nurse, she says she is still having dizzy spells please call her back at [phone]

## 2014-07-21 NOTE — Telephone Encounter (Signed)
I would recommend an ENT eval for eustachian tube dysfunction.

## 2014-07-21 NOTE — Telephone Encounter (Signed)
Pt states that she is finished with the antibiotic and she is still feeling like she has water behind her ear and having some dizzy spells. She did take Mucinex last PM and feels better today. What else can she do or does she need a f/u appt.?

## 2014-07-21 NOTE — Telephone Encounter (Signed)
LMTRC

## 2014-07-24 NOTE — Telephone Encounter (Signed)
lmtrc

## 2014-08-01 NOTE — Telephone Encounter (Signed)
Call placed to patient and patient made aware.   States that she would like referral to ENT.   Orders placed.

## 2014-08-03 ENCOUNTER — Other Ambulatory Visit: Payer: Self-pay | Admitting: Adult Health

## 2014-08-07 ENCOUNTER — Ambulatory Visit (INDEPENDENT_AMBULATORY_CARE_PROVIDER_SITE_OTHER): Payer: BC Managed Care – PPO | Admitting: Obstetrics and Gynecology

## 2014-08-07 ENCOUNTER — Encounter: Payer: Self-pay | Admitting: Obstetrics and Gynecology

## 2014-08-07 DIAGNOSIS — Z3202 Encounter for pregnancy test, result negative: Secondary | ICD-10-CM

## 2014-08-07 DIAGNOSIS — Z3049 Encounter for surveillance of other contraceptives: Secondary | ICD-10-CM

## 2014-08-07 DIAGNOSIS — Z3042 Encounter for surveillance of injectable contraceptive: Secondary | ICD-10-CM

## 2014-08-07 LAB — POCT URINE PREGNANCY: Preg Test, Ur: NEGATIVE

## 2014-08-07 MED ORDER — MEDROXYPROGESTERONE ACETATE 150 MG/ML IM SUSP
150.0000 mg | Freq: Once | INTRAMUSCULAR | Status: AC
  Administered 2014-08-07: 150 mg via INTRAMUSCULAR

## 2014-08-20 ENCOUNTER — Ambulatory Visit (INDEPENDENT_AMBULATORY_CARE_PROVIDER_SITE_OTHER): Payer: BC Managed Care – PPO | Admitting: Otolaryngology

## 2014-08-20 DIAGNOSIS — H6980 Other specified disorders of Eustachian tube, unspecified ear: Secondary | ICD-10-CM

## 2014-08-20 DIAGNOSIS — H93299 Other abnormal auditory perceptions, unspecified ear: Secondary | ICD-10-CM

## 2014-10-01 ENCOUNTER — Ambulatory Visit (INDEPENDENT_AMBULATORY_CARE_PROVIDER_SITE_OTHER): Payer: BC Managed Care – PPO | Admitting: Otolaryngology

## 2014-10-01 DIAGNOSIS — H6983 Other specified disorders of Eustachian tube, bilateral: Secondary | ICD-10-CM

## 2014-10-05 ENCOUNTER — Encounter: Payer: Self-pay | Admitting: Obstetrics and Gynecology

## 2014-10-23 ENCOUNTER — Ambulatory Visit: Payer: BC Managed Care – PPO

## 2014-11-19 ENCOUNTER — Encounter: Payer: Self-pay | Admitting: Adult Health

## 2014-11-19 ENCOUNTER — Ambulatory Visit (INDEPENDENT_AMBULATORY_CARE_PROVIDER_SITE_OTHER): Payer: BC Managed Care – PPO | Admitting: Adult Health

## 2014-11-19 VITALS — BP 120/78 | HR 76 | Ht 67.75 in | Wt 173.5 lb

## 2014-11-19 DIAGNOSIS — Z01419 Encounter for gynecological examination (general) (routine) without abnormal findings: Secondary | ICD-10-CM

## 2014-11-19 DIAGNOSIS — Z3042 Encounter for surveillance of injectable contraceptive: Secondary | ICD-10-CM

## 2014-11-19 DIAGNOSIS — Z3202 Encounter for pregnancy test, result negative: Secondary | ICD-10-CM

## 2014-11-19 DIAGNOSIS — L0292 Furuncle, unspecified: Secondary | ICD-10-CM

## 2014-11-19 LAB — POCT URINE PREGNANCY: Preg Test, Ur: NEGATIVE

## 2014-11-19 MED ORDER — SULFAMETHOXAZOLE-TRIMETHOPRIM 800-160 MG PO TABS
1.0000 | ORAL_TABLET | Freq: Two times a day (BID) | ORAL | Status: DC
Start: 2014-11-19 — End: 2014-12-10

## 2014-11-19 MED ORDER — MEDROXYPROGESTERONE ACETATE 150 MG/ML IM SUSP
150.0000 mg | Freq: Once | INTRAMUSCULAR | Status: AC
  Administered 2014-11-19: 150 mg via INTRAMUSCULAR

## 2014-11-19 NOTE — Progress Notes (Signed)
Patient ID: Samantha Rosales, female   DOB: 1980-12-08, 33 y.o.   MRN: [id number] History of Present Illness: Samantha Rosales is a 33 year old black female in for gyn exam and get depo.She is complaining of left breast pain at times and ?boil left labia.She had normal pap with negative HPV 11/18/13.   Current Medications, Allergies, Past Medical History, Past Surgical History, Family History and Social History were reviewed in [employer] record.     Review of Systems: Patient denies any headaches, blurred vision, shortness of breath, chest pain, abdominal pain, problems with bowel movements, urination, or intercourse. No joint pain or mood swings,see HPI.    Physical Exam:BP 120/78 mmHg  Pulse 76  Ht 5' 7.75" (1.721 m)  Wt 173 lb 8 oz (78.699 kg)  BMI 26.57 kg/m2 General:  Well developed, well nourished, no acute distress Skin:  Warm and dry Neck:  Midline trachea, normal thyroid Lungs; Clear to auscultation bilaterally Breast:  No dominant palpable mass, retraction, or nipple discharge Cardiovascular: Regular rate and rhythm Abdomen:  Soft, non tender, no hepatosplenomegaly Pelvic:  External genitalia is normal in appearance, except has boil left labia,it is draining purulent discharge.  The vagina is normal in appearance.The cervix is bulbous.  Uterus is felt to be normal size, shape, and contour.  No adnexal masses or tenderness noted. Extremities:  No swelling or varicosities noted Psych:  No mood changes,alert and cooperative,seems happy   Impression: Well woman gyn exam no pap(has family planning medicaid and BCBS) Contraceptive management Boil left labia    Plan: Rx septra ds 1 bid x 14 days #28 with 1 refill, use warm compresses and do not shave Return in 2 weeks if not better Depo in 12 weeks Physical in 1 year HIV and RPR and GC/CHL sent

## 2014-11-19 NOTE — Patient Instructions (Signed)
Use warm compress to area Take septra ds Recheck in 2 weeks if not better Abscess An abscess is an infected area that contains a collection of pus and debris.It can occur in almost any part of the body. An abscess is also known as a furuncle or boil. CAUSES  An abscess occurs when tissue gets infected. This can occur from blockage of oil or sweat glands, infection of hair follicles, or a minor injury to the skin. As the body tries to fight the infection, pus collects in the area and creates pressure under the skin. This pressure causes pain. People with weakened immune systems have difficulty fighting infections and get certain abscesses more often.  SYMPTOMS Usually an abscess develops on the skin and becomes a painful mass that is red, warm, and tender. If the abscess forms under the skin, you may feel a moveable soft area under the skin. Some abscesses break open (rupture) on their own, but most will continue to get worse without care. The infection can spread deeper into the body and eventually into the bloodstream, causing you to feel ill.  DIAGNOSIS  Your caregiver will take your medical history and perform a physical exam. A sample of fluid may also be taken from the abscess to determine what is causing your infection. TREATMENT  Your caregiver may prescribe antibiotic medicines to fight the infection. However, taking antibiotics alone usually does not cure an abscess. Your caregiver may need to make a small cut (incision) in the abscess to drain the pus. In some cases, gauze is packed into the abscess to reduce pain and to continue draining the area. HOME CARE INSTRUCTIONS   Only take over-the-counter or prescription medicines for pain, discomfort, or fever as directed by your caregiver.  If you were prescribed antibiotics, take them as directed. Finish them even if you start to feel better.  If gauze is used, follow your caregiver's directions for changing the gauze.  To avoid  spreading the infection:  Keep your draining abscess covered with a bandage.  Wash your hands well.  Do not share personal care items, towels, or whirlpools with others.  Avoid skin contact with others.  Keep your skin and clothes clean around the abscess.  Keep all follow-up appointments as directed by your caregiver. SEEK MEDICAL CARE IF:   You have increased pain, swelling, redness, fluid drainage, or bleeding.  You have muscle aches, chills, or a general ill feeling.  You have a fever. MAKE SURE YOU:   Understand these instructions.  Will watch your condition.  Will get help right away if you are not doing well or get worse. Document Released: 08/30/2005 Document Revised: 05/21/2012 Document Reviewed: 02/02/2012 [facility] Patient Information 2015 [place], [place]. This information is not intended to replace advice given to you by your health care provider. Make sure you discuss any questions you have with your health care provider.  Depo in 12 weeks Physical in 1 year

## 2014-11-20 ENCOUNTER — Other Ambulatory Visit: Payer: BC Managed Care – PPO | Admitting: Adult Health

## 2014-11-20 LAB — GC/CHLAMYDIA PROBE AMP
CT Probe RNA: NEGATIVE
GC Probe RNA: NEGATIVE

## 2014-11-20 LAB — RPR

## 2014-11-20 LAB — HIV ANTIBODY (ROUTINE TESTING W REFLEX): HIV 1&2 Ab, 4th Generation: NONREACTIVE

## 2014-11-23 ENCOUNTER — Ambulatory Visit: Payer: BC Managed Care – PPO

## 2014-12-03 ENCOUNTER — Ambulatory Visit: Payer: BC Managed Care – PPO | Admitting: Adult Health

## 2014-12-10 ENCOUNTER — Ambulatory Visit (INDEPENDENT_AMBULATORY_CARE_PROVIDER_SITE_OTHER): Payer: BLUE CROSS/BLUE SHIELD | Admitting: Physician Assistant

## 2014-12-10 ENCOUNTER — Encounter: Payer: Self-pay | Admitting: Physician Assistant

## 2014-12-10 VITALS — BP 124/80 | HR 96 | Temp 98.3°F | Resp 18 | Wt 171.0 lb

## 2014-12-10 DIAGNOSIS — M542 Cervicalgia: Secondary | ICD-10-CM

## 2014-12-10 DIAGNOSIS — R202 Paresthesia of skin: Secondary | ICD-10-CM

## 2014-12-10 NOTE — Progress Notes (Signed)
Patient ID: Samantha Rosales MRN: [id number], DOB: 1981/01/08, 34 y.o. Date of Encounter: 12/10/2014, 5:20 PM    Chief Complaint:  Chief Complaint  Patient presents with  . left shoulder pain    lt arm go to sleep at night, funny feeeling lt side face     HPI: 34 y.o. year old AA female resents with above complaints.  States that sometimes the muscles in the back of her left neck towards her shoulder blade feel tight and sore.  States that sometimes at night she wakes up from sleep with her left arm feeling like it is "asleep". She shakes that around and moves it and the sensation resolves and goes back to normal.  States that during the day she has no pain going down the left arm. During the day she has no numbness or tingling or weakness going down the left arm.  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 She is right handed.  Says that for years she has had problems with occasionally feeling a funny sensation on her left cheek as if something is crawling on the skin there. Says this happens at random times. Says that it is a very faint light sensation when it does occur.  No other complaints or concerns. No other associated symptoms.    Home Meds:   Outpatient Prescriptions Prior to Visit  Medication Sig Dispense Refill  . Biotin 1000 MCG tablet Take 1,000 mcg by mouth daily.    . Cholecalciferol (VITAMIN D-3) 1000 UNITS CAPS Take 1 capsule by mouth daily.    Marland Kitchen etanercept (ENBREL) 50 MG/ML injection Inject 50 mg into the skin once a week.    . fluticasone (FLONASE) 50 MCG/ACT nasal spray Place 2 sprays into both nostrils daily. 16 g 6  . folic acid (FOLVITE) 1 MG tablet Take 1 mg by mouth daily.    . hydrochlorothiazide (MICROZIDE) 12.5 MG capsule TAKE ONE CAPSULE BY MOUTH ONCE DAILY 30 capsule 6  . hydroxychloroquine (PLAQUENIL) 200 MG tablet Take 200 mg by mouth 2 (two) times daily.    . medroxyPROGESTERone (DEPO-PROVERA) 150 MG/ML injection INJECT 1 ML INTRAMUSCULARLY IN  DOCTOR'S OFFICE EVERY 3 MONTHS. 1 mL 3  . methotrexate (RHEUMATREX) 2.5 MG tablet Take 7 tablets by mouth weekly  Caution:Chemotherapy. Protect from light. On Monday    . Omega-3 Fatty Acids (FISH OIL) 1000 MG CAPS Take 1,000 mg by mouth daily.    Marland Kitchen omeprazole (PRILOSEC) 20 MG capsule TAKE ONE CAPSULE BY MOUTH ONCE DAILY 30 capsule 11  . sulfamethoxazole-trimethoprim (BACTRIM DS,SEPTRA DS) 800-160 MG per tablet Take 1 tablet by mouth 2 (two) times daily. 28 tablet 1   No facility-administered medications prior to visit.    Allergies:  Allergies  Allergen Reactions  . Penicillins       Review of Systems: See HPI for pertinent ROS. All other ROS negative.    Physical Exam: Blood pressure 124/80, pulse 96, temperature 98.3 F (36.8 C), temperature source Oral, resp. rate 18, weight 171 lb (77.565 kg)., Body mass index is 26.19 kg/(m^2). General:  WNWD AAF. Appears in no acute distress. HEENT: No tenderness with palpation of the TM joint on the left. No tenderness or abnormal sensation when I lightly touch the left cheek. Neck: Supple. No thyromegaly. No lymphadenopathy. Lungs: Clear bilaterally to auscultation without wheezes, rales, or rhonchi. Breathing is unlabored. Heart: Regular rhythm. No murmurs, rubs, or gallops. Msk:  Strength and tone normal for age. Left neck--there is positive tenderness with  palpation of the left neck over the trapezius as well as muscles towards the left scapula. I palpated bilaterally, she reports that the left side is more tender and tight feeling than the right. Full range of motion of the neck. 5/5 strength of the left arm as well as left grip strength. Tinels and Phalens are negative/ normal.  Extremities/Skin: Warm and dry. Neuro: Alert and oriented X 3. Moves all extremities spontaneously. Gait is normal. CNII-XII grossly in tact. Psych:  Responds to questions appropriately with a normal affect.     ASSESSMENT AND PLAN:  34 y.o. year old  female with  1. Neck pain on left side  2. Paresthesia of left arm  Reassured her that I think that the discomfort she is having up in the left neck and shoulder blade area are all just secondary to muscle strain and muscle tension. Told to stretch her neck and shoulder blades throughout the day and range of motion throughout the day of those areas.  Reassured her that I think that the paresthesias of the left arm with sleep are all related to her position when she is sleeping and compression of nerve while she is sleeping. She states that she does sleep on her left side balled up in a fetal position. Told her to try to adjust her sleep position.  Regarding the sensation that she sometimes feels on her left cheek--could  possibly be a very mild case of trigeminal neuralgia. Also she is on medications for arthritis--- told her that her next appointment with her rheumatologist to let them know of the symptoms to see whether they could be related to side effects from the medications possibly.   Signed, [address] [place], [place], [facility] 12/10/2014 5:20 PM

## 2014-12-18 ENCOUNTER — Other Ambulatory Visit: Payer: BLUE CROSS/BLUE SHIELD

## 2015-02-11 ENCOUNTER — Ambulatory Visit (INDEPENDENT_AMBULATORY_CARE_PROVIDER_SITE_OTHER): Payer: BLUE CROSS/BLUE SHIELD | Admitting: *Deleted

## 2015-02-11 ENCOUNTER — Encounter: Payer: Self-pay | Admitting: *Deleted

## 2015-02-11 DIAGNOSIS — Z3042 Encounter for surveillance of injectable contraceptive: Secondary | ICD-10-CM

## 2015-02-11 DIAGNOSIS — Z3202 Encounter for pregnancy test, result negative: Secondary | ICD-10-CM | POA: Diagnosis not present

## 2015-02-11 LAB — POCT URINE PREGNANCY: Preg Test, Ur: NEGATIVE

## 2015-02-11 MED ORDER — MEDROXYPROGESTERONE ACETATE 150 MG/ML IM SUSP
150.0000 mg | Freq: Once | INTRAMUSCULAR | Status: AC
  Administered 2015-02-11: 150 mg via INTRAMUSCULAR

## 2015-02-11 NOTE — Progress Notes (Signed)
Pt here for Depo. Reports no problems at this time. Return in 12 weeks for next shot. JSY 

## 2015-02-24 DIAGNOSIS — M0579 Rheumatoid arthritis with rheumatoid factor of multiple sites without organ or systems involvement: Secondary | ICD-10-CM | POA: Insufficient documentation

## 2015-03-04 ENCOUNTER — Other Ambulatory Visit: Payer: Self-pay | Admitting: Family Medicine

## 2015-03-04 NOTE — Telephone Encounter (Signed)
Refill appropriate and filled per protocol. 

## 2015-04-30 ENCOUNTER — Encounter: Payer: Self-pay | Admitting: Family Medicine

## 2015-04-30 ENCOUNTER — Ambulatory Visit (INDEPENDENT_AMBULATORY_CARE_PROVIDER_SITE_OTHER): Payer: BLUE CROSS/BLUE SHIELD | Admitting: Family Medicine

## 2015-04-30 VITALS — BP 118/72 | HR 84 | Temp 98.7°F | Resp 20 | Ht 68.0 in | Wt 176.0 lb

## 2015-04-30 DIAGNOSIS — Z0000 Encounter for general adult medical examination without abnormal findings: Secondary | ICD-10-CM

## 2015-04-30 DIAGNOSIS — R5382 Chronic fatigue, unspecified: Secondary | ICD-10-CM | POA: Diagnosis not present

## 2015-04-30 NOTE — Progress Notes (Signed)
Subjective:    Patient ID: Samantha Rosales, female    DOB: 1981/09/21, 34 y.o.   MRN: [id number]  HPI Patient is here today for complete physical exam. She is a very pleasant 34 year old African-American female. Her biggest medical concern is persistent fatigue. She is currently under treatment for rheumatoid arthritis with a combination of Enbrel as well as methotrexate. She states that her joint pains are well controlled and she is interested in weaning off of methotrexate. This may potentially help with fatigue. Unfortunately she also continues to smoke. She is not exercising regularly. She is approximately 10 pounds over her ideal body weight. Patient sees her gynecologist for a Pap smear and pelvic exam. She is currently on Depo-Provera for birth control. She's been on Depo-Provera for over 5 years. We had a discussion today regarding the increased risk of osteoporosis with prolonged Depo-Provera use. She may want consider switching birth control versus supplementing with calcium and vitamin D to help protect her bones. I recommended she discuss this with her gynecologist. Otherwise she is doing well. She had a CBC as well as a CMP checked with her rheumatologist earlier this week and was normal. Past Medical History  Diagnosis Date  . GERD (gastroesophageal reflux disease)   . Seizures     dx as child with epilepsy but no seizures since 89 and on no meds  . Hypertension   . BV (bacterial vaginosis)   . HSV-2 (herpes simplex virus 2) infection   . Abnormal Pap smear   . Rheumatoid arthritis   . Allergic rhinitis   . Vaginal Pap smear, abnormal    Past Surgical History  Procedure Laterality Date  . Cervical conization w/bx  12/20/2012    Procedure: CONIZATION CERVIX WITH BIOPSY;  Surgeon: Lazaro Arms, MD;  Location: [facility];  Service: Gynecology;  Laterality: N/A;  . Holmium laser application  12/20/2012    Procedure: HOLMIUM LASER APPLICATION;  Surgeon: Lazaro Arms, MD;  Location: [facility];  Service: Gynecology;  Laterality: N/A;   Current Outpatient Prescriptions on File Prior to Visit  Medication Sig Dispense Refill  . Biotin 1000 MCG tablet Take 1,000 mcg by mouth daily.    . Cholecalciferol (VITAMIN D-3) 1000 UNITS CAPS Take 1 capsule by mouth daily.    Marland Kitchen etanercept (ENBREL) 50 MG/ML injection Inject 50 mg into the skin once a week.    . fluticasone (FLONASE) 50 MCG/ACT nasal spray Place 2 sprays into both nostrils daily. 16 g 6  . folic acid (FOLVITE) 1 MG tablet Take 1 mg by mouth daily.    . hydrochlorothiazide (MICROZIDE) 12.5 MG capsule TAKE ONE CAPSULE BY MOUTH ONCE DAILY 30 capsule 3  . hydroxychloroquine (PLAQUENIL) 200 MG tablet Take 200 mg by mouth 2 (two) times daily.    . medroxyPROGESTERone (DEPO-PROVERA) 150 MG/ML injection INJECT 1 ML INTRAMUSCULARLY IN DOCTOR'S OFFICE EVERY 3 MONTHS. 1 mL 3  . methotrexate (RHEUMATREX) 2.5 MG tablet Take 6 tablets by mouth weekly  Caution:Chemotherapy. Protect from light. On Monday    . Omega-3 Fatty Acids (FISH OIL) 1000 MG CAPS Take 1,000 mg by mouth daily.    Marland Kitchen omeprazole (PRILOSEC) 20 MG capsule TAKE ONE CAPSULE BY MOUTH ONCE DAILY 30 capsule 11   No current facility-administered medications on file prior to visit.   Allergies  Allergen Reactions  . Penicillins    History   Social History  . Marital Status: Single    Spouse Name: N/A  . Number  of Children: 2  . Years of Education: N/A   Occupational History  . [employer]    Social History Main Topics  . Smoking status: Current Every Day Smoker -- 0.50 packs/day for 15 years    Types: Cigarettes  . Smokeless tobacco: Never Used  . Alcohol Use: Yes     Comment: social  . Drug Use: No  . Sexual Activity: Yes    Birth Control/ Protection: Injection     Comment: Depo Provera every 3 months   Other Topics Concern  . Not on file   Social History Narrative   Family History  Problem Relation Age of Onset  . Hypertension Mother    . Hypertension Father   . Cancer Paternal Uncle     pancreatic cancer      Review of Systems  All other systems reviewed and are negative.      Objective:   Physical Exam  Constitutional: She is oriented to person, place, and time. She appears well-developed and well-nourished. No distress.  HENT:  Head: Normocephalic and atraumatic.  Right Ear: External ear normal.  Left Ear: External ear normal.  Nose: Nose normal.  Mouth/Throat: Oropharynx is clear and moist. No oropharyngeal exudate.  Eyes: Conjunctivae and EOM are normal. Pupils are equal, round, and reactive to light. Right eye exhibits no discharge. Left eye exhibits no discharge. No scleral icterus.  Neck: Normal range of motion. Neck supple. No JVD present. No tracheal deviation present. No thyromegaly present.  Cardiovascular: Normal rate, regular rhythm, normal heart sounds and intact distal pulses.  Exam reveals no gallop and no friction rub.   No murmur heard. Pulmonary/Chest: Effort normal and breath sounds normal. No stridor. No respiratory distress. She has no wheezes. She has no rales. She exhibits no tenderness.  Abdominal: Soft. Bowel sounds are normal. She exhibits no distension and no mass. There is no tenderness. There is no rebound and no guarding.  Musculoskeletal: Normal range of motion. She exhibits no edema or tenderness.  Lymphadenopathy:    She has no cervical adenopathy.  Neurological: She is alert and oriented to person, place, and time. She has normal reflexes. She displays normal reflexes. No cranial nerve deficit. She exhibits normal muscle tone. Coordination normal.  Skin: Skin is warm. No rash noted. She is not diaphoretic. No erythema. No pallor.  Psychiatric: She has a normal mood and affect. Her behavior is normal. Judgment and thought content normal.  Vitals reviewed.         Assessment & Plan:  Routine general medical examination at a health care facility - Plan: Lipid  panel  Chronic fatigue - Plan: TSH, Vit D  25 hydroxy (rtn osteoporosis monitoring), Vitamin B12  Patient's physical exam is normal. I recommended 10 pounds weight loss. I recommended smoking cessation. I believe that these 2 items would help treat some of the patient's fatigue. Also she can wean down and eventually off the methotrexate I believe that this would help as well. I will also asked the patient to return fasting so that I can check a lipid panel to monitor her cholesterol as she has a history of hypertriglyceridemia and low HDL cholesterol. Given her fatigue I will also check a vitamin B12, TSH, and a vitamin D level. I recommended she discuss with her rheumatologist gradually weaning off methotrexate if possible and also discuss with her gynecologist other alternatives for birth control such as an IUD.

## 2015-05-06 ENCOUNTER — Ambulatory Visit: Payer: BLUE CROSS/BLUE SHIELD

## 2015-05-07 ENCOUNTER — Encounter: Payer: Self-pay | Admitting: *Deleted

## 2015-05-07 ENCOUNTER — Ambulatory Visit (INDEPENDENT_AMBULATORY_CARE_PROVIDER_SITE_OTHER): Payer: BLUE CROSS/BLUE SHIELD | Admitting: *Deleted

## 2015-05-07 ENCOUNTER — Other Ambulatory Visit: Payer: BLUE CROSS/BLUE SHIELD

## 2015-05-07 DIAGNOSIS — Z3042 Encounter for surveillance of injectable contraceptive: Secondary | ICD-10-CM

## 2015-05-07 DIAGNOSIS — Z3202 Encounter for pregnancy test, result negative: Secondary | ICD-10-CM | POA: Diagnosis not present

## 2015-05-07 LAB — LIPID PANEL
Cholesterol: 129 mg/dL (ref 0–200)
HDL: 38 mg/dL — ABNORMAL LOW (ref >=46)
LDL Cholesterol: 49 mg/dL (ref 0–99)
Total CHOL/HDL Ratio: 3.4 Ratio
Triglycerides: 208 mg/dL — ABNORMAL HIGH (ref <150)
VLDL: 42 mg/dL — ABNORMAL HIGH (ref 0–40)

## 2015-05-07 LAB — VITAMIN B12: Vitamin B-12: 927 pg/mL — ABNORMAL HIGH (ref 211–911)

## 2015-05-07 LAB — TSH: TSH: 1.522 u[IU]/mL (ref 0.350–4.500)

## 2015-05-07 LAB — POCT URINE PREGNANCY: Preg Test, Ur: NEGATIVE

## 2015-05-07 MED ORDER — MEDROXYPROGESTERONE ACETATE 150 MG/ML IM SUSP
150.0000 mg | Freq: Once | INTRAMUSCULAR | Status: AC
  Administered 2015-05-07: 150 mg via INTRAMUSCULAR

## 2015-05-07 NOTE — Progress Notes (Signed)
Pt here for Depo. Reports having light spotting the week before Depo is due. I advised that can be common and usually the Depo stops the spotting. If it don't, call office. Also, pt mentioned changing to a different birth control method. I advised her to schedule an appt to see one of the providers to discuss options. Pt voiced understanding. JSY

## 2015-05-08 LAB — VITAMIN D 25 HYDROXY (VIT D DEFICIENCY, FRACTURES): Vit D, 25-Hydroxy: 37 ng/mL (ref 30–100)

## 2015-05-12 ENCOUNTER — Encounter: Payer: Self-pay | Admitting: Family Medicine

## 2015-06-14 DIAGNOSIS — Z79899 Other long term (current) drug therapy: Secondary | ICD-10-CM | POA: Insufficient documentation

## 2015-06-18 ENCOUNTER — Ambulatory Visit (INDEPENDENT_AMBULATORY_CARE_PROVIDER_SITE_OTHER): Payer: BLUE CROSS/BLUE SHIELD | Admitting: Obstetrics & Gynecology

## 2015-06-18 ENCOUNTER — Encounter: Payer: Self-pay | Admitting: Obstetrics & Gynecology

## 2015-06-18 ENCOUNTER — Ambulatory Visit: Payer: BLUE CROSS/BLUE SHIELD | Admitting: Obstetrics & Gynecology

## 2015-06-18 VITALS — BP 118/76 | HR 96 | Ht 68.0 in | Wt 174.5 lb

## 2015-06-18 DIAGNOSIS — Z3009 Encounter for other general counseling and advice on contraception: Secondary | ICD-10-CM | POA: Diagnosis not present

## 2015-06-28 NOTE — Progress Notes (Signed)
Patient ID: Samantha Rosales, female   DOB: 08-Oct-1981, 34 y.o.   MRN: [id number] Chief Complaint  Patient presents with  . discuss birth control options    Blood pressure 118/76, pulse 96, height 5\' 8"  (1.727 m), weight 174 lb 8 oz (79.153 kg).  Patient receiving Depo-Provera injections Is interested in changing over to something different We discussed options including permanent sterilization and IUD Nexplanon birth control pills nuva ring Of course she has some additional issues because her collagen vascular disease  She's been having irregular spotting because of the Depo-Provera and really wants that to stop  She will consider her options All questions were answered She is leaning toward something more definitive     Face to face time:  10 minutes  Greater than 50% of the visit time was spent in counseling and coordination of care with the patient.  The summary and outline of the counseling and care coordination is summarized in the note above.   All questions were answered.

## 2015-07-16 ENCOUNTER — Other Ambulatory Visit: Payer: Self-pay | Admitting: Family Medicine

## 2015-07-19 ENCOUNTER — Other Ambulatory Visit: Payer: Self-pay | Admitting: Adult Health

## 2015-07-19 MED ORDER — OMEPRAZOLE 20 MG PO CPDR: 20.0000 mg | DELAYED_RELEASE_CAPSULE | Freq: Every day | ORAL | Status: DC

## 2015-07-19 NOTE — Addendum Note (Signed)
Addended by: Legrand Rams B on: 07/19/2015 08:34 AM   Modules accepted: Orders

## 2015-07-23 ENCOUNTER — Ambulatory Visit (INDEPENDENT_AMBULATORY_CARE_PROVIDER_SITE_OTHER): Payer: BLUE CROSS/BLUE SHIELD | Admitting: *Deleted

## 2015-07-23 ENCOUNTER — Encounter: Payer: Self-pay | Admitting: *Deleted

## 2015-07-23 DIAGNOSIS — Z3042 Encounter for surveillance of injectable contraceptive: Secondary | ICD-10-CM | POA: Diagnosis not present

## 2015-07-23 MED ORDER — MEDROXYPROGESTERONE ACETATE 150 MG/ML IM SUSP
150.0000 mg | Freq: Once | INTRAMUSCULAR | Status: AC
  Administered 2015-07-23: 150 mg via INTRAMUSCULAR

## 2015-07-23 NOTE — Progress Notes (Signed)
Patient ID: Samantha Rosales, female   DOB: [birth year], 34 y.o.   MRN: [id number] Depo Provera 150 mg IM given left deltoid with no complication, negative pregnancy test. Pt to return in 12 weeks for next injection. Pt verbalize no complaints at this time.

## 2015-07-26 LAB — POCT URINE PREGNANCY: Preg Test, Ur: NEGATIVE

## 2015-07-30 ENCOUNTER — Other Ambulatory Visit: Payer: Self-pay | Admitting: Family Medicine

## 2015-07-30 NOTE — Telephone Encounter (Signed)
Medication refilled per protocol. 

## 2015-10-15 ENCOUNTER — Encounter: Payer: Self-pay | Admitting: *Deleted

## 2015-10-15 ENCOUNTER — Ambulatory Visit (INDEPENDENT_AMBULATORY_CARE_PROVIDER_SITE_OTHER): Payer: BLUE CROSS/BLUE SHIELD | Admitting: *Deleted

## 2015-10-15 DIAGNOSIS — Z3042 Encounter for surveillance of injectable contraceptive: Secondary | ICD-10-CM | POA: Diagnosis not present

## 2015-10-15 DIAGNOSIS — Z3202 Encounter for pregnancy test, result negative: Secondary | ICD-10-CM | POA: Diagnosis not present

## 2015-10-15 DIAGNOSIS — Z3200 Encounter for pregnancy test, result unknown: Secondary | ICD-10-CM

## 2015-10-15 LAB — POCT URINE PREGNANCY: Preg Test, Ur: NEGATIVE

## 2015-10-15 MED ORDER — MEDROXYPROGESTERONE ACETATE 150 MG/ML IM SUSP
150.0000 mg | Freq: Once | INTRAMUSCULAR | Status: AC
  Administered 2015-10-15: 150 mg via INTRAMUSCULAR

## 2015-10-15 NOTE — Progress Notes (Signed)
Patient ID: Samantha Rosales, female   DOB: 1981-11-23, 34 y.o.   MRN: [id number] Pt here today for DEPO injection. Pt given DEPO in right deltoid and tolerated well.

## 2015-10-22 ENCOUNTER — Other Ambulatory Visit: Payer: Self-pay | Admitting: Family Medicine

## 2015-10-22 NOTE — Telephone Encounter (Signed)
Medication refilled per protocol. 

## 2015-12-01 ENCOUNTER — Ambulatory Visit (INDEPENDENT_AMBULATORY_CARE_PROVIDER_SITE_OTHER): Payer: BLUE CROSS/BLUE SHIELD | Admitting: Advanced Practice Midwife

## 2015-12-01 ENCOUNTER — Other Ambulatory Visit (HOSPITAL_COMMUNITY)
Admission: RE | Admit: 2015-12-01 | Discharge: 2015-12-01 | Disposition: A | Discharge status: Home / Self Care | Payer: Self-pay | Source: Ambulatory Visit | Attending: Advanced Practice Midwife | Admitting: Advanced Practice Midwife

## 2015-12-01 VITALS — BP 140/90 | HR 80 | Ht 68.0 in | Wt 170.0 lb

## 2015-12-01 DIAGNOSIS — Z01411 Encounter for gynecological examination (general) (routine) with abnormal findings: Secondary | ICD-10-CM

## 2015-12-01 DIAGNOSIS — B977 Papillomavirus as the cause of diseases classified elsewhere: Secondary | ICD-10-CM | POA: Diagnosis not present

## 2015-12-01 DIAGNOSIS — Z01419 Encounter for gynecological examination (general) (routine) without abnormal findings: Secondary | ICD-10-CM

## 2015-12-01 DIAGNOSIS — Z1151 Encounter for screening for human papillomavirus (HPV): Secondary | ICD-10-CM | POA: Insufficient documentation

## 2015-12-01 NOTE — Progress Notes (Signed)
Samantha Rosales 34 y.o.  Filed Vitals:   12/01/15 1034  BP: 140/90  Pulse: 80     Past Medical History: Past Medical History  Diagnosis Date  . GERD (gastroesophageal reflux disease)   . Seizures (HCC)     dx as child with epilepsy but no seizures since 89 and on no meds  . Hypertension   . BV (bacterial vaginosis)   . HSV-2 (herpes simplex virus 2) infection   . Abnormal Pap smear   . Rheumatoid arthritis (HCC)   . Allergic rhinitis   . Vaginal Pap smear, abnormal     Past Surgical History: Past Surgical History  Procedure Laterality Date  . Cervical conization w/bx  12/20/2012    Procedure: CONIZATION CERVIX WITH BIOPSY;  Surgeon: Lazaro Arms, MD;  Location: [facility];  Service: Gynecology;  Laterality: N/A;  . Holmium laser application  12/20/2012    Procedure: HOLMIUM LASER APPLICATION;  Surgeon: Lazaro Arms, MD;  Location: [facility];  Service: Gynecology;  Laterality: N/A;    Family History: Family History  Problem Relation Age of Onset  . Hypertension Mother   . Hypertension Father   . Cancer Paternal Uncle     pancreatic cancer    Social History: Social History  Substance Use Topics  . Smoking status: Current Every Day Smoker -- 0.50 packs/day for 15 years    Types: Cigarettes  . Smokeless tobacco: Never Used  . Alcohol Use: Yes     Comment: social    Allergies:  Allergies  Allergen Reactions  . Penicillins       Current outpatient prescriptions:  .  Biotin 1000 MCG tablet, Take 1,000 mcg by mouth daily., Disp: , Rfl:  .  Cholecalciferol (VITAMIN D-3) 1000 UNITS CAPS, Take 1 capsule by mouth daily., Disp: , Rfl:  .  CINNAMON PO, Take by mouth daily., Disp: , Rfl:  .  etanercept (ENBREL) 50 MG/ML injection, Inject 50 mg into the skin once a week., Disp: , Rfl:  .  fluticasone (FLONASE) 50 MCG/ACT nasal spray, USE TWO SPRAY(S) IN EACH NOSTRIL ONCE DAILY, Disp: 16 g, Rfl: 5 .  folic acid (FOLVITE) 1 MG tablet, Take 1 mg by mouth daily., Disp: , Rfl:   .  GARLIC PO, Take by mouth daily., Disp: , Rfl:  .  hydrochlorothiazide (MICROZIDE) 12.5 MG capsule, TAKE ONE CAPSULE BY MOUTH ONCE DAILY, Disp: 90 capsule, Rfl: 1 .  hydroxychloroquine (PLAQUENIL) 200 MG tablet, Take 200 mg by mouth 2 (two) times daily., Disp: , Rfl:  .  medroxyPROGESTERone (DEPO-PROVERA) 150 MG/ML injection, INJECT 1 ML INTRAMUSCULARLY IN DOCTOR'S OFFICE EVERY 3 MONTHS., Disp: 1 mL, Rfl: 3 .  methotrexate (RHEUMATREX) 2.5 MG tablet, Take 5 tablets by mouth weekly  Caution:Chemotherapy. Protect from light. On Monday, Disp: , Rfl:  .  Omega-3 Fatty Acids (FISH OIL) 1000 MG CAPS, Take 1,000 mg by mouth daily., Disp: , Rfl:  .  omeprazole (PRILOSEC) 20 MG capsule, Take 1 capsule (20 mg total) by mouth daily., Disp: 30 capsule, Rfl: 11  History of Present Illness: here for pap/physical  PCP watches HTN/labs.  Had conization 2014, no pap 2015.  Will collect pap/HPV today--would like 3 years' of normals before going to q 3 years.  Pt agrees. C/O lower sex drive, wonders if it's the depo. States relationship is good and stable.   Review of Systems   Patient denies any headaches, blurred vision, shortness of breath, chest pain, abdominal pain, problems with bowel  movements, urination, or intercourse.   Physical Exam: General:  Well developed, well nourished, no acute distress Skin:  Warm and dry Neck:  Midline trachea, normal thyroid Lungs; Clear to auscultation bilaterally Breast:  No dominant palpable mass, retraction, or nipple discharge Cardiovascular: Regular rate and rhythm Abdomen:  Soft, non tender, no hepatosplenomegaly Pelvic:  External genitalia is normal in appearance.  The vagina is normal in appearance.  The cervix is bulbous.  Uterus is felt to be normal size, shape, and contour.  No adnexal masses or tenderness noted.  Extremities:  No swelling or varicosities noted Psych:  No mood changes.     Impression: Normal pap/physical     Plan: pap in 2017, then q  3 years if all normal  If wants to try another [facility] with hopes of increasing sex drive, recommend Paragard. Pt will consider.

## 2015-12-02 LAB — CYTOLOGY - PAP

## 2015-12-02 NOTE — Addendum Note (Signed)
Addended by: Federico Flake A on: 12/02/2015 08:40 AM   Modules accepted: Orders

## 2015-12-07 ENCOUNTER — Encounter: Payer: Self-pay | Admitting: Advanced Practice Midwife

## 2016-01-07 ENCOUNTER — Ambulatory Visit (INDEPENDENT_AMBULATORY_CARE_PROVIDER_SITE_OTHER): Payer: 59 | Admitting: *Deleted

## 2016-01-07 ENCOUNTER — Encounter: Payer: Self-pay | Admitting: *Deleted

## 2016-01-07 DIAGNOSIS — Z309 Encounter for contraceptive management, unspecified: Secondary | ICD-10-CM | POA: Diagnosis not present

## 2016-01-07 DIAGNOSIS — Z3042 Encounter for surveillance of injectable contraceptive: Secondary | ICD-10-CM

## 2016-01-07 DIAGNOSIS — Z3202 Encounter for pregnancy test, result negative: Secondary | ICD-10-CM

## 2016-01-07 LAB — POCT URINE PREGNANCY: Preg Test, Ur: NEGATIVE

## 2016-01-07 MED ORDER — MEDROXYPROGESTERONE ACETATE 150 MG/ML IM SUSP
150.0000 mg | Freq: Once | INTRAMUSCULAR | Status: AC
  Administered 2016-01-07: 150 mg via INTRAMUSCULAR

## 2016-01-07 NOTE — Progress Notes (Signed)
Pt here for Depo. Reports no problems at this time. Return in 12 weeks for next shot. JSY 

## 2016-02-16 ENCOUNTER — Ambulatory Visit (INDEPENDENT_AMBULATORY_CARE_PROVIDER_SITE_OTHER): Payer: 59 | Admitting: Physician Assistant

## 2016-02-16 ENCOUNTER — Encounter: Payer: Self-pay | Admitting: Family Medicine

## 2016-02-16 ENCOUNTER — Encounter: Payer: Self-pay | Admitting: Physician Assistant

## 2016-02-16 VITALS — BP 160/98 | HR 100 | Temp 98.4°F | Resp 18 | Wt 176.0 lb

## 2016-02-16 DIAGNOSIS — R6883 Chills (without fever): Secondary | ICD-10-CM | POA: Diagnosis not present

## 2016-02-16 DIAGNOSIS — B349 Viral infection, unspecified: Secondary | ICD-10-CM | POA: Diagnosis not present

## 2016-02-16 LAB — INFLUENZA A AND B AG, IMMUNOASSAY
Influenza A Antigen: NOT DETECTED
Influenza B Antigen: NOT DETECTED

## 2016-02-16 NOTE — Progress Notes (Signed)
Patient ID: EURIKA SANDY MRN: [id number], DOB: 07-Mar-1981, 35 y.o. Date of Encounter: 02/16/2016, 12:17 PM    Chief Complaint:  Chief Complaint  Patient presents with  . sick x 5 days    n/v, sinuses, bump behind left ear, chills     HPI: 35 y.o. year old female presents with above.   She states that this illness started on Saturday 02/12/16. Says that that day she "just felt bad." Says that the same symptoms of "just feeling bad" have continued since then. Says that she has occasionally vomited a couple of times but isn't sure if this is just secondary to drainage. Says that she has continued a bland diet and can keep down food at times so isn't sure if it's really a stomach virus versus just secondary to drainage or something. Says that she had some diarrhea yesterday but otherwise has had no other diarrhea. Says that she is having some runny nose. Says that she was having cough over the weekend that seemed deeper like it was in her chest (at that time/over the weekend) but now she just has more of a dry cough that seems to be more just secondary to drainage in her throat. She has had symptoms of chills at times but has not checked her temperature to know if she has had any fever. With her job she usually works Monday through Friday -- but has been out of work every day this week so far.     Home Meds:   Outpatient Prescriptions Prior to Visit  Medication Sig Dispense Refill  . Biotin 1000 MCG tablet Take 1,000 mcg by mouth daily.    . Cholecalciferol (VITAMIN D-3) 1000 UNITS CAPS Take 1 capsule by mouth daily.    Marland Kitchen CINNAMON PO Take by mouth daily.    Marland Kitchen etanercept (ENBREL) 50 MG/ML injection Inject 50 mg into the skin once a week.    . fluticasone (FLONASE) 50 MCG/ACT nasal spray USE TWO SPRAY(S) IN EACH NOSTRIL ONCE DAILY 16 g 5  . folic acid (FOLVITE) 1 MG tablet Take 1 mg by mouth daily.    Marland Kitchen GARLIC PO Take by mouth daily.    . hydrochlorothiazide (MICROZIDE) 12.5 MG  capsule TAKE ONE CAPSULE BY MOUTH ONCE DAILY 90 capsule 1  . hydroxychloroquine (PLAQUENIL) 200 MG tablet Take 200 mg by mouth 2 (two) times daily.    . medroxyPROGESTERone (DEPO-PROVERA) 150 MG/ML injection INJECT 1 ML INTRAMUSCULARLY IN DOCTOR'S OFFICE EVERY 3 MONTHS. 1 mL 3  . methotrexate (RHEUMATREX) 2.5 MG tablet Take 5 tablets by mouth weekly  Caution:Chemotherapy. Protect from light. On Monday    . Omega-3 Fatty Acids (FISH OIL) 1000 MG CAPS Take 1,000 mg by mouth daily.    Marland Kitchen omeprazole (PRILOSEC) 20 MG capsule Take 1 capsule (20 mg total) by mouth daily. 30 capsule 11  . predniSONE (DELTASONE) 10 MG tablet Take 3  tablets once daily for 3 days, 2 tablets daily for 3 days, 1 tablet daily for 3 days and then 1/2 (half) tablet daily for 3 days and then STOP.     No facility-administered medications prior to visit.    Allergies:  Allergies  Allergen Reactions  . Penicillins       Review of Systems: See HPI for pertinent ROS. All other ROS negative.    Physical Exam: Blood pressure 160/98, pulse 100, temperature 98.4 F (36.9 C), temperature source Oral, resp. rate 18, weight 176 lb (79.833 kg)., Body mass index is  26.77 kg/(m^2). General:  WNWD AAF. Appears in no acute distress. HEENT: Normocephalic, atraumatic, eyes without discharge, sclera non-icteric, nares are without discharge. Bilateral auditory canals clear, TM's are without perforation, pearly grey and translucent with reflective cone of light bilaterally. Oral cavity moist, posterior pharynx without exudate, erythema, peritonsillar abscess.  Neck: Supple. No thyromegaly. No lymphadenopathy. Lungs: Clear bilaterally to auscultation without wheezes, rales, or rhonchi. Breathing is unlabored. Heart: Regular rhythm. No murmurs, rubs, or gallops. Abdomen: Soft, non-tender, non-distended with normoactive bowel sounds. No hepatomegaly. No rebound/guarding. No obvious abdominal masses. No area of tenderness with palpation.    Msk:  Strength and tone normal for age. Extremities/Skin: Warm and dry.  Neuro: Alert and oriented X 3. Moves all extremities spontaneously. Gait is normal. CNII-XII grossly in tact. Psych:  Responds to questions appropriately with a normal affect.   Results for orders placed or performed in visit on 02/16/16  Influenza A and B Ag, Immunoassay  Result Value Ref Range   Source: NASAL    Influenza A Antigen Not Detected Not Detected   Influenza B Antigen Not Detected Not Detected     ASSESSMENT AND PLAN:  35 y.o. year old female with  1. Viral illness Discussed with patient that I suspect that this is a viral illness. Should run its course and resolve on its own. Can use medications for symptom relief in the interim. Note given for her to be out of work this week which includes Monday, March 13 through Friday, March 17. Told her to call us and follow-up if any symptoms increase/worsen or she develops fever.  2. Chills without fever - Influenza A and B Ag, Immunoassay  Discussed with patient that her blood pressure is reading high. I rechecked it myself and got 160/94. I reviewed her office visits in epic to see what her blood pressure has read at recent visits. She had visit with GYN 12/01/15 at which time blood pressure reading was 140/90. I asked her who she sees for rheumatology and how her blood pressure has been at those visits. Says that it is sometimes high but that she feels like it is white coat hypertension. Discussed her getting a blood pressure monitor so that she can check blood pressures at home and she states that she definitely can and will do this. She is to buy a blood pressure cuff and check her blood pressure regularly and follow-up with [place] if she is getting greater than 140/90.  [address] [place], [place], [facility] 02/16/2016 12:17 PM

## 2016-03-06 LAB — HM DIABETES EYE EXAM

## 2016-03-07 ENCOUNTER — Encounter: Payer: Self-pay | Admitting: Family Medicine

## 2016-03-09 ENCOUNTER — Other Ambulatory Visit: Payer: Self-pay | Admitting: Adult Health

## 2016-03-31 ENCOUNTER — Ambulatory Visit (INDEPENDENT_AMBULATORY_CARE_PROVIDER_SITE_OTHER): Payer: 59 | Admitting: *Deleted

## 2016-03-31 ENCOUNTER — Encounter: Payer: Self-pay | Admitting: *Deleted

## 2016-03-31 DIAGNOSIS — Z3042 Encounter for surveillance of injectable contraceptive: Secondary | ICD-10-CM | POA: Diagnosis not present

## 2016-03-31 DIAGNOSIS — Z3202 Encounter for pregnancy test, result negative: Secondary | ICD-10-CM | POA: Diagnosis not present

## 2016-03-31 LAB — POCT URINE PREGNANCY: Preg Test, Ur: NEGATIVE

## 2016-03-31 MED ORDER — HYDROXYPROGESTERONE CAPROATE 250 MG/ML IM OIL: 250.0000 mg | TOPICAL_OIL | Freq: Once | INTRAMUSCULAR | Status: DC

## 2016-03-31 MED ORDER — MEDROXYPROGESTERONE ACETATE 150 MG/ML IM SUSP
150.0000 mg | Freq: Once | INTRAMUSCULAR | Status: AC
  Administered 2016-03-31: 150 mg via INTRAMUSCULAR

## 2016-03-31 NOTE — Progress Notes (Signed)
Patient ID: Samantha Rosales, female   DOB: 1981-07-25, 35 y.o.   MRN: [id number] Depo Provera 150 mg IM given right deltoid with no complications, Negative pregnancy test. Pt to return in 12 weeks for next injection.

## 2016-04-06 ENCOUNTER — Ambulatory Visit: Payer: 59 | Admitting: Physician Assistant

## 2016-04-17 ENCOUNTER — Ambulatory Visit (INDEPENDENT_AMBULATORY_CARE_PROVIDER_SITE_OTHER): Payer: 59 | Admitting: Obstetrics & Gynecology

## 2016-04-17 ENCOUNTER — Encounter: Payer: 59 | Admitting: Obstetrics & Gynecology

## 2016-04-17 ENCOUNTER — Encounter: Payer: Self-pay | Admitting: Obstetrics & Gynecology

## 2016-04-17 VITALS — BP 130/70 | HR 76 | Ht 68.0 in | Wt 176.0 lb

## 2016-04-17 DIAGNOSIS — R87612 Low grade squamous intraepithelial lesion on cytologic smear of cervix (LGSIL): Secondary | ICD-10-CM | POA: Diagnosis not present

## 2016-04-17 DIAGNOSIS — Z0142 Encounter for cervical smear to confirm findings of recent normal smear following initial abnormal smear: Secondary | ICD-10-CM

## 2016-04-17 DIAGNOSIS — IMO0002 Reserved for concepts with insufficient information to code with codable children: Secondary | ICD-10-CM

## 2016-04-17 DIAGNOSIS — B977 Papillomavirus as the cause of diseases classified elsewhere: Secondary | ICD-10-CM

## 2016-04-17 NOTE — Progress Notes (Signed)
Patient ID: Samantha Rosales, female   DOB: 03/28/1981, 34 y.o.   MRN: [id number] Colposcopy Procedure Note:  Colposcopy Procedure Note  Indications: Pap smear 5 months ago showed: low-grade squamous intraepithelial neoplasia (LGSIL - encompassing HPV,mild dysplasia,CIN I). The prior pap showed no abnormalities.  Prior cervical/vaginal disease:HSIL . Prior cervical treatment: .Conization of cervix, laser 12/20/2012  Smoker:  Yes.   New sexual partner:  No.  : time frame:    History of abnormal Pap: yes  Procedure Details  The risks and benefits of the procedure and Written informed consent obtained.  Speculum placed in vagina and excellent visualization of cervix achieved, cervix swabbed x 3 with acetic acid solution.  Findings: Cervix: no visible lesions, no mosaicism, no punctation and no abnormal vasculature; no biopsies taken. Vaginal inspection: normal without visible lesions. Vulvar colposcopy: vulvar colposcopy not performed.  Specimens: none  Complications: none.  Plan: Follow up Pap in 11/2016

## 2016-04-18 ENCOUNTER — Telehealth: Payer: Self-pay | Admitting: Family Medicine

## 2016-04-18 NOTE — Telephone Encounter (Signed)
Pt saw her Rheumatologist yesterday and they suggest that she have her HCTZ dose increased due to her high BP readings when she's in the office.  Please advise  [phone]

## 2016-04-19 NOTE — Telephone Encounter (Signed)
Patient needs OV.

## 2016-05-09 ENCOUNTER — Telehealth: Payer: Self-pay | Admitting: Family Medicine

## 2016-05-09 NOTE — Telephone Encounter (Signed)
Patient calling to say that her rheumatologist told her to ask dr pickard if her hydrochlorothiazide could be increased because her bp is high  [phone] or [phone]

## 2016-05-10 NOTE — Telephone Encounter (Signed)
Please schedule appt

## 2016-05-11 NOTE — Telephone Encounter (Signed)
Left message for patient to call office and schedule appointment about hctz change.

## 2016-05-22 ENCOUNTER — Other Ambulatory Visit: Payer: Self-pay | Admitting: Family Medicine

## 2016-05-23 NOTE — Telephone Encounter (Signed)
Refill appropriate and filled per protocol. 

## 2016-06-18 ENCOUNTER — Other Ambulatory Visit: Payer: Self-pay | Admitting: Family Medicine

## 2016-06-19 NOTE — Telephone Encounter (Signed)
Refill appropriate and filled per protocol. 

## 2016-06-23 ENCOUNTER — Encounter: Payer: Self-pay | Admitting: *Deleted

## 2016-06-23 ENCOUNTER — Ambulatory Visit (INDEPENDENT_AMBULATORY_CARE_PROVIDER_SITE_OTHER): Payer: 59 | Admitting: Family Medicine

## 2016-06-23 ENCOUNTER — Encounter: Payer: Self-pay | Admitting: Family Medicine

## 2016-06-23 ENCOUNTER — Ambulatory Visit (INDEPENDENT_AMBULATORY_CARE_PROVIDER_SITE_OTHER): Payer: 59 | Admitting: *Deleted

## 2016-06-23 VITALS — BP 140/92 | HR 74 | Temp 98.6°F | Resp 16 | Ht 68.0 in | Wt 183.0 lb

## 2016-06-23 DIAGNOSIS — Z3042 Encounter for surveillance of injectable contraceptive: Secondary | ICD-10-CM

## 2016-06-23 DIAGNOSIS — Z3202 Encounter for pregnancy test, result negative: Secondary | ICD-10-CM | POA: Diagnosis not present

## 2016-06-23 DIAGNOSIS — I10 Essential (primary) hypertension: Secondary | ICD-10-CM

## 2016-06-23 LAB — POCT URINE PREGNANCY: Preg Test, Ur: NEGATIVE

## 2016-06-23 MED ORDER — MEDROXYPROGESTERONE ACETATE 150 MG/ML IM SUSP
150.0000 mg | Freq: Once | INTRAMUSCULAR | Status: AC
  Administered 2016-06-23: 150 mg via INTRAMUSCULAR

## 2016-06-23 MED ORDER — LOSARTAN POTASSIUM-HCTZ 50-12.5 MG PO TABS: 1.0000 | ORAL_TABLET | Freq: Every day | ORAL | Status: DC

## 2016-06-23 NOTE — Progress Notes (Signed)
Subjective:    Patient ID: Samantha Rosales, female    DOB: 1981/06/17, 35 y.o.   MRN: [id number]  HPI Blood pressure has been elevated recently between 140 and 150 systolic over 90 diastolic. She is currently taking hydrochlorothiazide 12.5 mg daily. She denies any chest pain shortness of breath or dyspnea on exertion. She denies any complications from hydrochlorothiazide.  Past Medical History  Diagnosis Date  . GERD (gastroesophageal reflux disease)   . Seizures (HCC)     dx as child with epilepsy but no seizures since 89 and on no meds  . Hypertension   . BV (bacterial vaginosis)   . HSV-2 (herpes simplex virus 2) infection   . Abnormal Pap smear   . Rheumatoid arthritis (HCC)   . Allergic rhinitis   . Vaginal Pap smear, abnormal    Past Surgical History  Procedure Laterality Date  . Cervical conization w/bx  12/20/2012    Procedure: CONIZATION CERVIX WITH BIOPSY;  Surgeon: Lazaro Arms, MD;  Location: [facility];  Service: Gynecology;  Laterality: N/A;  . Holmium laser application  12/20/2012    Procedure: HOLMIUM LASER APPLICATION;  Surgeon: Lazaro Arms, MD;  Location: [facility];  Service: Gynecology;  Laterality: N/A;   Current Outpatient Prescriptions on File Prior to Visit  Medication Sig Dispense Refill  . Biotin 1000 MCG tablet Take 1,000 mcg by mouth daily.    . Cholecalciferol (VITAMIN D-3) 1000 UNITS CAPS Take 1 capsule by mouth daily.    Marland Kitchen CINNAMON PO Take by mouth daily.    Marland Kitchen etanercept (ENBREL) 50 MG/ML injection Inject 50 mg into the skin once a week.    . fluticasone (FLONASE) 50 MCG/ACT nasal spray USE TWO SPRAY(S) IN EACH NOSTRIL ONCE DAILY 16 g 11  . folic acid (FOLVITE) 1 MG tablet Take 1 mg by mouth daily.    Marland Kitchen GARLIC PO Take by mouth daily.    . hydrochlorothiazide (MICROZIDE) 12.5 MG capsule TAKE ONE CAPSULE BY MOUTH ONCE DAILY 90 capsule 0  . hydroxychloroquine (PLAQUENIL) 200 MG tablet Take 200 mg by mouth 2 (two) times daily.    . medroxyPROGESTERone  (DEPO-PROVERA) 150 MG/ML injection INJECT 1 ML INTRAMUSCULARLY IN DOCTOR'S OFFICE EVERY 3 MONTHS. 1 mL 3  . methotrexate (RHEUMATREX) 2.5 MG tablet Take 5 tablets by mouth weekly  Caution:Chemotherapy. Protect from light. On Monday    . Omega-3 Fatty Acids (FISH OIL) 1000 MG CAPS Take 1,000 mg by mouth daily.    Marland Kitchen omeprazole (PRILOSEC) 20 MG capsule Take 1 capsule (20 mg total) by mouth daily. 30 capsule 11   No current facility-administered medications on file prior to visit.   Allergies  Allergen Reactions  . Penicillins    Social History   Social History  . Marital Status: Single    Spouse Name: N/A  . Number of Children: 2  . Years of Education: N/A   Occupational History  . [employer]    Social History Main Topics  . Smoking status: Current Every Day Smoker -- 0.50 packs/day for 15 years    Types: Cigarettes  . Smokeless tobacco: Never Used  . Alcohol Use: Yes     Comment: social  . Drug Use: No  . Sexual Activity: Yes    Birth Control/ Protection: Injection     Comment: Depo Provera every 3 months   Other Topics Concern  . Not on file   Social History Narrative    Review of Systems  All  other systems reviewed and are negative.      Objective:   Physical Exam  Cardiovascular: Normal rate, regular rhythm and normal heart sounds.   Pulmonary/Chest: Effort normal and breath sounds normal. No respiratory distress. She has no wheezes. She has no rales.  Abdominal: Soft. Bowel sounds are normal. She exhibits no distension. There is no tenderness. There is no rebound and no guarding.  Musculoskeletal: She exhibits no edema.  Vitals reviewed.         Assessment & Plan:  Benign essential HTN - Plan: losartan-hydrochlorothiazide (HYZAAR) 50-12.5 MG tablet  Lab work is being monitored at the rheumatologist per the patient. Discontinue hydrochlorothiazide and switch to Hyzaar 50/12.5 one by mouth daily. Recheck blood pressure in one month.

## 2016-06-23 NOTE — Progress Notes (Signed)
Patient ID: Samantha Rosales, female   DOB: [birth year], 35 y.o.   MRN: [id number] Depo Provera 150 mg IM given left deltoid with no complications, negative pregnancy test. Pt to return in 12 weeks for next injection.

## 2016-07-28 ENCOUNTER — Other Ambulatory Visit: Payer: Self-pay | Admitting: Family Medicine

## 2016-07-28 NOTE — Telephone Encounter (Signed)
Refill appropriate and filled per protocol. 

## 2016-08-09 ENCOUNTER — Ambulatory Visit: Payer: 59 | Admitting: Women's Health

## 2016-09-15 ENCOUNTER — Encounter: Payer: Self-pay | Admitting: *Deleted

## 2016-09-15 ENCOUNTER — Ambulatory Visit (INDEPENDENT_AMBULATORY_CARE_PROVIDER_SITE_OTHER): Payer: 59 | Admitting: *Deleted

## 2016-09-15 DIAGNOSIS — Z3202 Encounter for pregnancy test, result negative: Secondary | ICD-10-CM

## 2016-09-15 DIAGNOSIS — Z3042 Encounter for surveillance of injectable contraceptive: Secondary | ICD-10-CM

## 2016-09-15 DIAGNOSIS — Z308 Encounter for other contraceptive management: Secondary | ICD-10-CM

## 2016-09-15 LAB — POCT URINE PREGNANCY: Preg Test, Ur: NEGATIVE

## 2016-09-15 MED ORDER — MEDROXYPROGESTERONE ACETATE 150 MG/ML IM SUSP
150.0000 mg | Freq: Once | INTRAMUSCULAR | Status: AC
  Administered 2016-09-15: 150 mg via INTRAMUSCULAR

## 2016-09-15 NOTE — Progress Notes (Signed)
Pt here for Depo. Pt tolerated shot well. Return in 12 weeks for next shot. JSY 

## 2016-09-22 ENCOUNTER — Ambulatory Visit (INDEPENDENT_AMBULATORY_CARE_PROVIDER_SITE_OTHER): Payer: 59 | Admitting: Family Medicine

## 2016-09-22 ENCOUNTER — Encounter: Payer: Self-pay | Admitting: Family Medicine

## 2016-09-22 VITALS — BP 138/90 | HR 98 | Temp 98.0°F | Resp 16 | Ht 68.0 in | Wt 174.0 lb

## 2016-09-22 DIAGNOSIS — Z23 Encounter for immunization: Secondary | ICD-10-CM | POA: Diagnosis not present

## 2016-09-22 DIAGNOSIS — L439 Lichen planus, unspecified: Secondary | ICD-10-CM

## 2016-09-22 DIAGNOSIS — Z0000 Encounter for general adult medical examination without abnormal findings: Secondary | ICD-10-CM | POA: Diagnosis not present

## 2016-09-22 MED ORDER — MOMETASONE FUROATE 0.1 % EX CREA: 1.0000 "application " | TOPICAL_CREAM | Freq: Every day | CUTANEOUS | 0 refills | Status: DC

## 2016-09-22 NOTE — Progress Notes (Signed)
Subjective:    Patient ID: Samantha Rosales, female    DOB: 05/09/1981, 35 y.o.   MRN: [id number]  HPI Patient is here today for complete physical exam. She is a very pleasant 35 year old African-American female. She gets her breast exam and her Pap smear from an OB/GYN. She is due for a flu shot. Her blood pressure is better since we switched her medication. Unfortunately she is developing a rash on the ventral surface of her right wrist consistent with lichen planus. It is several flattop purplish papules in a cluster. She also has a similar rash on the dorsum of her right foot and on the dorsum of her right forearm. She would like to try steroid creams prior to switching medication. She is on an orbit, thiazide diuretic, and hydroxychloroquine all which can possibly cause this. She also reports insomnia. However she has not tried any medication for insomnia Past Medical History:  Diagnosis Date  . Abnormal Pap smear   . Allergic rhinitis   . BV (bacterial vaginosis)   . GERD (gastroesophageal reflux disease)   . HSV-2 (herpes simplex virus 2) infection   . Hypertension   . Rheumatoid arthritis (HCC)   . Seizures (HCC)    dx as child with epilepsy but no seizures since 89 and on no meds  . Vaginal Pap smear, abnormal    Past Surgical History:  Procedure Laterality Date  . CERVICAL CONIZATION W/BX  12/20/2012   Procedure: CONIZATION CERVIX WITH BIOPSY;  Surgeon: Lazaro Arms, MD;  Location: [facility];  Service: Gynecology;  Laterality: N/A;  . HOLMIUM LASER APPLICATION  12/20/2012   Procedure: HOLMIUM LASER APPLICATION;  Surgeon: Lazaro Arms, MD;  Location: [facility];  Service: Gynecology;  Laterality: N/A;   Current Outpatient Prescriptions on File Prior to Visit  Medication Sig Dispense Refill  . Biotin 1000 MCG tablet Take 1,000 mcg by mouth daily.    . Cholecalciferol (VITAMIN D-3) 1000 UNITS CAPS Take 1 capsule by mouth daily.    Marland Kitchen etanercept (ENBREL) 50 MG/ML injection Inject 50 mg  into the skin once a week.    . fluticasone (FLONASE) 50 MCG/ACT nasal spray USE TWO SPRAY(S) IN EACH NOSTRIL ONCE DAILY 16 g 11  . folic acid (FOLVITE) 1 MG tablet Take 1 mg by mouth daily.    Marland Kitchen GARLIC PO Take by mouth daily.    . hydroxychloroquine (PLAQUENIL) 200 MG tablet Take 200 mg by mouth 2 (two) times daily.    Marland Kitchen losartan-hydrochlorothiazide (HYZAAR) 50-12.5 MG tablet Take 1 tablet by mouth daily. 90 tablet 3  . medroxyPROGESTERone (DEPO-PROVERA) 150 MG/ML injection INJECT 1 ML INTRAMUSCULARLY IN DOCTOR'S OFFICE EVERY 3 MONTHS. 1 mL 3  . methotrexate (RHEUMATREX) 2.5 MG tablet Take 6 tablets by mouth weekly  Caution:Chemotherapy. Protect from light. On Monday    . Omega-3 Fatty Acids (FISH OIL) 1000 MG CAPS Take 1,000 mg by mouth daily.    Marland Kitchen omeprazole (PRILOSEC) 20 MG capsule TAKE ONE CAPSULE BY MOUTH ONCE DAILY 30 capsule 11   No current facility-administered medications on file prior to visit.    Allergies  Allergen Reactions  . Penicillins    Social History   Social History  . Marital status: Single    Spouse name: N/A  . Number of children: 2  . Years of education: N/A   Occupational History  . [employer]    Social History Main Topics  . Smoking status: Current Every Day Smoker  Packs/day: 0.50    Years: 15.00    Types: Cigarettes  . Smokeless tobacco: Never Used  . Alcohol use Yes     Comment: social  . Drug use: No  . Sexual activity: Yes    Birth control/ protection: Injection     Comment: Depo Provera every 3 months   Other Topics Concern  . Not on file   Social History Narrative  . No narrative on file   Family History  Problem Relation Age of Onset  . Hypertension Mother   . Hypertension Father   . Cancer Paternal Uncle     pancreatic cancer      Review of Systems  All other systems reviewed and are negative.      Objective:   Physical Exam  Constitutional: She is oriented to person, place,  and time. She appears well-developed and well-nourished. No distress.  HENT:  Head: Normocephalic and atraumatic.  Right Ear: External ear normal.  Left Ear: External ear normal.  Nose: Nose normal.  Mouth/Throat: Oropharynx is clear and moist. No oropharyngeal exudate.  Eyes: Conjunctivae and EOM are normal. Pupils are equal, round, and reactive to light. Right eye exhibits no discharge. Left eye exhibits no discharge. No scleral icterus.  Neck: Normal range of motion. Neck supple. No JVD present. No tracheal deviation present. No thyromegaly present.  Cardiovascular: Normal rate, regular rhythm, normal heart sounds and intact distal pulses.  Exam reveals no gallop and no friction rub.   No murmur heard. Pulmonary/Chest: Effort normal and breath sounds normal. No stridor. No respiratory distress. She has no wheezes. She has no rales. She exhibits no tenderness.  Abdominal: Soft. Bowel sounds are normal. She exhibits no distension and no mass. There is no tenderness. There is no rebound and no guarding.  Musculoskeletal: Normal range of motion. She exhibits no edema or tenderness.  Lymphadenopathy:    She has no cervical adenopathy.  Neurological: She is alert and oriented to person, place, and time. She has normal reflexes. No cranial nerve deficit. She exhibits normal muscle tone. Coordination normal.  Skin: Skin is warm. No rash noted. She is not diaphoretic. No erythema. No pallor.  Psychiatric: She has a normal mood and affect. Her behavior is normal. Judgment and thought content normal.  Vitals reviewed.         Assessment & Plan:  Lichen planus - Plan: mometasone (ELOCON) 0.1 % cream  Routine general medical examination at a health care facility  Patient's physical exam is normal. I also asked the patient to return fasting so that I can check a lipid panel to monitor her cholesterol as she has a history of hypertriglyceridemia and low HDL cholesterol.  I will also check a  TSH,  cbc, cmp. Patient received her flu shot today. We will try Elocon cream for lichen planus. I recommended if the rash does not improve discussing changing medication to see if this will help apply complaints. Patient will try Benadryl to help her sleep

## 2016-09-22 NOTE — Addendum Note (Signed)
Addended by: Legrand Rams B on: 09/22/2016 03:48 PM   Modules accepted: Orders

## 2016-12-08 ENCOUNTER — Ambulatory Visit (INDEPENDENT_AMBULATORY_CARE_PROVIDER_SITE_OTHER): Payer: BLUE CROSS/BLUE SHIELD | Admitting: *Deleted

## 2016-12-08 DIAGNOSIS — Z3202 Encounter for pregnancy test, result negative: Secondary | ICD-10-CM

## 2016-12-08 DIAGNOSIS — Z3042 Encounter for surveillance of injectable contraceptive: Secondary | ICD-10-CM

## 2016-12-08 LAB — POCT URINE PREGNANCY: Preg Test, Ur: NEGATIVE

## 2016-12-08 MED ORDER — MEDROXYPROGESTERONE ACETATE 150 MG/ML IM SUSP
150.0000 mg | Freq: Once | INTRAMUSCULAR | Status: AC
  Administered 2016-12-08: 150 mg via INTRAMUSCULAR

## 2016-12-08 NOTE — Progress Notes (Signed)
Depo Provera 150mg IM given in right deltoid with no complications. Patient to return in 12 weeks for next injection.  

## 2016-12-27 ENCOUNTER — Encounter: Payer: Self-pay | Admitting: Physician Assistant

## 2016-12-27 ENCOUNTER — Ambulatory Visit (INDEPENDENT_AMBULATORY_CARE_PROVIDER_SITE_OTHER): Payer: BLUE CROSS/BLUE SHIELD | Admitting: Physician Assistant

## 2016-12-27 VITALS — BP 130/86 | HR 108 | Temp 97.6°F | Resp 16 | Wt 176.2 lb

## 2016-12-27 DIAGNOSIS — R319 Hematuria, unspecified: Secondary | ICD-10-CM | POA: Diagnosis not present

## 2016-12-27 DIAGNOSIS — N390 Urinary tract infection, site not specified: Secondary | ICD-10-CM | POA: Diagnosis not present

## 2016-12-27 DIAGNOSIS — R350 Frequency of micturition: Secondary | ICD-10-CM | POA: Diagnosis not present

## 2016-12-27 LAB — URINALYSIS, MICROSCOPIC ONLY
Casts: NONE SEEN [LPF]
Crystals: NONE SEEN [HPF]
Yeast: NONE SEEN [HPF]

## 2016-12-27 LAB — URINALYSIS, ROUTINE W REFLEX MICROSCOPIC
Bilirubin Urine: NEGATIVE
Ketones, ur: NEGATIVE
Nitrite: POSITIVE — AB
Specific Gravity, Urine: 1.02 (ref 1.001–1.035)
pH: 6 (ref 5.0–8.0)

## 2016-12-27 MED ORDER — NITROFURANTOIN MONOHYD MACRO 100 MG PO CAPS: 100.0000 mg | ORAL_CAPSULE | Freq: Two times a day (BID) | ORAL | 0 refills | Status: DC

## 2016-12-27 NOTE — Progress Notes (Signed)
Patient ID: Samantha Rosales MRN: [id number], DOB: 1981/10/25, 36 y.o. Date of Encounter: 12/27/2016, 4:46 PM    Chief Complaint:  Chief Complaint  Patient presents with  . Urinary Frequency    x1 day, taking OTC AZO  . Back Pain     HPI: 36 y.o. year old female presents with above.   Says symptoms started yesterday but then progressed / worsened through the day. Started some AZO last night. Feeling some discomfort around her right lower back and some pressure in her suprapubic region. When she finishes urinating, feels like she still has to go. Having urinary frequency also. Really not feeling much burning with urination/dysuria. No fevers. Says that she has had UTIs in the past with same type of symptoms.     Home Meds:   Outpatient Medications Prior to Visit  Medication Sig Dispense Refill  . Adalimumab (HUMIRA [place]) Inject into the skin.    . Biotin 1000 MCG tablet Take 1,000 mcg by mouth daily.    . Cholecalciferol (VITAMIN D-3) 1000 UNITS CAPS Take 1 capsule by mouth daily.    . fluticasone (FLONASE) 50 MCG/ACT nasal spray USE TWO SPRAY(S) IN EACH NOSTRIL ONCE DAILY 16 g 11  . folic acid (FOLVITE) 1 MG tablet Take 1 mg by mouth daily.    Marland Kitchen GARLIC PO Take by mouth daily.    . hydroxychloroquine (PLAQUENIL) 200 MG tablet Take 200 mg by mouth 2 (two) times daily.    Marland Kitchen losartan-hydrochlorothiazide (HYZAAR) 50-12.5 MG tablet Take 1 tablet by mouth daily. 90 tablet 3  . medroxyPROGESTERone (DEPO-PROVERA) 150 MG/ML injection INJECT 1 ML INTRAMUSCULARLY IN DOCTOR'S OFFICE EVERY 3 MONTHS. 1 mL 3  . methotrexate (RHEUMATREX) 2.5 MG tablet Take 6 tablets by mouth weekly  Caution:Chemotherapy. Protect from light. On Monday    . mometasone (ELOCON) 0.1 % cream Apply 1 application topically daily. 45 g 0  . Omega-3 Fatty Acids (FISH OIL) 1000 MG CAPS Take 1,000 mg by mouth daily.    Marland Kitchen omeprazole (PRILOSEC) 20 MG capsule TAKE ONE CAPSULE BY MOUTH ONCE DAILY 30 capsule 11  . etanercept  (ENBREL) 50 MG/ML injection Inject 50 mg into the skin once a week.     No facility-administered medications prior to visit.     Allergies:  Allergies  Allergen Reactions  . Penicillins       Review of Systems: See HPI for pertinent ROS. All other ROS negative.    Physical Exam: Blood pressure 130/86,  88 pulse , temperature 97.6 F (36.4 C), temperature source Oral, resp. rate 16, weight 176 lb 3.2 oz (79.9 kg), SpO2 97 %., Body mass index is 26.79 kg/m. General:  Overweight AAF.  Appears in no acute distress. Neck: Supple. No thyromegaly. No lymphadenopathy. Lungs: Clear bilaterally to auscultation without wheezes, rales, or rhonchi. Breathing is unlabored. Heart: Regular rhythm. No murmurs, rubs, or gallops. Abdomen: Soft,  non-distended with normoactive bowel sounds. No hepatomegaly. No rebound/guarding. No obvious abdominal masses. Mild tenderness with palpation of the suprapubic region. No other areas of tenderness with palpation. Msk:  Strength and tone normal for age. No costophrenic angle tenderness with percussion bilaterally. Extremities/Skin: Warm and dry. No clubbing or cyanosis. No edema. No rashes or suspicious lesions. Neuro: Alert and oriented X 3. Moves all extremities spontaneously. Gait is normal. CNII-XII grossly in tact. Psych:  Responds to questions appropriately with a normal affect.   Results for orders placed or performed in visit on 12/27/16  Urinalysis, Routine w  reflex microscopic  Result Value Ref Range   Color, Urine ORANGE (A) YELLOW   APPearance CLEAR CLEAR   Specific Gravity, Urine 1.020 1.001 - 1.035   pH 6.0 5.0 - 8.0   Glucose, UA TRACE (A) NEGATIVE   Bilirubin Urine NEGATIVE NEGATIVE   Ketones, ur NEGATIVE NEGATIVE   Hgb urine dipstick TRACE (A) NEGATIVE   Protein, ur 2+ (A) NEGATIVE   Nitrite POSITIVE (A) NEGATIVE   Leukocytes, UA TRACE (A) NEGATIVE  Urine Microscopic  Result Value Ref Range   WBC, UA 6-10 (A) <=5 WBC/HPF   RBC /  HPF 0-2 <=2 RBC/HPF   Squamous Epithelial / LPF 0-5 <=5 HPF   Bacteria, UA MODERATE (A) NONE SEEN HPF   Crystals NONE SEEN NONE SEEN HPF   Casts NONE SEEN NONE SEEN LPF   Yeast NONE SEEN NONE SEEN HPF   Urine-Other MUCUS     ASSESSMENT AND PLAN:  36 y.o. year old female with  1. Urinary tract infection with hematuria, site unspecified  she is to go ahead and take first dose of Macrobid soon as possible. Take as directed and complete all of it. Follow-up if symptoms worsen or develops fever or if symptoms do not resolve upon completion of abx. As well, I will follow-up urine culture result. - nitrofurantoin, macrocrystal-monohydrate, (MACROBID) 100 MG capsule; Take 1 capsule (100 mg total) by mouth 2 (two) times daily.  Dispense: 6 capsule; Refill: 0 - Urine culture  2. Frequent urination  - Urinalysis, Routine w reflex microscopic - Urine culture   Signed, [address] [place], [place], [facility] 12/27/2016 4:46 PM

## 2016-12-30 LAB — URINE CULTURE

## 2017-01-01 ENCOUNTER — Telehealth: Payer: Self-pay

## 2017-01-01 NOTE — Telephone Encounter (Signed)
Called pt to change her antibiotic from Macrobid to Cipro. Rx sent to

## 2017-01-02 ENCOUNTER — Other Ambulatory Visit: Payer: Self-pay

## 2017-01-02 MED ORDER — CIPROFLOXACIN HCL 500 MG PO TABS: 500.0000 mg | ORAL_TABLET | Freq: Two times a day (BID) | ORAL | 0 refills | Status: DC

## 2017-03-01 ENCOUNTER — Other Ambulatory Visit: Payer: Self-pay | Admitting: Adult Health

## 2017-03-01 ENCOUNTER — Ambulatory Visit: Payer: BLUE CROSS/BLUE SHIELD

## 2017-03-02 ENCOUNTER — Ambulatory Visit: Payer: BLUE CROSS/BLUE SHIELD

## 2017-03-05 ENCOUNTER — Ambulatory Visit (INDEPENDENT_AMBULATORY_CARE_PROVIDER_SITE_OTHER): Payer: BLUE CROSS/BLUE SHIELD

## 2017-03-05 VITALS — Wt 178.8 lb

## 2017-03-05 DIAGNOSIS — Z3042 Encounter for surveillance of injectable contraceptive: Secondary | ICD-10-CM

## 2017-03-05 DIAGNOSIS — Z3202 Encounter for pregnancy test, result negative: Secondary | ICD-10-CM | POA: Diagnosis not present

## 2017-03-05 LAB — POCT URINE PREGNANCY: Preg Test, Ur: NEGATIVE

## 2017-03-05 MED ORDER — MEDROXYPROGESTERONE ACETATE 150 MG/ML IM SUSP
150.0000 mg | Freq: Once | INTRAMUSCULAR | Status: AC
  Administered 2017-03-05: 150 mg via INTRAMUSCULAR

## 2017-03-05 NOTE — Progress Notes (Signed)
PT here for Depo Shot 150 mg IM given RT Deltoid. Tolerated well. Return 12 week for next shot.P Bradley Handyside CMA

## 2017-06-01 ENCOUNTER — Ambulatory Visit (INDEPENDENT_AMBULATORY_CARE_PROVIDER_SITE_OTHER): Payer: BLUE CROSS/BLUE SHIELD

## 2017-06-01 DIAGNOSIS — Z3202 Encounter for pregnancy test, result negative: Secondary | ICD-10-CM

## 2017-06-01 DIAGNOSIS — Z3042 Encounter for surveillance of injectable contraceptive: Secondary | ICD-10-CM

## 2017-06-01 LAB — POCT URINE PREGNANCY: Preg Test, Ur: NEGATIVE

## 2017-06-01 MED ORDER — MEDROXYPROGESTERONE ACETATE 150 MG/ML IM SUSP
150.0000 mg | Freq: Once | INTRAMUSCULAR | Status: AC
  Administered 2017-06-01: 150 mg via INTRAMUSCULAR

## 2017-06-01 NOTE — Progress Notes (Signed)
PT here for depo shot 150 mg IM given RT Deltoid. Tolerated well. Return 12 weeks for next shot. Pad CMA 

## 2017-06-20 ENCOUNTER — Other Ambulatory Visit: Payer: Self-pay | Admitting: Family Medicine

## 2017-06-26 ENCOUNTER — Other Ambulatory Visit (HOSPITAL_COMMUNITY)
Admission: RE | Admit: 2017-06-26 | Discharge: 2017-06-26 | Disposition: A | Discharge status: Home / Self Care | Payer: BLUE CROSS/BLUE SHIELD | Source: Ambulatory Visit | Attending: Adult Health | Admitting: Adult Health

## 2017-06-26 ENCOUNTER — Ambulatory Visit (INDEPENDENT_AMBULATORY_CARE_PROVIDER_SITE_OTHER): Payer: BLUE CROSS/BLUE SHIELD | Admitting: Adult Health

## 2017-06-26 ENCOUNTER — Encounter: Payer: Self-pay | Admitting: Adult Health

## 2017-06-26 VITALS — BP 130/80 | HR 106 | Ht 67.5 in | Wt 175.0 lb

## 2017-06-26 DIAGNOSIS — Z3042 Encounter for surveillance of injectable contraceptive: Secondary | ICD-10-CM | POA: Diagnosis not present

## 2017-06-26 DIAGNOSIS — Z01419 Encounter for gynecological examination (general) (routine) without abnormal findings: Secondary | ICD-10-CM | POA: Insufficient documentation

## 2017-06-26 DIAGNOSIS — Z8742 Personal history of other diseases of the female genital tract: Secondary | ICD-10-CM

## 2017-06-26 DIAGNOSIS — Z3009 Encounter for other general counseling and advice on contraception: Secondary | ICD-10-CM

## 2017-06-26 MED ORDER — MEDROXYPROGESTERONE ACETATE 150 MG/ML IM SUSP: INTRAMUSCULAR | 4 refills | Status: DC

## 2017-06-26 NOTE — Progress Notes (Signed)
Patient ID: Samantha Rosales, female   DOB: 12-07-1980, 36 y.o.   MRN: [id number] History of Present Illness: Elira is a 36 year old black female in for a well woman gyn exam and pap, her last pap was 12/01/15 and was LGSIL with +HPV.She is happy with her depo.  PCP is Dr Tanya Nones.   Current Medications, Allergies, Past Medical History, Past Surgical History, Family History and Social History were reviewed in [employer] record.     Review of Systems: Patient denies any headaches, hearing loss, fatigue, blurred vision, shortness of breath, chest pain, abdominal pain, problems with bowel movements, urination, or intercourse. No joint pain currently, (has flares at times) or mood swings.    Physical Exam:BP 130/80 (BP Location: Left Arm, Patient Position: Sitting, Cuff Size: Normal)   Pulse (!) 106   Ht 5' 7.5" (1.715 m)   Wt 175 lb (79.4 kg)   BMI 27.00 kg/m  General:  Well developed, well nourished, no acute distress Skin:  Warm and dry Neck:  Midline trachea, normal thyroid, good ROM, no lymphadenopathy Lungs; Clear to auscultation bilaterally Breast:  No dominant palpable mass, retraction, or nipple discharge Cardiovascular: Regular rate and rhythm Abdomen:  Soft, non tender, no hepatosplenomegaly Pelvic:  External genitalia is normal in appearance, no lesions.  The vagina is normal in appearance. Urethra has no lesions or masses. The cervix is irregular(sp laser), pap with HPV and GC/CHL performed.  Uterus is felt to be normal size, shape, and contour.  No adnexal masses or tenderness noted.Bladder is non tender, no masses felt. Extremities/musculoskeletal:  No swelling or varicosities noted, no clubbing or cyanosis Psych:  No mood changes, alert and cooperative,seems happy PHQ 9 score 7, denies any suicidal ideations, gets a little depressed at times(when kids dad out of town)  but declines meds  Impression: 1. Encounter for gynecological examination with  Papanicolaou smear of cervix   2. Family planning   3. Encounter for surveillance of injectable contraceptive   4. History of abnormal cervical Pap smear       Plan: Check HIV and RPR Refilled depo provera 150 mg #1 with 4 refills Physical in 1 year,pap in 3 if normal Mammogram at 39 Labs with PCP

## 2017-06-27 LAB — HIV ANTIBODY (ROUTINE TESTING W REFLEX): HIV Screen 4th Generation wRfx: NONREACTIVE

## 2017-06-27 LAB — RPR: RPR Ser Ql: NONREACTIVE

## 2017-06-29 LAB — CYTOLOGY - PAP
Adequacy: ABSENT
Chlamydia: NEGATIVE
Diagnosis: NEGATIVE
HPV: NOT DETECTED
Neisseria Gonorrhea: NEGATIVE

## 2017-07-10 ENCOUNTER — Telehealth: Payer: Self-pay | Admitting: *Deleted

## 2017-07-11 MED ORDER — FLUCONAZOLE 150 MG PO TABS: ORAL_TABLET | ORAL | 1 refills | Status: DC

## 2017-07-11 NOTE — Telephone Encounter (Signed)
Left message that diflucan sent to [facility] in [place]

## 2017-07-28 ENCOUNTER — Other Ambulatory Visit: Payer: Self-pay | Admitting: Family Medicine

## 2017-07-28 DIAGNOSIS — I10 Essential (primary) hypertension: Secondary | ICD-10-CM

## 2017-08-24 ENCOUNTER — Ambulatory Visit (INDEPENDENT_AMBULATORY_CARE_PROVIDER_SITE_OTHER): Payer: BLUE CROSS/BLUE SHIELD | Admitting: Family Medicine

## 2017-08-24 ENCOUNTER — Encounter: Payer: Self-pay | Admitting: *Deleted

## 2017-08-24 ENCOUNTER — Encounter: Payer: Self-pay | Admitting: Family Medicine

## 2017-08-24 ENCOUNTER — Ambulatory Visit (INDEPENDENT_AMBULATORY_CARE_PROVIDER_SITE_OTHER): Payer: BLUE CROSS/BLUE SHIELD | Admitting: *Deleted

## 2017-08-24 VITALS — BP 162/100 | HR 92 | Temp 98.5°F | Resp 18 | Wt 180.0 lb

## 2017-08-24 DIAGNOSIS — Z23 Encounter for immunization: Secondary | ICD-10-CM | POA: Diagnosis not present

## 2017-08-24 DIAGNOSIS — I10 Essential (primary) hypertension: Secondary | ICD-10-CM | POA: Diagnosis not present

## 2017-08-24 DIAGNOSIS — Z3042 Encounter for surveillance of injectable contraceptive: Secondary | ICD-10-CM | POA: Diagnosis not present

## 2017-08-24 DIAGNOSIS — Z3202 Encounter for pregnancy test, result negative: Secondary | ICD-10-CM

## 2017-08-24 LAB — POCT URINE PREGNANCY: Preg Test, Ur: NEGATIVE

## 2017-08-24 MED ORDER — MEDROXYPROGESTERONE ACETATE 150 MG/ML IM SUSP
150.0000 mg | Freq: Once | INTRAMUSCULAR | Status: AC
  Administered 2017-08-24: 150 mg via INTRAMUSCULAR

## 2017-08-24 NOTE — Progress Notes (Signed)
Subjective:    Patient ID: Samantha Rosales, female    DOB: February 18, 1981, 36 y.o.   MRN: [id number]  HPI  Over the last week, the patient has had several episodes where she developed pressure in her forehead. At times the pressure would be intense. At other times the pressure would be mild. However is always present. She also reports some mild dizziness and feeling "foggy headed". She also reports a tingling sensation in her chest. She denies any chest pains. She denies any chest fullness. She describes it like an anxiety type feeling or uneasiness. Her blood pressure is extremely high. I verified this myself and actually found to be higher at 170/100. She denies any angina. She denies any dyspnea on exertion. She denies any shortness of breath. I believe her symptoms are likely secondary to elevated blood pressure. She is compliant with her medication Past Medical History:  Diagnosis Date  . Abnormal Pap smear   . Allergic rhinitis   . BV (bacterial vaginosis)   . GERD (gastroesophageal reflux disease)   . HSV-2 (herpes simplex virus 2) infection   . Hypertension   . Rheumatoid arthritis (HCC)   . Seizures (HCC)    dx as child with epilepsy but no seizures since 89 and on no meds  . Vaginal Pap smear, abnormal    Past Surgical History:  Procedure Laterality Date  . CERVICAL CONIZATION W/BX  12/20/2012   Procedure: CONIZATION CERVIX WITH BIOPSY;  Surgeon: Lazaro Arms, MD;  Location: [facility];  Service: Gynecology;  Laterality: N/A;  . HOLMIUM LASER APPLICATION  12/20/2012   Procedure: HOLMIUM LASER APPLICATION;  Surgeon: Lazaro Arms, MD;  Location: [facility];  Service: Gynecology;  Laterality: N/A;   Current Outpatient Prescriptions on File Prior to Visit  Medication Sig Dispense Refill  . Biotin 1000 MCG tablet Take 1,000 mcg by mouth daily.    . Cholecalciferol (VITAMIN D-3) 1000 UNITS CAPS Take 1 capsule by mouth daily.    . fluticasone (FLONASE) 50 MCG/ACT nasal spray USE TWO SPRAY(S)  IN EACH NOSTRIL ONCE DAILY 16 g 11  . folic acid (FOLVITE) 1 MG tablet Take 1 mg by mouth daily.    Marland Kitchen GARLIC PO Take by mouth daily.    Marland Kitchen losartan-hydrochlorothiazide (HYZAAR) 50-12.5 MG tablet TAKE ONE TABLET BY MOUTH ONCE DAILY 30 tablet 2  . medroxyPROGESTERone (DEPO-PROVERA) 150 MG/ML injection INJECT 1 ML INTRAMUSCULARLY IN DOCTOR'S OFFICE EVERY 3 MONTHS. 1 mL 4  . methotrexate (RHEUMATREX) 2.5 MG tablet Take 6 tablets by mouth weekly  Caution:Chemotherapy. Protect from light. On Monday    . Omega-3 Fatty Acids (FISH OIL) 1000 MG CAPS Take 1,000 mg by mouth daily.    Marland Kitchen omeprazole (PRILOSEC) 20 MG capsule TAKE ONE CAPSULE BY MOUTH ONCE DAILY 30 capsule 11   No current facility-administered medications on file prior to visit.    Allergies  Allergen Reactions  . Penicillins    Social History   Social History  . Marital status: Single    Spouse name: N/A  . Number of children: 2  . Years of education: N/A   Occupational History  . [employer]    Social History Main Topics  . Smoking status: Current Every Day Smoker    Packs/day: 0.50    Years: 15.00    Types: Cigarettes  . Smokeless tobacco: Never Used  . Alcohol use Yes     Comment: social  . Drug use: No  . Sexual  activity: Yes    Birth control/ protection: Injection     Comment: Depo Provera every 3 months   Other Topics Concern  . Not on file   Social History Narrative  . No narrative on file     Review of Systems  All other systems reviewed and are negative.      Objective:   Physical Exam  Constitutional: She is oriented to person, place, and time. She appears well-developed and well-nourished. No distress.  HENT:  Head: Normocephalic and atraumatic.  Right Ear: External ear normal.  Left Ear: External ear normal.  Nose: Nose normal.  Mouth/Throat: Oropharynx is clear and moist. No oropharyngeal exudate.  Eyes: Pupils are equal, round, and reactive to light.  Conjunctivae are normal.  Neck: Neck supple. No JVD present. No thyromegaly present.  Cardiovascular: Normal rate, regular rhythm and intact distal pulses.  Exam reveals no gallop and no friction rub.   No murmur heard. Pulmonary/Chest: Effort normal and breath sounds normal. No respiratory distress. She has no wheezes. She has no rales. She exhibits no tenderness.  Abdominal: Soft. Bowel sounds are normal. She exhibits no distension. There is no tenderness. There is no rebound and no guarding.  Lymphadenopathy:    She has no cervical adenopathy.  Neurological: She is alert and oriented to person, place, and time. She has normal reflexes. She displays normal reflexes. No cranial nerve deficit. She exhibits normal muscle tone. Coordination normal.  Skin: She is not diaphoretic.  Psychiatric: She has a normal mood and affect. Her behavior is normal. Thought content normal.  Vitals reviewed.         Assessment & Plan:  Need for prophylactic vaccination and inoculation against influenza - Plan: Flu Vaccine QUAD 6+ mos PF IM (Fluarix Quad PF)  Benign essential HTN - Plan: CBC with Differential/Platelet, COMPLETE METABOLIC PANEL WITH GFR  Blood pressure is extremely high. I believe this is why she's having the dull headache. I recommended adding bystolic 10 mg by mouth daily to her current medication rechecking her blood pressure here next week as well as a reassessment to see if her symptoms are improving. Also check CBC as well as a CMP for any laboratory abnormalities. I believe the unusual sensation in her chest is not cardiac. I believe some of this is related to anxiety. She has a very difficult time even describing the feeling in her chest. It is not a pain. It is not a pressure. It is a tingling sensation that comes and goes. To me the sounds in the more anxiety related or possibly related to elevated blood pressure. Reassess in one week. She denies any hemoptysis. She denies any pleurisy.  She denies any shortness of breath. Her exam today is completely reassuring.

## 2017-08-24 NOTE — Progress Notes (Signed)
Depo Provera 150mg IM given in left deltoid with no complications. Pt to return in 12 weeks for next injection.  

## 2017-08-27 ENCOUNTER — Other Ambulatory Visit: Payer: Self-pay | Admitting: Family Medicine

## 2017-08-27 ENCOUNTER — Encounter: Payer: Self-pay | Admitting: Family Medicine

## 2017-08-27 DIAGNOSIS — Z23 Encounter for immunization: Secondary | ICD-10-CM | POA: Diagnosis not present

## 2017-08-27 DIAGNOSIS — R945 Abnormal results of liver function studies: Principal | ICD-10-CM

## 2017-08-27 DIAGNOSIS — R7989 Other specified abnormal findings of blood chemistry: Secondary | ICD-10-CM

## 2017-08-27 LAB — CBC WITH DIFFERENTIAL/PLATELET
Basophils Absolute: 31 cells/uL (ref 0–200)
Basophils Relative: 0.6 %
Eosinophils Absolute: 82 cells/uL (ref 15–500)
Eosinophils Relative: 1.6 %
HCT: 33.4 % — ABNORMAL LOW (ref 35.0–45.0)
Hemoglobin: 11.5 g/dL — ABNORMAL LOW (ref 11.7–15.5)
Lymphs Abs: 2244 cells/uL (ref 850–3900)
MCH: 32.5 pg (ref 27.0–33.0)
MCHC: 34.4 g/dL (ref 32.0–36.0)
MCV: 94.4 fL (ref 80.0–100.0)
MPV: 11.6 fL (ref 7.5–12.5)
Monocytes Relative: 10.1 %
Neutro Abs: 2229 cells/uL (ref 1500–7800)
Neutrophils Relative %: 43.7 %
Platelets: 221 10*3/uL (ref 140–400)
RBC: 3.54 10*6/uL — ABNORMAL LOW (ref 3.80–5.10)
RDW: 12.8 % (ref 11.0–15.0)
Total Lymphocyte: 44 %
WBC mixed population: 515 cells/uL (ref 200–950)
WBC: 5.1 10*3/uL (ref 3.8–10.8)

## 2017-08-27 LAB — COMPLETE METABOLIC PANEL WITH GFR
AG Ratio: 1.5 (calc) (ref 1.0–2.5)
ALT: 55 U/L — ABNORMAL HIGH (ref 6–29)
AST: 45 U/L — ABNORMAL HIGH (ref 10–30)
Albumin: 4.3 g/dL (ref 3.6–5.1)
Alkaline phosphatase (APISO): 81 U/L (ref 33–115)
BUN: 14 mg/dL (ref 7–25)
CO2: 26 mmol/L (ref 20–32)
Calcium: 9.7 mg/dL (ref 8.6–10.2)
Chloride: 105 mmol/L (ref 98–110)
Creat: 0.72 mg/dL (ref 0.50–1.10)
GFR, Est African American: 124 mL/min/{1.73_m2} (ref >=60)
GFR, Est Non African American: 107 mL/min/{1.73_m2} (ref >=60)
Globulin: 2.9 g/dL (calc) (ref 1.9–3.7)
Glucose, Bld: 88 mg/dL (ref 65–99)
Potassium: 4.1 mmol/L (ref 3.5–5.3)
Sodium: 140 mmol/L (ref 135–146)
Total Bilirubin: 0.3 mg/dL (ref 0.2–1.2)
Total Protein: 7.2 g/dL (ref 6.1–8.1)

## 2017-08-31 ENCOUNTER — Ambulatory Visit (INDEPENDENT_AMBULATORY_CARE_PROVIDER_SITE_OTHER): Payer: BLUE CROSS/BLUE SHIELD | Admitting: Family Medicine

## 2017-08-31 ENCOUNTER — Encounter: Payer: Self-pay | Admitting: Family Medicine

## 2017-08-31 VITALS — BP 118/70 | HR 86 | Temp 98.4°F | Resp 14 | Ht 68.0 in | Wt 176.0 lb

## 2017-08-31 DIAGNOSIS — I10 Essential (primary) hypertension: Secondary | ICD-10-CM

## 2017-08-31 DIAGNOSIS — B001 Herpesviral vesicular dermatitis: Secondary | ICD-10-CM | POA: Diagnosis not present

## 2017-08-31 MED ORDER — NEBIVOLOL HCL 10 MG PO TABS: 10.0000 mg | ORAL_TABLET | Freq: Every day | ORAL | 3 refills | Status: DC

## 2017-08-31 MED ORDER — VALACYCLOVIR HCL 1 G PO TABS: 2000.0000 mg | ORAL_TABLET | Freq: Two times a day (BID) | ORAL | 5 refills | Status: DC

## 2017-08-31 NOTE — Progress Notes (Signed)
Subjective:    Patient ID: Samantha Rosales, female    DOB: [birth year], 36 y.o.   MRN: [id number]  HPI 08/24/17 Over the last week, the patient has had several episodes where she developed pressure in her forehead. At times the pressure would be intense. At other times the pressure would be mild. However is always present. She also reports some mild dizziness and feeling "foggy headed". She also reports a tingling sensation in her chest. She denies any chest pains. She denies any chest fullness. She describes it like an anxiety type feeling or uneasiness. Her blood pressure is extremely high. I verified this myself and actually found to be higher at 170/100. She denies any angina. She denies any dyspnea on exertion. She denies any shortness of breath. I believe her symptoms are likely secondary to elevated blood pressure. She is compliant with her medication.  At that time, my plan was: Blood pressure is extremely high. I believe this is why she's having the dull headache. I recommended adding bystolic 10 mg by mouth daily to her current medication rechecking her blood pressure here next week as well as a reassessment to see if her symptoms are improving. Also check CBC as well as a CMP for any laboratory abnormalities. I believe the unusual sensation in her chest is not cardiac. I believe some of this is related to anxiety. She has a very difficult time even describing the feeling in her chest. It is not a pain. It is not a pressure. It is a tingling sensation that comes and goes. To me the sounds in the more anxiety related or possibly related to elevated blood pressure. Reassess in one week. She denies any hemoptysis. She denies any pleurisy. She denies any shortness of breath. Her exam today is completely reassuring.  08/31/17 Blood pressure is much better. Today she is approximately 120/78 when I checked her blood pressure. She's been getting similar readings at home. She denies any side effects on the  medication. The pressure in her face, the unusual sensation in her chest, the headaches have all subsided. She feels much better. She does have a cold sore on the lower right lip that has been there for 24 hours. She is requesting treatment for this. Past Medical History:  Diagnosis Date  . Abnormal Pap smear   . Allergic rhinitis   . BV (bacterial vaginosis)   . GERD (gastroesophageal reflux disease)   . HSV-2 (herpes simplex virus 2) infection   . Hypertension   . Rheumatoid arthritis (HCC)   . Seizures (HCC)    dx as child with epilepsy but no seizures since 89 and on no meds  . Vaginal Pap smear, abnormal    Past Surgical History:  Procedure Laterality Date  . CERVICAL CONIZATION W/BX  12/20/2012   Procedure: CONIZATION CERVIX WITH BIOPSY;  Surgeon: Lazaro Arms, MD;  Location: [facility];  Service: Gynecology;  Laterality: N/A;  . HOLMIUM LASER APPLICATION  12/20/2012   Procedure: HOLMIUM LASER APPLICATION;  Surgeon: Lazaro Arms, MD;  Location: [facility];  Service: Gynecology;  Laterality: N/A;   Current Outpatient Prescriptions on File Prior to Visit  Medication Sig Dispense Refill  . Biotin 1000 MCG tablet Take 1,000 mcg by mouth daily.    . Cholecalciferol (VITAMIN D-3) 1000 UNITS CAPS Take 1 capsule by mouth daily.    . fluticasone (FLONASE) 50 MCG/ACT nasal spray USE TWO SPRAY(S) IN EACH NOSTRIL ONCE DAILY 16 g 11  . folic acid (  FOLVITE) 1 MG tablet Take 1 mg by mouth daily.    Marland Kitchen GARLIC PO Take by mouth daily.    Marland Kitchen HUMIRA PEN 40 MG/0.8ML PNKT   0  . losartan-hydrochlorothiazide (HYZAAR) 50-12.5 MG tablet TAKE ONE TABLET BY MOUTH ONCE DAILY 30 tablet 2  . medroxyPROGESTERone (DEPO-PROVERA) 150 MG/ML injection INJECT 1 ML INTRAMUSCULARLY IN DOCTOR'S OFFICE EVERY 3 MONTHS. 1 mL 4  . methotrexate (RHEUMATREX) 2.5 MG tablet Take 6 tablets by mouth weekly  Caution:Chemotherapy. Protect from light. On Monday    . Omega-3 Fatty Acids (FISH OIL) 1000 MG CAPS Take 1,000 mg by mouth daily.     Marland Kitchen omeprazole (PRILOSEC) 20 MG capsule TAKE ONE CAPSULE BY MOUTH ONCE DAILY 30 capsule 11   No current facility-administered medications on file prior to visit.    Allergies  Allergen Reactions  . Penicillins    Social History   Social History  . Marital status: Single    Spouse name: N/A  . Number of children: 2  . Years of education: N/A   Occupational History  . [employer]    Social History Main Topics  . Smoking status: Current Every Day Smoker    Packs/day: 0.50    Years: 15.00    Types: Cigarettes  . Smokeless tobacco: Never Used  . Alcohol use Yes     Comment: social  . Drug use: No  . Sexual activity: Yes    Birth control/ protection: Injection     Comment: Depo Provera every 3 months   Other Topics Concern  . Not on file   Social History Narrative  . No narrative on file     Review of Systems  All other systems reviewed and are negative.      Objective:   Physical Exam  Constitutional: She is oriented to person, place, and time. She appears well-developed and well-nourished. No distress.  HENT:  Head: Normocephalic and atraumatic.  Right Ear: External ear normal.  Left Ear: External ear normal.  Nose: Nose normal.  Mouth/Throat: Oropharynx is clear and moist. No oropharyngeal exudate.  Eyes: Pupils are equal, round, and reactive to light. Conjunctivae are normal.  Neck: Neck supple. No JVD present. No thyromegaly present.  Cardiovascular: Normal rate, regular rhythm and intact distal pulses.  Exam reveals no gallop and no friction rub.   No murmur heard. Pulmonary/Chest: Effort normal and breath sounds normal. No respiratory distress. She has no wheezes. She has no rales. She exhibits no tenderness.  Abdominal: Soft. Bowel sounds are normal. She exhibits no distension. There is no tenderness. There is no rebound and no guarding.  Lymphadenopathy:    She has no cervical adenopathy.  Neurological: She is  alert and oriented to person, place, and time. She has normal reflexes. No cranial nerve deficit. She exhibits normal muscle tone. Coordination normal.  Skin: She is not diaphoretic.  Psychiatric: She has a normal mood and affect. Her behavior is normal. Thought content normal.  Vitals reviewed.         Assessment & Plan:  Ess HTN, Cold sore  Continue bystolic 10 mg poqday.  Watch blood pressure closely. If blood pressure continues to drop, we may need to reduce the dose of this medication. Continue to encourage smoking cessation. Treat cold sore with bowel checks 2 g by mouth twice a day for 1 day

## 2017-09-11 ENCOUNTER — Ambulatory Visit (INDEPENDENT_AMBULATORY_CARE_PROVIDER_SITE_OTHER): Payer: BLUE CROSS/BLUE SHIELD | Admitting: Family Medicine

## 2017-09-11 ENCOUNTER — Encounter: Payer: Self-pay | Admitting: Family Medicine

## 2017-09-11 VITALS — BP 130/80 | HR 90 | Temp 98.9°F | Resp 16 | Ht 68.0 in | Wt 174.0 lb

## 2017-09-11 DIAGNOSIS — Z0000 Encounter for general adult medical examination without abnormal findings: Secondary | ICD-10-CM

## 2017-09-11 DIAGNOSIS — R4589 Other symptoms and signs involving emotional state: Secondary | ICD-10-CM

## 2017-09-11 DIAGNOSIS — I10 Essential (primary) hypertension: Secondary | ICD-10-CM

## 2017-09-11 DIAGNOSIS — Z23 Encounter for immunization: Secondary | ICD-10-CM

## 2017-09-11 LAB — LIPID PANEL
Cholesterol: 251 mg/dL — ABNORMAL HIGH (ref <200)
HDL: 22 mg/dL — ABNORMAL LOW (ref >50)
Non-HDL Cholesterol (Calc): 229 mg/dL (calc) — ABNORMAL HIGH (ref <130)
Total CHOL/HDL Ratio: 11.4 (calc) — ABNORMAL HIGH (ref <5.0)
Triglycerides: 1786 mg/dL — ABNORMAL HIGH (ref <150)

## 2017-09-11 LAB — COMPLETE METABOLIC PANEL WITH GFR
AG Ratio: 1.4 (calc) (ref 1.0–2.5)
ALT: 54 U/L — ABNORMAL HIGH (ref 6–29)
AST: 65 U/L — ABNORMAL HIGH (ref 10–30)
Albumin: 4.5 g/dL (ref 3.6–5.1)
Alkaline phosphatase (APISO): 98 U/L (ref 33–115)
BUN: 11 mg/dL (ref 7–25)
CO2: 23 mmol/L (ref 20–32)
Calcium: 9.5 mg/dL (ref 8.6–10.2)
Chloride: 96 mmol/L — ABNORMAL LOW (ref 98–110)
Creat: 0.81 mg/dL (ref 0.50–1.10)
GFR, Est African American: 108 mL/min/{1.73_m2} (ref >=60)
GFR, Est Non African American: 93 mL/min/{1.73_m2} (ref >=60)
Globulin: 3.2 g/dL (calc) (ref 1.9–3.7)
Glucose, Bld: 97 mg/dL (ref 65–99)
Potassium: 3.7 mmol/L (ref 3.5–5.3)
Sodium: 131 mmol/L — ABNORMAL LOW (ref 135–146)
Total Bilirubin: 1 mg/dL (ref 0.2–1.2)
Total Protein: 7.7 g/dL (ref 6.1–8.1)

## 2017-09-11 LAB — TSH: TSH: 2.69 mIU/L

## 2017-09-11 MED ORDER — CLONAZEPAM 0.5 MG PO TABS: 0.5000 mg | ORAL_TABLET | Freq: Two times a day (BID) | ORAL | 1 refills | Status: DC | PRN

## 2017-09-11 NOTE — Progress Notes (Signed)
Subjective:    Patient ID: Samantha Rosales, female    DOB: 11-23-1981, 36 y.o.   MRN: [id number]  HPI 08/24/17 Over the last week, the patient has had several episodes where she developed pressure in her forehead. At times the pressure would be intense. At other times the pressure would be mild. However is always present. She also reports some mild dizziness and feeling "foggy headed". She also reports a tingling sensation in her chest. She denies any chest pains. She denies any chest fullness. She describes it like an anxiety type feeling or uneasiness. Her blood pressure is extremely high. I verified this myself and actually found to be higher at 170/100. She denies any angina. She denies any dyspnea on exertion. She denies any shortness of breath. I believe her symptoms are likely secondary to elevated blood pressure. She is compliant with her medication.  At that time, my plan was: Blood pressure is extremely high. I believe this is why she's having the dull headache. I recommended adding bystolic 10 mg by mouth daily to her current medication rechecking her blood pressure here next week as well as a reassessment to see if her symptoms are improving. Also check CBC as well as a CMP for any laboratory abnormalities. I believe the unusual sensation in her chest is not cardiac. I believe some of this is related to anxiety. She has a very difficult time even describing the feeling in her chest. It is not a pain. It is not a pressure. It is a tingling sensation that comes and goes. To me the sounds in the more anxiety related or possibly related to elevated blood pressure. Reassess in one week. She denies any hemoptysis. She denies any pleurisy. She denies any shortness of breath. Her exam today is completely reassuring.  08/31/17 Blood pressure is much better. Today she is approximately 120/78 when I checked her blood pressure. She's been getting similar readings at home. She denies any side effects on the  medication. The pressure in her face, the unusual sensation in her chest, the headaches have all subsided. She feels much better. She does have a cold sore on the lower right lip that has been there for 24 hours. She is requesting treatment for this.  At that time, my plan was: Continue bystolic 10 mg poqday.  Watch blood pressure closely. If blood pressure continues to drop, we may need to reduce the dose of this medication. Continue to encourage smoking cessation. Treat cold sore with bowel checks 2 g by mouth twice a day for 1 day  09/11/17 The patient has had no further episodes of chest discomfort or the unusual sensation in her chest. Furthermore her headaches have subsided. Her blood pressure remains well controlled on bystolic.  That being said, she continues to feel anxious. She continues to have difficult time sleeping. She states that her thoughts race at night. She does report some mild anxiety, at least she thinks is anxiety. She denies any depression. She denies anhedonia. She denies any suicidal ideation. She denies any family history of mental illness. She is here today for a complete physical exam. She sees a gynecologist for her Pap smear, breast exam. She is due today for repeat a CMP to monitor the mild elevation in her liver function test. She is also due for fasting lipid panel. Recent CBC was normal. I have not screen the patient for hyperthyroidism given her recent symptoms.  She is due for her flu shot. She believes  she had Pneumovax 23 at her rheumatologist's office. Past Medical History:  Diagnosis Date  . Abnormal Pap smear   . Allergic rhinitis   . BV (bacterial vaginosis)   . GERD (gastroesophageal reflux disease)   . HSV-2 (herpes simplex virus 2) infection   . Hypertension   . Rheumatoid arthritis (HCC)   . Seizures (HCC)    dx as child with epilepsy but no seizures since 89 and on no meds  . Vaginal Pap smear, abnormal    Past Surgical History:  Procedure Laterality  Date  . CERVICAL CONIZATION W/BX  12/20/2012   Procedure: CONIZATION CERVIX WITH BIOPSY;  Surgeon: Lazaro Arms, MD;  Location: [facility];  Service: Gynecology;  Laterality: N/A;  . HOLMIUM LASER APPLICATION  12/20/2012   Procedure: HOLMIUM LASER APPLICATION;  Surgeon: Lazaro Arms, MD;  Location: [facility];  Service: Gynecology;  Laterality: N/A;   Current Outpatient Prescriptions on File Prior to Visit  Medication Sig Dispense Refill  . Biotin 1000 MCG tablet Take 1,000 mcg by mouth daily.    . Cholecalciferol (VITAMIN D-3) 1000 UNITS CAPS Take 1 capsule by mouth daily.    . fluticasone (FLONASE) 50 MCG/ACT nasal spray USE TWO SPRAY(S) IN EACH NOSTRIL ONCE DAILY 16 g 11  . folic acid (FOLVITE) 1 MG tablet Take 1 mg by mouth daily.    Marland Kitchen GARLIC PO Take by mouth daily.    Marland Kitchen HUMIRA PEN 40 MG/0.8ML PNKT   0  . losartan-hydrochlorothiazide (HYZAAR) 50-12.5 MG tablet TAKE ONE TABLET BY MOUTH ONCE DAILY 30 tablet 2  . medroxyPROGESTERone (DEPO-PROVERA) 150 MG/ML injection INJECT 1 ML INTRAMUSCULARLY IN DOCTOR'S OFFICE EVERY 3 MONTHS. 1 mL 4  . methotrexate (RHEUMATREX) 2.5 MG tablet Take 6 tablets by mouth weekly  Caution:Chemotherapy. Protect from light. On Monday    . nebivolol (BYSTOLIC) 10 MG tablet Take 1 tablet (10 mg total) by mouth daily. 90 tablet 3  . Omega-3 Fatty Acids (FISH OIL) 1000 MG CAPS Take 1,000 mg by mouth daily.    Marland Kitchen omeprazole (PRILOSEC) 20 MG capsule TAKE ONE CAPSULE BY MOUTH ONCE DAILY 30 capsule 11   No current facility-administered medications on file prior to visit.    Allergies  Allergen Reactions  . Penicillins    Social History   Social History  . Marital status: Single    Spouse name: N/A  . Number of children: 2  . Years of education: N/A   Occupational History  . [employer]    Social History Main Topics  . Smoking status: Current Every Day Smoker    Packs/day: 0.50    Years: 15.00    Types: Cigarettes  .  Smokeless tobacco: Never Used  . Alcohol use Yes     Comment: social  . Drug use: No  . Sexual activity: Yes    Birth control/ protection: Injection     Comment: Depo Provera every 3 months   Other Topics Concern  . Not on file   Social History Narrative  . No narrative on file   Family History  Problem Relation Age of Onset  . Hypertension Mother   . Hypertension Father   . Cancer Paternal Uncle        pancreatic cancer      Review of Systems  All other systems reviewed and are negative.      Objective:   Physical Exam  Constitutional: She is oriented to person, place, and time. She appears well-developed  and well-nourished. No distress.  HENT:  Head: Normocephalic and atraumatic.  Right Ear: External ear normal.  Left Ear: External ear normal.  Nose: Nose normal.  Mouth/Throat: Oropharynx is clear and moist. No oropharyngeal exudate.  Eyes: Pupils are equal, round, and reactive to light. Conjunctivae are normal. Right eye exhibits no discharge. Left eye exhibits no discharge. No scleral icterus.  Neck: Neck supple. No JVD present. No tracheal deviation present. No thyromegaly present.  Cardiovascular: Normal rate, regular rhythm and intact distal pulses.  Exam reveals no gallop and no friction rub.   No murmur heard. Pulmonary/Chest: Effort normal and breath sounds normal. No stridor. No respiratory distress. She has no wheezes. She has no rales. She exhibits no tenderness.  Abdominal: Soft. Bowel sounds are normal. She exhibits no distension and no mass. There is no tenderness. There is no rebound and no guarding.  Musculoskeletal: She exhibits no edema, tenderness or deformity.  Lymphadenopathy:    She has no cervical adenopathy.  Neurological: She is alert and oriented to person, place, and time. She has normal reflexes. She displays normal reflexes. No cranial nerve deficit. She exhibits normal muscle tone. Coordination normal.  Skin: Skin is warm and dry. No  rash noted. She is not diaphoretic. No erythema. No pallor.  Psychiatric: She has a normal mood and affect. Her behavior is normal. Judgment and thought content normal.  Vitals reviewed.         Assessment & Plan:  Benign essential HTN - Plan: COMPLETE METABOLIC PANEL WITH GFR, Lipid panel, TSH  Feeling anxious - Plan: TSH  Routine general medical examination at a health care facility  Her blood pressure is certainly better controlled. However I question if some of her symptoms could be related to anxiety. I would like to check a TSH to rule out hyperthyroidism as a cause of her symptoms. I would also like to repeat a CMP to monitor the mild elevation in her liver function tests. I will also check a fasting lipid panel. Her goal LDL cholesterol is less than 130. She will receive her flu shot today. She is LAD had Pneumovax 23 at her rheumatologist's office according to her. Her Pap smear and breast exam is performed at her gynecologist office. Patient agrees to try a test. I will start her on Klonopin 0.25 mg by mouth twice a day scheduled for one week to see if her symptoms improve somewhat. She will take it when she gets home from work in the afternoon and prior to bed. If her symptoms improve, he would point towards anxiety being at the root of some of her symptoms and we can discuss better long-term options and Klonopin. I explained the medication can be addictive and habit-forming if taken for a long period of time but I do not see a problem simply taking it for one week to see if symptoms improve

## 2017-09-11 NOTE — Addendum Note (Signed)
Addended by: Phineas Semen A on: 09/11/2017 11:58 AM   Modules accepted: Orders

## 2017-09-19 ENCOUNTER — Other Ambulatory Visit: Payer: Self-pay | Admitting: Family Medicine

## 2017-09-19 ENCOUNTER — Encounter: Payer: Self-pay | Admitting: Family Medicine

## 2017-09-19 DIAGNOSIS — E781 Pure hyperglyceridemia: Secondary | ICD-10-CM

## 2017-09-19 DIAGNOSIS — R7989 Other specified abnormal findings of blood chemistry: Secondary | ICD-10-CM

## 2017-09-19 DIAGNOSIS — R945 Abnormal results of liver function studies: Secondary | ICD-10-CM

## 2017-09-21 ENCOUNTER — Other Ambulatory Visit: Payer: BLUE CROSS/BLUE SHIELD

## 2017-09-21 DIAGNOSIS — E781 Pure hyperglyceridemia: Secondary | ICD-10-CM

## 2017-09-21 DIAGNOSIS — R7989 Other specified abnormal findings of blood chemistry: Secondary | ICD-10-CM

## 2017-09-21 DIAGNOSIS — R945 Abnormal results of liver function studies: Secondary | ICD-10-CM

## 2017-09-21 LAB — COMPREHENSIVE METABOLIC PANEL
AG Ratio: 1.3 (calc) (ref 1.0–2.5)
ALT: 51 U/L — ABNORMAL HIGH (ref 6–29)
AST: 48 U/L — ABNORMAL HIGH (ref 10–30)
Albumin: 4.1 g/dL (ref 3.6–5.1)
Alkaline phosphatase (APISO): 76 U/L (ref 33–115)
BUN: 12 mg/dL (ref 7–25)
CO2: 24 mmol/L (ref 20–32)
Calcium: 9.6 mg/dL (ref 8.6–10.2)
Chloride: 104 mmol/L (ref 98–110)
Creat: 0.75 mg/dL (ref 0.50–1.10)
Globulin: 3.1 g/dL (calc) (ref 1.9–3.7)
Glucose, Bld: 88 mg/dL (ref 65–99)
Potassium: 4.2 mmol/L (ref 3.5–5.3)
Sodium: 139 mmol/L (ref 135–146)
Total Bilirubin: 0.4 mg/dL (ref 0.2–1.2)
Total Protein: 7.2 g/dL (ref 6.1–8.1)

## 2017-09-21 LAB — LIPID PANEL
Cholesterol: 204 mg/dL — ABNORMAL HIGH (ref <200)
HDL: 42 mg/dL — ABNORMAL LOW (ref >50)
Non-HDL Cholesterol (Calc): 162 mg/dL (calc) — ABNORMAL HIGH (ref <130)
Total CHOL/HDL Ratio: 4.9 (calc) (ref <5.0)
Triglycerides: 912 mg/dL — ABNORMAL HIGH (ref <150)

## 2017-09-21 LAB — HEPATIC FUNCTION PANEL
AG Ratio: 1.3 (calc) (ref 1.0–2.5)
ALT: 51 U/L — ABNORMAL HIGH (ref 6–29)
AST: 48 U/L — ABNORMAL HIGH (ref 10–30)
Albumin: 4.1 g/dL (ref 3.6–5.1)
Alkaline phosphatase (APISO): 76 U/L (ref 33–115)
Bilirubin, Direct: 0.1 mg/dL (ref 0.0–0.2)
Globulin: 3.1 g/dL (calc) (ref 1.9–3.7)
Indirect Bilirubin: 0.3 mg/dL (calc) (ref 0.2–1.2)
Total Bilirubin: 0.4 mg/dL (ref 0.2–1.2)
Total Protein: 7.2 g/dL (ref 6.1–8.1)

## 2017-09-26 ENCOUNTER — Other Ambulatory Visit: Payer: Self-pay | Admitting: Family Medicine

## 2017-09-26 ENCOUNTER — Encounter: Payer: Self-pay | Admitting: Family Medicine

## 2017-09-26 MED ORDER — FENOFIBRATE 160 MG PO TABS: 160.0000 mg | ORAL_TABLET | Freq: Every day | ORAL | 3 refills | Status: DC

## 2017-10-05 ENCOUNTER — Ambulatory Visit (INDEPENDENT_AMBULATORY_CARE_PROVIDER_SITE_OTHER): Payer: BLUE CROSS/BLUE SHIELD | Admitting: Family Medicine

## 2017-10-05 VITALS — BP 140/80 | HR 92 | Temp 98.5°F | Resp 14 | Ht 68.0 in | Wt 179.0 lb

## 2017-10-05 DIAGNOSIS — R4589 Other symptoms and signs involving emotional state: Secondary | ICD-10-CM | POA: Diagnosis not present

## 2017-10-05 DIAGNOSIS — E781 Pure hyperglyceridemia: Secondary | ICD-10-CM | POA: Diagnosis not present

## 2017-10-05 MED ORDER — ESCITALOPRAM OXALATE 10 MG PO TABS
10.0000 mg | ORAL_TABLET | Freq: Every day | ORAL | 3 refills | Status: DC
Start: 2017-10-05 — End: 2018-05-03

## 2017-10-05 NOTE — Progress Notes (Signed)
Subjective:    Patient ID: Samantha Rosales, female    DOB: 11-23-1981, 36 y.o.   MRN: [id number]  HPI 08/24/17 Over the last week, the patient has had several episodes where she developed pressure in her forehead. At times the pressure would be intense. At other times the pressure would be mild. However is always present. She also reports some mild dizziness and feeling "foggy headed". She also reports a tingling sensation in her chest. She denies any chest pains. She denies any chest fullness. She describes it like an anxiety type feeling or uneasiness. Her blood pressure is extremely high. I verified this myself and actually found to be higher at 170/100. She denies any angina. She denies any dyspnea on exertion. She denies any shortness of breath. I believe her symptoms are likely secondary to elevated blood pressure. She is compliant with her medication.  At that time, my plan was: Blood pressure is extremely high. I believe this is why she's having the dull headache. I recommended adding bystolic 10 mg by mouth daily to her current medication rechecking her blood pressure here next week as well as a reassessment to see if her symptoms are improving. Also check CBC as well as a CMP for any laboratory abnormalities. I believe the unusual sensation in her chest is not cardiac. I believe some of this is related to anxiety. She has a very difficult time even describing the feeling in her chest. It is not a pain. It is not a pressure. It is a tingling sensation that comes and goes. To me the sounds in the more anxiety related or possibly related to elevated blood pressure. Reassess in one week. She denies any hemoptysis. She denies any pleurisy. She denies any shortness of breath. Her exam today is completely reassuring.  08/31/17 Blood pressure is much better. Today she is approximately 120/78 when I checked her blood pressure. She's been getting similar readings at home. She denies any side effects on the  medication. The pressure in her face, the unusual sensation in her chest, the headaches have all subsided. She feels much better. She does have a cold sore on the lower right lip that has been there for 24 hours. She is requesting treatment for this.  At that time, my plan was: Continue bystolic 10 mg poqday.  Watch blood pressure closely. If blood pressure continues to drop, we may need to reduce the dose of this medication. Continue to encourage smoking cessation. Treat cold sore with bowel checks 2 g by mouth twice a day for 1 day  09/11/17 The patient has had no further episodes of chest discomfort or the unusual sensation in her chest. Furthermore her headaches have subsided. Her blood pressure remains well controlled on bystolic.  That being said, she continues to feel anxious. She continues to have difficult time sleeping. She states that her thoughts race at night. She does report some mild anxiety, at least she thinks is anxiety. She denies any depression. She denies anhedonia. She denies any suicidal ideation. She denies any family history of mental illness. She is here today for a complete physical exam. She sees a gynecologist for her Pap smear, breast exam. She is due today for repeat a CMP to monitor the mild elevation in her liver function test. She is also due for fasting lipid panel. Recent CBC was normal. I have not screen the patient for hyperthyroidism given her recent symptoms.  She is due for her flu shot. She believes  she had Pneumovax 23 at her rheumatologist's office.  AT that time, my plan was: Her blood pressure is certainly better controlled. However I question if some of her symptoms could be related to anxiety. I would like to check a TSH to rule out hyperthyroidism as a cause of her symptoms. I would also like to repeat a CMP to monitor the mild elevation in her liver function tests. I will also check a fasting lipid panel. Her goal LDL cholesterol is less than 130. She will  receive her flu shot today. She is LAD had Pneumovax 23 at her rheumatologist's office according to her. Her Pap smear and breast exam is performed at her gynecologist office. Patient agrees to try a test. I will start her on Klonopin 0.25 mg by mouth twice a day scheduled for one week to see if her symptoms improve somewhat. She will take it when she gets home from work in the afternoon and prior to bed. If her symptoms improve, he would point towards anxiety being at the root of some of her symptoms and we can discuss better long-term options and Klonopin. I explained the medication can be addictive and habit-forming if taken for a long period of time but I do not see a problem simply taking it for one week to see if symptoms improve  10/05/17 Labs revealed significantly elevated triglycerides (1786 initially and confirmed at 912).  I recommended to the patient to:  "Triglycerides are still 900!Marland Kitchen. Add fenofibrate 160 mg poqday and recheck labs in 3 months. Stop all alcohol.. Eat a diet rich in fruits and vegetables. Stop junk food, carbs, red meat, fried foods."  Patient noticed definite improvement on the Klonopin although she continued to have trouble sleeping despite taking the medication at bedtime.  It did seem to improve her anxiety.  She has started fenofibrate as I have recommended.  She admits to drinking 2 beers a day and eating a high carbohydrate diet.  However previously, her triglycerides have barely been over 200 before this most recent lab work Past Medical History:  Diagnosis Date  . Abnormal Pap smear   . Allergic rhinitis   . BV (bacterial vaginosis)   . GERD (gastroesophageal reflux disease)   . HSV-2 (herpes simplex virus 2) infection   . Hypertension   . Rheumatoid arthritis (HCC)   . Seizures (HCC)    dx as child with epilepsy but no seizures since 89 and on no meds  . Vaginal Pap smear, abnormal    Past Surgical History:  Procedure Laterality Date  . CERVICAL  CONIZATION W/BX  12/20/2012   Procedure: CONIZATION CERVIX WITH BIOPSY;  Surgeon: Lazaro ArmsLuther H Eure, MD;  Location: [facility];  Service: Gynecology;  Laterality: N/A;  . HOLMIUM LASER APPLICATION  12/20/2012   Procedure: HOLMIUM LASER APPLICATION;  Surgeon: Lazaro ArmsLuther H Eure, MD;  Location: [facility];  Service: Gynecology;  Laterality: N/A;   Current Outpatient Prescriptions on File Prior to Visit  Medication Sig Dispense Refill  . Biotin 1000 MCG tablet Take 1,000 mcg by mouth daily.    . Cholecalciferol (VITAMIN D-3) 1000 UNITS CAPS Take 1 capsule by mouth daily.    . clonazePAM (KLONOPIN) 0.5 MG tablet Take 1 tablet (0.5 mg total) by mouth 2 (two) times daily as needed for anxiety. 20 tablet 1  . fenofibrate 160 MG tablet Take 1 tablet (160 mg total) by mouth daily. 30 tablet 3  . fluticasone (FLONASE) 50 MCG/ACT nasal spray [facility]  [facility] ONCE DAILY 16 g 11  . folic acid (FOLVITE) 1 MG tablet Take 1 mg by mouth daily.    Marland Kitchen GARLIC PO Take by mouth daily.    Marland Kitchen HUMIRA PEN 40 MG/0.8ML PNKT   0  . losartan-hydrochlorothiazide (HYZAAR) 50-12.5 MG tablet TAKE ONE TABLET BY MOUTH ONCE DAILY 30 tablet 2  . medroxyPROGESTERone (DEPO-PROVERA) 150 MG/ML injection INJECT 1 ML INTRAMUSCULARLY IN DOCTOR'S OFFICE EVERY 3 MONTHS. 1 mL 4  . methotrexate (RHEUMATREX) 2.5 MG tablet Take 6 tablets by mouth weekly  Caution:Chemotherapy. Protect from light. On Monday    . nebivolol (BYSTOLIC) 10 MG tablet Take 1 tablet (10 mg total) by mouth daily. 90 tablet 3  . Omega-3 Fatty Acids (FISH OIL) 1000 MG CAPS Take 1,000 mg by mouth daily.    Marland Kitchen omeprazole (PRILOSEC) 20 MG capsule TAKE ONE CAPSULE BY MOUTH ONCE DAILY 30 capsule 11   No current facility-administered medications on file prior to visit.    Allergies  Allergen Reactions  . Penicillins    Social History   Social History  . Marital status: Single    Spouse name: N/A  . Number of children: 2  . Years of education: N/A   Occupational History   . [employer] DESK[employer]    Social History Main Topics  . Smoking status: Current Every Day Smoker    Packs/day: 0.50    Years: 15.00    Types: Cigarettes  . Smokeless tobacco: Never Used  . Alcohol use Yes     Comment: social  . Drug use: No  . Sexual activity: Yes    Birth control/ protection: Injection     Comment: Depo Provera every 3 months   Other Topics Concern  . Not on file   Social History Narrative  . No narrative on file   Family History  Problem Relation Age of Onset  . Hypertension Mother   . Hypertension Father   . Cancer Paternal Uncle        pancreatic cancer      Review of Systems  All other systems reviewed and are negative.      Objective:   Physical Exam  Constitutional: She is oriented to person, place, and time. She appears well-developed and well-nourished. No distress.  HENT:  Head: Normocephalic and atraumatic.  Right Ear: External ear normal.  Left Ear: External ear normal.  Nose: Nose normal.  Mouth/Throat: Oropharynx is clear and moist. No oropharyngeal exudate.  Eyes: Pupils are equal, round, and reactive to light. Conjunctivae are normal. Right eye exhibits no discharge. Left eye exhibits no discharge. No scleral icterus.  Neck: Neck supple. No JVD present. No tracheal deviation present. No thyromegaly present.  Cardiovascular: Normal rate, regular rhythm and intact distal pulses.  Exam reveals no gallop and no friction rub.   No murmur heard. Pulmonary/Chest: Effort normal and breath sounds normal. No stridor. No respiratory distress. She has no wheezes. She has no rales. She exhibits no tenderness.  Abdominal: Soft. Bowel sounds are normal. She exhibits no distension and no mass. There is no tenderness. There is no rebound and no guarding.  Musculoskeletal: She exhibits no edema, tenderness or deformity.  Lymphadenopathy:    She has no cervical adenopathy.  Neurological: She is alert and oriented to  person, place, and time. She has normal reflexes. No cranial nerve deficit. She exhibits normal muscle tone. Coordination normal.  Skin: Skin is warm and dry. No rash noted. She is not diaphoretic. No  erythema. No pallor.  Psychiatric: She has a normal mood and affect. Her behavior is normal. Judgment and thought content normal.  Vitals reviewed.         Assessment & Plan:  Feeling anxious  Hypertriglyceridemia  I recommended a low carbohydrate diet rich in fruits and vegetables.  I recommended cessation of all alcohol and aerobic exercise.  Continue fenofibrate and recheck fasting lipid panel and liver function test in 3 months.  Blood pressure is acceptable.  Discontinue Klonopin and replaced with Lexapro 10 mg a day.  Reassess in 6 weeks to see if anxiety and insomnia have improved on the Lexapro.

## 2017-10-19 ENCOUNTER — Other Ambulatory Visit: Payer: Self-pay | Admitting: Family Medicine

## 2017-11-19 ENCOUNTER — Ambulatory Visit (INDEPENDENT_AMBULATORY_CARE_PROVIDER_SITE_OTHER): Payer: BLUE CROSS/BLUE SHIELD | Admitting: *Deleted

## 2017-11-19 ENCOUNTER — Encounter: Payer: Self-pay | Admitting: *Deleted

## 2017-11-19 DIAGNOSIS — Z308 Encounter for other contraceptive management: Secondary | ICD-10-CM

## 2017-11-19 DIAGNOSIS — Z3042 Encounter for surveillance of injectable contraceptive: Secondary | ICD-10-CM

## 2017-11-19 DIAGNOSIS — Z3202 Encounter for pregnancy test, result negative: Secondary | ICD-10-CM

## 2017-11-19 LAB — POCT URINE PREGNANCY: Preg Test, Ur: NEGATIVE

## 2017-11-19 MED ORDER — MEDROXYPROGESTERONE ACETATE 150 MG/ML IM SUSP
150.0000 mg | Freq: Once | INTRAMUSCULAR | Status: AC
  Administered 2017-11-19: 150 mg via INTRAMUSCULAR

## 2017-11-19 NOTE — Progress Notes (Signed)
Pt here for Depo. Pt tolerated shot well. Return in 12 weeks for next shot. JSY 

## 2017-12-28 ENCOUNTER — Ambulatory Visit: Payer: BLUE CROSS/BLUE SHIELD | Admitting: Family Medicine

## 2018-01-04 ENCOUNTER — Ambulatory Visit (INDEPENDENT_AMBULATORY_CARE_PROVIDER_SITE_OTHER): Payer: BLUE CROSS/BLUE SHIELD | Admitting: Family Medicine

## 2018-01-04 ENCOUNTER — Encounter: Payer: Self-pay | Admitting: Family Medicine

## 2018-01-04 VITALS — BP 122/80 | HR 68 | Temp 98.3°F | Resp 14 | Ht 68.0 in | Wt 170.0 lb

## 2018-01-04 DIAGNOSIS — I10 Essential (primary) hypertension: Secondary | ICD-10-CM

## 2018-01-04 DIAGNOSIS — G4710 Hypersomnia, unspecified: Secondary | ICD-10-CM

## 2018-01-04 DIAGNOSIS — E781 Pure hyperglyceridemia: Secondary | ICD-10-CM

## 2018-01-04 DIAGNOSIS — R0681 Apnea, not elsewhere classified: Secondary | ICD-10-CM | POA: Diagnosis not present

## 2018-01-04 NOTE — Progress Notes (Signed)
Subjective:    Patient ID: Samantha Rosales, female    DOB: 1981/03/31, 37 y.o.   MRN: [id number]  HPI  08/24/17 Over the last week, the patient has had several episodes where she developed pressure in her forehead. At times the pressure would be intense. At other times the pressure would be mild. However is always present. She also reports some mild dizziness and feeling "foggy headed". She also reports a tingling sensation in her chest. She denies any chest pains. She denies any chest fullness. She describes it like an anxiety type feeling or uneasiness. Her blood pressure is extremely high. I verified this myself and actually found to be higher at 170/100. She denies any angina. She denies any dyspnea on exertion. She denies any shortness of breath. I believe her symptoms are likely secondary to elevated blood pressure. She is compliant with her medication.  At that time, my plan was: Blood pressure is extremely high. I believe this is why she's having the dull headache. I recommended adding bystolic 10 mg by mouth daily to her current medication rechecking her blood pressure here next week as well as a reassessment to see if her symptoms are improving. Also check CBC as well as a CMP for any laboratory abnormalities. I believe the unusual sensation in her chest is not cardiac. I believe some of this is related to anxiety. She has a very difficult time even describing the feeling in her chest. It is not a pain. It is not a pressure. It is a tingling sensation that comes and goes. To me the sounds in the more anxiety related or possibly related to elevated blood pressure. Reassess in one week. She denies any hemoptysis. She denies any pleurisy. She denies any shortness of breath. Her exam today is completely reassuring.  08/31/17 Blood pressure is much better. Today she is approximately 120/78 when I checked her blood pressure. She's been getting similar readings at home. She denies any side effects on  the medication. The pressure in her face, the unusual sensation in her chest, the headaches have all subsided. She feels much better. She does have a cold sore on the lower right lip that has been there for 24 hours. She is requesting treatment for this.  At that time, my plan was: Continue bystolic 10 mg poqday.  Watch blood pressure closely. If blood pressure continues to drop, we may need to reduce the dose of this medication. Continue to encourage smoking cessation. Treat cold sore with bowel checks 2 g by mouth twice a day for 1 day  09/11/17 The patient has had no further episodes of chest discomfort or the unusual sensation in her chest. Furthermore her headaches have subsided. Her blood pressure remains well controlled on bystolic.  That being said, she continues to feel anxious. She continues to have difficult time sleeping. She states that her thoughts race at night. She does report some mild anxiety, at least she thinks is anxiety. She denies any depression. She denies anhedonia. She denies any suicidal ideation. She denies any family history of mental illness. She is here today for a complete physical exam. She sees a gynecologist for her Pap smear, breast exam. She is due today for repeat a CMP to monitor the mild elevation in her liver function test. She is also due for fasting lipid panel. Recent CBC was normal. I have not screen the patient for hyperthyroidism given her recent symptoms.  She is due for her flu shot. She  believes she had Pneumovax 23 at her rheumatologist's office.  AT that time, my plan was: Her blood pressure is certainly better controlled. However I question if some of her symptoms could be related to anxiety. I would like to check a TSH to rule out hyperthyroidism as a cause of her symptoms. I would also like to repeat a CMP to monitor the mild elevation in her liver function tests. I will also check a fasting lipid panel. Her goal LDL cholesterol is less than 130. She will  receive her flu shot today. She is LAD had Pneumovax 23 at her rheumatologist's office according to her. Her Pap smear and breast exam is performed at her gynecologist office. Patient agrees to try a test. I will start her on Klonopin 0.25 mg by mouth twice a day scheduled for one week to see if her symptoms improve somewhat. She will take it when she gets home from work in the afternoon and prior to bed. If her symptoms improve, he would point towards anxiety being at the root of some of her symptoms and we can discuss better long-term options and Klonopin. I explained the medication can be addictive and habit-forming if taken for a long period of time but I do not see a problem simply taking it for one week to see if symptoms improve  10/05/17 Labs revealed significantly elevated triglycerides (1786 initially and confirmed at 912).  I recommended to the patient to:  "Triglycerides are still 900!Marland Kitchen Add fenofibrate 160 mg poqday and recheck labs in 3 months. Stop all alcohol.. Eat a diet rich in fruits and vegetables. Stop junk food, carbs, red meat, fried foods."  Patient noticed definite improvement on the Klonopin although she continued to have trouble sleeping despite taking the medication at bedtime.  It did seem to improve her anxiety.  She has started fenofibrate as I have recommended.  She admits to drinking 2 beers a day and eating a high carbohydrate diet.  However previously, her triglycerides have barely been over 200 before this most recent lab work.  At that time,my plan was: I recommended a low carbohydrate diet rich in fruits and vegetables.  I recommended cessation of all alcohol and aerobic exercise.  Continue fenofibrate and recheck fasting lipid panel and liver function test in 3 months.  Blood pressure is acceptable.  Discontinue Klonopin and replaced with Lexapro 10 mg a day.  Reassess in 6 weeks to see if anxiety and insomnia have improved on the Lexapro.  01/04/18 Patient is here  today to recheck her triglycerides.  She is currently taking fenofibrate 160 mg p.o. daily.  She denies any myalgias or right upper quadrant pain.  She is tolerating the medication without difficulty.  Since I last saw her however her family has raised concerns of her obstructive sleep apnea.  Both her sister and her boyfriend have witnessed apneic episodes while she was sleeping.  She reports excessive daytime sleepiness.  She states that she easily falls asleep while riding in a car.  She can easily fall asleep after lunch.  She can easily fall asleep watching TV or reading a book.  She does not fall asleep at all during conversations or at work.  Her Epworth sleepiness score is 10 indicating a high likelihood of obstructive sleep apnea. Past Medical History:  Diagnosis Date  . Abnormal Pap smear   . Allergic rhinitis   . BV (bacterial vaginosis)   . GERD (gastroesophageal reflux disease)   . High cholesterol   .  HSV-2 (herpes simplex virus 2) infection   . Hypertension   . Rheumatoid arthritis (HCC)   . Seizures (HCC)    dx as child with epilepsy but no seizures since 89 and on no meds  . Vaginal Pap smear, abnormal    Past Surgical History:  Procedure Laterality Date  . CERVICAL CONIZATION W/BX  12/20/2012   Procedure: CONIZATION CERVIX WITH BIOPSY;  Surgeon: Lazaro Arms, MD;  Location: [facility];  Service: Gynecology;  Laterality: N/A;  . HOLMIUM LASER APPLICATION  12/20/2012   Procedure: HOLMIUM LASER APPLICATION;  Surgeon: Lazaro Arms, MD;  Location: [facility];  Service: Gynecology;  Laterality: N/A;   Current Outpatient Medications on File Prior to Visit  Medication Sig Dispense Refill  . Biotin 1000 MCG tablet Take 1,000 mcg by mouth daily.    . Cholecalciferol (VITAMIN D-3) 1000 UNITS CAPS Take 1 capsule by mouth daily.    Marland Kitchen escitalopram (LEXAPRO) 10 MG tablet Take 1 tablet (10 mg total) by mouth daily. 30 tablet 3  . fenofibrate 160 MG tablet Take 1 tablet (160 mg total) by mouth  daily. 30 tablet 3  . fluticasone (FLONASE) 50 MCG/ACT nasal spray USE TWO SPRAY(S) IN EACH NOSTRIL ONCE DAILY 16 g 11  . folic acid (FOLVITE) 1 MG tablet Take 1 mg by mouth daily.    Marland Kitchen GARLIC PO Take by mouth daily.    Marland Kitchen HUMIRA PEN 40 MG/0.8ML PNKT Every 2 weeks  0  . losartan-hydrochlorothiazide (HYZAAR) 50-12.5 MG tablet TAKE ONE TABLET BY MOUTH ONCE DAILY 30 tablet 2  . medroxyPROGESTERone (DEPO-PROVERA) 150 MG/ML injection INJECT 1 ML INTRAMUSCULARLY IN DOCTOR'S OFFICE EVERY 3 MONTHS. 1 mL 4  . methotrexate (RHEUMATREX) 2.5 MG tablet Take 6 tablets by mouth weekly  Caution:Chemotherapy. Protect from light. On Monday    . nebivolol (BYSTOLIC) 10 MG tablet Take 1 tablet (10 mg total) by mouth daily. 90 tablet 3  . Omega-3 Fatty Acids (FISH OIL) 1000 MG CAPS Take 1,000 mg by mouth daily.    Marland Kitchen omeprazole (PRILOSEC) 20 MG capsule TAKE ONE CAPSULE BY MOUTH ONCE DAILY 30 capsule 11   No current facility-administered medications on file prior to visit.    Allergies  Allergen Reactions  . Penicillins    Social History   Socioeconomic History  . Marital status: Single    Spouse name: Not on file  . Number of children: 2  . Years of education: Not on file  . Highest education level: Not on file  Social Needs  . Financial resource strain: Not on file  . Food insecurity - worry: Not on file  . Food insecurity - inability: Not on file  . Transportation needs - medical: Not on file  . Transportation needs - non-medical: Not on file  Occupational History  . 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 Smoking status: Current Every Day Smoker    Packs/day: 0.50    Years: 15.00    Pack years: 7.50    Types: Cigarettes  . Smokeless tobacco: Never Used  Substance and Sexual Activity  . Alcohol use: Yes    Comment: social  . Drug use: No  . Sexual activity: Yes    Birth control/protection: Injection    Comment: Depo Provera every 3  months  Other Topics Concern  . Not on file  Social History Narrative  . Not on file   Family History  Problem Relation Age of  Onset  . Hypertension Mother   . Hypertension Father   . Cancer Paternal Uncle        pancreatic cancer      Review of Systems  All other systems reviewed and are negative.      Objective:   Physical Exam  Constitutional: She is oriented to person, place, and time. She appears well-developed and well-nourished. No distress.  HENT:  Head: Normocephalic and atraumatic.  Right Ear: External ear normal.  Left Ear: External ear normal.  Nose: Nose normal.  Mouth/Throat: Oropharynx is clear and moist. No oropharyngeal exudate.  Eyes: Conjunctivae are normal. Pupils are equal, round, and reactive to light. Right eye exhibits no discharge. Left eye exhibits no discharge. No scleral icterus.  Neck: Neck supple. No JVD present. No tracheal deviation present. No thyromegaly present.  Cardiovascular: Normal rate, regular rhythm and intact distal pulses. Exam reveals no gallop and no friction rub.  No murmur heard. Pulmonary/Chest: Effort normal and breath sounds normal. No stridor. No respiratory distress. She has no wheezes. She has no rales. She exhibits no tenderness.  Abdominal: Soft. Bowel sounds are normal. She exhibits no distension and no mass. There is no tenderness. There is no rebound and no guarding.  Musculoskeletal: She exhibits no edema, tenderness or deformity.  Lymphadenopathy:    She has no cervical adenopathy.  Neurological: She is alert and oriented to person, place, and time. She has normal reflexes. No cranial nerve deficit. She exhibits normal muscle tone. Coordination normal.  Skin: Skin is warm and dry. No rash noted. She is not diaphoretic. No erythema. No pallor.  Psychiatric: She has a normal mood and affect. Her behavior is normal. Judgment and thought content normal.  Vitals reviewed.         Assessment & Plan:  Benign  essential HTN  Hypertriglyceridemia  Hypersomnolence  Apnea - Plan: Ambulatory referral to Sleep Studies  I am very happy with her blood pressure today.  I have requested that the patient return fasting at her convenience for a CMP and a fasting lipid panel to monitor her liver function test on the fenofibrate as well as her triglyceride levels.  I would also like to consult a sleep specialist to evaluate the patient for obstructive sleep apnea as this may contribute to her blood pressure.  If she does have sleep apnea and if it gets treated appropriately, perhaps we can discontinue some of her blood pressure medication.  Patient is interested in having the studies done.

## 2018-01-31 ENCOUNTER — Ambulatory Visit (INDEPENDENT_AMBULATORY_CARE_PROVIDER_SITE_OTHER): Payer: BLUE CROSS/BLUE SHIELD | Admitting: Neurology

## 2018-01-31 ENCOUNTER — Encounter: Payer: Self-pay | Admitting: Neurology

## 2018-01-31 VITALS — BP 136/89 | HR 83 | Ht 68.0 in | Wt 172.0 lb

## 2018-01-31 DIAGNOSIS — R51 Headache: Secondary | ICD-10-CM | POA: Diagnosis not present

## 2018-01-31 DIAGNOSIS — G4719 Other hypersomnia: Secondary | ICD-10-CM | POA: Diagnosis not present

## 2018-01-31 DIAGNOSIS — E663 Overweight: Secondary | ICD-10-CM | POA: Diagnosis not present

## 2018-01-31 DIAGNOSIS — R519 Headache, unspecified: Secondary | ICD-10-CM

## 2018-01-31 DIAGNOSIS — R351 Nocturia: Secondary | ICD-10-CM | POA: Diagnosis not present

## 2018-01-31 DIAGNOSIS — R0681 Apnea, not elsewhere classified: Secondary | ICD-10-CM | POA: Diagnosis not present

## 2018-01-31 DIAGNOSIS — M069 Rheumatoid arthritis, unspecified: Secondary | ICD-10-CM | POA: Diagnosis not present

## 2018-01-31 DIAGNOSIS — R0683 Snoring: Secondary | ICD-10-CM

## 2018-01-31 DIAGNOSIS — F17200 Nicotine dependence, unspecified, uncomplicated: Secondary | ICD-10-CM | POA: Diagnosis not present

## 2018-01-31 NOTE — Patient Instructions (Signed)

## 2018-01-31 NOTE — Progress Notes (Signed)
Subjective:    Patient ID: Samantha Rosales is a 37 y.o. female.  HPI     Huston Foley, MD, PhD [facility] [address] [address] [place], [place] [address]  Dear Dr. Tanya Nones,   I saw your patient, Samantha Rosales, upon your kind request in my neurologic clinic today for initial consultation of her sleep disorder, in particular, concern for underlying obstructive sleep apnea. The patient is unaccompanied today. As you know, Ms. [name] a 37 year old right-handed woman with an underlying medical history of allergic rhinitis, reflux disease, hypertension, rheumatoid arthritis (followed by rheum), childhood epilepsy (no longer on medication), smoking, and borderline overweight state, who reports snoring and excessive daytime somnolence, as well as witnessed apneas per boyfriend and family. I reviewed your office note from 01/04/2018. Her Epworth sleepiness score is 14 out of 24, fatigue score is 40 out of 63. She lives with her boyfriend and her 2 children. 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 Her bedtime is around 8:30 or 9, rise time around 5. Her sleep is nonrestorative, she has nocturia about once per average night, has had occasional morning headaches and a history of rare migraines. Weight has been fluctuating within about 5 pounds typically. She does not have an obvious family history of obstructive sleep apnea. She reports a two-year history of increase in daytime somnolence.  Her Past Medical History Is Significant For: Past Medical History:  Diagnosis Date  . Abnormal Pap smear   . Allergic rhinitis   . BV (bacterial vaginosis)   . GERD (gastroesophageal reflux disease)   . High cholesterol   . HSV-2 (herpes simplex virus 2) infection   . Hypertension   . Rheumatoid arthritis (HCC)   . Seizures (HCC)    dx as child  with epilepsy but no seizures since 89 and on no meds  . Vaginal Pap smear, abnormal     Her Past Surgical History Is Significant For: Past Surgical History:  Procedure Laterality Date  . CERVICAL CONIZATION W/BX  12/20/2012   Procedure: CONIZATION CERVIX WITH BIOPSY;  Surgeon: Lazaro Arms, MD;  Location: [facility];  Service: Gynecology;  Laterality: N/A;  . HOLMIUM LASER APPLICATION  12/20/2012   Procedure: HOLMIUM LASER APPLICATION;  Surgeon: Lazaro Arms, MD;  Location: [facility];  Service: Gynecology;  Laterality: N/A;    Her Family History Is Significant For: Family History  Problem Relation Age of Onset  . Hypertension Mother   . Hypertension Father   . Cancer Paternal Uncle        pancreatic cancer    Her Social History Is Significant For: Social History   Socioeconomic History  . Marital status: Single    Spouse name: None  . Number of children: 2  . Years of education: None  . Highest education level: None  Social Needs  . Financial resource strain: None  . Food insecurity - worry: None  . Food insecurity - inability: None  . Transportation needs - medical: None  . Transportation needs - non-medical: None  Occupational History  . 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 Smoking status: Current Every Day Smoker    Packs/day: 0.50    Years: 15.00    Pack years: 7.50    Types: Cigarettes  . Smokeless tobacco: Never Used  Substance and  Sexual Activity  . Alcohol use: Yes    Comment: social  . Drug use: No  . Sexual activity: Yes    Birth control/protection: Injection    Comment: Depo Provera every 3 months  Other Topics Concern  . None  Social History Narrative  . None    Her Allergies Are:  Allergies  Allergen Reactions  . Penicillins   :   Her Current Medications Are:  Outpatient Encounter Medications as of 01/31/2018  Medication Sig  . Biotin 1000 MCG tablet Take 1,000 mcg by mouth daily.  .  Cholecalciferol (VITAMIN D-3) 1000 UNITS CAPS Take 1 capsule by mouth daily.  Marland Kitchen escitalopram (LEXAPRO) 10 MG tablet Take 1 tablet (10 mg total) by mouth daily.  . fenofibrate 160 MG tablet Take 1 tablet (160 mg total) by mouth daily.  . fluticasone (FLONASE) 50 MCG/ACT nasal spray USE TWO SPRAY(S) IN EACH NOSTRIL ONCE DAILY  . folic acid (FOLVITE) 1 MG tablet Take 1 mg by mouth daily.  Marland Kitchen GARLIC PO Take by mouth daily.  Marland Kitchen HUMIRA PEN 40 MG/0.8ML PNKT Every 2 weeks  . losartan-hydrochlorothiazide (HYZAAR) 50-12.5 MG tablet TAKE ONE TABLET BY MOUTH ONCE DAILY  . medroxyPROGESTERone (DEPO-PROVERA) 150 MG/ML injection INJECT 1 ML INTRAMUSCULARLY IN DOCTOR'S OFFICE EVERY 3 MONTHS.  Marland Kitchen methotrexate (RHEUMATREX) 2.5 MG tablet Take 6 tablets by mouth weekly  Caution:Chemotherapy. Protect from light. On Monday  . nebivolol (BYSTOLIC) 10 MG tablet Take 1 tablet (10 mg total) by mouth daily.  . Omega-3 Fatty Acids (FISH OIL) 1000 MG CAPS Take 1,000 mg by mouth daily.  Marland Kitchen omeprazole (PRILOSEC) 20 MG capsule TAKE ONE CAPSULE BY MOUTH ONCE DAILY   No facility-administered encounter medications on file as of 01/31/2018.   :  Review of Systems:  Out of a complete 14 point review of systems, all are reviewed and negative with the exception of these symptoms as listed below: Review of Systems  Neurological:       Patient here to discuss her sleeping issues. She endorses snoring and daytime sleepiness.   Epworth Sleepiness Scale 0= would never doze 1= slight chance of dozing 2= moderate chance of dozing 3= high chance of dozing  Sitting and reading: 1 Watching TV:3 Sitting inactive in a public place (ex. Theater or meeting):3 As a passenger in a car for an hour without a break:3 Lying down to rest in the afternoon:3 Sitting and talking to someone:0 Sitting quietly after lunch (no alcohol):1 In a car, while stopped in traffic:0 Total:14     Objective:  Neurological Exam  Physical Exam Physical  Examination:   Vitals:   01/31/18 1531  BP: 136/89  Pulse: 83    General Examination: The patient is a very pleasant 37 y.o. female in no acute distress. She appears well-developed and well-nourished and well groomed.   HEENT: Normocephalic, atraumatic, pupils are equal, round and reactive to light and accommodation. Extraocular tracking is good without limitation to gaze excursion or nystagmus noted. Normal smooth pursuit is noted. Hearing is grossly intact. Face is symmetric with normal facial animation and normal facial sensation. Speech is clear with no dysarthria noted. There is no hypophonia. There is no lip, neck/head, jaw or voice tremor. Neck with FROM. Oropharynx exam reveals: mild mouth dryness, adequate dental hygiene and moderate airway crowding, due to smaller airway entry and redundant soft palate, tonsils in place which are about 1+ in size bilaterally, Mallampati is class II, neck circumference is 14-1/4 inches. She has a minimal  overbite.  Chest: Clear to auscultation without wheezing, rhonchi or crackles noted.  Heart: S1+S2+0, regular and normal without murmurs, rubs or gallops noted.   Abdomen: Soft, non-tender and non-distended with normal bowel sounds appreciated on auscultation.  Extremities: There is no pitting edema in the distal lower extremities bilaterally. Pedal pulses are intact.  Skin: Warm and dry without trophic changes noted.   Musculoskeletal: exam reveals mild erythema in both hands, mild arthritic changes in both hands.  Neurologically:  Mental status: The patient is awake, alert and oriented in all 4 spheres. Her immediate and remote memory, attention, language skills and fund of knowledge are appropriate. There is no evidence of aphasia, agnosia, apraxia or anomia. Speech is clear with normal prosody and enunciation. Thought process is linear. Mood is normal and affect is normal.  Cranial nerves II - XII are as described above under HEENT exam. In  addition: shoulder shrug is normal with equal shoulder height noted. Motor exam: Normal bulk, strength and tone is noted. There is no drift, tremor or rebound. Romberg is negative. Reflexes are 2+ throughout. Babinski: Toes are . Fine motor skills and coordination: intact with normal finger taps, normal hand movements, normal rapid alternating patting, normal foot taps and normal foot agility.  Cerebellar testing: No dysmetria or intention tremor on finger to nose testing. Heel to shin is unremarkable bilaterally. There is no truncal or gait ataxia.  Sensory exam: intact to light touch in the upper and lower extremities.  Gait, station and balance: She stands easily. No veering to one side is noted. No leaning to one side is noted. Posture is age-appropriate and stance is narrow based. Gait shows normal stride length and normal pace. No problems turning are noted. Tandem walk is unremarkable.   Assessment and Plan:   In summary, MCKENZEE KUEBER is a very pleasant 37 y.o.-year old female with an underlying medical history of allergic rhinitis, reflux disease, hypertension, rheumatoid arthritis (followed by rheum), childhood epilepsy (no longer on medication), smoking, and borderline overweight state, whose history and physical exam are concerning for obstructive sleep apnea (OSA). I had a long chat with the patient about my findings and the diagnosis of OSA, its prognosis and treatment options. We talked about medical treatments, surgical interventions and non-pharmacological approaches. I explained in particular the risks and ramifications of untreated moderate to severe OSA, especially with respect to developing cardiovascular disease down the Road, including congestive heart failure, difficult to treat hypertension, cardiac arrhythmias, or stroke. Even type 2 diabetes has, in part, been linked to untreated OSA. Symptoms of untreated OSA include daytime sleepiness, memory problems, mood irritability and  mood disorder such as depression and anxiety, lack of energy, as well as recurrent headaches, especially morning headaches. We talked about smoking cessation and trying to maintain a healthy lifestyle in general, as well as the importance of weight control. I encouraged the patient to eat healthy, exercise daily and keep well hydrated, to keep a scheduled bedtime and wake time routine, to not skip any meals and eat healthy snacks in between meals. I advised the patient not to drive when feeling sleepy. I recommended the following at this time: sleep study with potential positive airway pressure titration. (We will score hypopneas at 3%).   I explained the sleep test procedure to the patient and also outlined possible surgical and non-surgical treatment options of OSA, including the use of a custom-made dental device (which would require a referral to a specialist dentist or oral surgeon), upper  airway surgical options, such as pillar implants, radiofrequency surgery, tongue base surgery, and UPPP (which would involve a referral to an ENT surgeon). Rarely, jaw surgery such as mandibular advancement may be considered.  I also explained the CPAP treatment option to the patient, who indicated that she would be willing to try CPAP if the need arises. I explained the importance of being compliant with PAP treatment, not only for insurance purposes but primarily to improve Her symptoms, and for the patient's long term health benefit, including to reduce Her cardiovascular risks. I answered all her questions today and the patient was in agreement. I would like to see her back after the sleep study is completed and encouraged her to call with any interim questions, concerns, problems or updates.   Thank you very much for allowing me to participate in the care of this nice patient. If I can be of any further assistance to you please do not hesitate to call me at [phone].  Sincerely,   Huston Foley, MD,  PhD

## 2018-02-05 ENCOUNTER — Other Ambulatory Visit: Payer: Self-pay | Admitting: Family Medicine

## 2018-02-05 DIAGNOSIS — I10 Essential (primary) hypertension: Secondary | ICD-10-CM

## 2018-02-07 ENCOUNTER — Telehealth: Payer: Self-pay

## 2018-02-07 DIAGNOSIS — G4719 Other hypersomnia: Secondary | ICD-10-CM

## 2018-02-07 NOTE — Telephone Encounter (Signed)
Will wait until Dr. Teofilo Pod return to address.

## 2018-02-07 NOTE — Telephone Encounter (Signed)
[employer] denied in lab sleep study, Need HST order 

## 2018-02-11 ENCOUNTER — Encounter: Payer: Self-pay | Admitting: *Deleted

## 2018-02-11 ENCOUNTER — Ambulatory Visit (INDEPENDENT_AMBULATORY_CARE_PROVIDER_SITE_OTHER): Payer: BLUE CROSS/BLUE SHIELD | Admitting: *Deleted

## 2018-02-11 DIAGNOSIS — Z308 Encounter for other contraceptive management: Secondary | ICD-10-CM

## 2018-02-11 DIAGNOSIS — Z3042 Encounter for surveillance of injectable contraceptive: Secondary | ICD-10-CM

## 2018-02-11 DIAGNOSIS — Z3202 Encounter for pregnancy test, result negative: Secondary | ICD-10-CM

## 2018-02-11 LAB — POCT URINE PREGNANCY: Preg Test, Ur: NEGATIVE

## 2018-02-11 MED ORDER — MEDROXYPROGESTERONE ACETATE 150 MG/ML IM SUSP
150.0000 mg | Freq: Once | INTRAMUSCULAR | Status: AC
  Administered 2018-02-11: 150 mg via INTRAMUSCULAR

## 2018-02-11 NOTE — Telephone Encounter (Signed)
HST order placed. 

## 2018-02-11 NOTE — Progress Notes (Signed)
Pt here for Depo. Pt tolerated shot well. Return in 12 weeks for next shot. JSY 

## 2018-03-04 ENCOUNTER — Ambulatory Visit: Payer: BLUE CROSS/BLUE SHIELD | Admitting: Neurology

## 2018-03-04 DIAGNOSIS — G4719 Other hypersomnia: Secondary | ICD-10-CM

## 2018-03-04 DIAGNOSIS — G4710 Hypersomnia, unspecified: Secondary | ICD-10-CM | POA: Diagnosis not present

## 2018-03-04 DIAGNOSIS — R0683 Snoring: Secondary | ICD-10-CM

## 2018-03-12 ENCOUNTER — Telehealth: Payer: Self-pay

## 2018-03-12 NOTE — Telephone Encounter (Signed)
I called pt. I advised pt that Dr. Frances Furbish reviewed pt's sleep study and found that pt did not show any significant osa but some snoring was noted. Dr. Frances Furbish recommends that pt discuss an oral appliance with a qualified dentist if her snoring is disturbing. I reviewed sleep hygiene recommendations with the pt, including trying to keep a regular sleep wake schedule, avoiding electronics in the bedroom, keeping the bedroom cool, dark, and quiet, and avoiding eating or exercising within 2 hours of bedtime as well as eating in the middle of the night. I advised pt to keep pets out of the bedroom. I discussed with pt the importance of stress relief and to try meditation, deep breathing exercises, and/or a white noise machine or fan to diffuse other noise distractors. I advised pt to not drink alcohol before bedtime and to never mix alcohol and sedating medications. Pt was advised to avoid narcotic pain medication close to bedtime. I advised pt that a copy of these sleep study results will be sent to Dr. Tanya Nones. Pt verbalized understanding of results. Pt had no questions at this time but was encouraged to call back if questions arise.

## 2018-03-12 NOTE — Progress Notes (Signed)
Patient referred by Dr. Tanya Nones, seen by me on 01/31/18, HST on 03/04/18.   Please call and notify the patient that the recent home sleep test did not show any significant obstructive sleep apnea. some snoring was noted. For disturbing snoring, an oral appliance (through a qualified dentist) can be considered.  She can FU with PCP. Please remind patient to try to maintain good sleep hygiene, which means: Keep a regular sleep and wake schedule and make enough time for sleep (7 1/2 to 8 1/2 hours for the average adult), try not to exercise or have a meal within 2 hours of your bedtime, try to keep your bedroom conducive for sleep, that is, cool and dark, without light distractors such as an illuminated alarm clock, and refrain from watching TV right before sleep or in the middle of the night and do not keep the TV or radio on during the night. If a nightlight is used, have it away from the visual field. Also, try not to use or play on electronic devices at bedtime, such as your cell phone, tablet PC or laptop. If you like to read at bedtime on an electronic device, try to dim the background light as much as possible. Do not eat in the middle of the night. Keep pets away from the bedroom environment. For stress relief, try meditation, deep breathing exercises (there are many books and CDs available), a white noise machine or fan can help to diffuse other noise distractors, such as traffic noise. Do not drink alcohol before bedtime, as it can disturb sleep and cause middle of the night awakenings. Never mix alcohol and sedating medications! Avoid narcotic pain medication close to bedtime, as opioids/narcotics can suppress breathing drive and breathing effort.   Thanks,  Huston Foley, MD, PhD [facility])

## 2018-03-12 NOTE — Procedures (Signed)
  [facility] [address] [place], [place] NAME:   Samantha Rosales                                                       DOB: Apr 10, 1981 MEDICAL RECORD NUMBER [id number]                                          DOS: 03/04/18 REFERRING PHYSICIAN: 05/04/18, MD STUDY PERFORMED: Home Sleep Test HISTORY: 37 year old woman with a history of allergic rhinitis, reflux disease, hypertension, rheumatoid arthritis (followed by rheum), childhood epilepsy (no longer on medication), smoking, and borderline overweight state, who reports snoring and excessive daytime somnolence. Her Epworth sleepiness score is 14 out of 24, BMI: 26.1.  STUDY RESULTS:  Total Recording Time:    6 hours, 25 minutes Total Apnea/Hypopnea Index (AHI): 0.5 /h, RDI: 1.4/h Average Oxygen Saturation: 94%, lowest Oxygen Desaturation: 91%  Total Time Oxygen Saturation Below or at 88% was less than 1 minute  Average Heart Rate: 86 bpm  Snoring events: 359 IMPRESSION: Primary Snoring,  RECOMMENDATION: This study does not demonstrate any significant obstructive or central sleep disordered breathing. Some snoring was noted. For disturbing snoring, an oral appliance (through a qualified dentist) can be considered. This study does not support an intrinsic sleep disorder as a cause of the patient's symptoms. Other causes, including circadian rhythm disturbances, an underlying mood disorder, medication effect and/or an underlying medical problem cannot be ruled out. The patient should be cautioned not to drive, work at heights, or operate dangerous or heavy equipment when tired or sleepy. Review and reiteration of good sleep hygiene measures should be pursued with any patient. The patient can follow-up with her referring provider, who will be notified of the test results. I certify that I have reviewed the raw data recording prior to the issuance of this report in accordance with the standards of the  American Academy of Sleep Medicine (AASM). 30, MD, PhD Diplomat, ABPN (Neurology and Sleep)

## 2018-03-12 NOTE — Telephone Encounter (Signed)
-----   Message from Samantha Foley, MD sent at 03/12/2018  1:51 PM EDT ----- Patient referred by Dr. Tanya Nones, seen by me on 01/31/18, HST on 03/04/18.   Please call and notify the patient that the recent home sleep test did not show any significant obstructive sleep apnea. some snoring was noted. For disturbing snoring, an oral appliance (through a qualified dentist) can be considered.  She can FU with PCP. Please remind patient to try to maintain good sleep hygiene, which means: Keep a regular sleep and wake schedule and make enough time for sleep (7 1/2 to 8 1/2 hours for the average adult), try not to exercise or have a meal within 2 hours of your bedtime, try to keep your bedroom conducive for sleep, that is, cool and dark, without light distractors such as an illuminated alarm clock, and refrain from watching TV right before sleep or in the middle of the night and do not keep the TV or radio on during the night. If a nightlight is used, have it away from the visual field. Also, try not to use or play on electronic devices at bedtime, such as your cell phone, tablet PC or laptop. If you like to read at bedtime on an electronic device, try to dim the background light as much as possible. Do not eat in the middle of the night. Keep pets away from the bedroom environment. For stress relief, try meditation, deep breathing exercises (there are many books and CDs available), a white noise machine or fan can help to diffuse other noise distractors, such as traffic noise. Do not drink alcohol before bedtime, as it can disturb sleep and cause middle of the night awakenings. Never mix alcohol and sedating medications! Avoid narcotic pain medication close to bedtime, as opioids/narcotics can suppress breathing drive and breathing effort.   Thanks,  Samantha Foley, MD, PhD [facility])

## 2018-03-20 ENCOUNTER — Telehealth: Payer: Self-pay | Admitting: Family Medicine

## 2018-03-20 NOTE — Telephone Encounter (Signed)
PA Submitted through [email or web address] and received the following:  Your information has been submitted to [employer] [place]. [employer] [place] will review the request and fax you a determination directly, typically within 3 business days of your submission once all necessary information is received.  If [employer] [place] has not responded in 3 business days or if you have any questions about your submission, contact [employer] [place] at [phone]. 

## 2018-03-27 MED ORDER — NEBIVOLOL HCL 10 MG PO TABS: 10.0000 mg | ORAL_TABLET | Freq: Every day | ORAL | 3 refills | Status: DC

## 2018-03-27 NOTE — Telephone Encounter (Signed)
Received PA determination.   PA approved effective from 03/27/2018 through 03/25/2021.  Pharmacy made aware.

## 2018-05-03 ENCOUNTER — Other Ambulatory Visit: Payer: Self-pay | Admitting: Family Medicine

## 2018-05-06 ENCOUNTER — Encounter: Payer: Self-pay | Admitting: *Deleted

## 2018-05-06 ENCOUNTER — Ambulatory Visit: Payer: BLUE CROSS/BLUE SHIELD

## 2018-05-07 ENCOUNTER — Ambulatory Visit (INDEPENDENT_AMBULATORY_CARE_PROVIDER_SITE_OTHER): Payer: BLUE CROSS/BLUE SHIELD

## 2018-05-07 VITALS — Wt 163.0 lb

## 2018-05-07 DIAGNOSIS — Z3202 Encounter for pregnancy test, result negative: Secondary | ICD-10-CM

## 2018-05-07 DIAGNOSIS — Z3042 Encounter for surveillance of injectable contraceptive: Secondary | ICD-10-CM | POA: Diagnosis not present

## 2018-05-07 LAB — POCT URINE PREGNANCY: Preg Test, Ur: NEGATIVE

## 2018-05-07 MED ORDER — MEDROXYPROGESTERONE ACETATE 150 MG/ML IM SUSP
150.0000 mg | Freq: Once | INTRAMUSCULAR | Status: AC
  Administered 2018-05-07: 150 mg via INTRAMUSCULAR

## 2018-05-07 NOTE — Progress Notes (Signed)
Pt here for depo injection 150 mg given Lt deltoid. Tolerated well. Return 12 week for next shot. Pad CMA

## 2018-06-14 ENCOUNTER — Other Ambulatory Visit: Payer: Self-pay

## 2018-06-14 ENCOUNTER — Encounter: Payer: Self-pay | Admitting: Family Medicine

## 2018-06-14 ENCOUNTER — Emergency Department (HOSPITAL_COMMUNITY): Payer: BLUE CROSS/BLUE SHIELD

## 2018-06-14 ENCOUNTER — Ambulatory Visit: Payer: BLUE CROSS/BLUE SHIELD | Admitting: Family Medicine

## 2018-06-14 ENCOUNTER — Other Ambulatory Visit: Payer: BLUE CROSS/BLUE SHIELD

## 2018-06-14 ENCOUNTER — Encounter (HOSPITAL_COMMUNITY): Payer: Self-pay | Admitting: *Deleted

## 2018-06-14 ENCOUNTER — Emergency Department (HOSPITAL_COMMUNITY)
Admission: EM | Admit: 2018-06-14 | Discharge: 2018-06-15 | Disposition: A | Discharge status: Home / Self Care | Payer: BLUE CROSS/BLUE SHIELD | Attending: Emergency Medicine | Admitting: Emergency Medicine

## 2018-06-14 VITALS — BP 130/86 | HR 79 | Temp 98.5°F | Resp 16 | Ht 68.0 in | Wt 163.0 lb

## 2018-06-14 DIAGNOSIS — R748 Abnormal levels of other serum enzymes: Secondary | ICD-10-CM

## 2018-06-14 DIAGNOSIS — R8290 Unspecified abnormal findings in urine: Secondary | ICD-10-CM | POA: Diagnosis not present

## 2018-06-14 DIAGNOSIS — R7989 Other specified abnormal findings of blood chemistry: Secondary | ICD-10-CM | POA: Diagnosis not present

## 2018-06-14 DIAGNOSIS — K219 Gastro-esophageal reflux disease without esophagitis: Secondary | ICD-10-CM | POA: Diagnosis not present

## 2018-06-14 DIAGNOSIS — F17210 Nicotine dependence, cigarettes, uncomplicated: Secondary | ICD-10-CM | POA: Diagnosis not present

## 2018-06-14 DIAGNOSIS — I10 Essential (primary) hypertension: Secondary | ICD-10-CM | POA: Insufficient documentation

## 2018-06-14 DIAGNOSIS — E781 Pure hyperglyceridemia: Secondary | ICD-10-CM

## 2018-06-14 DIAGNOSIS — Z79899 Other long term (current) drug therapy: Secondary | ICD-10-CM

## 2018-06-14 DIAGNOSIS — R112 Nausea with vomiting, unspecified: Secondary | ICD-10-CM | POA: Diagnosis not present

## 2018-06-14 DIAGNOSIS — E876 Hypokalemia: Secondary | ICD-10-CM

## 2018-06-14 LAB — CBC WITH DIFFERENTIAL/PLATELET
Basophils Absolute: 9 cells/uL (ref 0–200)
Basophils Absolute: 0 10*3/uL (ref 0.0–0.1)
Basophils Relative: 0.2 %
Basophils Relative: 0 %
Eosinophils Absolute: 40 cells/uL (ref 15–500)
Eosinophils Absolute: 0.1 10*3/uL (ref 0.0–0.7)
Eosinophils Relative: 0.9 %
Eosinophils Relative: 1 %
HCT: 40.5 % (ref 35.0–45.0)
HCT: 38.3 % (ref 36.0–46.0)
Hemoglobin: 14.1 g/dL (ref 11.7–15.5)
Hemoglobin: 13 g/dL (ref 12.0–15.0)
Lymphocytes Relative: 41 %
Lymphs Abs: 1663 cells/uL (ref 850–3900)
Lymphs Abs: 2.9 10*3/uL (ref 0.7–4.0)
MCH: 33.7 pg — ABNORMAL HIGH (ref 27.0–33.0)
MCH: 33.4 pg (ref 26.0–34.0)
MCHC: 34.8 g/dL (ref 32.0–36.0)
MCHC: 33.9 g/dL (ref 30.0–36.0)
MCV: 96.9 fL (ref 80.0–100.0)
MCV: 98.5 fL (ref 78.0–100.0)
MPV: 11.6 fL (ref 7.5–12.5)
Monocytes Absolute: 0.5 10*3/uL (ref 0.1–1.0)
Monocytes Relative: 12.2 %
Monocytes Relative: 7 %
Neutro Abs: 2152 cells/uL (ref 1500–7800)
Neutro Abs: 3.5 10*3/uL (ref 1.7–7.7)
Neutrophils Relative %: 48.9 %
Neutrophils Relative %: 51 %
Platelets: 182 10*3/uL (ref 140–400)
Platelets: 190 10*3/uL (ref 150–400)
RBC: 4.18 10*6/uL (ref 3.80–5.10)
RBC: 3.89 MIL/uL (ref 3.87–5.11)
RDW: 12.4 % (ref 11.0–15.0)
RDW: 13 % (ref 11.5–15.5)
Total Lymphocyte: 37.8 %
WBC mixed population: 537 cells/uL (ref 200–950)
WBC: 4.4 10*3/uL (ref 3.8–10.8)
WBC: 7 10*3/uL (ref 4.0–10.5)

## 2018-06-14 LAB — URINALYSIS, ROUTINE W REFLEX MICROSCOPIC
Bilirubin Urine: NEGATIVE
Bilirubin Urine: NEGATIVE
Glucose, UA: NEGATIVE
Glucose, UA: NEGATIVE mg/dL
Hgb urine dipstick: NEGATIVE
Hgb urine dipstick: NEGATIVE
Ketones, ur: 5 mg/dL — AB
Leukocytes, UA: NEGATIVE
Nitrite: POSITIVE — AB
Nitrite: POSITIVE — AB
Protein, ur: 30 mg/dL — AB
RBC / HPF: NONE SEEN /HPF (ref 0–2)
Specific Gravity, Urine: 1.015 (ref 1.001–1.03)
Specific Gravity, Urine: 1.021 (ref 1.005–1.030)
pH: 7 (ref 5.0–8.0)
pH: 5 (ref 5.0–8.0)

## 2018-06-14 LAB — COMPREHENSIVE METABOLIC PANEL
ALT: 711 U/L — ABNORMAL HIGH (ref 0–44)
AST: 915 U/L — ABNORMAL HIGH (ref 15–41)
Albumin: 4.2 g/dL (ref 3.5–5.0)
Alkaline Phosphatase: 126 U/L (ref 38–126)
Anion gap: 12 (ref 5–15)
BUN: 15 mg/dL (ref 6–20)
CO2: 24 mmol/L (ref 22–32)
Calcium: 9.2 mg/dL (ref 8.9–10.3)
Chloride: 99 mmol/L (ref 98–111)
Creatinine, Ser: 1.38 mg/dL — ABNORMAL HIGH (ref 0.44–1.00)
GFR calc Af Amer: 56 mL/min — ABNORMAL LOW (ref >60)
GFR calc non Af Amer: 48 mL/min — ABNORMAL LOW (ref >60)
Glucose, Bld: 121 mg/dL — ABNORMAL HIGH (ref 70–99)
Potassium: 2.9 mmol/L — ABNORMAL LOW (ref 3.5–5.1)
Sodium: 135 mmol/L (ref 135–145)
Total Bilirubin: 2.5 mg/dL — ABNORMAL HIGH (ref 0.3–1.2)
Total Protein: 7.7 g/dL (ref 6.5–8.1)

## 2018-06-14 LAB — COMPLETE METABOLIC PANEL WITH GFR
AG Ratio: 1.3 (calc) (ref 1.0–2.5)
ALT: 883 U/L — ABNORMAL HIGH (ref 6–29)
AST: 1409 U/L — ABNORMAL HIGH (ref 10–30)
Albumin: 4.4 g/dL (ref 3.6–5.1)
Alkaline phosphatase (APISO): 135 U/L — ABNORMAL HIGH (ref 33–115)
BUN/Creatinine Ratio: 9 (calc) (ref 6–22)
BUN: 10 mg/dL (ref 7–25)
CO2: 28 mmol/L (ref 20–32)
Calcium: 9.5 mg/dL (ref 8.6–10.2)
Chloride: 95 mmol/L — ABNORMAL LOW (ref 98–110)
Creat: 1.13 mg/dL — ABNORMAL HIGH (ref 0.50–1.10)
GFR, Est African American: 72 mL/min/{1.73_m2} (ref >=60)
GFR, Est Non African American: 62 mL/min/{1.73_m2} (ref >=60)
Globulin: 3.4 g/dL (calc) (ref 1.9–3.7)
Glucose, Bld: 116 mg/dL — ABNORMAL HIGH (ref 65–99)
Potassium: 3.3 mmol/L — ABNORMAL LOW (ref 3.5–5.3)
Sodium: 133 mmol/L — ABNORMAL LOW (ref 135–146)
Total Bilirubin: 3.2 mg/dL — ABNORMAL HIGH (ref 0.2–1.2)
Total Protein: 7.8 g/dL (ref 6.1–8.1)

## 2018-06-14 LAB — LIPID PANEL
Cholesterol: 123 mg/dL (ref <200)
HDL: 22 mg/dL — ABNORMAL LOW (ref >50)
Non-HDL Cholesterol (Calc): 101 mg/dL (calc) (ref <130)
Total CHOL/HDL Ratio: 5.6 (calc) — ABNORMAL HIGH (ref <5.0)
Triglycerides: 563 mg/dL — ABNORMAL HIGH (ref <150)

## 2018-06-14 LAB — MICROSCOPIC MESSAGE

## 2018-06-14 LAB — I-STAT BETA HCG BLOOD, ED (MC, WL, AP ONLY): I-stat hCG, quantitative: <5 m[IU]/mL (ref <5)

## 2018-06-14 LAB — LIPASE: Lipase: 182 U/L — ABNORMAL HIGH (ref 7–60)

## 2018-06-14 LAB — PREGNANCY, URINE: Preg Test, Ur: NEGATIVE

## 2018-06-14 IMAGING — US US ABDOMEN LIMITED
1 series · 14 of 25 positions shown · non-contrast
Comparison: None.

CLINICAL DATA: Vomiting with nausea

EXAM:
ULTRASOUND ABDOMEN LIMITED RIGHT UPPER QUADRANT

[Series 1: us abdomen limited · 14 of 41 slices shown]
[im 1/41]
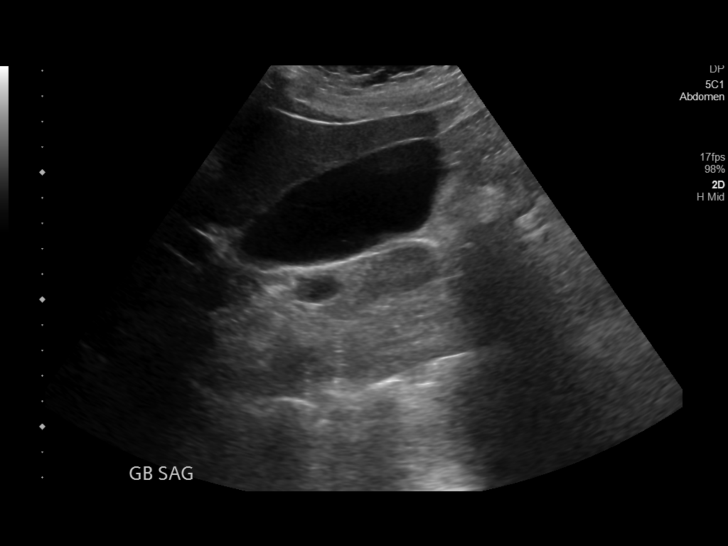
[im 4/41]
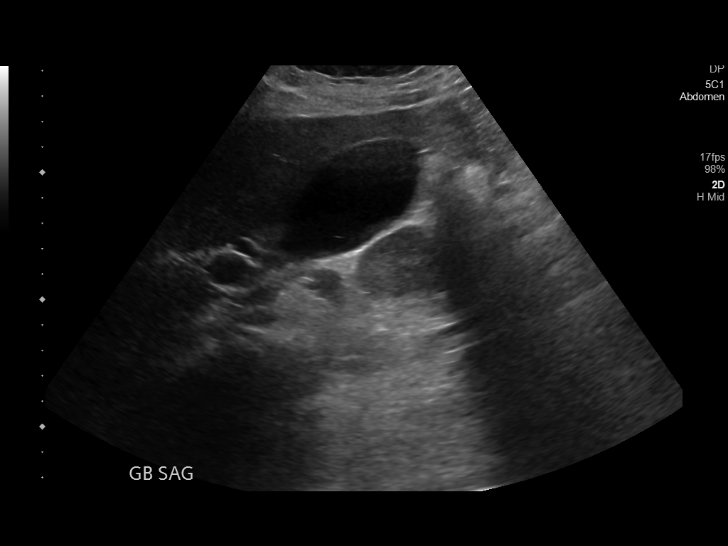
[im 7/41]
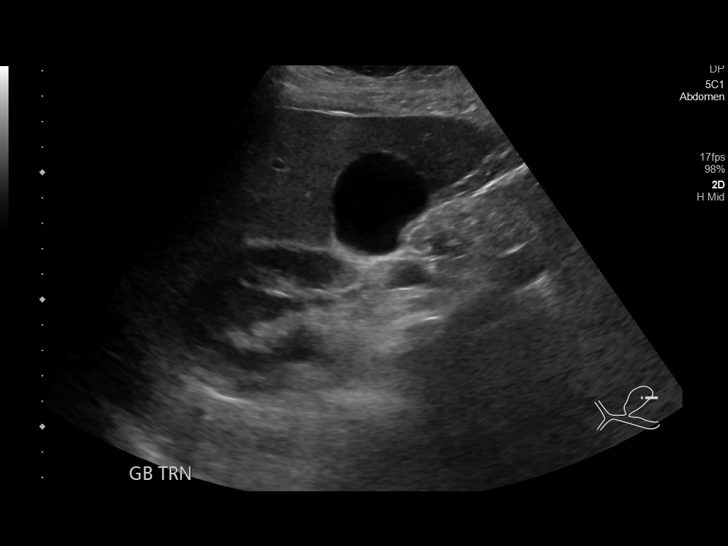
[im 11/41]
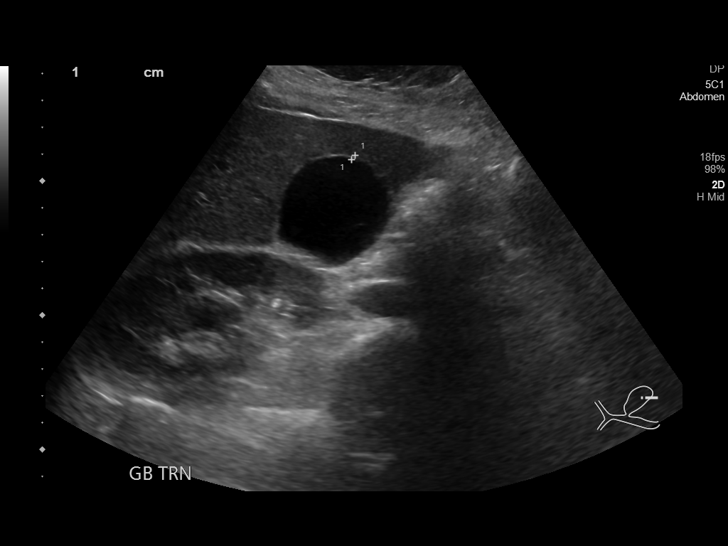
[im 14/41]
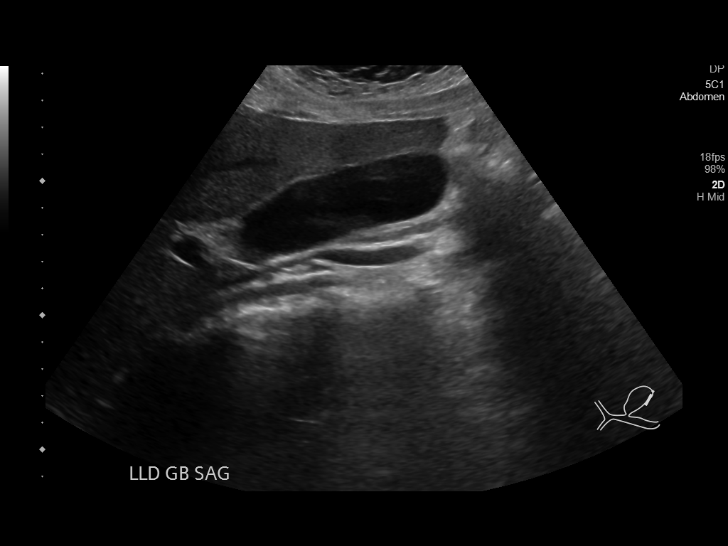
[im 16/41]
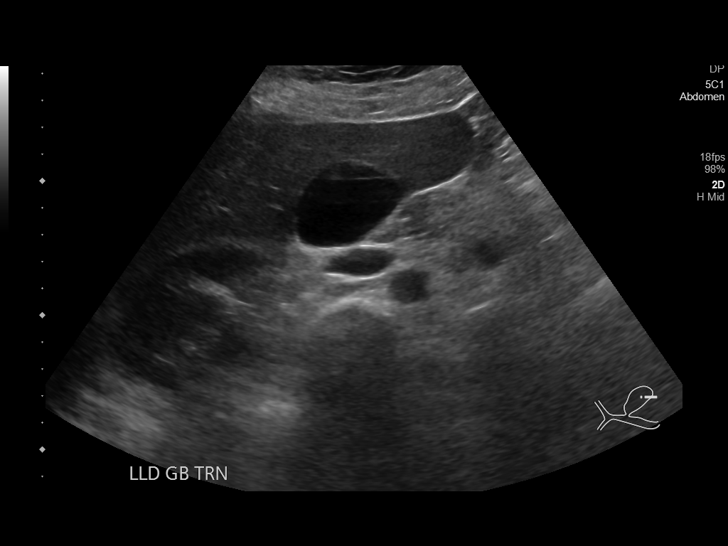
[im 19/41]
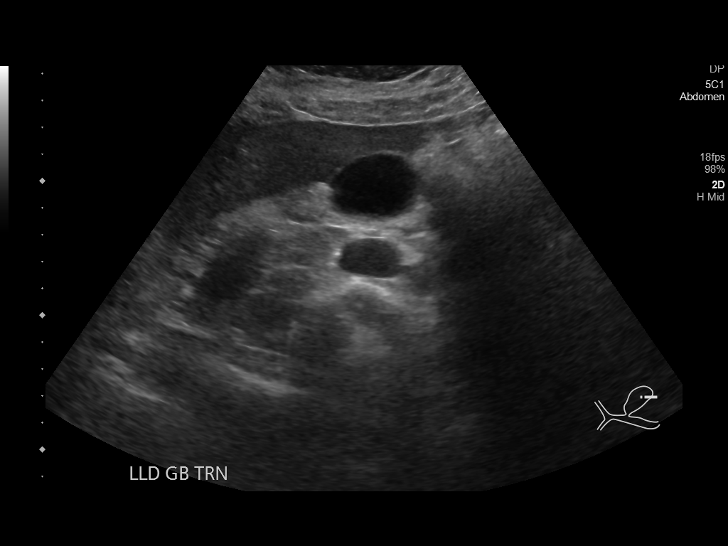
[im 22/41]
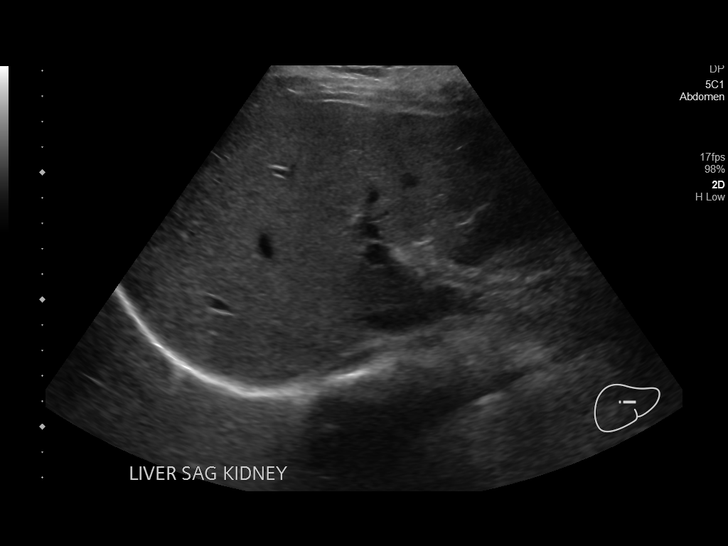
[im 26/41]
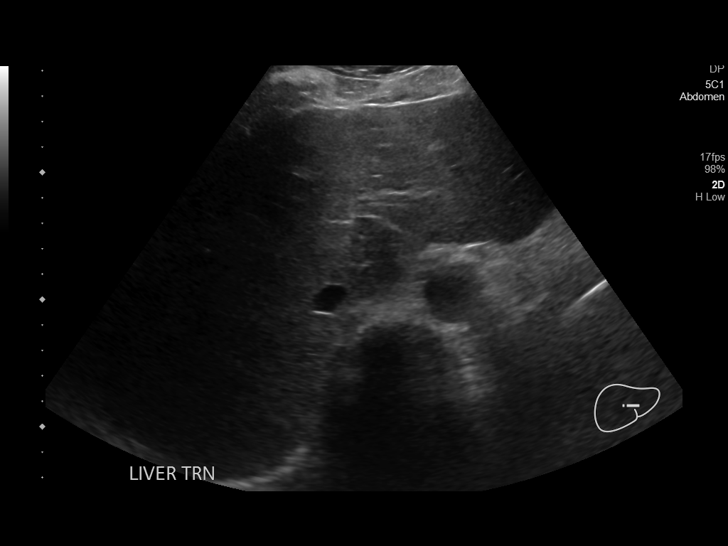
[im 27/41]
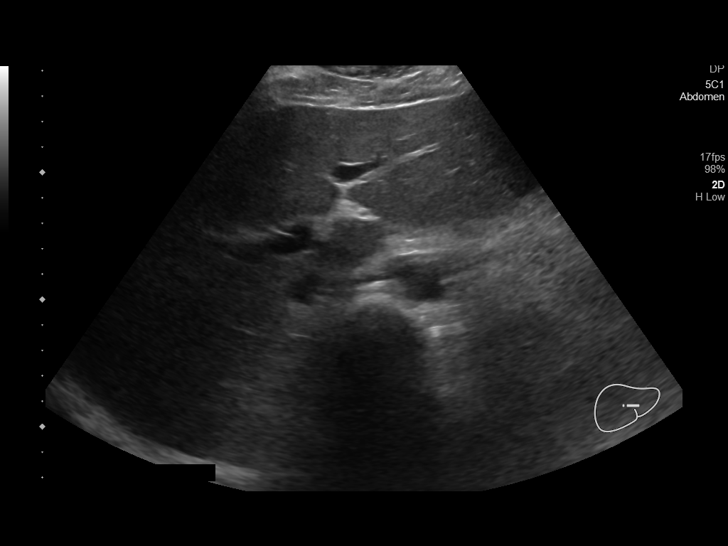
[im 31/41]
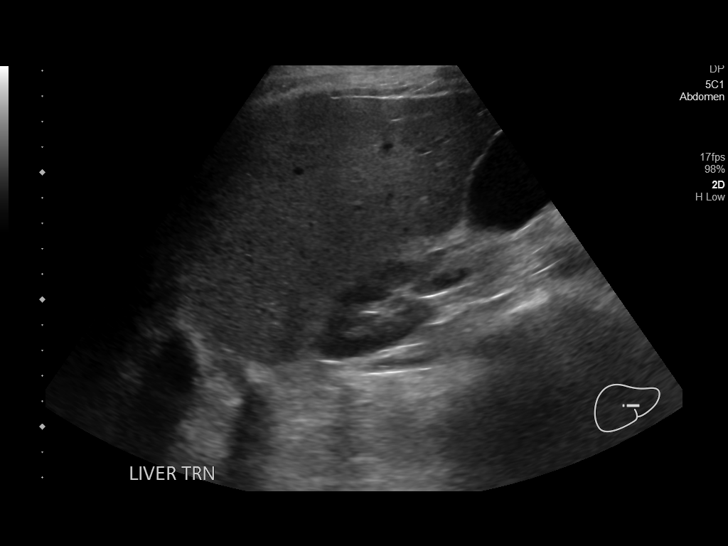
[im 34/41]
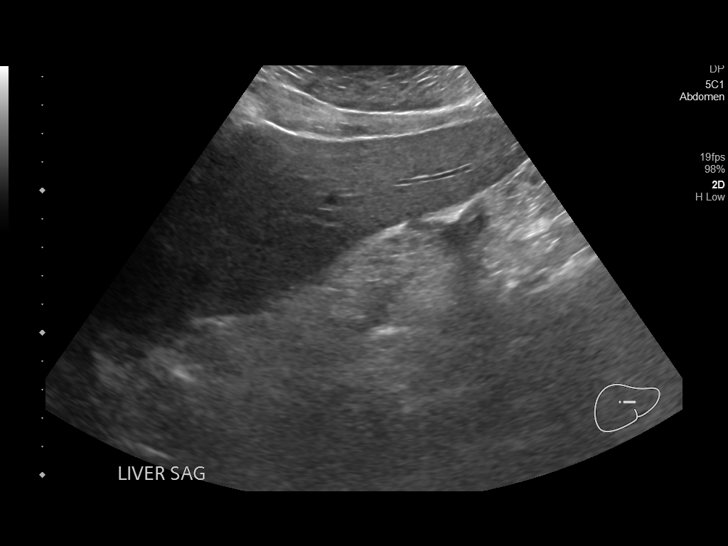
[im 37/41]
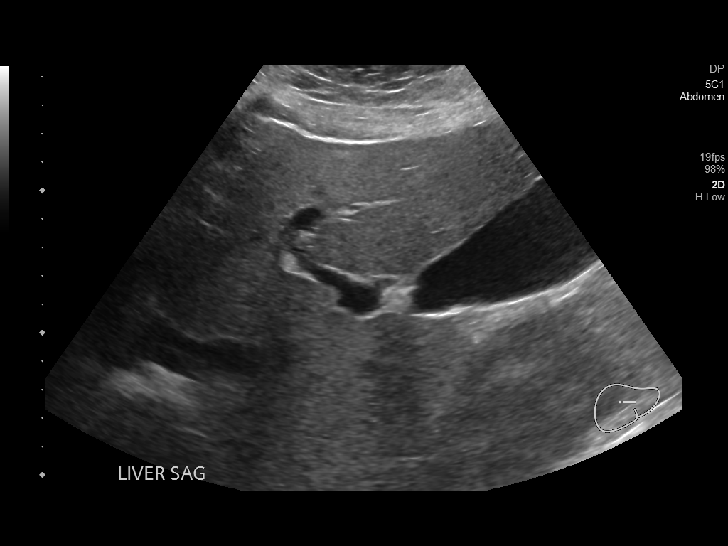
[im 41/41]
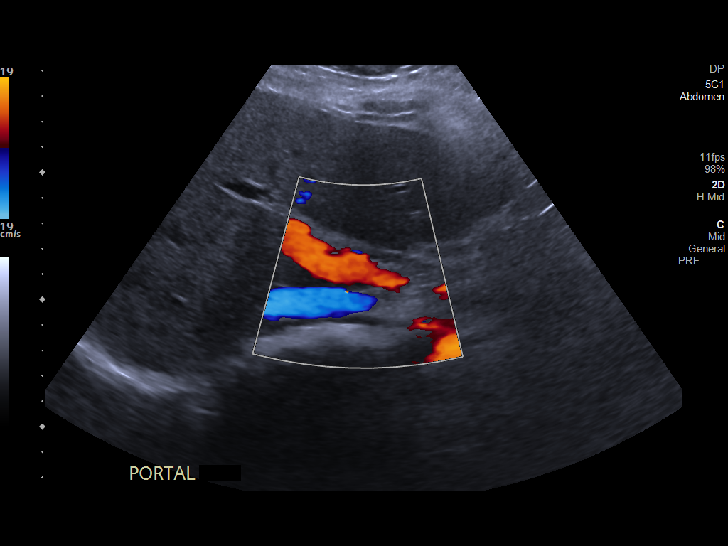

[14 of 25 positions shown; findings below may reference images not displayed]

FINDINGS: Gallbladder:

No gallstones or wall thickening visualized. No sonographic Murphy
sign noted by sonographer.

Common bile duct:

Diameter: 5 mm

Liver:

No focal lesion identified. Within normal limits in parenchymal
echogenicity. Portal vein is patent on color Doppler imaging with
normal direction of blood flow towards the liver.
IMPRESSION: Normal right upper quadrant abdominal ultrasound exam.

## 2018-06-14 MED ORDER — ONDANSETRON 4 MG PO TBDP: 4.0000 mg | ORAL_TABLET | Freq: Three times a day (TID) | ORAL | 0 refills | Status: DC | PRN

## 2018-06-14 MED ORDER — PANTOPRAZOLE SODIUM 20 MG PO TBEC: 20.0000 mg | DELAYED_RELEASE_TABLET | Freq: Every day | ORAL | 1 refills | Status: DC

## 2018-06-14 MED ORDER — CEPHALEXIN 500 MG PO CAPS: 500.0000 mg | ORAL_CAPSULE | Freq: Four times a day (QID) | ORAL | 0 refills | Status: AC

## 2018-06-14 NOTE — Progress Notes (Signed)
I called and notified pt of labs, went to ER for work up/imaging

## 2018-06-14 NOTE — ED Triage Notes (Signed)
Pt was seen by her PCP today for nausea and vomiting that she had earlier in the week; pt sts she had blood work done and they called her back and told her to come to ER immediately due to abnormal lab results. Pt was at work and was not able to come in earlier.

## 2018-06-14 NOTE — Progress Notes (Addendum)
Patient ID: Samantha Rosales, female    DOB: 10-Sep-1981, 37 y.o.   MRN: [id number]  PCP: [name], MD  Chief Complaint  Patient presents with  . Nausea    symptoms for 1 week   . Emesis  . miagraines  . Hot Flashes    Subjective:   Samantha Rosales is a 37 y.o. female, presents to clinic with CC of intermittent nausea 2 weeks with worsening for the past 5 days with 3 days vomiting in the morning until the afternoon yesterday.  Between waking and midafternoon she will vomit roughly 5x, emesis NBNB.  She will drink water and vomit it back up.  Some vomiting associated with acid reflux and indigestion, gassiness that when she burps it sometimes makes her gag and vomit.  She has been able to keep down some foods like soup in the evening.  She denies diarrhea, constipation, fever, sweats, hot or cold chills, flank pain, dysuria, hematuria, vaginal symptoms, sick contacts.  She did have one instance where she had RUQ pain briefly for 15 seconds that was was sharp and moderate but it never recurred.  She states that she has no pain at all with attempting to eat to just make her slightly more nauseous.  She is sexually active but is on Depo-Provera shot, compliant with maintenance, last was given in June with negative pregnancy test beforehand.  She does have a history of migraines and 5 days ago had a migraine that lasted for 1 to 2 days with some associated nausea but it improved and then vomiting followed the next day.  She had a small normal formed BM this morning, no melena, hematochezia. Currently pt has 0/10 pain, has only sipped water this am, and no vomiting episodes yet today.  Does have a history of RA and is on methotrexate and Humira.  Also has a history of hypertension and hypertriglyceridemia, labs that I can find per chart review showed blood work from 09/21/2017 with mildly elevated LFTs, triglycerides on 09/11/18 were 1786 and repeated 09/21/17 were 912.  Treated with  fenofibrate 160 mg in October 2018.   Patient Active Problem List   Diagnosis Date Noted  . History of abnormal cervical Pap smear 06/26/2017  . Encounter for surveillance of injectable contraceptive 06/26/2017  . Encounter for gynecological examination with Papanicolaou smear of cervix 06/26/2017  . Arthritis, rheumatic, acute or subacute 11/18/2013  . Urticaria, idiopathic 08/24/2013  . HSV-2 (herpes simplex virus 2) infection 05/29/2013  . HPV (human papilloma virus) infection 05/29/2013     Prior to Admission medications   Medication Sig Start Date End Date Taking? Authorizing Provider  Biotin 1000 MCG tablet Take 1,000 mcg by mouth daily.   Yes [provider]  Cholecalciferol (VITAMIN D-3) 1000 UNITS CAPS Take 1 capsule by mouth daily.   Yes [provider]  escitalopram (LEXAPRO) 10 MG tablet TAKE 1 TABLET BY MOUTH ONCE DAILY 05/03/18  Yes [name], MD  fenofibrate 160 MG tablet TAKE 1 TABLET BY MOUTH DAILY 02/05/18  Yes [name], MD  fluticasone [facility]) 50 MCG/ACT nasal spray USE TWO SPRAY(S) IN EACH NOSTRIL ONCE DAILY 06/21/17  Yes [name], MD  folic acid (FOLVITE) 1 MG tablet Take 1 mg by mouth daily.   Yes [provider]  GARLIC PO Take by mouth daily.   Yes [provider]  HUMIRA PEN 40 MG/0.8ML PNKT Every 2 weeks 08/08/17  Yes [provider]  losartan-hydrochlorothiazide (HYZAAR) 50-12.5 MG tablet TAKE 1 TABLET BY MOUTH ONCE DAILY 02/05/18  Yes [name], MD  medroxyPROGESTERone (DEPO-PROVERA) 150 MG/ML injection INJECT 1 ML INTRAMUSCULARLY IN DOCTOR'S OFFICE EVERY 3 MONTHS. 06/26/17  Yes Derrek Monaco A, NP  methotrexate (RHEUMATREX) 2.5 MG tablet Take 6 tablets by mouth weekly  Caution:Chemotherapy. Protect from light. On Monday   Yes [provider]  nebivolol (BYSTOLIC) 10 MG tablet Take 1 tablet (10 mg total) by mouth daily. 03/27/18  Yes [name], MD  Omega-3 Fatty Acids  (FISH OIL) 1000 MG CAPS Take 1,000 mg by mouth daily.   Yes [provider]  omeprazole (PRILOSEC) 20 MG capsule TAKE ONE CAPSULE BY MOUTH ONCE DAILY 10/19/17  Yes [name], MD     Allergies  Allergen Reactions  . Penicillins      Family History  Problem Relation Age of Onset  . Hypertension Mother   . Hypertension Father   . Cancer Paternal Uncle        pancreatic cancer     Social History   Socioeconomic History  . Marital status: Single    Spouse name: Not on file  . Number of children: 2  . Years of education: Not on file  . Highest education level: Not on file  Occupational History  . Occupation: FRONT DESK[employer]    Employer: [employer]  . Financial resource strain: Not on file  . Food insecurity:    Worry: Not on file    Inability: Not on file  . Transportation needs:    Medical: Not on file    Non-medical: Not on file  Tobacco Use  . Smoking status: Current Every Day Smoker    Packs/day: 0.50    Years: 15.00    Pack years: 7.50    Types: Cigarettes  . Smokeless tobacco: Never Used  Substance and Sexual Activity  . Alcohol use: Yes    Comment: social  . Drug use: No  . Sexual activity: Yes    Birth control/protection: Injection    Comment: Depo Provera every 3 months  Lifestyle  . Physical activity:    Days per week: Not on file    Minutes per session: Not on file  . Stress: Not on file  Relationships  . Social connections:    Talks on phone: Not on file    Gets together: Not on file    Attends religious service: Not on file    Active member of club or organization: Not on file    Attends meetings of clubs or organizations: Not on file    Relationship status: Not on file  . Intimate partner violence:    Fear of current or ex partner: Not on file    Emotionally abused: Not on file    Physically abused: Not on file    Forced sexual activity: Not on file  Other Topics Concern  . Not on  file  Social History Narrative  . Not on file     Review of Systems  Constitutional: Negative.  Negative for activity change, diaphoresis, fatigue, fever and unexpected weight change.  HENT: Negative.   Eyes: Negative.   Respiratory: Negative.   Cardiovascular: Negative.   Endocrine: Negative.   Genitourinary: Negative.  Negative for decreased urine volume, difficulty urinating, dyspareunia, dysuria, enuresis, flank pain, frequency, genital sores, hematuria, pelvic pain, urgency, vaginal bleeding, vaginal discharge and vaginal pain.  Musculoskeletal: Negative.  Negative for arthralgias and  myalgias.  Skin: Negative.  Negative for rash.  Allergic/Immunologic: Positive for immunocompromised state.  Neurological: Negative.  Negative for dizziness, tremors, syncope, weakness and light-headedness.  Hematological: Negative.  Negative for adenopathy.  All other systems reviewed and are negative.      Objective:    Vitals:   06/14/18 0824  BP: 130/86  Pulse: 79  Resp: 16  Temp: 98.5 F (36.9 C)  TempSrc: Oral  SpO2: 100%  Weight: 163 lb (73.9 kg)  Height: 5' 8"  (1.727 m)      Physical Exam  Constitutional: She is oriented to person, place, and time. She appears well-developed and well-nourished.  Non-toxic appearance. No distress.  HENT:  Head: Normocephalic and atraumatic.  Right Ear: External ear normal.  Left Ear: External ear normal.  Nose: Nose normal.  Mouth/Throat: Uvula is midline, oropharynx is clear and moist and mucous membranes are normal. No oropharyngeal exudate.  Eyes: Pupils are equal, round, and reactive to light. Conjunctivae, EOM and lids are normal. Right eye exhibits no discharge. Left eye exhibits no discharge. No scleral icterus.  Neck: Normal range of motion and phonation normal. Neck supple. No tracheal deviation present.  Cardiovascular: Normal rate, regular rhythm, normal heart sounds and normal pulses. Exam reveals no gallop and no friction rub.    No murmur heard. Pulses:      Radial pulses are 2+ on the right side, and 2+ on the left side.       Posterior tibial pulses are 2+ on the right side, and 2+ on the left side.  Pulmonary/Chest: Effort normal and breath sounds normal. No stridor. No respiratory distress. She has no wheezes. She has no rhonchi. She has no rales. She exhibits no tenderness.  Abdominal: Soft. Normal appearance and bowel sounds are normal. She exhibits no distension, no abdominal bruit, no ascites and no mass. There is no hepatosplenomegaly. There is no tenderness. There is no rigidity, no rebound, no guarding, no CVA tenderness, no tenderness at McBurney's point and negative Murphy's sign.  No CVA tenderness  Musculoskeletal: Normal range of motion. She exhibits no edema or deformity.  Lymphadenopathy:    She has no cervical adenopathy.  Neurological: She is alert and oriented to person, place, and time. She exhibits normal muscle tone. Gait normal.  Skin: Skin is warm, dry and intact. Capillary refill takes less than 2 seconds. No rash noted. She is not diaphoretic. No pallor.  Psychiatric: She has a normal mood and affect. Her speech is normal and behavior is normal.  Nursing note and vitals reviewed.   Results for orders placed or performed in visit on 06/14/18  Urinalysis, Routine w reflex microscopic  Result Value Ref Range   Color, Urine YELLOW YELLOW   APPearance CLOUDY (A) CLEAR   Specific Gravity, Urine 1.015 1.001 - 1.03   pH 7.0 5.0 - 8.0   Glucose, UA NEGATIVE NEGATIVE   Bilirubin Urine NEGATIVE NEGATIVE   Ketones, ur TRACE (A) NEGATIVE   Hgb urine dipstick NEGATIVE NEGATIVE   Protein, ur 2+ (A) NEGATIVE   Nitrite POSITIVE (A) NEGATIVE   Leukocytes, UA NEGATIVE NEGATIVE   WBC, UA 0-5 0 - 5 /HPF   RBC / HPF NONE SEEN 0 - 2 /HPF   Squamous Epithelial / LPF 0-5 < OR = 5 /HPF   Bacteria, UA FEW (A) NONE SEEN /HPF   Hyaline Cast 1-3 (A) NONE SEEN /LPF  Pregnancy, urine  Result Value Ref Range    Preg Test, Ur NEGATIVE NEGATIVE  Microscopic Message  Result Value Ref Range   Note           Assessment & Plan:   1. Nausea and vomiting, intractability of vomiting not specified, unspecified vomiting type DDx includes GI virus, pancreatitis, secondary to GERD, r/o morning sickness with neg preg. - Urinalysis, Routine w reflex microscopic -  - Pregnancy, urine - NEG - Lipid Panel - CBC with Differential - COMPLETE METABOLIC PANEL WITH GFR - Lipase - Urine Culture  2. Hypertriglyceridemia - Lipid Panel - COMPLETE METABOLIC PANEL WITH GFR  3. Immunocompromised state due to drug therapy Hx of due to methotrexate and humira, lower threshold to tx with abx for abnormal UA with + nitrites, add urine culture, obtain labs  4. Abnormal urinalysis UA + urine culture.  Starting tx with keflex 500 mg QID x 7 day.  No hx of recurrent UTI or pyelo.    5. Gastroesophageal reflux disease, esophagitis presence not specified Some of vomiting episodes triggered by reflux, indigestion prompting gagging and emesis.  Will switch from omeprazole to Protonix, reviewed proper way to take PPI, reviewed foods to avoid with GERD, stop eating several hours before bedtime, elevating head of bed.     Pt is a 37 y/o female with N x 2 weeks and vomiting x 3 days, hx of RA, immunocompromise, hypertriglyceridemia, GERD.  No sick contacts, no associated abdominal pain, no urinary symptoms.   UA with + nitrites, will culture and start for possible UTI as cause of N/V.   She has no focal abdominal pain, makes cholecystitis, pancreatitis, appendicitis or colitis less likely, and no concern for acute abdomen, no imaging indicated.  No past abd surgeries  Sx worse in the morning and improve in afternoon and evening.  Obtain point-of-care urine pregnancy rule out any morning sickness secondary to pregnancy, although this was unlikely given Depo-Provera shot.  Will treat adjust acid reflux to see if this will  help improve sx. Also recheck lipid panel and lipase, with hypertriglyceridemia, could be prone to pancreatitis, although lack of abd pain makes this less likely, and triglycerides were improving with fenofibrate treatment.  GI virus also possible.  Tx with zofran, fluid replacement.  Basic labs.  Pt had other concerns of not sleeping well which is not a new problem.  Also hot flashes, may fenofibrate side effect, or history of intermittent hot flashes has been ongoing for several months and has had no change with past 2 weeks of nausea and vomiting.    Will route these routine concerns to PCP to advise, and did discuss this with patient to allow me to eval and tx the acute issue, and then can later address chronic problems or possible medication SE with another visit or with communicating with PCP.  Delsa Grana, PA-C 06/14/18 8:45 AM   3:15 PM Patient's labs resulted as critical with elevated lipase to 182, alk phos elevated to 135, AST 1409, ALT 83, and elevated bilirubin.  Consult to Dr. Dennard Schaumann regarding patient's presentation, current medications, and new critical lab results he agrees the patient to go to the ER.  Patient was contacted at 2:42 PM today instructed to go directly to the ER for further evaluation of pancreatitis and elevated T's, she will need at least right upper quadrant ultrasound to rule out choledocholithiasis.  Was instructed to stop taking her fenofibrate.  The stated that she was going to go to [employer] [place] but she did not want to leave work until later today around 6  PM.  I reiterated the critical nature of her lab work and need for further work-up and stated that she should leave work immediately to go to the ER for stat work-up.  Delayed work-up could include his morbidity or mortality, condition, patient verbalized understanding of this.  Report called to Zacarias Pontes, ER, RN Katie @ 3:21 PM

## 2018-06-14 NOTE — ED Provider Notes (Signed)
[facility] DEPT Provider Note   CSN: [id number] Arrival date & time: 06/14/18  1925     History   Chief Complaint Chief Complaint  Patient presents with  . Abnormal Lab    HPI Samantha Rosales is a 37 y.o. female with past medical history of GERD, hyperlipidemia, hypertension, rheumatoid arthritis who presents for evaluation of abnormal labs.  Patient reports that she went to the PCP today for evaluation of intermittent nausea and vomiting that she has been having over the last week.  States that she had blood work drawn at her PCPs office.  They called her later this evening and instructed her to come to the ED for further evaluation because of increase in her LFTs.  Patient reports that her nausea and vomiting had improved.  She only went to primary care doctor because she had kept the appointment.  She states that she was not ever having any abdominal pain at that time.  Patient states that she never had any chest pain or difficulty breathing.  She does report that she smokes 1 to 2 cigarettes a day.  She does report frequent alcohol use.  She states that she will have something to drink almost every other day.  She states that sometimes she will have a glass of wine and sometimes 2 beers.  She states that she has had elevations in her LFTs previously followed by cerumen.  Patient denies any fever, chest pain, difficulty breathing, abdominal pain, nausea/vomiting, numbness/weakness of her arms or legs.  The history is provided by the patient.    Past Medical History:  Diagnosis Date  . Abnormal Pap smear   . Allergic rhinitis   . BV (bacterial vaginosis)   . GERD (gastroesophageal reflux disease)   . High cholesterol   . HSV-2 (herpes simplex virus 2) infection   . Hypertension   . Rheumatoid arthritis ([place])   . Seizures ([place])    dx as child with epilepsy but no seizures since 89 and on no meds  . Vaginal Pap smear, abnormal     Patient Active  Problem List   Diagnosis Date Noted  . History of abnormal cervical Pap smear 06/26/2017  . Encounter for surveillance of injectable contraceptive 06/26/2017  . Encounter for gynecological examination with Papanicolaou smear of cervix 06/26/2017  . Arthritis, rheumatic, acute or subacute 11/18/2013  . Urticaria, idiopathic 08/24/2013  . HSV-2 (herpes simplex virus 2) infection 05/29/2013  . HPV (human papilloma virus) infection 05/29/2013    Past Surgical History:  Procedure Laterality Date  . CERVICAL CONIZATION W/BX  12/20/2012   Procedure: CONIZATION CERVIX WITH BIOPSY;  Surgeon: Florian Buff, MD;  Location: [facility];  Service: Gynecology;  Laterality: N/A;  . HOLMIUM LASER APPLICATION  1/32/4401   Procedure: HOLMIUM LASER APPLICATION;  Surgeon: Florian Buff, MD;  Location: [facility];  Service: Gynecology;  Laterality: N/A;     OB History    Gravida  4   Para  2   Term  2   Preterm      AB  2   Living  2     SAB  1   TAB  1   Ectopic      Multiple      Live Births  2            Home Medications    Prior to Admission medications   Medication Sig Start Date End Date Taking? Authorizing Provider  Biotin 1000  MCG tablet Take 1,000 mcg by mouth daily.   Yes [provider]  Cholecalciferol (VITAMIN D-3) 1000 UNITS CAPS Take 1 capsule by mouth daily.   Yes [provider]  escitalopram (LEXAPRO) 10 MG tablet TAKE 1 TABLET BY MOUTH ONCE DAILY 05/03/18  Yes [name], MD  fenofibrate 160 MG tablet TAKE 1 TABLET BY MOUTH DAILY 02/05/18  Yes [name], MD  fluticasone [facility]) 50 MCG/ACT nasal spray USE TWO SPRAY(S) IN EACH NOSTRIL ONCE DAILY 06/21/17  Yes [name], MD  folic acid (FOLVITE) 1 MG tablet Take 1 mg by mouth daily.   Yes [provider]  HUMIRA PEN 40 MG/0.8ML PNKT Every 2 weeks 08/08/17  Yes [provider]  losartan-hydrochlorothiazide (HYZAAR) 50-12.5 MG tablet TAKE 1 TABLET BY MOUTH ONCE DAILY  02/05/18  Yes [name], MD  medroxyPROGESTERone (DEPO-PROVERA) 150 MG/ML injection INJECT 1 ML INTRAMUSCULARLY IN DOCTOR'S OFFICE EVERY 3 MONTHS. 06/26/17  Yes Derrek Monaco A, NP  methotrexate (RHEUMATREX) 2.5 MG tablet Take 6 tablets by mouth weekly  Caution:Chemotherapy. Protect from light. On Monday   Yes [provider]  nebivolol (BYSTOLIC) 10 MG tablet Take 1 tablet (10 mg total) by mouth daily. 03/27/18  Yes [name], MD  Omega-3 Fatty Acids (FISH OIL) 1000 MG CAPS Take 1,000 mg by mouth daily.   Yes [provider]  ondansetron (ZOFRAN ODT) 4 MG disintegrating tablet Take 1 tablet (4 mg total) by mouth every 8 (eight) hours as needed for nausea or vomiting. 06/14/18  Yes Delsa Grana, PA-C  pantoprazole (PROTONIX) 20 MG tablet Take 1 tablet (20 mg total) by mouth daily. 06/14/18  Yes Delsa Grana, PA-C  cephALEXin (KEFLEX) 500 MG capsule Take 1 capsule (500 mg total) by mouth 4 (four) times daily for 7 days. 06/14/18 06/21/18  Delsa Grana, PA-C    Family History Family History  Problem Relation Age of Onset  . Hypertension Mother   . Hypertension Father   . Cancer Paternal Uncle        pancreatic cancer    Social History Social History   Tobacco Use  . Smoking status: Current Every Day Smoker    Packs/day: 0.50    Years: 15.00    Pack years: 7.50    Types: Cigarettes  . Smokeless tobacco: Never Used  Substance Use Topics  . Alcohol use: Yes    Comment: social  . Drug use: No     Allergies   Penicillins   Review of Systems Review of Systems  Constitutional: Negative for chills and fever.  Eyes: Negative for visual disturbance.  Respiratory: Negative for shortness of breath.   Cardiovascular: Negative for chest pain.  Gastrointestinal: Positive for nausea (Resolved) and vomiting (resolved). Negative for abdominal pain and diarrhea.  Genitourinary: Negative for dysuria and hematuria.  Musculoskeletal: Negative for back pain.    Neurological: Negative for dizziness, weakness, numbness and headaches.  All other systems reviewed and are negative.    Physical Exam Updated Vital Signs BP 127/75 (BP Location: Right Arm)   Pulse 88   Temp 98.6 F (37 C) (Oral)   Resp 18   SpO2 100%   Physical Exam  Constitutional: She is oriented to person, place, and time. She appears well-developed and well-nourished.  HENT:  Head: Normocephalic and atraumatic.  Mouth/Throat: Oropharynx is clear and moist and mucous membranes are normal.  Eyes: Pupils are equal, round, and reactive to light. Conjunctivae, EOM and lids are normal.  Neck: Full passive range of motion without pain.  Cardiovascular: Normal rate, regular rhythm, normal heart sounds and normal pulses. Exam reveals no gallop and no friction rub.  No murmur heard. Pulmonary/Chest: Effort normal and breath sounds normal.  Lungs clear to auscultation bilaterally.  Symmetric chest rise.  No wheezing, rales, rhonchi.  Abdominal: Soft. Normal appearance. There is no tenderness. There is no rigidity, no guarding, no CVA tenderness, no tenderness at McBurney's point and negative Murphy's sign.  Abdomen is soft, non-distended, non-tender. No rigidity, No guarding. No peritoneal signs.  No CVA tenderness bilaterally.  Musculoskeletal: Normal range of motion.  Neurological: She is alert and oriented to person, place, and time.  Skin: Skin is warm and dry. Capillary refill takes less than 2 seconds.  Psychiatric: She has a normal mood and affect. Her speech is normal.  Nursing note and vitals reviewed.    ED Treatments / Results  Labs (all labs ordered are listed, but only abnormal results are displayed) Labs Reviewed  COMPREHENSIVE METABOLIC PANEL - Abnormal; Notable for the following components:      Result Value   Potassium 2.9 (*)    Glucose, Bld 121 (*)    Creatinine, Ser 1.38 (*)    AST 915 (*)    ALT 711 (*)    Total Bilirubin 2.5 (*)    GFR calc non Af Amer  48 (*)    GFR calc Af Amer 56 (*)    All other components within normal limits  URINALYSIS, ROUTINE W REFLEX MICROSCOPIC - Abnormal; Notable for the following components:   Color, Urine AMBER (*)    APPearance HAZY (*)    Ketones, ur 5 (*)    Protein, ur 30 (*)    Nitrite POSITIVE (*)    Leukocytes, UA MODERATE (*)    Bacteria, UA RARE (*)    All other components within normal limits  LIPASE, BLOOD - Abnormal; Notable for the following components:   Lipase 131 (*)    All other components within normal limits  CBC WITH DIFFERENTIAL/PLATELET  HEPATITIS PANEL, ACUTE  PROTIME-INR  I-STAT BETA HCG BLOOD, ED ([facility], [facility], [facility] ONLY)    EKG None  Radiology US Abdomen Limited Ruq  Result Date: 06/14/2018 CLINICAL DATA:  Vomiting with nausea EXAM: ULTRASOUND ABDOMEN LIMITED RIGHT UPPER QUADRANT COMPARISON:  None. FINDINGS: Gallbladder: No gallstones or wall thickening visualized. No sonographic Murphy sign noted by sonographer. Common bile duct: Diameter: 5 mm Liver: No focal lesion identified. Within normal limits in parenchymal echogenicity. Portal vein is patent on color Doppler imaging with normal direction of blood flow towards the liver. IMPRESSION: Normal right upper quadrant abdominal ultrasound exam. Electronically Signed   By: Ashley Royalty M.D.   On: 06/14/2018 23:41    Procedures Procedures (including critical care time)  Medications Ordered in ED Medications  sodium chloride 0.9 % bolus 1,000 mL (1,000 mLs Intravenous New Bag/Given 06/15/18 0054)  potassium chloride 10 mEq in 100 mL IVPB (10 mEq Intravenous New Bag/Given 06/15/18 0054)     Initial Impression / Assessment and Plan / ED Course  I have reviewed the triage vital signs and the nursing notes.  Pertinent labs & imaging results that were available during my care of the patient were reviewed by me and considered in my medical decision making (see chart for details).  Clinical Course as of Jun 15 101  Fri Jun 14, 2018    2359 US Abdomen Limited RUQ [LL]    Clinical Course User  Index [LL] Volanda Napoleon, PA-C    37 year old female past medical history of RA, GERD, hypertension who presents for evaluation of abnormal labs.  Went to PCP for evaluation was called today and told that her LFTs were abnormal. Patient is afebrile, non-toxic appearing, sitting comfortably on examination table. Vital signs reviewed and stable.  No abnormalities on exam.  We will plan to check basic labs.  Review of laboratory sent to your primary care office shows lipase of 182.  CMP showed potassium 3.3.  Total bili of 3.2.  Alk phos of 135.  AST of 1,409, ALT of 883.  Her lab work from October 2018 showed normal alk phos, normal bili, AST of 40 and ALT of 51.   Work-up today shows CMP with potassium of 2.9.  BUN is 15 and creatinine is 1.38.  AST of 915 and ALT is 711.  Alk phos is 126.  Total bili is 2.5.  Overall this is a decrease from earlier today. Lipase is 131, which is decreased from earlier. I-STAT beta negative.  CBC without any significant leukocytosis or anemia.  UA is positive for nitrates, leukocytes, pyuria.  There is squamous epithelium present.  Review of records show that patient had a urine culture sent by primary care doctor.  She was started on antibiotics for treatment of presumed UTI.   Will give fluids and IV potassium replacement for further evaluation.  Discussed patient with Dr. Silverio Decamp ([facility]). Would like a PT/INR checked. If stable and patient remains asymptomatic, she can be discharged home with plans to follow-up with outpatient GI.         Patient signed out to Charlann Lange, PA-C with PT/INR pending.  If normal, patient can be discharged home with plans to follow-up with outpatient GI.  Discussed plan with patient.  She is agreeable.  No pain or nausea/vomiting at this time.                                Final Clinical Impressions(s) / ED Diagnoses   Final diagnoses:  Nausea and vomiting   Elevated liver enzymes    ED Discharge Orders    None       Desma Mcgregor 06/15/18 0112    Charlesetta Shanks, MD 06/19/18 1607

## 2018-06-14 NOTE — Patient Instructions (Addendum)
Start taking zofran for nausea, keep pushing clear fluids with salts and some sugar.  Eat simple to digest foods after tolerating liquids for 12-24 hours +.    We will call you with labs and with urine culture results.  For now start the antibiotic.  Switch from omeprazole to protonix (prescription acid reflux medicine) to help better control reflux, indigestion and can help with nausea and vomiting in the morning.    Need to take protonix either at night without eating >2 hours before hand, or first thing in the morning on and empty stomach and do not eat for 1 hour.

## 2018-06-15 LAB — PROTIME-INR
INR: 1.06
Prothrombin Time: 13.7 seconds (ref 11.4–15.2)

## 2018-06-15 LAB — LIPASE, BLOOD: Lipase: 131 U/L — ABNORMAL HIGH (ref 11–51)

## 2018-06-15 MED ORDER — POTASSIUM CHLORIDE CRYS ER 20 MEQ PO TBCR: 40.0000 meq | EXTENDED_RELEASE_TABLET | Freq: Two times a day (BID) | ORAL | 0 refills | Status: DC

## 2018-06-15 MED ORDER — POTASSIUM CHLORIDE 10 MEQ/100ML IV SOLN
10.0000 meq | Freq: Once | INTRAVENOUS | Status: AC
  Administered 2018-06-15: 10 meq via INTRAVENOUS
  Filled 2018-06-15: qty 100

## 2018-06-15 MED ORDER — SODIUM CHLORIDE 0.9 % IV BOLUS
1000.0000 mL | Freq: Once | INTRAVENOUS | Status: AC
  Administered 2018-06-15: 1000 mL via INTRAVENOUS

## 2018-06-15 NOTE — ED Provider Notes (Signed)
Patient signed out at end of shift by Graciella Freer, PA-C, pending final lab results. She is here as recommended by her doctor for abnormal labs done by her PCP today. She previously had 1 week N, V. She is completely asymptomatic now. He labs showed significantly elevated LFT's and low K+.   As recommended by GI consultation, an PT - INR added to labs performed tonight. Plan - if normal she can go home with outpatient GI follow up.    PT/INR are WNL. She continues to be asymptomatic. She will be discharged home per plan of previous treatment team.    Elpidio Anis, PA-C 06/15/18 0202    Mancel Bale, MD 06/16/18 [id number]

## 2018-06-15 NOTE — Discharge Instructions (Addendum)
Continue taking the antibiotic given by your primary care doctor for a urinary tract infection.   As we discussed today, your liver enzymes were elevated. They seem to be decreasing since this morning. You will need to have follow up with the GI doctor for further evaluation. Please call [facility] doctor on Monday to arrange for an appointment.   Additionally, we sent a hepatitis panel. This will not come back for a few days. You will be notified of the results.   Return the emergency department for any abdominal pain, nausea/vomiting, inability to eat or drink anything, fevers, any other worsening or concerning symptoms.

## 2018-06-16 LAB — URINE CULTURE
MICRO NUMBER:: 90828349
SPECIMEN QUALITY:: ADEQUATE

## 2018-06-17 NOTE — Progress Notes (Signed)
Please notify pt of urine culture results -   Had positive urine culture with so suspect some of her N/V was from a UTI.  She should continue and complete the keflex medicine prescribed.    She did go to the ER for critical labs, thankfully her imaging was negative and repeated labs showed improving, but still abnormal labs.  Please encourage her to avoid alcohol (for her elevated lipase and pancreatitis), drink ample clear fluids and she needs a f/up appointment with Dr. Tanya Nones to recheck her in 1-2 weeks.  If she has any continued N/V or any worsening she should come in to be seen sooner.

## 2018-06-18 LAB — HEPATITIS PANEL, ACUTE
HCV Ab: <0.1 s/co ratio (ref 0.0–0.9)
Hep A IgM: NEGATIVE
Hep B C IgM: NEGATIVE
Hepatitis B Surface Ag: NEGATIVE

## 2018-06-19 ENCOUNTER — Other Ambulatory Visit: Payer: Self-pay | Admitting: Family Medicine

## 2018-06-19 ENCOUNTER — Telehealth: Payer: Self-pay | Admitting: Family Medicine

## 2018-06-19 MED ORDER — FLUCONAZOLE 150 MG PO TABS: 150.0000 mg | ORAL_TABLET | Freq: Once | ORAL | 0 refills | Status: AC

## 2018-06-19 NOTE — Progress Notes (Signed)
Sent in diflucan for yeast vaginitis sx secondary to abx treatment.  Pt to be notified it was sent to requested pharmacy.    Also pt to be reminded to follow up with her PCP, Dr. Tanya Nones in the next week for recheck after ER and with abnormal labs.  Can come sooner with any worsening N/V symptoms.    Danelle Berry, PA-C 1:07 PM 06/19/18

## 2018-06-19 NOTE — Telephone Encounter (Signed)
Patient states she was seen last week was put on an antibiotic and would like something called in for a yeast infection from the medication. She uses [facility] on [address]  CB# [phone]

## 2018-06-19 NOTE — Telephone Encounter (Signed)
Please let pt know:  Sent in diflucan for yeast vaginitis sx secondary to abx treatment, sent to requested pharmacy.    Also pt to be reminded to follow up with her PCP, Dr. Tanya Nones in the next week for recheck after ER and with abnormal labs.  Can come sooner with any worsening N/V symptoms.    Danelle Berry, PA-C 1:07 PM 06/19/18

## 2018-06-19 NOTE — Telephone Encounter (Signed)
Spoke with patient and informed her medication was sent to requested pharmacy. Patient verbalized understanding. Has appointment with Dr. Tanya Nones scheduled.

## 2018-06-24 ENCOUNTER — Other Ambulatory Visit: Payer: Self-pay | Admitting: Family Medicine

## 2018-06-24 DIAGNOSIS — I10 Essential (primary) hypertension: Secondary | ICD-10-CM

## 2018-06-25 ENCOUNTER — Other Ambulatory Visit: Payer: Self-pay | Admitting: Family Medicine

## 2018-06-25 MED ORDER — LOSARTAN POTASSIUM-HCTZ 100-25 MG PO TABS: 0.5000 | ORAL_TABLET | Freq: Every day | ORAL | 3 refills | Status: DC

## 2018-07-01 ENCOUNTER — Encounter: Payer: Self-pay | Admitting: Adult Health

## 2018-07-01 ENCOUNTER — Ambulatory Visit (INDEPENDENT_AMBULATORY_CARE_PROVIDER_SITE_OTHER): Payer: BLUE CROSS/BLUE SHIELD | Admitting: Adult Health

## 2018-07-01 ENCOUNTER — Other Ambulatory Visit: Payer: Self-pay | Admitting: Adult Health

## 2018-07-01 VITALS — BP 140/90 | HR 78 | Ht 68.0 in | Wt 172.0 lb

## 2018-07-01 DIAGNOSIS — R748 Abnormal levels of other serum enzymes: Secondary | ICD-10-CM | POA: Insufficient documentation

## 2018-07-01 DIAGNOSIS — Z01419 Encounter for gynecological examination (general) (routine) without abnormal findings: Secondary | ICD-10-CM | POA: Diagnosis not present

## 2018-07-01 DIAGNOSIS — I10 Essential (primary) hypertension: Secondary | ICD-10-CM | POA: Insufficient documentation

## 2018-07-01 DIAGNOSIS — Z3042 Encounter for surveillance of injectable contraceptive: Secondary | ICD-10-CM

## 2018-07-01 NOTE — Progress Notes (Signed)
Patient ID: Samantha Rosales, female   DOB: 24-Sep-1981, 37 y.o.   MRN: [id number] History of Present Illness: Samantha Rosales is a 37 year old black female in for well woman gyn exam, she had normal pap with negative HPV 06/26/17.She had elevated liver enzymes 7/12 and negative hepatitis panel, she has appt with Dr Richardson Landry tomorrow at 4 pm.  PCP is Dr Tanya Nones.   Current Medications, Allergies, Past Medical History, Past Surgical History, Family History and Social History were reviewed in [employer] record.     Review of Systems: Patient denies any headaches, hearing loss, fatigue, blurred vision, shortness of breath, chest pain, abdominal pain, problems with bowel movements, urination, or intercourse. No joint pain or mood swings. She is happy with her Depo.   Physical Exam:BP 140/90 (BP Location: Left Arm, Cuff Size: Normal)   Pulse 78   Ht 5\' 8"  (1.727 m)   Wt 172 lb (78 kg)   BMI 26.15 kg/m  General:  Well developed, well nourished, no acute distress Skin:  Warm and dry Neck:  Midline trachea, normal thyroid, good ROM, no lymphadenopathy Lungs; Clear to auscultation bilaterally Breast:  No dominant palpable mass, retraction, or nipple discharge Cardiovascular: Regular rate and rhythm Abdomen:  Soft, non tender, no hepatosplenomegaly Pelvic:  External genitalia is normal in appearance, no lesions.  The vagina is normal in appearance. Urethra has no lesions or masses. The cervix is bulbous.  Uterus is felt to be normal size, shape, and contour.  No adnexal masses or tenderness noted.Bladder is non tender, no masses felt. Extremities/musculoskeletal:  No swelling or varicosities noted, no clubbing or cyanosis Psych:  No mood changes, alert and cooperative,seems happy PHQ 9 score 2.  Impression:  1. Encounter for well woman exam with routine gynecological exam   2. Encounter for surveillance of injectable contraceptive   3. Essential hypertension   4. Elevated liver  enzymes      Plan: Physical in 1 year Pap in 2021 F/U with Dr 2022 tomorrow at 4 pm about elevated liver tests and BP  Next depo 8/27 at 4 pm

## 2018-07-02 ENCOUNTER — Encounter: Payer: Self-pay | Admitting: Family Medicine

## 2018-07-02 ENCOUNTER — Ambulatory Visit: Payer: BLUE CROSS/BLUE SHIELD | Admitting: Family Medicine

## 2018-07-02 VITALS — BP 136/88 | HR 82 | Temp 98.5°F | Resp 16 | Ht 68.0 in | Wt 170.0 lb

## 2018-07-02 DIAGNOSIS — R748 Abnormal levels of other serum enzymes: Secondary | ICD-10-CM

## 2018-07-02 NOTE — Progress Notes (Signed)
Subjective:    Patient ID: Samantha Rosales, female    DOB: 07-18-1981, 37 y.o.   MRN: [id number]  HPI Patient has a history of mild elevations in her liver function test as evidenced on her blood work in October of last year.  Recently was found to have significant elevations in her liver function test with an AST greater than 1000 and an ALT greater than 800.  There was also mild elevations in her alkaline phosphatase, her bilirubin was greater than 3 and she had a mildly elevated lipase.  She was referred to the emergency room where right upper quadrant ultrasound was negative for any evidence of bile duct obstruction.  Viral hepatitis serologies were negative.  She was instructed to follow-up with her PCP and is here today for follow-up.  I had already recommended that the patient discontinue fenofibrate immediately.  Of note she is also taking methotrexate for rheumatoid arthritis.  She has been on this medication for years.  She denies any excessive alcohol use.  She states that she may drink a glass of wine occasionally.  She denies any other hepatotoxic medication use such as Tylenol. Past Medical History:  Diagnosis Date  . Abnormal Pap smear   . Allergic rhinitis   . BV (bacterial vaginosis)   . GERD (gastroesophageal reflux disease)   . High cholesterol   . HSV-2 (herpes simplex virus 2) infection   . Hypertension   . Rheumatoid arthritis (HCC)   . Seizures (HCC)    dx as child with epilepsy but no seizures since 89 and on no meds  . Vaginal Pap smear, abnormal    Past Surgical History:  Procedure Laterality Date  . CERVICAL CONIZATION W/BX  12/20/2012   Procedure: CONIZATION CERVIX WITH BIOPSY;  Surgeon: Lazaro Arms, MD;  Location: [facility];  Service: Gynecology;  Laterality: N/A;  . HOLMIUM LASER APPLICATION  12/20/2012   Procedure: HOLMIUM LASER APPLICATION;  Surgeon: Lazaro Arms, MD;  Location: [facility];  Service: Gynecology;  Laterality: N/A;   Current Outpatient Medications  on File Prior to Visit  Medication Sig Dispense Refill  . Biotin 1000 MCG tablet Take 1,000 mcg by mouth daily.    . Cholecalciferol (VITAMIN D-3) 1000 UNITS CAPS Take 1 capsule by mouth daily.    Marland Kitchen escitalopram (LEXAPRO) 10 MG tablet TAKE 1 TABLET BY MOUTH ONCE DAILY 30 tablet 3  . fluticasone (FLONASE) 50 MCG/ACT nasal spray USE TWO SPRAY(S) IN EACH NOSTRIL ONCE DAILY 16 g 11  . folic acid (FOLVITE) 1 MG tablet Take 1 mg by mouth daily.    Marland Kitchen HUMIRA PEN 40 MG/0.8ML PNKT Every 2 weeks  0  . losartan-hydrochlorothiazide (HYZAAR) 50-12.5 MG tablet TAKE 1 TABLET BY MOUTH ONCE DAILY 90 tablet 3  . medroxyPROGESTERone (DEPO-PROVERA) 150 MG/ML injection INJECT 1 ML INTRAMUSCULARLY IN DOCTOR'S OFFICE EVERY 3 MONTHS. 1 mL 4  . methotrexate (RHEUMATREX) 2.5 MG tablet Take 4 tablets by mouth weekly  Caution:Chemotherapy. Protect from light. On Monday    . nebivolol (BYSTOLIC) 10 MG tablet Take 1 tablet (10 mg total) by mouth daily. 90 tablet 3  . Omega-3 Fatty Acids (FISH OIL) 1000 MG CAPS Take 1,000 mg by mouth daily.    . pantoprazole (PROTONIX) 20 MG tablet Take 1 tablet (20 mg total) by mouth daily. 30 tablet 1  . fenofibrate 160 MG tablet TAKE 1 TABLET BY MOUTH DAILY (Patient not taking: Reported on 07/01/2018) 30 tablet 3   No current facility-administered  medications on file prior to visit.    Allergies  Allergen Reactions  . Penicillins     Has patient had a PCN reaction causing immediate rash, facial/tongue/throat swelling, SOB or lightheadedness with hypotension: Unknown Has patient had a PCN reaction causing severe rash involving mucus membranes or skin necrosis: Unknown Has patient had a PCN reaction that required hospitalization: Unknown Has patient had a PCN reaction occurring within the last 10 years: No If all of the above answers are "NO", then may proceed with Cephalosporin use.    Social History   Socioeconomic History  . Marital status: Single    Spouse name: Not on file    . Number of children: 2  . Years of education: Not on file  . Highest education level: Not on file  Occupational History  . 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 Financial resource strain: Not on file  . Food insecurity:    Worry: Not on file    Inability: Not on file  . Transportation needs:    Medical: Not on file    Non-medical: Not on file  Tobacco Use  . Smoking status: Current Every Day Smoker    Packs/day: 0.50    Years: 15.00    Pack years: 7.50    Types: Cigarettes  . Smokeless tobacco: Never Used  Substance and Sexual Activity  . Alcohol use: Yes    Comment: social  . Drug use: No  . Sexual activity: Yes    Birth control/protection: Injection    Comment: Depo Provera every 3 months  Lifestyle  . Physical activity:    Days per week: Not on file    Minutes per session: Not on file  . Stress: Not on file  Relationships  . Social connections:    Talks on phone: Not on file    Gets together: Not on file    Attends religious service: Not on file    Active member of club or organization: Not on file    Attends meetings of clubs or organizations: Not on file    Relationship status: Not on file  . Intimate partner violence:    Fear of current or ex partner: Not on file    Emotionally abused: Not on file    Physically abused: Not on file    Forced sexual activity: Not on file  Other Topics Concern  . Not on file  Social History Narrative  . Not on file   Family History  Problem Relation Age of Onset  . Hypertension Mother   . Hypertension Father   . Cancer Paternal Uncle        pancreatic cancer      Review of Systems  All other systems reviewed and are negative.      Objective:   Physical Exam  Constitutional: She is oriented to person, place, and time. She appears well-developed and well-nourished. No distress.  HENT:  Head: Normocephalic and atraumatic.  Right Ear: External ear normal.   Left Ear: External ear normal.  Nose: Nose normal.  Mouth/Throat: Oropharynx is clear and moist. No oropharyngeal exudate.  Eyes: Pupils are equal, round, and reactive to light. Conjunctivae are normal. Right eye exhibits no discharge. Left eye exhibits no discharge. No scleral icterus.  Neck: Neck supple. No JVD present. No tracheal deviation present. No thyromegaly present.  Cardiovascular: Normal rate, regular rhythm and intact distal pulses. Exam reveals no gallop and no friction rub.  No murmur heard. Pulmonary/Chest: Effort normal and breath sounds normal. No stridor. No respiratory distress. She has no wheezes. She has no rales. She exhibits no tenderness.  Abdominal: Soft. Bowel sounds are normal. She exhibits no distension and no mass. There is no tenderness. There is no rebound and no guarding.  Musculoskeletal: She exhibits no edema, tenderness or deformity.  Lymphadenopathy:    She has no cervical adenopathy.  Neurological: She is alert and oriented to person, place, and time. She has normal reflexes. No cranial nerve deficit. She exhibits normal muscle tone. Coordination normal.  Skin: Skin is warm and dry. No rash noted. She is not diaphoretic. No erythema. No pallor.  Psychiatric: She has a normal mood and affect. Her behavior is normal. Judgment and thought content normal.  Vitals reviewed.         Assessment & Plan:  Elevated liver enzymes - Plan: COMPLETE METABOLIC PANEL WITH GFR, ANA, AntiMicrosomal Ab-Liver / Kidney  Given the severity of her liver function test elevation, I am concerned about medication induced hepatotoxicity from methotrexate versus autoimmune hepatitis.  Repeat CMP today.  If liver function test remains significantly elevated, I will consult GI.  I have asked the patient to discontinue methotrexate altogether and I will notify her rheumatologist, Dr. Dierdre Forth of the significant elevation in her liver function test.  I would like to repeat liver  function test in 2 weeks after discontinuing methotrexate.  I will also begin work-up for autoimmune hepatitis but checking an ANA, and antimitochondrial antibody, and anti-liver kidney antibody.  Spent approximately 25 minutes with the patient discussing her lab test, diagnosis and work up.

## 2018-07-06 LAB — COMPLETE METABOLIC PANEL WITH GFR
AG Ratio: 1.3 (calc) (ref 1.0–2.5)
ALT: 97 U/L — ABNORMAL HIGH (ref 6–29)
AST: 142 U/L — ABNORMAL HIGH (ref 10–30)
Albumin: 4.4 g/dL (ref 3.6–5.1)
Alkaline phosphatase (APISO): 109 U/L (ref 33–115)
BUN: 10 mg/dL (ref 7–25)
CO2: 23 mmol/L (ref 20–32)
Calcium: 9.6 mg/dL (ref 8.6–10.2)
Chloride: 103 mmol/L (ref 98–110)
Creat: 0.83 mg/dL (ref 0.50–1.10)
GFR, Est African American: 104 mL/min/{1.73_m2} (ref >=60)
GFR, Est Non African American: 90 mL/min/{1.73_m2} (ref >=60)
Globulin: 3.4 g/dL (calc) (ref 1.9–3.7)
Glucose, Bld: 97 mg/dL (ref 65–99)
Potassium: 4 mmol/L (ref 3.5–5.3)
Sodium: 136 mmol/L (ref 135–146)
Total Bilirubin: 0.6 mg/dL (ref 0.2–1.2)
Total Protein: 7.8 g/dL (ref 6.1–8.1)

## 2018-07-06 LAB — ANTI-NUCLEAR AB-TITER (ANA TITER): ANA Titer 1: 1:160 {titer} — ABNORMAL HIGH

## 2018-07-06 LAB — ANTI-MICROSOMAL ANTIBODY LIVER / KIDNEY: LKM1 Ab: <=20 U (ref <=20.0)

## 2018-07-06 LAB — ANA: Anti Nuclear Antibody(ANA): POSITIVE — AB

## 2018-07-08 ENCOUNTER — Encounter (INDEPENDENT_AMBULATORY_CARE_PROVIDER_SITE_OTHER): Payer: Self-pay

## 2018-07-08 ENCOUNTER — Other Ambulatory Visit: Payer: Self-pay | Admitting: Family Medicine

## 2018-07-08 DIAGNOSIS — R748 Abnormal levels of other serum enzymes: Secondary | ICD-10-CM

## 2018-07-10 ENCOUNTER — Encounter: Payer: Self-pay | Admitting: Gastroenterology

## 2018-07-30 ENCOUNTER — Ambulatory Visit: Payer: BLUE CROSS/BLUE SHIELD

## 2018-08-02 ENCOUNTER — Ambulatory Visit (INDEPENDENT_AMBULATORY_CARE_PROVIDER_SITE_OTHER): Payer: BLUE CROSS/BLUE SHIELD | Admitting: *Deleted

## 2018-08-02 ENCOUNTER — Encounter: Payer: Self-pay | Admitting: *Deleted

## 2018-08-02 DIAGNOSIS — Z3202 Encounter for pregnancy test, result negative: Secondary | ICD-10-CM | POA: Diagnosis not present

## 2018-08-02 DIAGNOSIS — Z308 Encounter for other contraceptive management: Secondary | ICD-10-CM

## 2018-08-02 DIAGNOSIS — Z3042 Encounter for surveillance of injectable contraceptive: Secondary | ICD-10-CM

## 2018-08-02 LAB — POCT URINE PREGNANCY: Preg Test, Ur: NEGATIVE

## 2018-08-02 MED ORDER — MEDROXYPROGESTERONE ACETATE 150 MG/ML IM SUSP
150.0000 mg | Freq: Once | INTRAMUSCULAR | Status: AC
  Administered 2018-08-02: 150 mg via INTRAMUSCULAR

## 2018-08-02 NOTE — Progress Notes (Signed)
Pt here for Depo. Pt received shot in right deltoid. Pt tolerated shot well. Return in 12 weeks for next shot. JSY 

## 2018-08-28 ENCOUNTER — Other Ambulatory Visit: Payer: Self-pay | Admitting: Family Medicine

## 2018-08-28 DIAGNOSIS — K219 Gastro-esophageal reflux disease without esophagitis: Secondary | ICD-10-CM

## 2018-09-03 ENCOUNTER — Other Ambulatory Visit (INDEPENDENT_AMBULATORY_CARE_PROVIDER_SITE_OTHER): Payer: BLUE CROSS/BLUE SHIELD

## 2018-09-03 ENCOUNTER — Encounter: Payer: Self-pay | Admitting: Gastroenterology

## 2018-09-03 ENCOUNTER — Ambulatory Visit (INDEPENDENT_AMBULATORY_CARE_PROVIDER_SITE_OTHER): Payer: BLUE CROSS/BLUE SHIELD | Admitting: Gastroenterology

## 2018-09-03 VITALS — BP 140/80 | HR 100 | Ht 68.0 in | Wt 172.0 lb

## 2018-09-03 DIAGNOSIS — Z9221 Personal history of antineoplastic chemotherapy: Secondary | ICD-10-CM

## 2018-09-03 DIAGNOSIS — R748 Abnormal levels of other serum enzymes: Secondary | ICD-10-CM

## 2018-09-03 DIAGNOSIS — E781 Pure hyperglyceridemia: Secondary | ICD-10-CM

## 2018-09-03 DIAGNOSIS — R7989 Other specified abnormal findings of blood chemistry: Secondary | ICD-10-CM

## 2018-09-03 DIAGNOSIS — R945 Abnormal results of liver function studies: Secondary | ICD-10-CM

## 2018-09-03 LAB — FERRITIN: Ferritin: 1165.8 ng/mL — ABNORMAL HIGH (ref 10.0–291.0)

## 2018-09-03 MED ORDER — PANTOPRAZOLE SODIUM 40 MG PO TBEC: 40.0000 mg | DELAYED_RELEASE_TABLET | Freq: Two times a day (BID) | ORAL | 2 refills | Status: DC

## 2018-09-03 NOTE — Progress Notes (Signed)
GASTROENTEROLOGY OUTPATIENT CLINIC VISIT   Primary Care Provider Donita Brooks, MD [address] [place] [address] [phone]  Referring Provider Donita Brooks, MD [address] [place], [place] [address] [phone]  Patient Profile: Samantha Rosales is a 37 y.o. female with a pmh significant for RA, HTN, HLD, hx of HSV, HPV, GERD.  The patient presents to the [facility] for an evaluation and management of problem(s) noted below:  Problem List 1. Abnormal LFTs   2. Elevated lipase   3. History of methotrexate therapy   4. High triglycerides     History of Present Illness: This is the patient's first visit to the [facility] clinic.  She is referred because of a history of abnormal liver biochemical testing in the setting of prior methotrexate use.  Back in July, the patient states that she did not feel well for about a week but only had nausea and some vomiting and overall fatigue.  She does not recall having any significant abdominal pain.  She states the whole week and she was unable to tolerate oral intake.  As things progressed she ended up coming in to the hospital to be evaluated further seen in the ED.  Her liver biochemical tests suggested an AST over ALT of fourteen 100/883 with a bilirubin of 3.2 and an alkaline phosphatase of 126.  She underwent an abdominal ultrasound which was a limited right upper quadrant that showed no gallbladder pathology a negative Murphy sign and a CBD of 5 mm the liver itself showed no evidence of mass/lesion with normal Doppler flow.  The pancreas was not visualized.  Interestingly her lipase at the time was greater than 180 and she was diagnosed with pancreatitis.  She was discharged from the ED and asked to follow-up with her primary care physician.  She did that and at the time of being evaluated her liver tests had down trended slightly with an AST/ALT 142/97 with a bilirubin of 0.6.  Decision was  made to stop her methotrexate and for her to follow-up with her rheumatologist.  She reports seeing rheumatology in August on at least one occasion and had lab work that suggested improvement in her liver tests (she is seen at [facility] rheumatology but we do not have access to any of those records).  She denied any new onset of any other medications or over-the-counter supplements or teas/herbals.  At this point in time the patient feels much better but does still have a sensation of acid reflux.  The patient had been on methotrexate for almost 10 years.  She does drink socially on the weekends but does not drink on a daily basis.  The patient denies any issues with jaundice, scleral icterus, pruritus, darkened/amber urine, clay-colored stools, LE edema, hematemesis, coffee-ground emesis, abdominal distention, confusion, new generalized pruritus.  She has had acid reflux.  At times she feels like she has a water brash in her morning throat.  She is previously been on omeprazole and then transitioned to Protonix she has breakthrough of symptoms frequently.  She denies any dysphagia or odynophagia.  The patient denies any significant nonsteroidal use.  In regards to RFs for liver disease: High-Risk Sexual Practices -denies History of Alcohol Use -socially History of IVDU -denies History of Blood Donation/Transfusion -denies Personal Hx/Components of Metabolic Syndrome -denies FHx of Liver Disease -denies  GI Review of Systems Positive as above Negative for vomiting, change in bowel habits, melena, hematochezia  Review of Systems General: Positive for potential weight loss a few months ago but that has subsided; denies fevers/chills/night sweats HEENT: Denies oral lesions Cardiovascular: Denies chest pain Pulmonary: Denies shortness of breath Gastroenterological: See HPI Genitourinary: Denies darkened urine or hematuria Hematological: Denies easy bruising/bleeding Endocrine: Denies temperature  intolerance Dermatological: Denies skin changes Psychological: Mood is stable Musculoskeletal: Denies new arthralgias while staying on Humira only   Medications Current Outpatient Medications  Medication Sig Dispense Refill  . Biotin 1000 MCG tablet Take 1,000 mcg by mouth daily.    . Cholecalciferol (VITAMIN D-3) 1000 UNITS CAPS Take 1 capsule by mouth daily.    Marland Kitchen escitalopram (LEXAPRO) 10 MG tablet TAKE 1 TABLET BY MOUTH ONCE DAILY 90 tablet 3  . fluticasone (FLONASE) 50 MCG/ACT nasal spray USE TWO SPRAY(S) IN EACH NOSTRIL ONCE DAILY 16 g 11  . folic acid (FOLVITE) 1 MG tablet Take 1 mg by mouth daily.    Marland Kitchen HUMIRA PEN 40 MG/0.8ML PNKT Every 2 weeks  0  . losartan-hydrochlorothiazide (HYZAAR) 50-12.5 MG tablet TAKE 1 TABLET BY MOUTH ONCE DAILY 90 tablet 3  . medroxyPROGESTERone (DEPO-PROVERA) 150 MG/ML injection INJECT 1 ML INTRAMUSCULARLY IN DOCTOR'S OFFICE EVERY 3 MONTHS. 1 mL 4  . nebivolol (BYSTOLIC) 10 MG tablet Take 1 tablet (10 mg total) by mouth daily. 90 tablet 3  . Omega-3 Fatty Acids (FISH OIL) 1000 MG CAPS Take 1,000 mg by mouth daily.    . pantoprazole (PROTONIX) 20 MG tablet TAKE 1 TABLET BY MOUTH ONCE DAILY 30 tablet 1  . pantoprazole (PROTONIX) 40 MG tablet Take 1 tablet (40 mg total) by mouth 2 (two) times daily. 60 tablet 2   No current facility-administered medications for this visit.     Allergies Allergies  Allergen Reactions  . Penicillins     Has patient had a PCN reaction causing immediate rash, facial/tongue/throat swelling, SOB or lightheadedness with hypotension: Unknown Has patient had a PCN reaction causing severe rash involving mucus membranes or skin necrosis: Unknown Has patient had a PCN reaction that required hospitalization: Unknown Has patient had a PCN reaction occurring within the last 10 years: No If all of the above answers are "NO", then may proceed with Cephalosporin use.     Histories Past Medical History:  Diagnosis Date  . Abnormal  Pap smear   . Allergic rhinitis   . BV (bacterial vaginosis)   . GERD (gastroesophageal reflux disease)   . High cholesterol   . HSV-2 (herpes simplex virus 2) infection   . Hypertension   . Rheumatoid arthritis (HCC)   . Seizures (HCC)    dx as child with epilepsy but no seizures since 89 and on no meds  . Vaginal Pap smear, abnormal    Past Surgical History:  Procedure Laterality Date  . CERVICAL CONIZATION W/BX  12/20/2012   Procedure: CONIZATION CERVIX WITH BIOPSY;  Surgeon: Lazaro Arms, MD;  Location: [facility];  Service: Gynecology;  Laterality: N/A;  . HOLMIUM LASER APPLICATION  12/20/2012   Procedure: HOLMIUM LASER APPLICATION;  Surgeon: Lazaro Arms, MD;  Location: [facility];  Service: Gynecology;  Laterality: N/A;   Social History   Socioeconomic History  . Marital status: Single    Spouse name: Not on file  . Number of children: 2  . Years of education: Not on file  . Highest education level: Not on file  Occupational History  . 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  Financial resource strain: Not on file  . Food insecurity:    Worry: Not on file    Inability: Not on file  . Transportation needs:    Medical: Not on file    Non-medical: Not on file  Tobacco Use  . Smoking status: Current Every Day Smoker    Packs/day: 0.50    Years: 15.00    Pack years: 7.50    Types: Cigarettes  . Smokeless tobacco: Never Used  Substance and Sexual Activity  . Alcohol use: Yes    Comment: on the weekends drinks with friends  . Drug use: No  . Sexual activity: Yes    Birth control/protection: Injection    Comment: Depo Provera every 3 months  Lifestyle  . Physical activity:    Days per week: Not on file    Minutes per session: Not on file  . Stress: Not on file  Relationships  . Social connections:    Talks on phone: Not on file    Gets together: Not on file    Attends religious service: Not on file    Active  member of club or organization: Not on file    Attends meetings of clubs or organizations: Not on file    Relationship status: Not on file  . Intimate partner violence:    Fear of current or ex partner: Not on file    Emotionally abused: Not on file    Physically abused: Not on file    Forced sexual activity: Not on file  Other Topics Concern  . Not on file  Social History Narrative  . Not on file   Family History  Problem Relation Age of Onset  . Hypertension Mother   . Hypertension Father   . Pancreatic cancer Paternal Uncle   . Colon cancer Neg Hx   . Esophageal cancer Neg Hx   . Inflammatory bowel disease Neg Hx   . Liver disease Neg Hx   . Rectal cancer Neg Hx   . Stomach cancer Neg Hx    I have reviewed her medical, social, and family history in detail and updated the electronic medical record as necessary.    PHYSICAL EXAMINATION  BP 140/80   Pulse 100   Ht 5\' 8"  (1.727 m)   Wt 172 lb (78 kg)   BMI 26.15 kg/m  Wt Readings from Last 3 Encounters:  09/03/18 172 lb (78 kg)  07/02/18 170 lb (77.1 kg)  07/01/18 172 lb (78 kg)  GEN: NAD, appears stated age, doesn't appear chronically ill PSYCH: Cooperative, without pressured speech EYE: Conjunctivae pink, sclerae anicteric ENT: MMM, without oral ulcers NECK: Supple, without thyromegaly CV: RR without R/Gs  RESP: CTAB posteriorly, without wheezing GI: NABS, soft, NT/ND, without rebound or guarding, no HSM appreciated MSK/EXT: No lower extremity edema, no palmar erythema, no Dupuytren's contractures SKIN: No jaundice, no spider angiomata NEURO:  Alert & Oriented x 3, no focal deficits, no evidence of asterixis   REVIEW OF DATA  I reviewed the following data at the time of this encounter:  GI Procedures and Studies  No relevant studies  Laboratory Studies  Reviewed those labs that were in epic but we do not have [facility] rheumatology results  Imaging Studies  July 2019 ultrasound right upper  quadrant IMPRESSION: Normal right upper quadrant abdominal ultrasound exam.   ASSESSMENT  Ms. Virgen is a 37 y.o. female with a pmh significant for RA, HTN, HLD, hx of HSV, HPV, GERD.  The patient is  seen today for evaluation and management of:  1. Abnormal LFTs   2. Elevated lipase   3. History of methotrexate therapy   4. High triglycerides    This is a clinically and hemodynamically stable patient who is referred for further work-up of abnormal liver biochemical testing.  Her clinical history in July was suggestive of the possibility of underlying pancreatitis however she did not have significant abdominal pain but only nausea and overall feeling of being unwell during that time.  Her lipase was elevated to greater than 3 times the upper limit of normal.  In the setting of her abnormal liver tests a right upper quadrant ultrasound was performed did not show evidence of significant biliary ductal disease however the pancreas was not visualized completely.  She followed up with her primary care physician where labs were obtained and showed a slight decrease in the overall amount of her transferases.  She subsequently describes a follow-up with [facility] rheumatology after her methotrexate had been stopped.  She has been on methotrexate for greater than 10 years and never had a liver biopsy performed previously.  She is now off of methotrexate and remains just on Humira.  She describes that her labs were last checked at the end of August and were downtrending even further (we do not have these for review as of yet).  At this point the patient is feeling well overall and has very minimal symptoms other than her acid reflux issues that she has had for years.  I think it is reasonable to complete a serological work-up for etiologies of her significant transferase elevation which are outlined below to include also removing autoimmune hepatitis is a possibility due to her history of autoimmune disorders.  I  do not think that we need further imaging at this point.  I am concerned however about her elevation in her triglycerides to over thousand and the risk that this poses for possible pancreatitis.  I would like to recheck her triglycerides today.  In regards to the patient's history of heartburn/pyrosis I think a short trial of twice daily dosing PPI she is most of her symptoms are towards the evening would be reasonable and she will initiate this for the next 4 weeks.  As long as she is doing well from a liver biochemical perspective as well as from a acid reflux perspective we will plan for a follow-up in 4 to 6 weeks.  I briefly discussed the role of a liver biopsy in this patient but do not feel that is necessary at this time.  If her liver tests remain abnormal for a course of up to 6 months after she has had methotrexate removed then we will consider that as well as further cross-sectional imaging with a CT or MRI.  All patient questions were answered, to the best of my ability, and the patient agrees to the aforementioned plan of action with follow-up as indicated.   PLAN  1. Abnormal LFTs - Hepatic function panel; Future - Ferritin; Future - Tissue transglutaminase, IgA; Future - Anti-smooth muscle antibody, IgG; Future - Hepatitis A antibody, total; Future - Hepatitis B surface antibody,qualitative; Future - Hepatitis B core antibody, total; Future - Hold on Liver biopsy for now but may need down the road - Consider Liver Doppler U/S dependent on labs  2. Elevated lipase - Lipase; Future - Will consider cross-sectional imaging if remains elevated  3. History of methotrexate therapy  4. High triglycerides - Triglycerides; Future - If remains elevated  needs treatment to decrease risk of pancreatitis   Orders Placed This Encounter  Procedures  . Lipase  . Hepatic function panel  . Triglycerides  . Ferritin  . Tissue transglutaminase, IgA  . Anti-smooth muscle antibody, IgG  .  Hepatitis A antibody, total  . Hepatitis B surface antibody,qualitative  . Hepatitis B core antibody, total    New Prescriptions   PANTOPRAZOLE (PROTONIX) 40 MG TABLET    Take 1 tablet (40 mg total) by mouth 2 (two) times daily.   Modified Medications   No medications on file    Planned Follow Up: No follow-ups on file.   Corliss Parish, MD [facility] Office # [phone]

## 2018-09-03 NOTE — Patient Instructions (Signed)
Your provider has requested that you go to the basement level for lab work before leaving today. Press "B" on the elevator. The lab is located at the first door on the left as you exit the elevator.  We have sent the following medications to your pharmacy for you to pick up at your convenience:  We have scheduled you for a follow up appointment with [facility] on 09/24/18 at 1:45pm  Thank you for entrusting me with your care and choosing [facility].  Dr Meridee Score

## 2018-09-04 LAB — HEPATIC FUNCTION PANEL
AG Ratio: 1.1 (calc) (ref 1.0–2.5)
ALT: 57 U/L — ABNORMAL HIGH (ref 6–29)
AST: 85 U/L — ABNORMAL HIGH (ref 10–30)
Albumin: 4 g/dL (ref 3.6–5.1)
Alkaline phosphatase (APISO): 103 U/L (ref 33–115)
Bilirubin, Direct: 0.1 mg/dL (ref 0.0–0.2)
Globulin: 3.8 g/dL (calc) — ABNORMAL HIGH (ref 1.9–3.7)
Indirect Bilirubin: 0.5 mg/dL (calc) (ref 0.2–1.2)
Total Bilirubin: 0.6 mg/dL (ref 0.2–1.2)
Total Protein: 7.8 g/dL (ref 6.1–8.1)

## 2018-09-04 LAB — TRIGLYCERIDES: Triglycerides: 2920 mg/dL — ABNORMAL HIGH (ref <150)

## 2018-09-04 LAB — LIPASE: Lipase: 135 U/L — ABNORMAL HIGH (ref 7–60)

## 2018-09-05 ENCOUNTER — Other Ambulatory Visit: Payer: Self-pay | Admitting: Family Medicine

## 2018-09-05 ENCOUNTER — Other Ambulatory Visit: Payer: Self-pay

## 2018-09-05 ENCOUNTER — Telehealth: Payer: Self-pay | Admitting: Gastroenterology

## 2018-09-05 ENCOUNTER — Encounter: Payer: Self-pay | Admitting: Gastroenterology

## 2018-09-05 DIAGNOSIS — I10 Essential (primary) hypertension: Secondary | ICD-10-CM

## 2018-09-05 DIAGNOSIS — E781 Pure hyperglyceridemia: Secondary | ICD-10-CM | POA: Insufficient documentation

## 2018-09-05 DIAGNOSIS — R748 Abnormal levels of other serum enzymes: Secondary | ICD-10-CM | POA: Insufficient documentation

## 2018-09-05 DIAGNOSIS — R7989 Other specified abnormal findings of blood chemistry: Secondary | ICD-10-CM | POA: Insufficient documentation

## 2018-09-05 DIAGNOSIS — R945 Abnormal results of liver function studies: Secondary | ICD-10-CM

## 2018-09-05 DIAGNOSIS — Z9221 Personal history of antineoplastic chemotherapy: Secondary | ICD-10-CM | POA: Insufficient documentation

## 2018-09-05 MED ORDER — FENOFIBRATE 160 MG PO TABS: 160.0000 mg | ORAL_TABLET | Freq: Every day | ORAL | 11 refills | Status: DC

## 2018-09-05 MED ORDER — ICOSAPENT ETHYL 1 G PO CAPS: 2.0000 g | ORAL_CAPSULE | Freq: Two times a day (BID) | ORAL | 11 refills | Status: DC

## 2018-09-05 NOTE — Telephone Encounter (Signed)
You have been scheduled for a CT scan of the abdomen at [facility] ([address]---this is in the same building as [facility]).   You are scheduled on 09/17/18 at 3:30 pm. You should arrive 15 minutes prior to your appointment time for registration. Please follow the written instructions below on the day of your exam:  WARNING: IF YOU ARE ALLERGIC TO IODINE/X-RAY DYE, PLEASE NOTIFY RADIOLOGY IMMEDIATELY AT [phone]! YOU WILL BE GIVEN A 13 HOUR PREMEDICATION PREP.  1) Do not drink anything after 130 pm (2 hours prior to your test) 2) You have been given 2 bottles of oral contrast to drink. The solution may taste better if refrigerated, but do NOT add ice or any other liquid to this solution. Shake well before drinking.    Drink 1 bottle of contrast @ 2:30 pm (1 hours prior to your exam)   You may take any medications as prescribed with a small amount of water, if necessary. If you take any of the following medications: METFORMIN, GLUCOPHAGE, GLUCOVANCE, AVANDAMET, RIOMET, FORTAMET, [place] MET, JANUMET, GLUMETZA or METAGLIP, you MAY be asked to HOLD this medication 48 hours AFTER the exam.  The purpose of you drinking the oral contrast is to aid in the visualization of your intestinal tract. The contrast solution may cause some diarrhea. Depending on your individual set of symptoms, you may also receive an intravenous injection of x-ray contrast/dye. Plan on being at [facility] for 30 minutes or longer, depending on the type of exam you are having performed.  This test typically takes 30-45 minutes to complete.  If you have any questions regarding your exam or if you need to reschedule, you may call the CT department at [phone] between the hours of 8:00 am and 5:00 pm, Monday-Friday.  ________________________________________________________________________

## 2018-09-05 NOTE — Telephone Encounter (Signed)
Mansouraty, Netty Starring., MD  Loretha Stapler, RN Cc: Donita Brooks, MD        Macrina Lehnert I have released the results to the patient via MyChart.  In regards to her liver tests, there will need to be some additional labwork that we will need to set up and help patient understand why we are doing it.  In regards to her significantly elevated Ferritin, we will need to exclude potentially Hemochromatosis. Please send an Iron/TIBC and repeat Ferritin next week. If they show evidence of true elevation and Iron increase in body, then we will need to have come in to do the Genetic testing for HFE gene analysis. I would not send that unless we have multiple elevated labs showing as this is a very expensive test.  In regards to her lipase elevation though not meeting criteria for Pancreatitis, and as she has not had any imaging of her pancreas as of yet. Let's plan to get a CT-Abdomen with contrast for elevation of lipase and prior possible pancreatitis. I'd like to see what the liver and pancreas look like now.  In regards to the Triglycerides, I have placed the patient's PCP here as well, these are significantly elevated, and she needs to be on some sort of other treatment as this will pose risk of pancreatitis to her in the future. Dr. Tanya Nones, you make mention in older notes of a Fenofibrate but it is not in her current medication list, if a Fenofibrate doesn't work probably needs to be seen in Endocrinology ASAP to decrease her risk of issues in the near future.  Thanks Lamarco Gudiel for getting the labwork and imaging set up and talking with her.  Liz Beach

## 2018-09-05 NOTE — Telephone Encounter (Signed)
The patient has been notified of this information and all questions answered.   The pt has been advised of the information and verbalized understanding.    The pt should come in for labs next week.

## 2018-09-06 LAB — ANTI-SMOOTH MUSCLE ANTIBODY, IGG: Actin (Smooth Muscle) Antibody (IGG): <20 U (ref <20)

## 2018-09-06 LAB — HEPATITIS A ANTIBODY, TOTAL: Hepatitis A AB,Total: NONREACTIVE

## 2018-09-06 LAB — TISSUE TRANSGLUTAMINASE, IGA: (tTG) Ab, IgA: 1 U/mL

## 2018-09-06 LAB — HEPATITIS B CORE ANTIBODY, TOTAL: Hep B Core Total Ab: NONREACTIVE

## 2018-09-06 LAB — HEPATITIS B SURFACE ANTIBODY,QUALITATIVE: Hep B S Ab: NONREACTIVE

## 2018-09-10 ENCOUNTER — Other Ambulatory Visit (INDEPENDENT_AMBULATORY_CARE_PROVIDER_SITE_OTHER): Payer: BLUE CROSS/BLUE SHIELD

## 2018-09-10 DIAGNOSIS — R945 Abnormal results of liver function studies: Secondary | ICD-10-CM

## 2018-09-10 DIAGNOSIS — R748 Abnormal levels of other serum enzymes: Secondary | ICD-10-CM | POA: Diagnosis not present

## 2018-09-10 DIAGNOSIS — R7989 Other specified abnormal findings of blood chemistry: Secondary | ICD-10-CM

## 2018-09-10 LAB — IBC PANEL
Iron: 191 ug/dL — ABNORMAL HIGH (ref 42–145)
Saturation Ratios: 41.6 % (ref 20.0–50.0)
Transferrin: 328 mg/dL (ref 212.0–360.0)

## 2018-09-10 LAB — FERRITIN: Ferritin: 635.1 ng/mL — ABNORMAL HIGH (ref 10.0–291.0)

## 2018-09-12 ENCOUNTER — Other Ambulatory Visit: Payer: Self-pay | Admitting: Gastroenterology

## 2018-09-12 DIAGNOSIS — R748 Abnormal levels of other serum enzymes: Secondary | ICD-10-CM

## 2018-09-15 ENCOUNTER — Encounter: Payer: Self-pay | Admitting: Gastroenterology

## 2018-09-15 NOTE — Progress Notes (Unsigned)
Review of records from [facility] rheumatology  November 2017 Hepatitis A IgM negative Hepatitis B surface antigen negative Hepatitis B core IgM negative Hepatitis C antibody negative QuantiFERON gold negative Rheumatoid factor positive  CRP 8.06 May 2017 AST/ALT 49/55 Alkaline phosphatase 92 Total bili 0.4 Platelets 224,000  October 2018 AST/ALT 48/52 Alkaline phosphatase 79 Total bili 0.3 Platelets 254,000  January 2019 AST/ALT 55/41 Alkaline phosphatase 94 Total bili 0.2 Platelets 219,000  April 2019 AST/ALT 53/43 Alkaline phosphatase 74 Total bili 0.5 Platelets 213,000  August 2019 AST/ALT 70/77 Alkaline phosphatase 108 Total bili 0.8 Platelets 241,000

## 2018-09-17 ENCOUNTER — Ambulatory Visit (INDEPENDENT_AMBULATORY_CARE_PROVIDER_SITE_OTHER)
Admission: RE | Admit: 2018-09-17 | Discharge: 2018-09-17 | Disposition: A | Discharge status: Home / Self Care | Payer: BLUE CROSS/BLUE SHIELD | Source: Ambulatory Visit | Attending: Gastroenterology | Admitting: Gastroenterology

## 2018-09-17 DIAGNOSIS — R945 Abnormal results of liver function studies: Secondary | ICD-10-CM

## 2018-09-17 IMAGING — CT CT ABDOMEN W/ CM
2 of 4 series · 16 of 46 positions shown, 18 images · IV contrast (ISOVUE 300)
Comparison: [DATE]

CLINICAL DATA: Elevated LFTs.

EXAM:
CT ABDOMEN WITH CONTRAST
TECHNIQUE: Multidetector CT imaging of the abdomen was performed using the
standard protocol following bolus administration of intravenous
contrast.
CONTRAST:  100mL [JW] IOPAMIDOL ([JW]) INJECTION 61%

[Series 2: abdomen w 5.0 i40f 2 · axial · 0.71mm/px · z∈[-280,-66]mm · 13 of 49 slices shown, 15 images]
[im 3/49  soft-tissue]
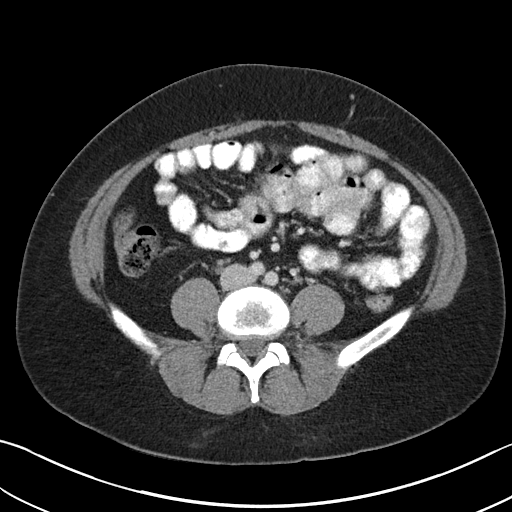
[im 3/49  bone]
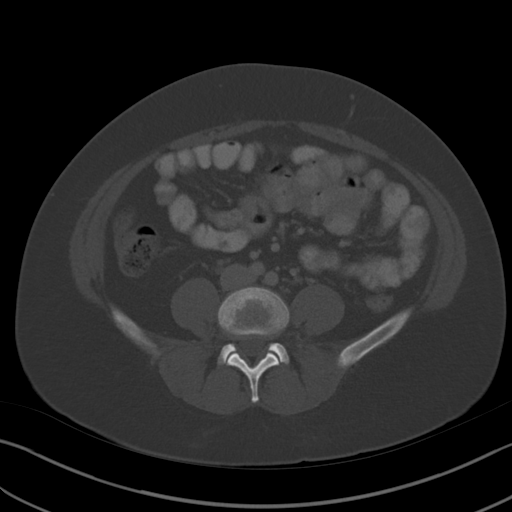
[im 8/49  soft-tissue]
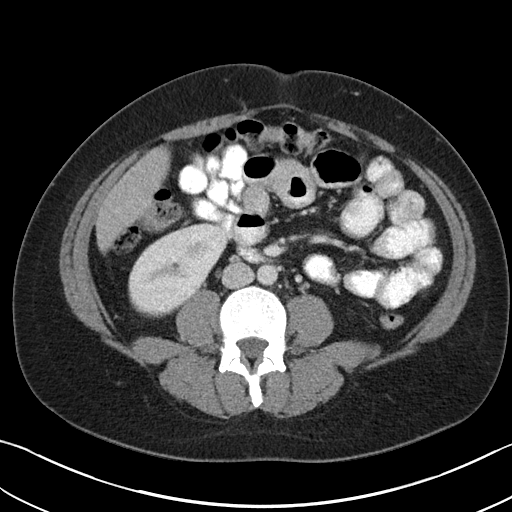
[im 10/49  soft-tissue]
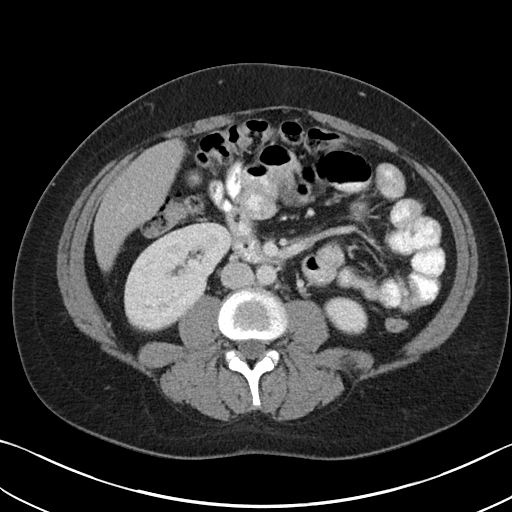
[im 15/49  soft-tissue]
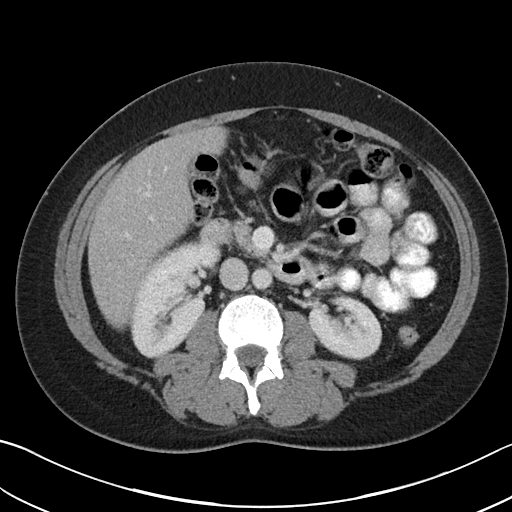
[im 17/49  soft-tissue]
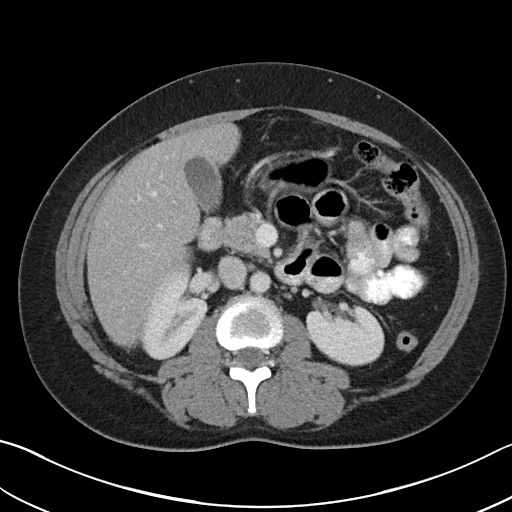
[im 22/49  soft-tissue]
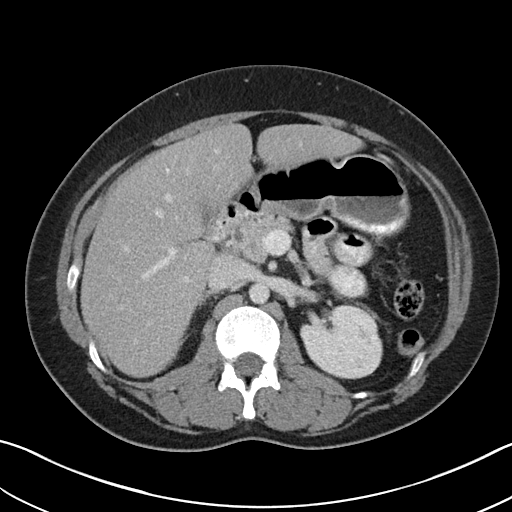
[im 25/49  soft-tissue]
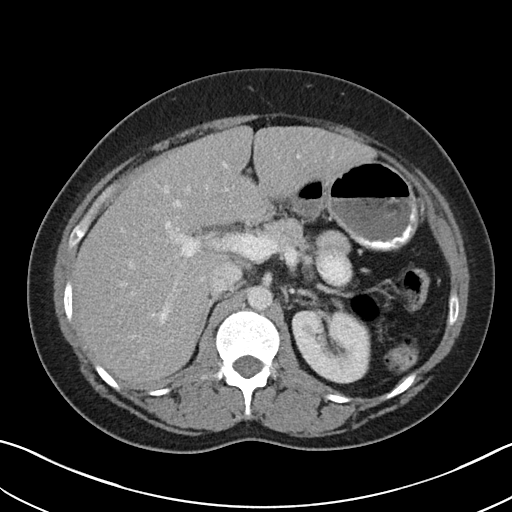
[im 27/49  soft-tissue]
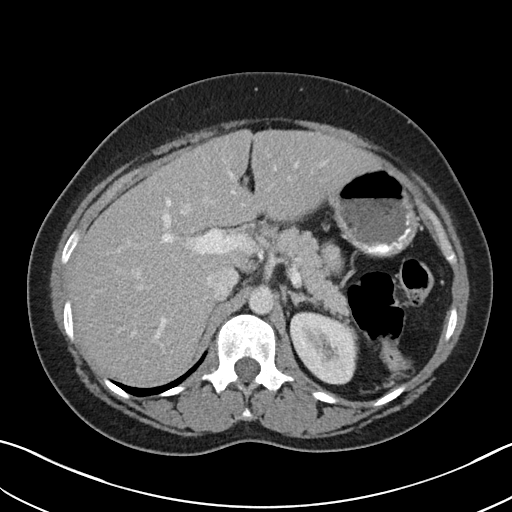
[im 32/49  soft-tissue]
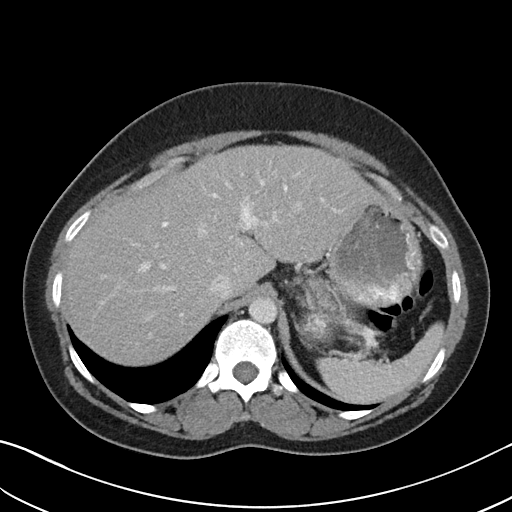
[im 32/49  bone]
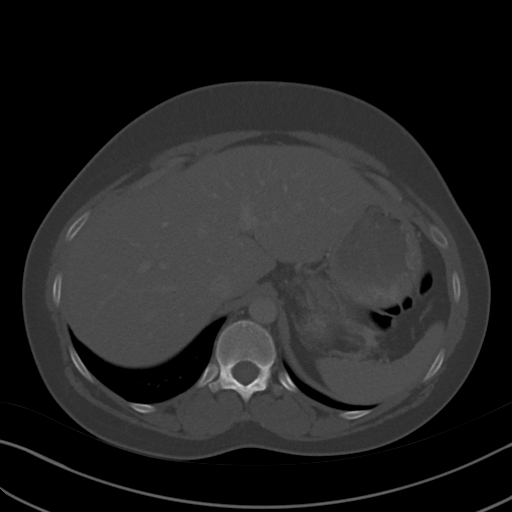
[im 34/49  soft-tissue]
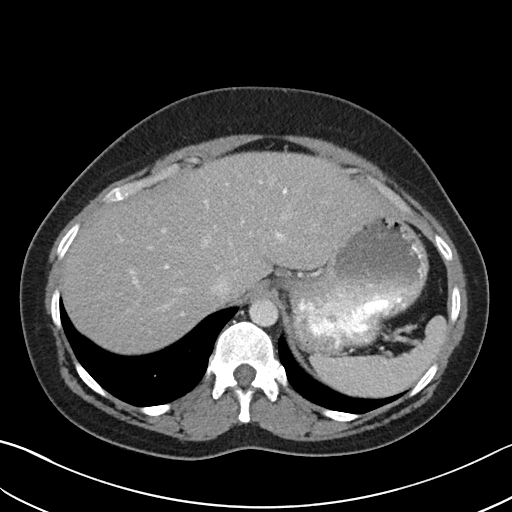
[im 39/49  soft-tissue]
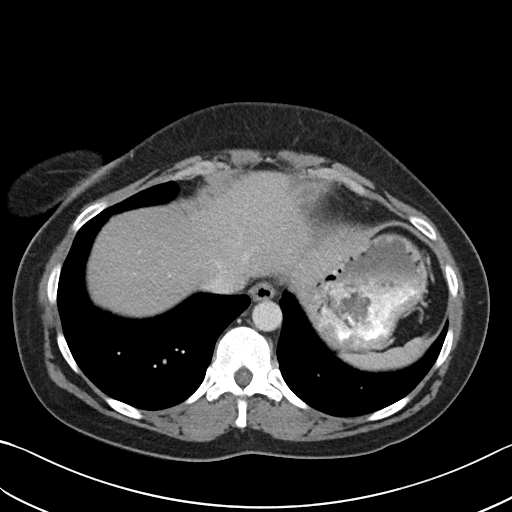
[im 41/49  soft-tissue]
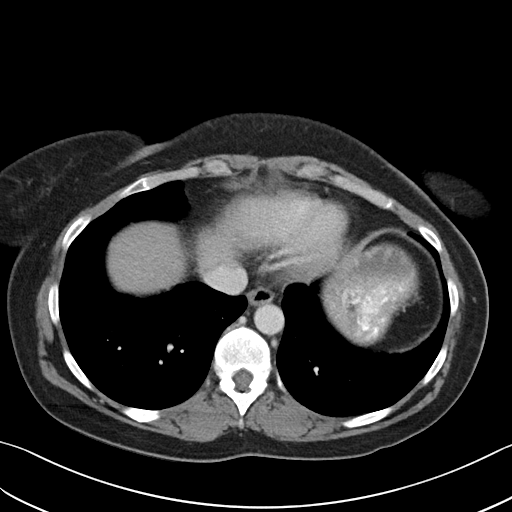
[im 46/49  soft-tissue]
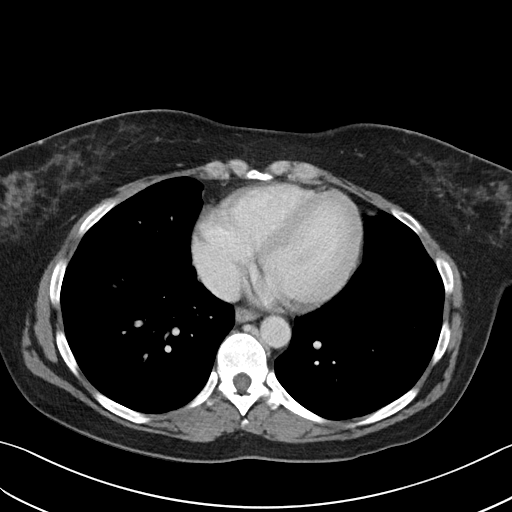

[Series 5: coronal · coronal · 0.48mm/px · 3 of 85 slices shown]
[im 29/85  soft-tissue]
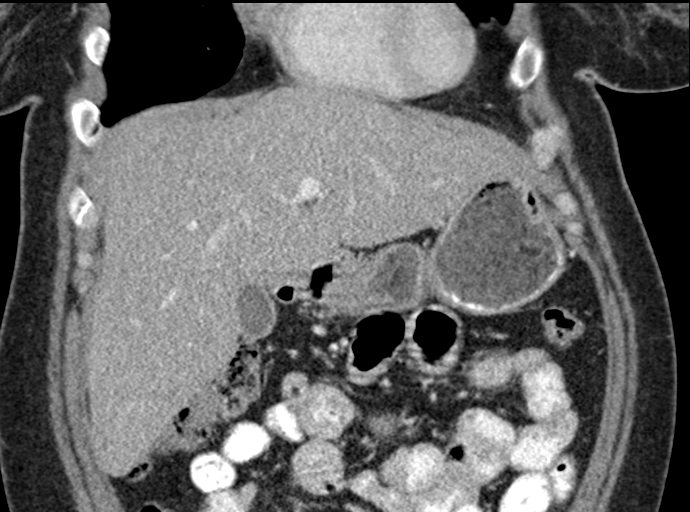
[im 38/85  soft-tissue]
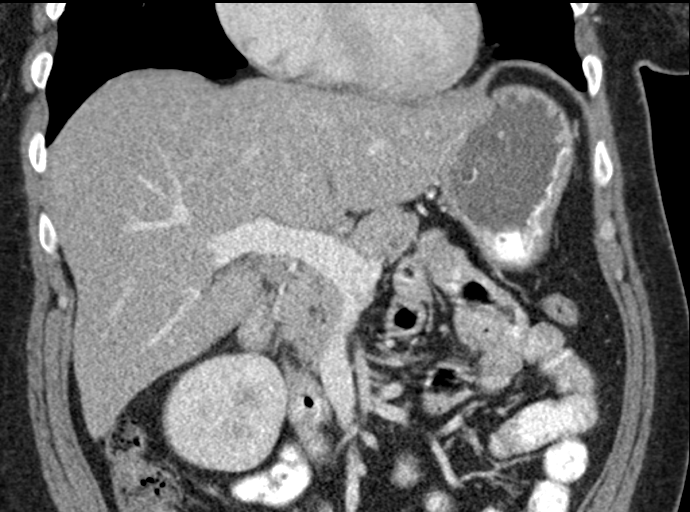
[im 47/85  soft-tissue]
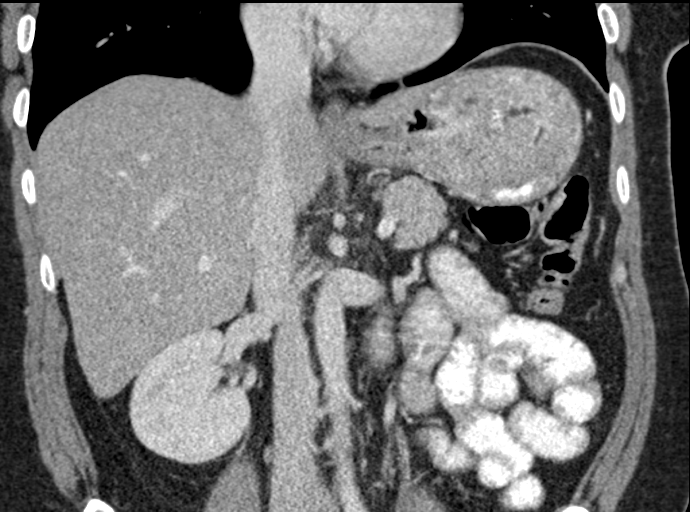

[16 of 46 positions shown; findings below may reference images not displayed]

FINDINGS: Lower chest: No acute abnormality.

Hepatobiliary: No focal liver abnormality is seen. No gallstones,
gallbladder wall thickening, or biliary dilatation.

Pancreas: Unremarkable. No pancreatic ductal dilatation or
surrounding inflammatory changes.

Spleen: Normal in size without focal abnormality.

Adrenals/Urinary Tract: Adrenal glands are unremarkable. Kidneys are
normal, without renal calculi, focal lesion, or hydronephrosis.

Stomach/Bowel: Stomach is within normal limits. No evidence of bowel
wall thickening, distention, or inflammatory changes.

Vascular/Lymphatic: No significant vascular findings are present. No
enlarged abdominal or pelvic lymph nodes.

Other: No abdominal wall hernia or abnormality. No abdominopelvic
ascites.

Musculoskeletal: No acute or significant osseous findings.
IMPRESSION: 1. Normal exam.  No findings to explain elevated LFTs.

## 2018-09-17 MED ORDER — IOPAMIDOL (ISOVUE-300) INJECTION 61%
100.0000 mL | Freq: Once | INTRAVENOUS | Status: AC | PRN
  Administered 2018-09-17: 100 mL via INTRAVENOUS

## 2018-09-18 ENCOUNTER — Other Ambulatory Visit: Payer: Self-pay

## 2018-09-18 DIAGNOSIS — R748 Abnormal levels of other serum enzymes: Secondary | ICD-10-CM

## 2018-09-24 ENCOUNTER — Ambulatory Visit: Payer: BLUE CROSS/BLUE SHIELD | Admitting: Physician Assistant

## 2018-09-27 ENCOUNTER — Ambulatory Visit: Payer: BLUE CROSS/BLUE SHIELD | Admitting: Physician Assistant

## 2018-09-27 ENCOUNTER — Other Ambulatory Visit: Payer: Self-pay

## 2018-09-27 ENCOUNTER — Other Ambulatory Visit (INDEPENDENT_AMBULATORY_CARE_PROVIDER_SITE_OTHER): Payer: BLUE CROSS/BLUE SHIELD

## 2018-09-27 DIAGNOSIS — E781 Pure hyperglyceridemia: Secondary | ICD-10-CM

## 2018-09-27 DIAGNOSIS — I10 Essential (primary) hypertension: Secondary | ICD-10-CM

## 2018-09-27 DIAGNOSIS — R748 Abnormal levels of other serum enzymes: Secondary | ICD-10-CM

## 2018-09-27 LAB — PROTIME-INR
INR: 1 ratio (ref 0.8–1.0)
Prothrombin Time: 12 s (ref 9.6–13.1)

## 2018-09-27 LAB — HEPATIC FUNCTION PANEL
ALT: 28 U/L (ref 0–35)
AST: 43 U/L — ABNORMAL HIGH (ref 0–37)
Albumin: 4.5 g/dL (ref 3.5–5.2)
Alkaline Phosphatase: 72 U/L (ref 39–117)
Bilirubin, Direct: 0.1 mg/dL (ref 0.0–0.3)
Total Bilirubin: 0.4 mg/dL (ref 0.2–1.2)
Total Protein: 8.2 g/dL (ref 6.0–8.3)

## 2018-09-27 LAB — IGA: IgA: 513 mg/dL — ABNORMAL HIGH (ref 68–378)

## 2018-09-27 NOTE — Progress Notes (Unsigned)
cmp

## 2018-09-28 LAB — LIPID PANEL
Cholesterol: 178 mg/dL (ref <200)
HDL: 53 mg/dL (ref >50)
Non-HDL Cholesterol (Calc): 125 mg/dL (calc) (ref <130)
Total CHOL/HDL Ratio: 3.4 (calc) (ref <5.0)
Triglycerides: 501 mg/dL — ABNORMAL HIGH (ref <150)

## 2018-09-28 LAB — IGG 1, 2, 3, AND 4
IgG (Immunoglobin G), Serum: 1321 mg/dL (ref 700–1600)
IgG, Subclass 1: 845 mg/dL — ABNORMAL HIGH (ref 248–810)
IgG, Subclass 2: 212 mg/dL (ref 130–555)
IgG, Subclass 3: 201 mg/dL — ABNORMAL HIGH (ref 15–102)
IgG, Subclass 4: 116 mg/dL — ABNORMAL HIGH (ref 2–96)

## 2018-09-28 LAB — COMPREHENSIVE METABOLIC PANEL
AG Ratio: 1.3 (calc) (ref 1.0–2.5)
ALT: 29 U/L (ref 6–29)
AST: 44 U/L — ABNORMAL HIGH (ref 10–30)
Albumin: 4.4 g/dL (ref 3.6–5.1)
Alkaline phosphatase (APISO): 73 U/L (ref 33–115)
BUN: 8 mg/dL (ref 7–25)
CO2: 25 mmol/L (ref 20–32)
Calcium: 9.5 mg/dL (ref 8.6–10.2)
Chloride: 104 mmol/L (ref 98–110)
Creat: 0.88 mg/dL (ref 0.50–1.10)
Globulin: 3.3 g/dL (calc) (ref 1.9–3.7)
Glucose, Bld: 92 mg/dL (ref 65–99)
Potassium: 3.3 mmol/L — ABNORMAL LOW (ref 3.5–5.3)
Sodium: 141 mmol/L (ref 135–146)
Total Bilirubin: 0.4 mg/dL (ref 0.2–1.2)
Total Protein: 7.7 g/dL (ref 6.1–8.1)

## 2018-10-01 ENCOUNTER — Telehealth: Payer: Self-pay | Admitting: Gastroenterology

## 2018-10-01 LAB — TIQ- AMBIGUOUS ORDER

## 2018-10-01 NOTE — Telephone Encounter (Signed)
The lab has been advised of the information and verbalized understanding.  Test code 36144 Hereditary Hemochromatosis Gene Analysis

## 2018-10-01 NOTE — Telephone Encounter (Signed)
Lab calling to clarify blood test ordered for pt. They already collected specimen just need to know what to do with it. Pls call lab

## 2018-10-06 LAB — HEMOCHROMATOSIS DNA-PCR(C282Y,H63D)

## 2018-10-09 ENCOUNTER — Telehealth: Payer: Self-pay | Admitting: Gastroenterology

## 2018-10-09 NOTE — Telephone Encounter (Signed)
See results note. 

## 2018-10-10 ENCOUNTER — Other Ambulatory Visit: Payer: Self-pay

## 2018-10-10 DIAGNOSIS — R945 Abnormal results of liver function studies: Secondary | ICD-10-CM

## 2018-10-10 DIAGNOSIS — R748 Abnormal levels of other serum enzymes: Secondary | ICD-10-CM

## 2018-10-11 ENCOUNTER — Ambulatory Visit: Payer: BLUE CROSS/BLUE SHIELD | Admitting: Physician Assistant

## 2018-10-21 ENCOUNTER — Encounter: Payer: Self-pay | Admitting: Physician Assistant

## 2018-10-21 ENCOUNTER — Ambulatory Visit (INDEPENDENT_AMBULATORY_CARE_PROVIDER_SITE_OTHER): Payer: BLUE CROSS/BLUE SHIELD | Admitting: Physician Assistant

## 2018-10-21 VITALS — BP 138/86 | HR 88 | Ht 68.0 in | Wt 178.2 lb

## 2018-10-21 DIAGNOSIS — R945 Abnormal results of liver function studies: Secondary | ICD-10-CM | POA: Diagnosis not present

## 2018-10-21 DIAGNOSIS — R7989 Other specified abnormal findings of blood chemistry: Secondary | ICD-10-CM

## 2018-10-21 NOTE — Progress Notes (Signed)
Chief Complaint: Follow-up elevated LFTs and lipase  HPI:    Samantha Rosales is a 37 year old African-American female with past medical history as listed below, known to Dr. Meridee Score, who returns to clinic today for follow-up of her elevated liver enzymes and lipase.    09/03/2018 office visit with Dr. Meridee Score.  At that time, she had been referred for elevated LFTs in the setting of prior Methotrexate use.  She had also been seen in the ED for nausea and vomiting with abdominal pain for a week.  At that time her LFTs were elevated.  She had an abdominal ultrasound, limited right upper quadrant showed no gallbladder pathology.  Interestingly her lipase at that time was greater than 180 and she was diagnosed with pancreatitis.  At last visit she had a repeat hepatic function panel, ferritin, tissue transglutaminase IgA, anti-smooth muscle antibody and hepatitis testing.  Later his CT was ordered. (please see Dr. Elesa Hacker in depth consult note from last visit)       09/27/2018 LFTs were normal other than an AST of 44.  All other labs have been normal.  Hemochromatosis testing was negative.  09/17/2018 patient had a CT of the abdomen which was normal.  At this point all testing has been negative/normal.    Today, patient tells me that she is feeling well.  She has no abdominal pain or further episodes of nausea.  She does describe at least 1-2 episodes of looser stool throughout the week but tells me that this is somewhat normal for her and is typically related to her diet.  She denies any other complaints or concerns today.    Denies fever, chills, weight loss, anorexia, nausea, vomiting, heartburn, reflux or symptoms that awaken her from sleep.  Past Medical History:  Diagnosis Date  . Abnormal Pap smear   . Allergic rhinitis   . BV (bacterial vaginosis)   . GERD (gastroesophageal reflux disease)   . High cholesterol   . HSV-2 (herpes simplex virus 2) infection   . Hypertension   . Rheumatoid  arthritis (HCC)   . Seizures (HCC)    dx as child with epilepsy but no seizures since 89 and on no meds  . Vaginal Pap smear, abnormal     Past Surgical History:  Procedure Laterality Date  . CERVICAL CONIZATION W/BX  12/20/2012   Procedure: CONIZATION CERVIX WITH BIOPSY;  Surgeon: Lazaro Arms, MD;  Location: [facility];  Service: Gynecology;  Laterality: N/A;  . HOLMIUM LASER APPLICATION  12/20/2012   Procedure: HOLMIUM LASER APPLICATION;  Surgeon: Lazaro Arms, MD;  Location: [facility];  Service: Gynecology;  Laterality: N/A;    Current Outpatient Medications  Medication Sig Dispense Refill  . Biotin 1000 MCG tablet Take 1,000 mcg by mouth daily.    . Cholecalciferol (VITAMIN D-3) 1000 UNITS CAPS Take 1 capsule by mouth daily.    Marland Kitchen escitalopram (LEXAPRO) 10 MG tablet TAKE 1 TABLET BY MOUTH ONCE DAILY 90 tablet 3  . fenofibrate 160 MG tablet Take 1 tablet (160 mg total) by mouth daily. 30 tablet 11  . fluticasone (FLONASE) 50 MCG/ACT nasal spray USE TWO SPRAY(S) IN EACH NOSTRIL ONCE DAILY 16 g 11  . folic acid (FOLVITE) 1 MG tablet Take 1 mg by mouth daily.    Marland Kitchen HUMIRA PEN 40 MG/0.8ML PNKT Every 2 weeks  0  . Icosapent Ethyl (VASCEPA) 1 g CAPS Take 2 capsules (2 g total) by mouth 2 (two) times daily. 120 capsule 11  .  losartan-hydrochlorothiazide (HYZAAR) 50-12.5 MG tablet TAKE 1 TABLET BY MOUTH ONCE DAILY 90 tablet 3  . medroxyPROGESTERone (DEPO-PROVERA) 150 MG/ML injection INJECT 1 ML INTRAMUSCULARLY IN DOCTOR'S OFFICE EVERY 3 MONTHS. 1 mL 4  . nebivolol (BYSTOLIC) 10 MG tablet Take 1 tablet (10 mg total) by mouth daily. 90 tablet 3  . pantoprazole (PROTONIX) 40 MG tablet Take 1 tablet (40 mg total) by mouth 2 (two) times daily. 60 tablet 2   No current facility-administered medications for this visit.     Allergies as of 10/21/2018 - Review Complete 10/21/2018  Allergen Reaction Noted  . Penicillins  12/13/2012    Family History  Problem Relation Age of Onset  . Hypertension  Mother   . Hypertension Father   . Pancreatic cancer Paternal Uncle   . Colon cancer Neg Hx   . Esophageal cancer Neg Hx   . Inflammatory bowel disease Neg Hx   . Liver disease Neg Hx   . Rectal cancer Neg Hx   . Stomach cancer Neg Hx     Social History   Socioeconomic History  . Marital status: Single    Spouse name: Not on file  . Number of children: 2  . Years of education: Not on file  . Highest education level: Not on file  Occupational History  . 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 Financial resource strain: Not on file  . Food insecurity:    Worry: Not on file    Inability: Not on file  . Transportation needs:    Medical: Not on file    Non-medical: Not on file  Tobacco Use  . Smoking status: Current Every Day Smoker    Packs/day: 0.50    Years: 15.00    Pack years: 7.50    Types: Cigarettes  . Smokeless tobacco: Never Used  Substance and Sexual Activity  . Alcohol use: Yes    Comment: on the weekends drinks with friends  . Drug use: No  . Sexual activity: Yes    Birth control/protection: Injection    Comment: Depo Provera every 3 months  Lifestyle  . Physical activity:    Days per week: Not on file    Minutes per session: Not on file  . Stress: Not on file  Relationships  . Social connections:    Talks on phone: Not on file    Gets together: Not on file    Attends religious service: Not on file    Active member of club or organization: Not on file    Attends meetings of clubs or organizations: Not on file    Relationship status: Not on file  . Intimate partner violence:    Fear of current or ex partner: Not on file    Emotionally abused: Not on file    Physically abused: Not on file    Forced sexual activity: Not on file  Other Topics Concern  . Not on file  Social History Narrative  . Not on file    Review of Systems:    Constitutional: No weight loss, fever or chills Cardiovascular:  No chest pain Respiratory: No SOB Gastrointestinal: See HPI and otherwise negative   Physical Exam:  Vital signs: BP 138/86   Pulse 88   Ht 5\' 8"  (1.727 m)   Wt 178 lb 4 oz (80.9 kg)   BMI 27.10 kg/m   Constitutional:   Pleasant AA female appears to be in NAD, Well  developed, Well nourished, alert and cooperative Respiratory: Respirations even and unlabored. Lungs clear to auscultation bilaterally.   No wheezes, crackles, or rhonchi.  Cardiovascular: Normal S1, S2. No MRG. Regular rate and rhythm. No peripheral edema, cyanosis or pallor.  Gastrointestinal:  Soft, nondistended, nontender. No rebound or guarding. Normal bowel sounds. No appreciable masses or hepatomegaly. Psychiatric: Demonstrates good judgement and reason without abnormal affect or behaviors.  RELEVANT LABS AND IMAGING: CBC    Component Value Date/Time   WBC 7.0 06/14/2018 2302   RBC 3.89 06/14/2018 2302   HGB 13.0 06/14/2018 2302   HCT 38.3 06/14/2018 2302   PLT 190 06/14/2018 2302   MCV 98.5 06/14/2018 2302   MCH 33.4 06/14/2018 2302   MCHC 33.9 06/14/2018 2302   RDW 13.0 06/14/2018 2302   LYMPHSABS 2.9 06/14/2018 2302   MONOABS 0.5 06/14/2018 2302   EOSABS 0.1 06/14/2018 2302   BASOSABS 0.0 06/14/2018 2302    CMP     Component Value Date/Time   NA 141 09/27/2018 1510   K 3.3 (L) 09/27/2018 1510   CL 104 09/27/2018 1510   CO2 25 09/27/2018 1510   GLUCOSE 92 09/27/2018 1510   BUN 8 09/27/2018 1510   CREATININE 0.88 09/27/2018 1510   CALCIUM 9.5 09/27/2018 1510   PROT 8.2 09/27/2018 1510   PROT 7.7 09/27/2018 1510   ALBUMIN 4.5 09/27/2018 1510   AST 43 (H) 09/27/2018 1510   AST 44 (H) 09/27/2018 1510   ALT 28 09/27/2018 1510   ALT 29 09/27/2018 1510   ALKPHOS 72 09/27/2018 1510   BILITOT 0.4 09/27/2018 1510   BILITOT 0.4 09/27/2018 1510   GFRNONAA 90 07/02/2018 1629   GFRAA 104 07/02/2018 1629    Assessment: 1.  Elevated LFTs: With history of Methotrexate use, so far testing has night  been negative/normal including right upper quadrant ultrasound, CT of the liver, liver serologies, hepatitis testing and others, liver enzymes have trended down  Plan: 1.  Patient was advised by Dr. Meridee Score to have recheck labs in 6 weeks.  These include a hepatic function panel, iron panel and ferritin. 2.  Patient will follow with Dr. Meridee Score as advised after labs above.  Hyacinth Meeker, PA-C [facility] 10/21/2018, 8:26 AM  Cc: Donita Brooks, MD

## 2018-10-21 NOTE — Patient Instructions (Signed)
If you are age 38 or older, your body mass index should be between 23-30. Your Body mass index is 27.1 kg/m. If this is out of the aforementioned range listed, please consider follow up with your Primary Care Provider.  If you are age 1 or younger, your body mass index should be between 19-25. Your Body mass index is 27.1 kg/m. If this is out of the aformentioned range listed, please consider follow up with your Primary Care Provider.   Recheck labs in 6 weeks.December 02, 2018.  Thank you for choosing me and [facility].   Hyacinth Meeker, PA-C

## 2018-10-21 NOTE — Progress Notes (Signed)
Agree with assessment and plan. Hold on Liver biopsy currently as LFTs are improving while longer period off MTX.

## 2018-10-25 ENCOUNTER — Encounter: Payer: Self-pay | Admitting: Family Medicine

## 2018-10-25 ENCOUNTER — Ambulatory Visit (INDEPENDENT_AMBULATORY_CARE_PROVIDER_SITE_OTHER): Payer: BLUE CROSS/BLUE SHIELD | Admitting: *Deleted

## 2018-10-25 ENCOUNTER — Ambulatory Visit (INDEPENDENT_AMBULATORY_CARE_PROVIDER_SITE_OTHER): Payer: BLUE CROSS/BLUE SHIELD | Admitting: Family Medicine

## 2018-10-25 VITALS — BP 142/88 | HR 94 | Temp 98.1°F | Resp 14 | Ht 68.0 in | Wt 177.0 lb

## 2018-10-25 DIAGNOSIS — G43009 Migraine without aura, not intractable, without status migrainosus: Secondary | ICD-10-CM | POA: Diagnosis not present

## 2018-10-25 DIAGNOSIS — Z3042 Encounter for surveillance of injectable contraceptive: Secondary | ICD-10-CM | POA: Diagnosis not present

## 2018-10-25 DIAGNOSIS — Z3202 Encounter for pregnancy test, result negative: Secondary | ICD-10-CM | POA: Diagnosis not present

## 2018-10-25 LAB — POCT URINE PREGNANCY: Preg Test, Ur: NEGATIVE

## 2018-10-25 MED ORDER — MEDROXYPROGESTERONE ACETATE 150 MG/ML IM SUSP
150.0000 mg | Freq: Once | INTRAMUSCULAR | Status: AC
  Administered 2018-10-25: 150 mg via INTRAMUSCULAR

## 2018-10-25 MED ORDER — SUMATRIPTAN SUCCINATE 50 MG PO TABS: 50.0000 mg | ORAL_TABLET | ORAL | 3 refills | Status: DC | PRN

## 2018-10-25 NOTE — Progress Notes (Signed)
Depo Provera given IM in left deltoid. Patient tolerated well. Next dose in 12 weeks.

## 2018-10-25 NOTE — Progress Notes (Signed)
Subjective:    Patient ID: Samantha Rosales, female    DOB: 31-Oct-1981, 37 y.o.   MRN: [id number]  HPI Patient presents today complaining of migraine headaches.  She has a history of migraine headaches.  They are infrequent.  They occur once or twice a month.  There are no triggers.  She denies any aura.  They are pulsatile with photophobia and phonophobia and causing nausea.  They tend the last 1 to 2 days and resolve spontaneously.  She would like to have a medication she could take at the first sign of a migraine headache to abort the headache.  At her previous office visit, she was found to have significant hepatitis.  After a thorough diagnostic work-up it appears to be due to methotrexate.  Her liver test have essentially normalized.  She is still following up with GI.  However her triglycerides are extremely high and were up to 2900 in October.  I started the patient back on fenofibrate and a drop to 500.  She is also on vascepa.  Due to recheck a fasting lipid panel in December Past Medical History:  Diagnosis Date  . Abnormal Pap smear   . Allergic rhinitis   . BV (bacterial vaginosis)   . GERD (gastroesophageal reflux disease)   . High cholesterol   . HSV-2 (herpes simplex virus 2) infection   . Hypertension   . Rheumatoid arthritis (HCC)   . Seizures (HCC)    dx as child with epilepsy but no seizures since 89 and on no meds  . Vaginal Pap smear, abnormal    Past Surgical History:  Procedure Laterality Date  . CERVICAL CONIZATION W/BX  12/20/2012   Procedure: CONIZATION CERVIX WITH BIOPSY;  Surgeon: Lazaro Arms, MD;  Location: [facility];  Service: Gynecology;  Laterality: N/A;  . HOLMIUM LASER APPLICATION  12/20/2012   Procedure: HOLMIUM LASER APPLICATION;  Surgeon: Lazaro Arms, MD;  Location: [facility];  Service: Gynecology;  Laterality: N/A;   Current Outpatient Medications on File Prior to Visit  Medication Sig Dispense Refill  . Biotin 1000 MCG tablet Take 1,000 mcg by mouth  daily.    . Cholecalciferol (VITAMIN D-3) 1000 UNITS CAPS Take 1 capsule by mouth daily.    Marland Kitchen escitalopram (LEXAPRO) 10 MG tablet TAKE 1 TABLET BY MOUTH ONCE DAILY 90 tablet 3  . fenofibrate 160 MG tablet Take 1 tablet (160 mg total) by mouth daily. 30 tablet 11  . fluticasone (FLONASE) 50 MCG/ACT nasal spray USE TWO SPRAY(S) IN EACH NOSTRIL ONCE DAILY 16 g 11  . folic acid (FOLVITE) 1 MG tablet Take 1 mg by mouth daily.    Marland Kitchen HUMIRA PEN 40 MG/0.8ML PNKT Every 2 weeks  0  . Icosapent Ethyl (VASCEPA) 1 g CAPS Take 2 capsules (2 g total) by mouth 2 (two) times daily. 120 capsule 11  . losartan-hydrochlorothiazide (HYZAAR) 50-12.5 MG tablet TAKE 1 TABLET BY MOUTH ONCE DAILY 90 tablet 3  . medroxyPROGESTERone (DEPO-PROVERA) 150 MG/ML injection INJECT 1 ML INTRAMUSCULARLY IN DOCTOR'S OFFICE EVERY 3 MONTHS. 1 mL 4  . nebivolol (BYSTOLIC) 10 MG tablet Take 1 tablet (10 mg total) by mouth daily. 90 tablet 3  . pantoprazole (PROTONIX) 40 MG tablet Take 1 tablet (40 mg total) by mouth 2 (two) times daily. 60 tablet 2   No current facility-administered medications on file prior to visit.    Allergies  Allergen Reactions  . Penicillins     Has patient had a PCN  reaction causing immediate rash, facial/tongue/throat swelling, SOB or lightheadedness with hypotension: Unknown Has patient had a PCN reaction causing severe rash involving mucus membranes or skin necrosis: Unknown Has patient had a PCN reaction that required hospitalization: Unknown Has patient had a PCN reaction occurring within the last 10 years: No If all of the above answers are "NO", then may proceed with Cephalosporin use.    Social History   Socioeconomic History  . Marital status: Single    Spouse name: Not on file  . Number of children: 2  . Years of education: Not on file  . Highest education level: Not on file  Occupational History  . 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 Financial resource strain: Not on file  . Food insecurity:    Worry: Not on file    Inability: Not on file  . Transportation needs:    Medical: Not on file    Non-medical: Not on file  Tobacco Use  . Smoking status: Current Every Day Smoker    Packs/day: 0.50    Years: 15.00    Pack years: 7.50    Types: Cigarettes  . Smokeless tobacco: Never Used  Substance and Sexual Activity  . Alcohol use: Yes    Comment: on the weekends drinks with friends  . Drug use: No  . Sexual activity: Yes    Birth control/protection: Injection    Comment: Depo Provera every 3 months  Lifestyle  . Physical activity:    Days per week: Not on file    Minutes per session: Not on file  . Stress: Not on file  Relationships  . Social connections:    Talks on phone: Not on file    Gets together: Not on file    Attends religious service: Not on file    Active member of club or organization: Not on file    Attends meetings of clubs or organizations: Not on file    Relationship status: Not on file  . Intimate partner violence:    Fear of current or ex partner: Not on file    Emotionally abused: Not on file    Physically abused: Not on file    Forced sexual activity: Not on file  Other Topics Concern  . Not on file  Social History Narrative  . Not on file   Family History  Problem Relation Age of Onset  . Hypertension Mother   . Hypertension Father   . Pancreatic cancer Paternal Uncle   . Colon cancer Neg Hx   . Esophageal cancer Neg Hx   . Inflammatory bowel disease Neg Hx   . Liver disease Neg Hx   . Rectal cancer Neg Hx   . Stomach cancer Neg Hx       Review of Systems  All other systems reviewed and are negative.      Objective:   Physical Exam  Constitutional: She is oriented to person, place, and time. She appears well-developed and well-nourished. No distress.  HENT:  Head: Normocephalic and atraumatic.  Right Ear: External ear normal.  Left Ear: External ear normal.   Nose: Nose normal.  Mouth/Throat: Oropharynx is clear and moist. No oropharyngeal exudate.  Eyes: Pupils are equal, round, and reactive to light. Conjunctivae are normal. Right eye exhibits no discharge. Left eye exhibits no discharge. No scleral icterus.  Neck: Neck supple. No JVD present. No tracheal deviation present. No thyromegaly present.  Cardiovascular:  Normal rate, regular rhythm and intact distal pulses. Exam reveals no gallop and no friction rub.  No murmur heard. Pulmonary/Chest: Effort normal and breath sounds normal. No stridor. No respiratory distress. She has no wheezes. She has no rales. She exhibits no tenderness.  Abdominal: Soft. Bowel sounds are normal. She exhibits no distension and no mass. There is no tenderness. There is no rebound and no guarding.  Musculoskeletal: She exhibits no edema, tenderness or deformity.  Lymphadenopathy:    She has no cervical adenopathy.  Neurological: She is alert and oriented to person, place, and time. She has normal reflexes. No cranial nerve deficit. She exhibits normal muscle tone. Coordination normal.  Skin: Skin is warm and dry. No rash noted. She is not diaphoretic. No erythema. No pallor.  Psychiatric: She has a normal mood and affect. Her behavior is normal. Judgment and thought content normal.  Vitals reviewed.         Assessment & Plan:  Migraine without aura and without status migrainosus, not intractable We discussed the risk and benefits of triptan's.  Patient would like to try Imitrex 50 mg p.o. x1 at the very first sign of a migraine.  She can repeat 1 pill and 2 hours of headache persist.  I recommended smoking cessation to reduce her risk of stroke.  I would like to recheck a CMP and fasting lipid panel in December to monitor the treatment of her hypertriglyceridemia.  Continue fenofibrate and Vascepa at present.

## 2018-11-21 ENCOUNTER — Emergency Department (HOSPITAL_COMMUNITY): Payer: BLUE CROSS/BLUE SHIELD

## 2018-11-21 ENCOUNTER — Encounter (HOSPITAL_COMMUNITY): Payer: Self-pay | Admitting: Emergency Medicine

## 2018-11-21 ENCOUNTER — Emergency Department (HOSPITAL_COMMUNITY)
Admission: EM | Admit: 2018-11-21 | Discharge: 2018-11-21 | Disposition: A | Discharge status: Home / Self Care | Payer: BLUE CROSS/BLUE SHIELD | Attending: Emergency Medicine | Admitting: Emergency Medicine

## 2018-11-21 ENCOUNTER — Other Ambulatory Visit: Payer: Self-pay

## 2018-11-21 DIAGNOSIS — I10 Essential (primary) hypertension: Secondary | ICD-10-CM | POA: Insufficient documentation

## 2018-11-21 DIAGNOSIS — R1011 Right upper quadrant pain: Secondary | ICD-10-CM | POA: Diagnosis present

## 2018-11-21 DIAGNOSIS — Z79899 Other long term (current) drug therapy: Secondary | ICD-10-CM | POA: Insufficient documentation

## 2018-11-21 DIAGNOSIS — F17210 Nicotine dependence, cigarettes, uncomplicated: Secondary | ICD-10-CM | POA: Diagnosis not present

## 2018-11-21 DIAGNOSIS — K859 Acute pancreatitis without necrosis or infection, unspecified: Secondary | ICD-10-CM | POA: Diagnosis not present

## 2018-11-21 DIAGNOSIS — E876 Hypokalemia: Secondary | ICD-10-CM | POA: Insufficient documentation

## 2018-11-21 HISTORY — DX: Other problems related to lifestyle: Z72.89

## 2018-11-21 HISTORY — DX: Alcohol use, unspecified, uncomplicated: F10.90

## 2018-11-21 HISTORY — DX: Other specified health status: Z78.9

## 2018-11-21 LAB — COMPREHENSIVE METABOLIC PANEL
ALT: 74 U/L — ABNORMAL HIGH (ref 0–44)
AST: 183 U/L — ABNORMAL HIGH (ref 15–41)
Albumin: 4.4 g/dL (ref 3.5–5.0)
Alkaline Phosphatase: 89 U/L (ref 38–126)
Anion gap: 11 (ref 5–15)
BUN: 16 mg/dL (ref 6–20)
CO2: 21 mmol/L — ABNORMAL LOW (ref 22–32)
Calcium: 9 mg/dL (ref 8.9–10.3)
Chloride: 105 mmol/L (ref 98–111)
Creatinine, Ser: 0.98 mg/dL (ref 0.44–1.00)
GFR calc Af Amer: >60 mL/min (ref >60)
GFR calc non Af Amer: >60 mL/min (ref >60)
Glucose, Bld: 188 mg/dL — ABNORMAL HIGH (ref 70–99)
Potassium: 2.9 mmol/L — ABNORMAL LOW (ref 3.5–5.1)
Sodium: 137 mmol/L (ref 135–145)
Total Bilirubin: 1.3 mg/dL — ABNORMAL HIGH (ref 0.3–1.2)
Total Protein: 8.1 g/dL (ref 6.5–8.1)

## 2018-11-21 LAB — CBC
HCT: 44.3 % (ref 36.0–46.0)
Hemoglobin: 14.7 g/dL (ref 12.0–15.0)
MCH: 32.1 pg (ref 26.0–34.0)
MCHC: 33.2 g/dL (ref 30.0–36.0)
MCV: 96.7 fL (ref 80.0–100.0)
Platelets: 230 10*3/uL (ref 150–400)
RBC: 4.58 MIL/uL (ref 3.87–5.11)
RDW: 13.2 % (ref 11.5–15.5)
WBC: 12.7 10*3/uL — ABNORMAL HIGH (ref 4.0–10.5)
nRBC: 0 % (ref 0.0–0.2)

## 2018-11-21 LAB — URINALYSIS, ROUTINE W REFLEX MICROSCOPIC
Bacteria, UA: NONE SEEN
Glucose, UA: NEGATIVE mg/dL
Hgb urine dipstick: NEGATIVE
Ketones, ur: 5 mg/dL — AB
Leukocytes, UA: NEGATIVE
Nitrite: NEGATIVE
Protein, ur: 100 mg/dL — AB
Specific Gravity, Urine: 1.032 — ABNORMAL HIGH (ref 1.005–1.030)
pH: 5 (ref 5.0–8.0)

## 2018-11-21 LAB — LIPASE, BLOOD: Lipase: 367 U/L — ABNORMAL HIGH (ref 11–51)

## 2018-11-21 LAB — HCG, QUANTITATIVE, PREGNANCY: hCG, Beta Chain, Quant, S: <1 m[IU]/mL (ref <5)

## 2018-11-21 IMAGING — CT CT ABD-PELV W/ CM
2 of 4 series · 16 of 46 positions shown, 18 images · IV contrast (Isovue)
Comparison: CT [DATE]

CLINICAL DATA: Abdominal pain with nausea and vomiting

EXAM:
CT ABDOMEN AND PELVIS WITH CONTRAST
TECHNIQUE: Multidetector CT imaging of the abdomen and pelvis was performed
using the standard protocol following bolus administration of
intravenous contrast.
CONTRAST:  100mL [1K] IOPAMIDOL ([1K]) INJECTION 61%

[Series 2: axial st · axial · 0.85mm/px · z∈[+726,+1146]mm · 13 of 94 slices shown, 15 images]
[im 5/94  soft-tissue]
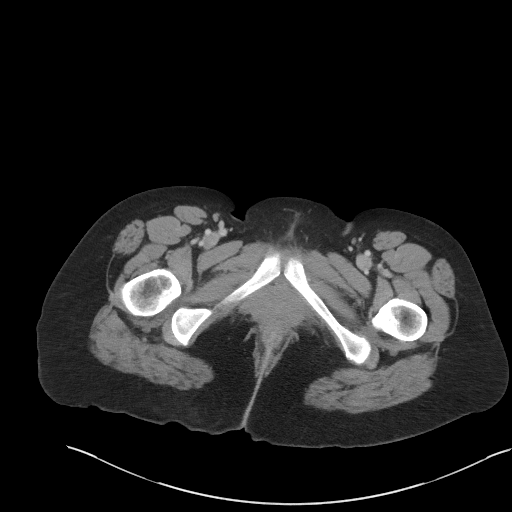
[im 5/94  bone]
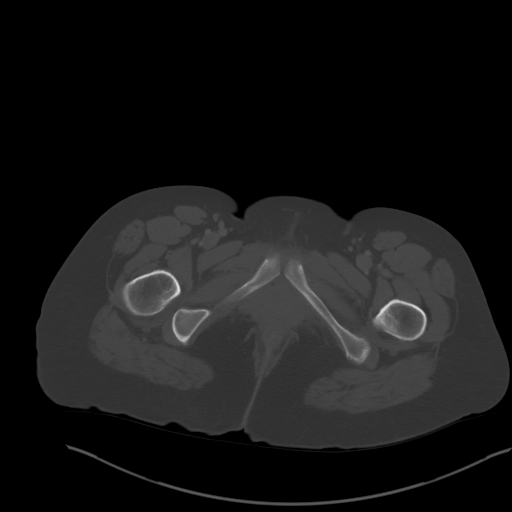
[im 13/94  soft-tissue]
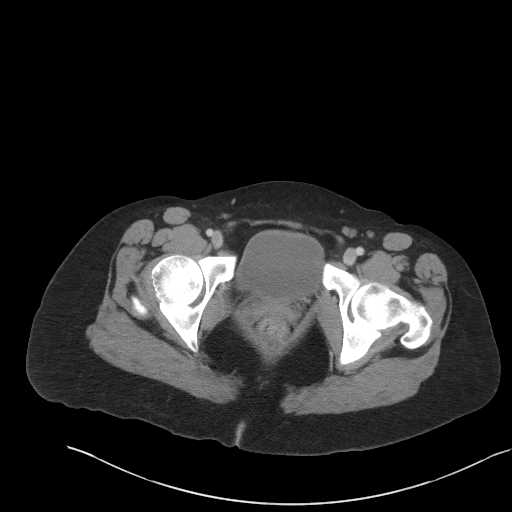
[im 22/94  soft-tissue]
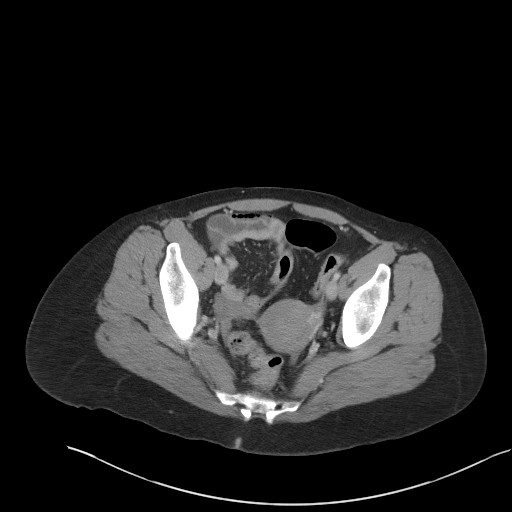
[im 26/94  soft-tissue]
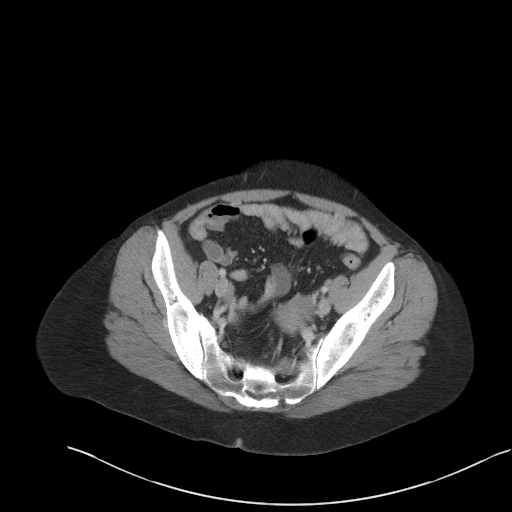
[im 34/94  soft-tissue]
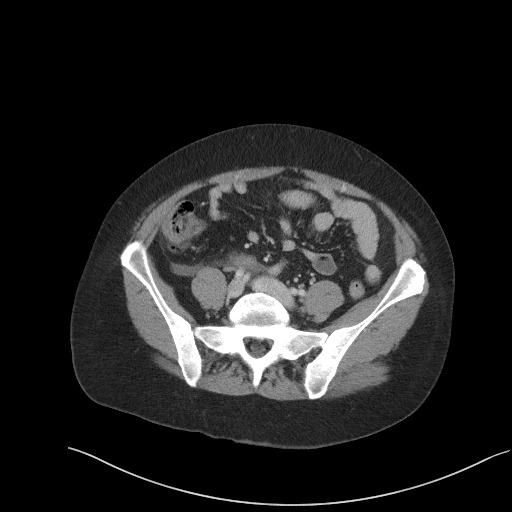
[im 39/94  soft-tissue]
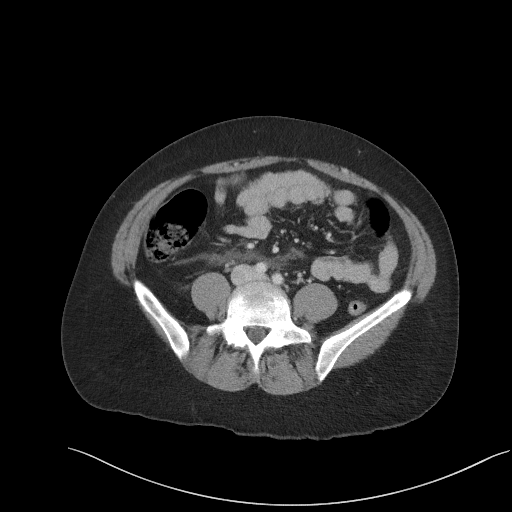
[im 47/94  soft-tissue]
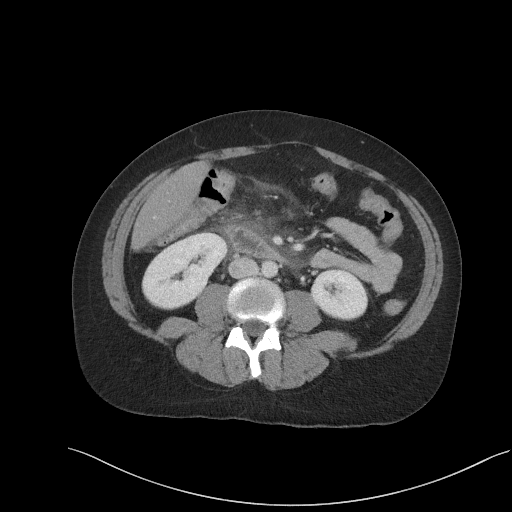
[im 55/94  soft-tissue]
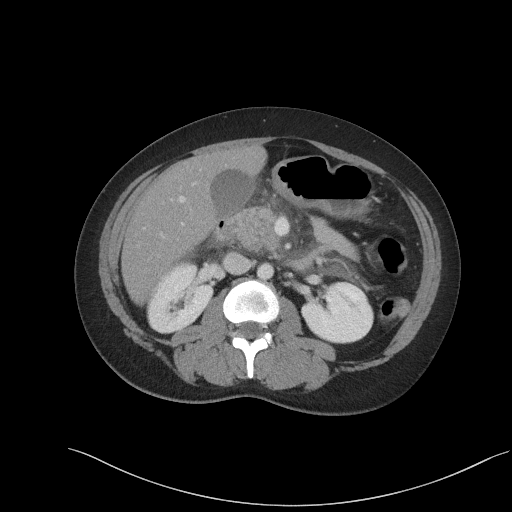
[im 60/94  soft-tissue]
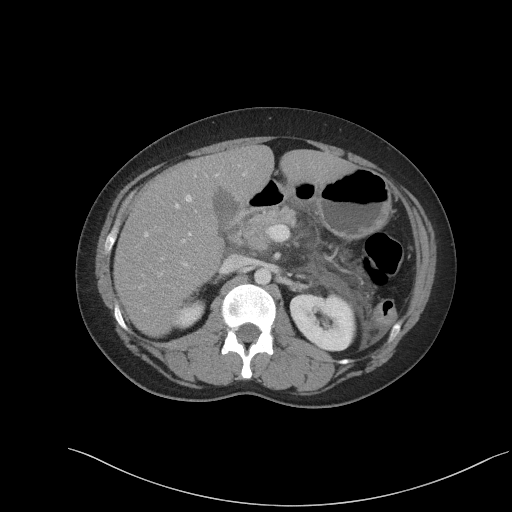
[im 60/94  bone]
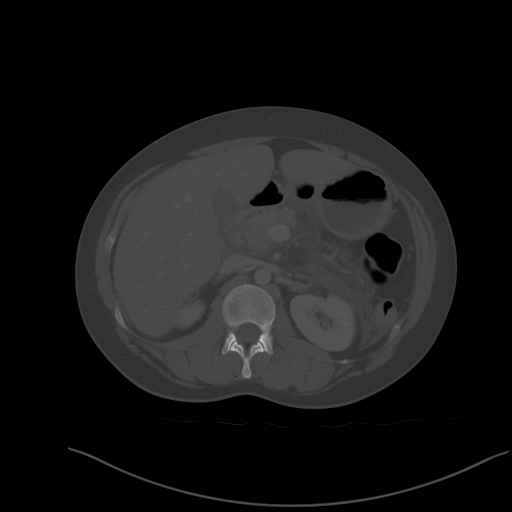
[im 68/94  soft-tissue]
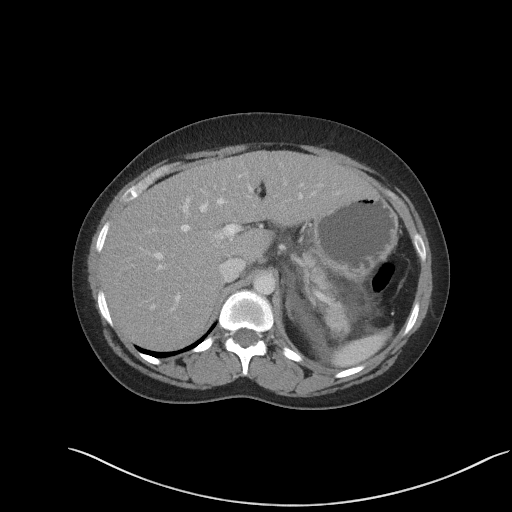
[im 72/94  soft-tissue]
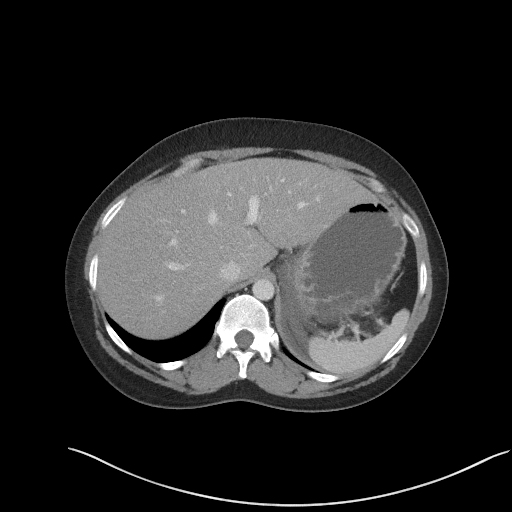
[im 81/94  soft-tissue]
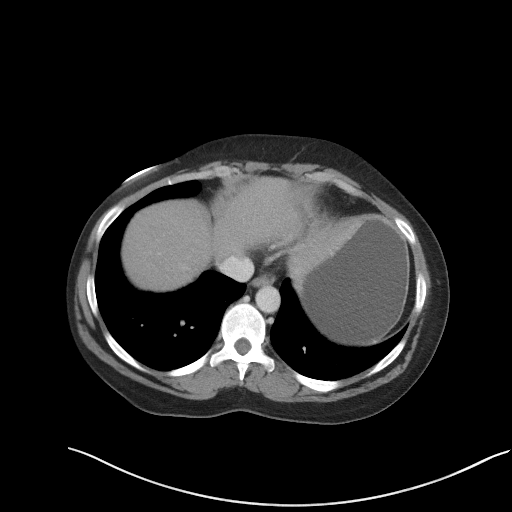
[im 89/94  soft-tissue]
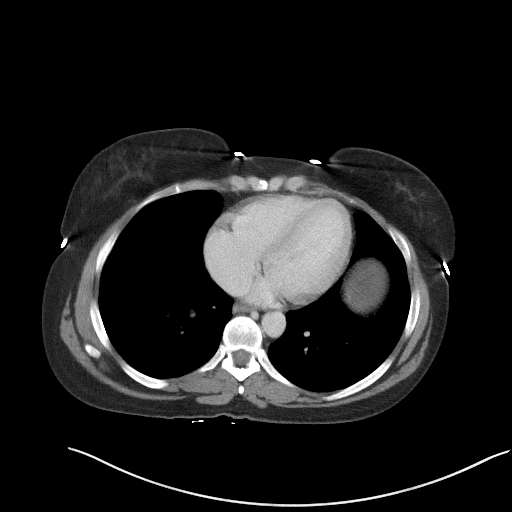

[Series 5: coronal st · coronal · 0.75mm/px · 3 of 91 slices shown]
[im 31/91  soft-tissue]
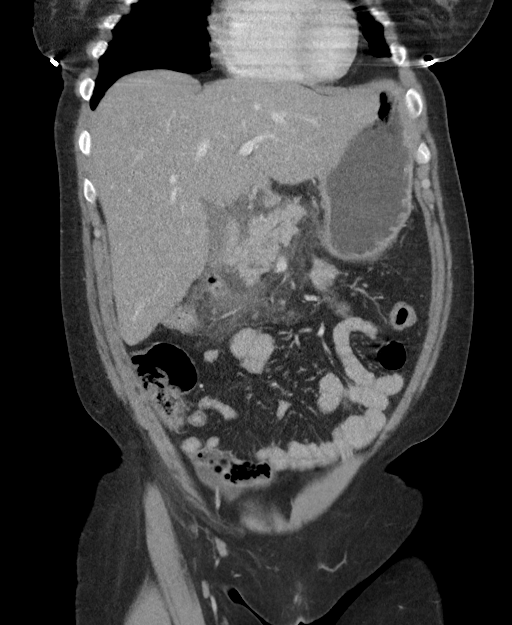
[im 41/91  soft-tissue]
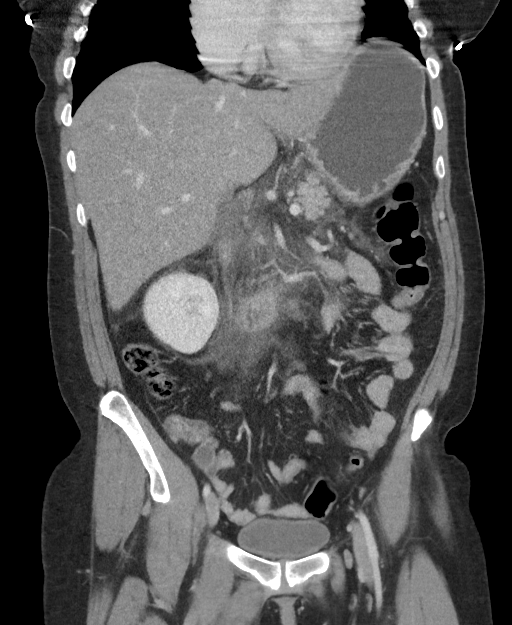
[im 51/91  soft-tissue]
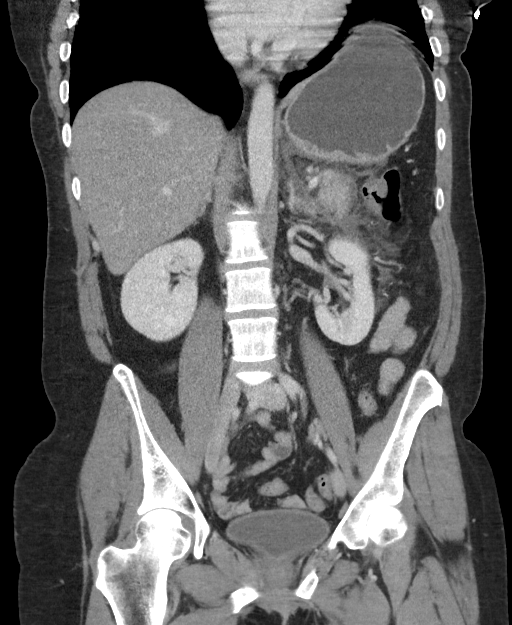

[16 of 46 positions shown; findings below may reference images not displayed]

FINDINGS: Lower chest: Lung bases demonstrate no acute consolidation or
effusion. The heart size is normal

Hepatobiliary: No focal liver abnormality is seen. No gallstones,
gallbladder wall thickening, or biliary dilatation.

Pancreas: Moderate fluid and edema surrounding the pancreas
consistent with acute pancreatitis. No organized fluid collection.
Homogeneous enhancement of the pancreas.

Spleen: Normal in size without focal abnormality.

Adrenals/Urinary Tract: Adrenal glands are unremarkable. Kidneys are
normal, without renal calculi, focal lesion, or hydronephrosis.
Bladder is unremarkable.

Stomach/Bowel: The stomach is nonenlarged. Fluid and edema
surrounding the proximal duodenum. Negative appendix. No dilated
small bowel. No bowel wall thickening.

Vascular/Lymphatic: Normal enhancement of the portal and splenic
veins. Nonaneurysmal aorta. No significantly enlarged lymph nodes

Reproductive: Uterus and bilateral adnexa are unremarkable.

Other: No free air.  Small free fluid in the pelvis.

Musculoskeletal: No acute or significant osseous findings.
IMPRESSION: 1. Findings consistent with acute pancreatitis. No necrosis. No
organized fluid collections.
2. Slightly indistinct appearance of the proximal duodenum, could be
due to associated duodenitis
3. Small amount of free fluid in the abdomen and pelvis.

## 2018-11-21 MED ORDER — OXYCODONE HCL 5 MG PO TABS
5.0000 mg | ORAL_TABLET | Freq: Once | ORAL | Status: AC
  Administered 2018-11-21: 5 mg via ORAL
  Filled 2018-11-21: qty 1

## 2018-11-21 MED ORDER — POTASSIUM CHLORIDE 10 MEQ/100ML IV SOLN
10.0000 meq | Freq: Once | INTRAVENOUS | Status: AC
  Administered 2018-11-21: 10 meq via INTRAVENOUS
  Filled 2018-11-21: qty 100

## 2018-11-21 MED ORDER — ONDANSETRON 4 MG PO TBDP: 4.0000 mg | ORAL_TABLET | Freq: Three times a day (TID) | ORAL | 0 refills | Status: DC | PRN

## 2018-11-21 MED ORDER — MORPHINE SULFATE (PF) 4 MG/ML IV SOLN
4.0000 mg | INTRAVENOUS | Status: AC | PRN
  Administered 2018-11-21 (×2): 4 mg via INTRAVENOUS
  Filled 2018-11-21 (×2): qty 1

## 2018-11-21 MED ORDER — OXYCODONE HCL 5 MG PO TABS: 5.0000 mg | ORAL_TABLET | Freq: Four times a day (QID) | ORAL | 0 refills | Status: AC | PRN

## 2018-11-21 MED ORDER — ONDANSETRON HCL 4 MG/2ML IJ SOLN
4.0000 mg | INTRAMUSCULAR | Status: DC | PRN
  Administered 2018-11-21: 4 mg via INTRAVENOUS
  Filled 2018-11-21: qty 2

## 2018-11-21 MED ORDER — SODIUM CHLORIDE 0.9 % IV SOLN
INTRAVENOUS | Status: DC
  Administered 2018-11-21: 20:00:00 via INTRAVENOUS

## 2018-11-21 MED ORDER — IOPAMIDOL (ISOVUE-300) INJECTION 61%
100.0000 mL | Freq: Once | INTRAVENOUS | Status: AC | PRN
  Administered 2018-11-21: 100 mL via INTRAVENOUS

## 2018-11-21 MED ORDER — ONDANSETRON 4 MG PO TBDP
4.0000 mg | ORAL_TABLET | Freq: Once | ORAL | Status: AC
  Administered 2018-11-21: 4 mg via ORAL
  Filled 2018-11-21: qty 1

## 2018-11-21 MED ORDER — POTASSIUM CHLORIDE CRYS ER 20 MEQ PO TBCR
40.0000 meq | EXTENDED_RELEASE_TABLET | Freq: Once | ORAL | Status: AC
  Administered 2018-11-21: 40 meq via ORAL
  Filled 2018-11-21: qty 2

## 2018-11-21 MED ORDER — PANTOPRAZOLE SODIUM 40 MG IV SOLR
40.0000 mg | Freq: Once | INTRAVENOUS | Status: AC
  Administered 2018-11-21: 40 mg via INTRAVENOUS
  Filled 2018-11-21: qty 40

## 2018-11-21 NOTE — Discharge Instructions (Signed)
Take the prescriptions as directed.  Eat a liquid diet for the next few days, then advance to a bland diet. Slowly further advance to a low fat diet as slowly as you can tolerate it. Avoid tylenol and tylenol containing products, as well as alcohol, until you are seen in follow up. Call your regular medical doctor and your GI doctor tomorrow to schedule a follow up appointment this week.  Return to the Emergency Department immediately sooner if worsening.

## 2018-11-21 NOTE — ED Provider Notes (Signed)
[facility] EMERGENCY DEPARTMENT Provider Note   CSN: [id number] Arrival date & time: 11/21/18  1618     History   Chief Complaint Chief Complaint  Patient presents with  . Abdominal Pain    HPI Samantha Rosales is a 37 y.o. female.  HPI  Pt was seen at 1935.  Per pt, c/o gradual onset and persistence of constant generalized abd "pain" for the past 3 days.  Has been associated with multiple intermittent episodes of N/V since yesterday.  Describes the abd pain as "cramping." Pt states she has "not eaten anything in 4 days" because of nausea and abd pain. Pt states she drinks etoh "more days than not." Denies diarrhea, no fevers, no back pain, no rash, no CP/SOB, no black or blood in stools or emesis.      Past Medical History:  Diagnosis Date  . Abnormal Pap smear   . Alcohol use   . Allergic rhinitis   . BV (bacterial vaginosis)   . GERD (gastroesophageal reflux disease)   . High cholesterol   . HSV-2 (herpes simplex virus 2) infection   . Hypertension   . Rheumatoid arthritis (HCC)   . Seizures (HCC)    dx as child with epilepsy but no seizures since 89 and on no meds  . Vaginal Pap smear, abnormal     Patient Active Problem List   Diagnosis Date Noted  . Abnormal LFTs 09/05/2018  . Elevated lipase 09/05/2018  . History of methotrexate therapy 09/05/2018  . High triglycerides 09/05/2018  . Elevated liver enzymes 07/01/2018  . Essential hypertension 07/01/2018  . Encounter for well woman exam with routine gynecological exam 07/01/2018  . History of abnormal cervical Pap smear 06/26/2017  . Encounter for surveillance of injectable contraceptive 06/26/2017  . Encounter for gynecological examination with Papanicolaou smear of cervix 06/26/2017  . Arthritis, rheumatic, acute or subacute 11/18/2013  . Urticaria, idiopathic 08/24/2013  . HSV-2 (herpes simplex virus 2) infection 05/29/2013  . HPV (human papilloma virus) infection 05/29/2013    Past Surgical History:   Procedure Laterality Date  . CERVICAL CONIZATION W/BX  12/20/2012   Procedure: CONIZATION CERVIX WITH BIOPSY;  Surgeon: Lazaro Arms, MD;  Location: [facility];  Service: Gynecology;  Laterality: N/A;  . HOLMIUM LASER APPLICATION  12/20/2012   Procedure: HOLMIUM LASER APPLICATION;  Surgeon: Lazaro Arms, MD;  Location: [facility];  Service: Gynecology;  Laterality: N/A;     OB History    Gravida  4   Para  2   Term  2   Preterm      AB  2   Living  2     SAB  1   TAB  1   Ectopic      Multiple      Live Births  2            Home Medications    Prior to Admission medications   Medication Sig Start Date End Date Taking? Authorizing Provider  Biotin 1000 MCG tablet Take 1,000 mcg by mouth daily.    [provider]  Cholecalciferol (VITAMIN D-3) 1000 UNITS CAPS Take 1 capsule by mouth daily.    [provider]  escitalopram (LEXAPRO) 10 MG tablet TAKE 1 TABLET BY MOUTH ONCE DAILY 08/28/18   Donita Brooks, MD  fenofibrate 160 MG tablet Take 1 tablet (160 mg total) by mouth daily. 09/05/18   Donita Brooks, MD  fluticasone (FLONASE) 50 MCG/ACT nasal spray USE  TWO SPRAY(S) IN EACH NOSTRIL ONCE DAILY 06/21/17   Donita Brooks, MD  folic acid (FOLVITE) 1 MG tablet Take 1 mg by mouth daily.    [provider]  HUMIRA PEN 40 MG/0.8ML PNKT Every 2 weeks 08/08/17   [provider]  Icosapent Ethyl (VASCEPA) 1 g CAPS Take 2 capsules (2 g total) by mouth 2 (two) times daily. 09/05/18   Donita Brooks, MD  losartan-hydrochlorothiazide (HYZAAR) 50-12.5 MG tablet TAKE 1 TABLET BY MOUTH ONCE DAILY 06/24/18   Donita Brooks, MD  medroxyPROGESTERone (DEPO-PROVERA) 150 MG/ML injection INJECT 1 ML INTRAMUSCULARLY IN DOCTOR'S OFFICE EVERY 3 MONTHS. 07/01/18   Adline Potter, NP  nebivolol (BYSTOLIC) 10 MG tablet Take 1 tablet (10 mg total) by mouth daily. 03/27/18   Donita Brooks, MD  pantoprazole (PROTONIX) 40 MG tablet Take 1 tablet (40 mg  total) by mouth 2 (two) times daily. 09/03/18   Mansouraty, Netty Starring., MD  SUMAtriptan (IMITREX) 50 MG tablet Take 1 tablet (50 mg total) by mouth every 2 (two) hours as needed for migraine. May repeat in 2 hours if headache persists or recurs. 10/25/18   Donita Brooks, MD    Family History Family History  Problem Relation Age of Onset  . Hypertension Mother   . Hypertension Father   . Pancreatic cancer Paternal Uncle   . Colon cancer Neg Hx   . Esophageal cancer Neg Hx   . Inflammatory bowel disease Neg Hx   . Liver disease Neg Hx   . Rectal cancer Neg Hx   . Stomach cancer Neg Hx     Social History Social History   Tobacco Use  . Smoking status: Current Every Day Smoker    Packs/day: 0.50    Years: 15.00    Pack years: 7.50    Types: Cigarettes  . Smokeless tobacco: Never Used  Substance Use Topics  . Alcohol use: Yes    Comment: on the weekends drinks with friends  . Drug use: No     Allergies   Penicillins   Review of Systems Review of Systems ROS: Statement: All systems negative except as marked or noted in the HPI; Constitutional: Negative for fever and chills. ; ; Eyes: Negative for eye pain, redness and discharge. ; ; ENMT: Negative for ear pain, hoarseness, nasal congestion, sinus pressure and sore throat. ; ; Cardiovascular: Negative for chest pain, palpitations, diaphoresis, dyspnea and peripheral edema. ; ; Respiratory: Negative for cough, wheezing and stridor. ; ; Gastrointestinal: +N/V, abd pain. Negative for diarrhea, blood in stool, hematemesis, jaundice and rectal bleeding. . ; ; Genitourinary: Negative for dysuria, flank pain and hematuria. ; ; Musculoskeletal: Negative for back pain and neck pain. Negative for swelling and trauma.; ; Skin: Negative for pruritus, rash, abrasions, blisters, bruising and skin lesion.; ; Neuro: Negative for headache, lightheadedness and neck stiffness. Negative for weakness, altered level of consciousness, altered mental  status, extremity weakness, paresthesias, involuntary movement, seizure and syncope.       Physical Exam Updated Vital Signs BP 91/74 (BP Location: Right Arm)   Pulse 65   Temp (!) 97 F (36.1 C) (Temporal)   Resp 20   Ht 5\' 8"  (1.727 m)   Wt 78 kg   SpO2 100%   BMI 26.15 kg/m   Physical Exam 1941: Physical examination:  Nursing notes reviewed; Vital signs and O2 SAT reviewed;  Constitutional: Well developed, Well nourished, Well hydrated, In no acute distress; Head:  Normocephalic, atraumatic;  Eyes: EOMI, PERRL, No scleral icterus; ENMT: Mouth and pharynx normal, Mucous membranes moist; Neck: Supple, Full range of motion, No lymphadenopathy; Cardiovascular: Regular rate and rhythm, No gallop; Respiratory: Breath sounds clear & equal bilaterally, No wheezes.  Speaking full sentences with ease, Normal respiratory effort/excursion; Chest: Nontender, Movement normal; Abdomen: Soft, +mid-epigastric and RUQ tenderness to palp. No rebound or guarding. Nondistended, Normal bowel sounds; Genitourinary: No CVA tenderness; Extremities: Peripheral pulses normal, No tenderness, No edema, No calf edema or asymmetry.; Neuro: AA&Ox3, Major CN grossly intact.  Speech clear. No gross focal motor or sensory deficits in extremities.; Skin: Color normal, Warm, Dry.   ED Treatments / Results  Labs (all labs ordered are listed, but only abnormal results are displayed)   EKG None  Radiology   Procedures Procedures (including critical care time)  Medications Ordered in ED Medications  0.9 %  sodium chloride infusion ( Intravenous New Bag/Given 11/21/18 1957)  morphine 4 MG/ML injection 4 mg (4 mg Intravenous Given 11/21/18 1956)  ondansetron (ZOFRAN) injection 4 mg (4 mg Intravenous Given 11/21/18 1956)  ondansetron (ZOFRAN-ODT) disintegrating tablet 4 mg (4 mg Oral Given 11/21/18 1803)     Initial Impression / Assessment and Plan / ED Course  I have reviewed the triage vital signs and the  nursing notes.  Pertinent labs & imaging results that were available during my care of the patient were reviewed by me and considered in my medical decision making (see chart for details).  MDM Reviewed: previous chart, nursing note and vitals Reviewed previous: labs Interpretation: labs and CT scan   Results for orders placed or performed during the hospital encounter of 11/21/18  Lipase, blood  Result Value Ref Range   Lipase 367 (H) 11 - 51 U/L  Comprehensive metabolic panel  Result Value Ref Range   Sodium 137 135 - 145 mmol/L   Potassium 2.9 (L) 3.5 - 5.1 mmol/L   Chloride 105 98 - 111 mmol/L   CO2 21 (L) 22 - 32 mmol/L   Glucose, Bld 188 (H) 70 - 99 mg/dL   BUN 16 6 - 20 mg/dL   Creatinine, Ser 8.460.98 0.44 - 1.00 mg/dL   Calcium 9.0 8.9 - 96.210.3 mg/dL   Total Protein 8.1 6.5 - 8.1 g/dL   Albumin 4.4 3.5 - 5.0 g/dL   AST 952183 (H) 15 - 41 U/L   ALT 74 (H) 0 - 44 U/L   Alkaline Phosphatase 89 38 - 126 U/L   Total Bilirubin 1.3 (H) 0.3 - 1.2 mg/dL   GFR calc non Af Amer >60 >60 mL/min   GFR calc Af Amer >60 >60 mL/min   Anion gap 11 5 - 15  CBC  Result Value Ref Range   WBC 12.7 (H) 4.0 - 10.5 K/uL   RBC 4.58 3.87 - 5.11 MIL/uL   Hemoglobin 14.7 12.0 - 15.0 g/dL   HCT 84.144.3 32.436.0 - 40.146.0 %   MCV 96.7 80.0 - 100.0 fL   MCH 32.1 26.0 - 34.0 pg   MCHC 33.2 30.0 - 36.0 g/dL   RDW 02.713.2 25.311.5 - 66.415.5 %   Platelets 230 150 - 400 K/uL   nRBC 0.0 0.0 - 0.2 %  Urinalysis, Routine w reflex microscopic  Result Value Ref Range   Color, Urine AMBER (A) YELLOW   APPearance CLOUDY (A) CLEAR   Specific Gravity, Urine 1.032 (H) 1.005 - 1.030   pH 5.0 5.0 - 8.0   Glucose, UA NEGATIVE NEGATIVE mg/dL   Hgb urine  dipstick NEGATIVE NEGATIVE   Bilirubin Urine SMALL (A) NEGATIVE   Ketones, ur 5 (A) NEGATIVE mg/dL   Protein, ur 474 (A) NEGATIVE mg/dL   Nitrite NEGATIVE NEGATIVE   Leukocytes, UA NEGATIVE NEGATIVE   RBC / HPF 0-5 0 - 5 RBC/hpf   WBC, UA 0-5 0 - 5 WBC/hpf   Bacteria, UA NONE  SEEN NONE SEEN   Squamous Epithelial / LPF 0-5 0 - 5   Mucus PRESENT    Hyaline Casts, UA PRESENT   hCG, quantitative, pregnancy  Result Value Ref Range   hCG, Beta Chain, Quant, S <1 <5 mIU/mL    Ct Abdomen Pelvis W Contrast Result Date: 11/21/2018 CLINICAL DATA:  Abdominal pain with nausea and vomiting EXAM: CT ABDOMEN AND PELVIS WITH CONTRAST TECHNIQUE: Multidetector CT imaging of the abdomen and pelvis was performed using the standard protocol following bolus administration of intravenous contrast. CONTRAST:  ISOVUE-300 IOPAMIDOL (ISOVUE-300) INJECTION 61% COMPARISON:  CT 09/17/2018 FINDINGS: Lower chest: Lung bases demonstrate no acute consolidation or effusion. The heart size is normal Hepatobiliary: No focal liver abnormality is seen. No gallstones, gallbladder wall thickening, or biliary dilatation. Pancreas: Moderate fluid and edema surrounding the pancreas consistent with acute pancreatitis. No organized fluid collection. Homogeneous enhancement of the pancreas. Spleen: Normal in size without focal abnormality. Adrenals/Urinary Tract: Adrenal glands are unremarkable. Kidneys are normal, without renal calculi, focal lesion, or hydronephrosis. Bladder is unremarkable. Stomach/Bowel: The stomach is nonenlarged. Fluid and edema surrounding the proximal duodenum. Negative appendix. No dilated small bowel. No bowel wall thickening. Vascular/Lymphatic: Normal enhancement of the portal and splenic veins. Nonaneurysmal aorta. No significantly enlarged lymph nodes Reproductive: Uterus and bilateral adnexa are unremarkable. Other: No free air.  Small free fluid in the pelvis. Musculoskeletal: No acute or significant osseous findings. IMPRESSION: 1. Findings consistent with acute pancreatitis. No necrosis. No organized fluid collections. 2. Slightly indistinct appearance of the proximal duodenum, could be due to associated duodenitis 3. Small amount of free fluid in the abdomen and pelvis.  Electronically Signed   By: Jasmine Pang M.D.   On: 11/21/2018 22:01     Results for ANNAIS, CRAFTS (MRN [id number]) as of 11/21/2018 20:47  Ref. Range 06/14/2018 09:08 06/15/2018 00:23 09/03/2018 17:10 11/21/2018 18:24  Lipase Latest Ref Range: 11 - 51 U/L 182 (H) 131 (H) 135 (H) 367 (H)   Results for LYNNET, HEFLEY (MRN [id number]) as of 11/21/2018 20:47  Ref. Range 06/14/2018 09:08 06/14/2018 23:02 07/02/2018 16:29 09/03/2018 17:10 09/27/2018 15:10 09/27/2018 15:10 11/21/2018 18:24  AST Latest Ref Range: 15 - 41 U/L 1,409 (H) 915 (H) 142 (H) 85 (H) 43 (H) 44 (H) 183 (H)  ALT Latest Ref Range: 0 - 44 U/L 883 (H) 711 (H) 97 (H) 57 (H) 28 29 74 (H)  Total Bilirubin Latest Ref Range: 0.3 - 1.2 mg/dL 3.2 (H) 2.5 (H) 0.6 0.6 0.4 0.4 1.3 (H)    2215:  Potasium repleted IV and PO. IV protonix given; pt states she has rx for same at home. Pt has hx transaminitis and pancreatitis, and has f/u with GI Dr Meridee Score for same. Offered pt admission; pt refuses several times, stating she can manage at home and has GI MD to f/u with. Return precautions given. Dx and testing d/w pt and family.  Questions answered.  Verb understanding, agreeable to d/c home with outpt f/u.      Final Clinical Impressions(s) / ED Diagnoses   Final diagnoses:  None    ED Discharge Orders  None       Samuel Jester, DO 11/25/18 [phone]

## 2018-11-21 NOTE — ED Notes (Signed)
Pt taken to CT.

## 2018-11-21 NOTE — ED Triage Notes (Signed)
Patient complains of abdominal pain, nausea, and vomiting. Patient states she has no eaten anything in 4 days due to nausea.

## 2018-12-15 ENCOUNTER — Other Ambulatory Visit: Payer: Self-pay | Admitting: Gastroenterology

## 2018-12-24 ENCOUNTER — Other Ambulatory Visit: Payer: Self-pay | Admitting: *Deleted

## 2018-12-24 MED ORDER — HYDROCHLOROTHIAZIDE 12.5 MG PO CAPS: 12.5000 mg | ORAL_CAPSULE | Freq: Every day | ORAL | 3 refills | Status: DC

## 2018-12-24 MED ORDER — LOSARTAN POTASSIUM 50 MG PO TABS: 50.0000 mg | ORAL_TABLET | Freq: Every day | ORAL | 3 refills | Status: DC

## 2019-01-17 ENCOUNTER — Encounter: Payer: Self-pay | Admitting: *Deleted

## 2019-01-17 ENCOUNTER — Ambulatory Visit (INDEPENDENT_AMBULATORY_CARE_PROVIDER_SITE_OTHER): Payer: BLUE CROSS/BLUE SHIELD | Admitting: *Deleted

## 2019-01-17 DIAGNOSIS — Z3202 Encounter for pregnancy test, result negative: Secondary | ICD-10-CM | POA: Diagnosis not present

## 2019-01-17 DIAGNOSIS — Z308 Encounter for other contraceptive management: Secondary | ICD-10-CM

## 2019-01-17 DIAGNOSIS — Z3042 Encounter for surveillance of injectable contraceptive: Secondary | ICD-10-CM

## 2019-01-17 LAB — POCT URINE PREGNANCY: Preg Test, Ur: NEGATIVE

## 2019-01-17 MED ORDER — MEDROXYPROGESTERONE ACETATE 150 MG/ML IM SUSP
150.0000 mg | Freq: Once | INTRAMUSCULAR | Status: AC
  Administered 2019-01-17: 150 mg via INTRAMUSCULAR

## 2019-01-17 NOTE — Progress Notes (Signed)
Pt here for Depo. Pt received shot in right deltoid. Pt tolerated shot well. Return in 12 weeks for next shot. JSY 

## 2019-04-11 ENCOUNTER — Ambulatory Visit (INDEPENDENT_AMBULATORY_CARE_PROVIDER_SITE_OTHER): Payer: BC Managed Care – PPO | Admitting: *Deleted

## 2019-04-11 ENCOUNTER — Encounter: Payer: Self-pay | Admitting: *Deleted

## 2019-04-11 ENCOUNTER — Other Ambulatory Visit: Payer: Self-pay

## 2019-04-11 DIAGNOSIS — Z3042 Encounter for surveillance of injectable contraceptive: Secondary | ICD-10-CM

## 2019-04-11 MED ORDER — MEDROXYPROGESTERONE ACETATE 150 MG/ML IM SUSP
150.0000 mg | Freq: Once | INTRAMUSCULAR | Status: AC
  Administered 2019-04-11: 11:00:00 150 mg via INTRAMUSCULAR

## 2019-04-11 NOTE — Progress Notes (Signed)
Depo Provera 150mg IM given in left deltoid with no complications. Pt to return in 12 weeks for next injection.  

## 2019-05-25 ENCOUNTER — Other Ambulatory Visit: Payer: Self-pay | Admitting: *Deleted

## 2019-05-25 DIAGNOSIS — Z20822 Contact with and (suspected) exposure to covid-19: Secondary | ICD-10-CM

## 2019-06-01 LAB — NOVEL CORONAVIRUS, NAA: SARS-CoV-2, NAA: NOT DETECTED

## 2019-06-03 ENCOUNTER — Other Ambulatory Visit: Payer: Self-pay

## 2019-06-03 ENCOUNTER — Telehealth: Payer: Self-pay | Admitting: Family Medicine

## 2019-06-03 ENCOUNTER — Emergency Department (HOSPITAL_COMMUNITY)
Admission: EM | Admit: 2019-06-03 | Discharge: 2019-06-04 | Disposition: A | Discharge status: Home / Self Care | Payer: BC Managed Care – PPO | Attending: Emergency Medicine | Admitting: Emergency Medicine

## 2019-06-03 ENCOUNTER — Encounter (HOSPITAL_COMMUNITY): Payer: Self-pay | Admitting: Emergency Medicine

## 2019-06-03 DIAGNOSIS — R739 Hyperglycemia, unspecified: Secondary | ICD-10-CM

## 2019-06-03 DIAGNOSIS — R1012 Left upper quadrant pain: Secondary | ICD-10-CM | POA: Diagnosis present

## 2019-06-03 DIAGNOSIS — F17210 Nicotine dependence, cigarettes, uncomplicated: Secondary | ICD-10-CM | POA: Insufficient documentation

## 2019-06-03 DIAGNOSIS — Z79899 Other long term (current) drug therapy: Secondary | ICD-10-CM | POA: Diagnosis not present

## 2019-06-03 DIAGNOSIS — K859 Acute pancreatitis without necrosis or infection, unspecified: Secondary | ICD-10-CM | POA: Insufficient documentation

## 2019-06-03 DIAGNOSIS — I10 Essential (primary) hypertension: Secondary | ICD-10-CM | POA: Diagnosis not present

## 2019-06-03 MED ORDER — SODIUM CHLORIDE 0.9% FLUSH: 3.0000 mL | Freq: Once | INTRAVENOUS | Status: DC

## 2019-06-03 NOTE — Telephone Encounter (Signed)
Pt called and states that she thinks she is having trapped gas in her upper back/lower rib area and feels like she is also constipated. Pt denies and SOB, Chest pain or left arm pain. Informed pt to go to er if any of the mentioned symptoms start or hers gets worse. Pt verbalized understanding. Suggested pt try simethicone for the gas and magnesium citrate for the constipation. She could also try senokot for overnight relief.

## 2019-06-03 NOTE — ED Triage Notes (Signed)
Pt reports abdominal pain along with nausea and vomiting that has been ongoing today.

## 2019-06-04 LAB — CBC
HCT: 36.8 % (ref 36.0–46.0)
Hemoglobin: 12.9 g/dL (ref 12.0–15.0)
MCH: 34.1 pg — ABNORMAL HIGH (ref 26.0–34.0)
MCHC: 35.1 g/dL (ref 30.0–36.0)
MCV: 97.4 fL (ref 80.0–100.0)
Platelets: 146 10*3/uL — ABNORMAL LOW (ref 150–400)
RBC: 3.78 MIL/uL — ABNORMAL LOW (ref 3.87–5.11)
RDW: 14.3 % (ref 11.5–15.5)
WBC: 12.2 10*3/uL — ABNORMAL HIGH (ref 4.0–10.5)
nRBC: 0 % (ref 0.0–0.2)

## 2019-06-04 LAB — COMPREHENSIVE METABOLIC PANEL
ALT: 34 U/L (ref 0–44)
AST: 70 U/L — ABNORMAL HIGH (ref 15–41)
Albumin: 4.5 g/dL (ref 3.5–5.0)
Alkaline Phosphatase: 98 U/L (ref 38–126)
Anion gap: 14 (ref 5–15)
BUN: 6 mg/dL (ref 6–20)
CO2: 21 mmol/L — ABNORMAL LOW (ref 22–32)
Calcium: 9.3 mg/dL (ref 8.9–10.3)
Chloride: 101 mmol/L (ref 98–111)
Creatinine, Ser: 0.94 mg/dL (ref 0.44–1.00)
GFR calc Af Amer: >60 mL/min (ref >60)
GFR calc non Af Amer: >60 mL/min (ref >60)
Glucose, Bld: 238 mg/dL — ABNORMAL HIGH (ref 70–99)
Potassium: 3.1 mmol/L — ABNORMAL LOW (ref 3.5–5.1)
Sodium: 136 mmol/L (ref 135–145)
Total Bilirubin: 1.4 mg/dL — ABNORMAL HIGH (ref 0.3–1.2)
Total Protein: 7.9 g/dL (ref 6.5–8.1)

## 2019-06-04 LAB — URINALYSIS, ROUTINE W REFLEX MICROSCOPIC
Glucose, UA: 150 mg/dL — AB
Hgb urine dipstick: NEGATIVE
Ketones, ur: 5 mg/dL — AB
Leukocytes,Ua: NEGATIVE
Nitrite: NEGATIVE
Protein, ur: 100 mg/dL — AB
Specific Gravity, Urine: 1.029 (ref 1.005–1.030)
pH: 5 (ref 5.0–8.0)

## 2019-06-04 LAB — URINALYSIS, MICROSCOPIC (REFLEX)

## 2019-06-04 LAB — LIPASE, BLOOD: Lipase: 736 U/L — ABNORMAL HIGH (ref 11–51)

## 2019-06-04 LAB — I-STAT BETA HCG BLOOD, ED (MC, WL, AP ONLY): I-stat hCG, quantitative: <5 m[IU]/mL (ref <5)

## 2019-06-04 MED ORDER — PROMETHAZINE HCL 25 MG/ML IJ SOLN
12.5000 mg | Freq: Once | INTRAMUSCULAR | Status: AC
  Administered 2019-06-04: 12.5 mg via INTRAVENOUS
  Filled 2019-06-04: qty 1

## 2019-06-04 MED ORDER — ONDANSETRON 4 MG PO TBDP: 4.0000 mg | ORAL_TABLET | Freq: Three times a day (TID) | ORAL | 0 refills | Status: DC | PRN

## 2019-06-04 MED ORDER — HYDROCODONE-ACETAMINOPHEN 5-325 MG PO TABS
1.0000 | ORAL_TABLET | Freq: Once | ORAL | Status: AC
  Administered 2019-06-04: 1 via ORAL
  Filled 2019-06-04: qty 1

## 2019-06-04 MED ORDER — SODIUM CHLORIDE 0.9 % IV BOLUS
1000.0000 mL | Freq: Once | INTRAVENOUS | Status: AC
  Administered 2019-06-04: 1000 mL via INTRAVENOUS

## 2019-06-04 MED ORDER — OXYCODONE-ACETAMINOPHEN 5-325 MG PO TABS: 1.0000 | ORAL_TABLET | ORAL | 0 refills | Status: DC | PRN

## 2019-06-04 MED ORDER — MORPHINE SULFATE (PF) 4 MG/ML IV SOLN
4.0000 mg | Freq: Once | INTRAVENOUS | Status: AC
  Administered 2019-06-04: 4 mg via INTRAVENOUS
  Filled 2019-06-04: qty 1

## 2019-06-04 MED ORDER — HYDROCODONE-ACETAMINOPHEN 5-325 MG PO TABS: 1.0000 | ORAL_TABLET | ORAL | 0 refills | Status: DC | PRN

## 2019-06-04 NOTE — ED Notes (Signed)
ED PA at bedside

## 2019-06-04 NOTE — ED Provider Notes (Signed)
[facility]-EMERGENCY DEPT Provider Note   CSN: [id number] Arrival date & time: 06/03/19  2246     History   Chief Complaint Chief Complaint  Patient presents with  . Abdominal Pain    HPI Samantha Rosales is a 38 y.o. female.     Patient with history of RA (on Humira), HTN presents with complaint of pain in the left upper abdomen that extends around to the left side and back. Symptoms started this morning and have been constant through the day. She has had nausea and vomiting. She feels her abdomen is distended. She tried taking Mag Citrate for constipation but symptoms were unchanged after a bowel movement. No chest pain, SOB, cough, fever, urinary symptoms. No aggravating or alleviating factors.   The history is provided by the patient. No language interpreter was used.    Past Medical History:  Diagnosis Date  . Abnormal Pap smear   . Alcohol use   . Allergic rhinitis   . BV (bacterial vaginosis)   . GERD (gastroesophageal reflux disease)   . High cholesterol   . HSV-2 (herpes simplex virus 2) infection   . Hypertension   . Rheumatoid arthritis (HCC)   . Seizures (HCC)    dx as child with epilepsy but no seizures since 89 and on no meds  . Vaginal Pap smear, abnormal     Patient Active Problem List   Diagnosis Date Noted  . Abnormal LFTs 09/05/2018  . Elevated lipase 09/05/2018  . History of methotrexate therapy 09/05/2018  . High triglycerides 09/05/2018  . Elevated liver enzymes 07/01/2018  . Essential hypertension 07/01/2018  . Encounter for well woman exam with routine gynecological exam 07/01/2018  . History of abnormal cervical Pap smear 06/26/2017  . Encounter for surveillance of injectable contraceptive 06/26/2017  . Encounter for gynecological examination with Papanicolaou smear of cervix 06/26/2017  . Arthritis, rheumatic, acute or subacute 11/18/2013  . Urticaria, idiopathic 08/24/2013  . HSV-2 (herpes simplex virus 2)  infection 05/29/2013  . HPV (human papilloma virus) infection 05/29/2013    Past Surgical History:  Procedure Laterality Date  . CERVICAL CONIZATION W/BX  12/20/2012   Procedure: CONIZATION CERVIX WITH BIOPSY;  Surgeon: Lazaro Arms, MD;  Location: [facility];  Service: Gynecology;  Laterality: N/A;  . HOLMIUM LASER APPLICATION  12/20/2012   Procedure: HOLMIUM LASER APPLICATION;  Surgeon: Lazaro Arms, MD;  Location: [facility];  Service: Gynecology;  Laterality: N/A;     OB History    Gravida  4   Para  2   Term  2   Preterm      AB  2   Living  2     SAB  1   TAB  1   Ectopic      Multiple      Live Births  2            Home Medications    Prior to Admission medications   Medication Sig Start Date End Date Taking? Authorizing Provider  Biotin 1000 MCG tablet Take 1,000 mcg by mouth daily.    [provider]  Cholecalciferol (VITAMIN D-3) 1000 UNITS CAPS Take 1 capsule by mouth daily.    [provider]  escitalopram (LEXAPRO) 10 MG tablet TAKE 1 TABLET BY MOUTH ONCE DAILY Patient taking differently: Take 10 mg by mouth daily.  08/28/18   Donita Brooks, MD  fenofibrate 160 MG tablet Take 1 tablet (160 mg total) by mouth  daily. 09/05/18   [name], MD  fluticasone (FLONASE) 50 MCG/ACT nasal spray USE TWO SPRAY(S) IN EACH NOSTRIL ONCE DAILY Patient taking differently: Place 2 sprays into both nostrils daily.  06/21/17   [name], MD  folic acid (FOLVITE) 1 MG tablet Take 1 mg by mouth daily.    [provider]  HUMIRA PEN 40 MG/0.8ML PNKT every 14 (fourteen) days. Every 2 weeks    [provider]  Icosapent Ethyl (VASCEPA) 1 g CAPS Take 2 capsules (2 g total) by mouth 2 (two) times daily. 09/05/18   [name], MD  losartan (COZAAR) 50 MG tablet Take 1 tablet (50 mg total) by mouth daily. 12/24/18   Alycia Rossetti, MD  medroxyPROGESTERone (DEPO-PROVERA) 150 MG/ML injection INJECT 1 ML INTRAMUSCULARLY IN  DOCTOR'S OFFICE EVERY 3 MONTHS. Patient taking differently: Inject 150 mg into the muscle every 3 (three) months. INJECT 1 ML INTRAMUSCULARLY IN DOCTOR'S OFFICE EVERY 3 MONTHS. 07/01/18   Estill Dooms, NP  nebivolol (BYSTOLIC) 10 MG tablet Take 1 tablet (10 mg total) by mouth daily. 03/27/18   [name], MD  pantoprazole (PROTONIX) 40 MG tablet TAKE 1 TABLET BY MOUTH TWICE DAILY 12/16/18   Mansouraty, Telford Nab., MD  SUMAtriptan (IMITREX) 50 MG tablet Take 1 tablet (50 mg total) by mouth every 2 (two) hours as needed for migraine. May repeat in 2 hours if headache persists or recurs. 10/25/18   [name], MD    Family History Family History  Problem Relation Age of Onset  . Hypertension Mother   . Hypertension Father   . Pancreatic cancer Paternal Uncle   . Colon cancer Neg Hx   . Esophageal cancer Neg Hx   . Inflammatory bowel disease Neg Hx   . Liver disease Neg Hx   . Rectal cancer Neg Hx   . Stomach cancer Neg Hx     Social History Social History   Tobacco Use  . Smoking status: Current Every Day Smoker    Packs/day: 0.50    Years: 15.00    Pack years: 7.50    Types: Cigarettes  . Smokeless tobacco: Never Used  Substance Use Topics  . Alcohol use: Yes    Comment: on the weekends drinks with friends  . Drug use: No     Allergies   Penicillins   Review of Systems Review of Systems  Constitutional: Negative for chills and fever.  HENT: Negative.   Respiratory: Negative.   Cardiovascular: Negative.   Gastrointestinal: Positive for abdominal pain, nausea and vomiting.  Genitourinary: Negative.   Musculoskeletal: Negative.  Negative for myalgias.  Skin: Negative.   Neurological: Negative.      Physical Exam Updated Vital Signs BP (!) 163/107   Pulse 71   Temp 97.8 F (36.6 C) (Oral)   Resp 18   Ht 5\' 8"  (1.727 m)   Wt 74.8 kg   SpO2 100%   BMI 25.09 kg/m   Physical Exam Vitals signs and nursing note reviewed.  Cardiovascular:      Rate and Rhythm: Normal rate and regular rhythm.     Heart sounds: No murmur.  Pulmonary:     Effort: Pulmonary effort is normal.     Breath sounds: No wheezing, rhonchi or rales.  Abdominal:     General: Abdomen is flat. There is no distension.     Palpations: Abdomen is soft.     Tenderness: There is abdominal tenderness in the left upper  quadrant. There is no guarding or rebound.  Skin:    General: Skin is warm and dry.      ED Treatments / Results  Labs (all labs ordered are listed, but only abnormal results are displayed) Labs Reviewed  URINALYSIS, ROUTINE W REFLEX MICROSCOPIC - Abnormal; Notable for the following components:      Result Value   Color, Urine AMBER (*)    APPearance HAZY (*)    Glucose, UA 150 (*)    Bilirubin Urine SMALL (*)    Ketones, ur 5 (*)    Protein, ur 100 (*)    All other components within normal limits  URINALYSIS, MICROSCOPIC (REFLEX) - Abnormal; Notable for the following components:   Bacteria, UA RARE (*)    All other components within normal limits  LIPASE, BLOOD  COMPREHENSIVE METABOLIC PANEL  CBC  I-STAT BETA HCG BLOOD, ED ([facility], [facility], [facility] ONLY)    EKG None  Radiology No results found.  Procedures Procedures (including critical care time)  Medications Ordered in ED Medications  sodium chloride flush (NS) 0.9 % injection 3 mL (0 mLs Intravenous Hold 06/04/19 0057)     Initial Impression / Assessment and Plan / ED Course  I have reviewed the triage vital signs and the nursing notes.  Pertinent labs & imaging results that were available during my care of the patient were reviewed by me and considered in my medical decision making (see chart for details).        Patient to ED with LUQ abdominal pain, nausea and vomiting x1 day. No fever.  Labs show elevated LFT's and lipase. She has had this in the past, she states felt related to Methotrexate use for her RA. Her lipase is 736 tonight. Total bilirubin 1.4, AST 70, both mild  elevations. No vomiting in ED but she endorses ongoing nausea. Do not feel admission is unreasonable however the patient declines and states she wants to be discharged home.   Her CBG is also elevated at 236. No acidosis. No previous diagnosis of DM. IVF's ordered, pain and nausea medications provided.   On re-examination, the patient is feeling some better. She states she is ready to go home. Discussed with importance of follow up with PCP given the rising lipase and now elevated blood sugar.     Final Clinical Impressions(s) / ED Diagnoses   Final diagnoses:  None   1. Acute pancreatitis 2. Hyperglycemia  ED Discharge Orders    None       Elpidio Anis, PA-C 06/04/19 0604    Derwood Kaplan, MD 06/10/19 [phone]

## 2019-06-04 NOTE — Discharge Instructions (Addendum)
Call your doctor for a recheck appointment this week. Your blood sugar will need to be rechecked as well as it is elevated tonight to 238. Your lipase (test for pancreas) is 736.   Push fluids. Maintain a clear liquid diet until your pain resolves and then advance as tolerated.

## 2019-06-10 ENCOUNTER — Ambulatory Visit (INDEPENDENT_AMBULATORY_CARE_PROVIDER_SITE_OTHER): Payer: BC Managed Care – PPO | Admitting: Family Medicine

## 2019-06-10 ENCOUNTER — Encounter: Payer: Self-pay | Admitting: Family Medicine

## 2019-06-10 ENCOUNTER — Other Ambulatory Visit: Payer: Self-pay

## 2019-06-10 VITALS — BP 112/72 | HR 88 | Temp 98.7°F | Resp 14 | Ht 68.0 in | Wt 165.0 lb

## 2019-06-10 DIAGNOSIS — R739 Hyperglycemia, unspecified: Secondary | ICD-10-CM

## 2019-06-10 DIAGNOSIS — E781 Pure hyperglyceridemia: Secondary | ICD-10-CM | POA: Diagnosis not present

## 2019-06-10 DIAGNOSIS — K859 Acute pancreatitis without necrosis or infection, unspecified: Secondary | ICD-10-CM | POA: Diagnosis not present

## 2019-06-10 DIAGNOSIS — Z09 Encounter for follow-up examination after completed treatment for conditions other than malignant neoplasm: Secondary | ICD-10-CM

## 2019-06-10 MED ORDER — FENOFIBRATE MICRONIZED 200 MG PO CAPS: 200.0000 mg | ORAL_CAPSULE | Freq: Every day | ORAL | 3 refills | Status: DC

## 2019-06-10 NOTE — Progress Notes (Signed)
Patient ID: Samantha Rosales, female    DOB: 06-08-1981, 38 y.o.   MRN: [id number]  PCP: [name], MD  Chief Complaint  Patient presents with  . ER F/U    pancreatitis    Subjective:   Samantha Rosales is a 38 y.o. female, presents to clinic with CC of pancreatitis f/up from ER visit on 06/03/2019.  Patient woke up in the morning, had a small bit to drink of Ensure and then had fairly severe left upper quadrant abdominal pain nausea and vomiting and went to the ER for evaluation.  She was given IV fluids and antiemetics.  Lab work was pertinent for low potassium, hyperglycemia, elevated lipase 736, elevated AST and bili, mild leukocytosis.  She was able to discharge home with pain medication, antiemetics, and was diagnosed with acute pancreatitis, unspecified complication status and unspecified pancreatitis type and hyperglycemia.  She presents here for follow-up, she has been able to tolerate eating and drinking.  She continues to have vague fullness tenderness and achiness to her left upper quadrant of her abdomen without radiation.  When she first went to the ER she had tried to move her bowels first and felt bloated and constipated, she continues to feel some of those symptoms associated with the pain.    Over the past year she has had elevated liver enzymes and has gone to gastroenterology for this thought to be a drug side effect of methotrexate.  She is also found to have severely elevated triglycerides in the past as high as greater than 1700 and she states that she has been compliant with fenofibrate.  She does not drink alcohol.  There is no one else in her family with any history that she knows of of elevated triglycerides or pancreatitis.  I last saw her about 1 year ago when she presented with very similar symptoms was sent to the ER, at that time they found elevated liver enzymes, alk phos, bilirubin, lipase of 126 and with negative right upper quadrant ultrasound she was  discharged home with diagnosis of pancreatitis, and she did follow-up with her PCP afterwards, methotrexate was discontinued and she was also referred to follow-up with her rheumatologist, liver enzymes trended downward.  But she did continue to have intermittent episodes of abdominal pain also has a history of GERD and irritable bowel over the past year. She did see gastroenterology who increased treatment of GERD with twice daily dosing of PPI, did more test for her elevated LFTs to rule out autoimmune hepatitis, and they did monitor her triglycerides as well.  There was follow-up CT imaging of the abdomen scheduled in October -reviewed radiology report and images CT abdomen with contrast from September 17, 2018 which was unremarkable.  GI's remaining lab workup was negative for common hemochromatosis eval for elevated LFT's, and GI was to monitor LFTs and iron/TIBC/ferritin.  Patient became symptomatic again before she get her follow-up labs done with GI, in the ER lab work was repeated, lipase was 7 times upper limits of normal and CT imaging in the ER in December 7 months ago did show pancreatic fluid and edema consistent with acute pancreatitis.  After the ER visit in December 2019, there was no follow-up visits done here at primary care office or with GI, she again had abdominal pain on 630 and it was so severe that led to her ER visit.  She is here for follow-up today.  Patient states that she has not drinking any alcohol  at all over the past 6 months or more.  No other over-the-counter or herbal supplements used.  She is currently taking Humira and no longer using methotrexate to manage her RA.   Her PCP here has been treating hypertriglyceridemia with Vascepa and fenofibrate.    Weight is stable since ER visit, and overall roughly the same as one year ago.   Wt Readings from Last 10 Encounters:  06/10/19 165 lb (74.8 kg)  06/03/19 165 lb (74.8 kg)  11/21/18 172 lb (78 kg)  10/25/18 177 lb  (80.3 kg)  10/21/18 178 lb 4 oz (80.9 kg)  09/03/18 172 lb (78 kg)  07/02/18 170 lb (77.1 kg)  07/01/18 172 lb (78 kg)  06/14/18 163 lb (73.9 kg)  05/07/18 163 lb (73.9 kg)   Of note patient had negative COVID test 6/24  Currently abdominal pain is fairly constant, moderate to mild, located in left upper quadrant without radiation, worse with palpation, certain movements, laying flat and with eating, does have a sensation of bloating and fullness, does have normal bowel movements, no nausea, vomiting, diarrhea in the past several days   Patient Active Problem List   Diagnosis Date Noted  . Abnormal LFTs 09/05/2018  . Elevated lipase 09/05/2018  . History of methotrexate therapy 09/05/2018  . High triglycerides 09/05/2018  . Elevated liver enzymes 07/01/2018  . Essential hypertension 07/01/2018  . Encounter for well woman exam with routine gynecological exam 07/01/2018  . History of abnormal cervical Pap smear 06/26/2017  . Encounter for surveillance of injectable contraceptive 06/26/2017  . Encounter for gynecological examination with Papanicolaou smear of cervix 06/26/2017  . Arthritis, rheumatic, acute or subacute 11/18/2013  . Urticaria, idiopathic 08/24/2013  . HSV-2 (herpes simplex virus 2) infection 05/29/2013  . HPV (human papilloma virus) infection 05/29/2013     Prior to Admission medications   Medication Sig Start Date End Date Taking? Authorizing Provider  Biotin 1000 MCG tablet Take 1,000 mcg by mouth daily.   Yes [provider]  Cholecalciferol (VITAMIN D-3) 1000 UNITS CAPS Take 1 capsule by mouth daily.   Yes [provider]  escitalopram (LEXAPRO) 10 MG tablet TAKE 1 TABLET BY MOUTH ONCE DAILY Patient taking differently: Take 10 mg by mouth daily.  08/28/18  Yes [name], MD  fenofibrate 160 MG tablet Take 1 tablet (160 mg total) by mouth daily. 09/05/18  Yes [name], MD  fluticasone (FLONASE) 50 MCG/ACT nasal spray USE TWO  SPRAY(S) IN EACH NOSTRIL ONCE DAILY Patient taking differently: Place 2 sprays into both nostrils daily.  06/21/17  Yes [name], MD  folic acid (FOLVITE) 1 MG tablet Take 1 mg by mouth daily.   Yes [provider]  HUMIRA PEN 40 MG/0.8ML PNKT every 14 (fourteen) days. Every 2 weeks   Yes [provider]  Icosapent Ethyl (VASCEPA) 1 g CAPS Take 2 capsules (2 g total) by mouth 2 (two) times daily. Patient taking differently: Take 2 g by mouth daily.  09/05/18  Yes [name], MD  losartan (COZAAR) 50 MG tablet Take 1 tablet (50 mg total) by mouth daily. 12/24/18  Yes [place], Modena Nunnery, MD  medroxyPROGESTERone (DEPO-PROVERA) 150 MG/ML injection INJECT 1 ML INTRAMUSCULARLY IN DOCTOR'S OFFICE EVERY 3 MONTHS. Patient taking differently: Inject 150 mg into the muscle every 3 (three) months. INJECT 1 ML INTRAMUSCULARLY IN DOCTOR'S OFFICE EVERY 3 MONTHS. 07/01/18  Yes Derrek Monaco A, NP  nebivolol (BYSTOLIC) 10 MG tablet Take 1 tablet (  10 mg total) by mouth daily. 03/27/18  Yes [name], MD  pantoprazole (PROTONIX) 40 MG tablet TAKE 1 TABLET BY MOUTH TWICE DAILY 12/16/18  Yes Mansouraty, Telford Nab., MD  SUMAtriptan (IMITREX) 50 MG tablet Take 1 tablet (50 mg total) by mouth every 2 (two) hours as needed for migraine. May repeat in 2 hours if headache persists or recurs. 10/25/18  Yes [name], MD  HYDROcodone-acetaminophen (NORCO/VICODIN) 5-325 MG tablet Take 1-2 tablets by mouth every 4 (four) hours as needed for severe pain. Patient not taking: Reported on 06/10/2019 06/04/19   Charlann Lange, PA-C  ondansetron (ZOFRAN ODT) 4 MG disintegrating tablet Take 1 tablet (4 mg total) by mouth every 8 (eight) hours as needed for nausea or vomiting. Patient not taking: Reported on 06/10/2019 06/04/19   Charlann Lange, PA-C     Allergies  Allergen Reactions  . Penicillins     Has patient had a PCN reaction causing immediate rash, facial/tongue/throat swelling, SOB or  lightheadedness with hypotension: Unknown Has patient had a PCN reaction causing severe rash involving mucus membranes or skin necrosis: Unknown Has patient had a PCN reaction that required hospitalization: Unknown Has patient had a PCN reaction occurring within the last 10 years: No If all of the above answers are "NO", then may proceed with Cephalosporin use.      Family History  Problem Relation Age of Onset  . Hypertension Mother   . Hypertension Father   . Pancreatic cancer Paternal Uncle   . Colon cancer Neg Hx   . Esophageal cancer Neg Hx   . Inflammatory bowel disease Neg Hx   . Liver disease Neg Hx   . Rectal cancer Neg Hx   . Stomach cancer Neg Hx      Social History   Socioeconomic History  . Marital status: Single    Spouse name: Not on file  . Number of children: 2  . Years of education: Not on file  . Highest education level: Not on file  Occupational History  . Occupation: FRONT DESK[employer]    Employer: [employer]  . Financial resource strain: Not on file  . Food insecurity    Worry: Not on file    Inability: Not on file  . Transportation needs    Medical: Not on file    Non-medical: Not on file  Tobacco Use  . Smoking status: Current Every Day Smoker    Packs/day: 0.50    Years: 15.00    Pack years: 7.50    Types: Cigarettes  . Smokeless tobacco: Never Used  Substance and Sexual Activity  . Alcohol use: Yes    Comment: on the weekends drinks with friends  . Drug use: No  . Sexual activity: Yes    Birth control/protection: Injection    Comment: Depo Provera every 3 months  Lifestyle  . Physical activity    Days per week: Not on file    Minutes per session: Not on file  . Stress: Not on file  Relationships  . Social Herbalist on phone: Not on file    Gets together: Not on file    Attends religious service: Not on file    Active member of club or organization: Not on file    Attends meetings  of clubs or organizations: Not on file    Relationship status: Not on file  . Intimate partner violence    Fear of current or ex  partner: Not on file    Emotionally abused: Not on file    Physically abused: Not on file    Forced sexual activity: Not on file  Other Topics Concern  . Not on file  Social History Narrative  . Not on file     Review of Systems  Constitutional: Negative.   HENT: Negative.   Eyes: Negative.   Respiratory: Negative.   Cardiovascular: Negative.   Gastrointestinal: Negative.   Endocrine: Negative.   Genitourinary: Negative.   Musculoskeletal: Negative.   Skin: Negative.   Allergic/Immunologic: Negative.   Neurological: Negative.   Hematological: Negative.   Psychiatric/Behavioral: Negative.   All other systems reviewed and are negative.      Objective:    Vitals:   06/10/19 1508  BP: 112/72  Pulse: 88  Resp: 14  Temp: 98.7 F (37.1 C)  TempSrc: Oral  SpO2: 99%  Weight: 165 lb (74.8 kg)  Height: 5' 8"  (1.727 m)      Wt Readings from Last 5 Encounters:  06/10/19 165 lb (74.8 kg)  06/03/19 165 lb (74.8 kg)  11/21/18 172 lb (78 kg)  10/25/18 177 lb (80.3 kg)  10/21/18 178 lb 4 oz (80.9 kg)    Physical Exam Vitals signs and nursing note reviewed.  Constitutional:      General: She is not in acute distress.    Appearance: Normal appearance. She is well-developed and normal weight. She is not ill-appearing, toxic-appearing or diaphoretic.  HENT:     Head: Normocephalic and atraumatic.     Right Ear: External ear normal.     Left Ear: External ear normal.     Nose: Nose normal.     Mouth/Throat:     Mouth: Mucous membranes are moist.  Eyes:     General: No scleral icterus.       Right eye: No discharge.        Left eye: No discharge.     Conjunctiva/sclera: Conjunctivae normal.     Pupils: Pupils are equal, round, and reactive to light.  Neck:     Trachea: No tracheal deviation.  Cardiovascular:     Rate and Rhythm: Normal  rate and regular rhythm.     Pulses: Normal pulses.     Heart sounds: Normal heart sounds. No murmur. No friction rub. No gallop.   Pulmonary:     Effort: Pulmonary effort is normal. No respiratory distress.     Breath sounds: Normal breath sounds. No stridor. No wheezing or rales.  Abdominal:     General: Bowel sounds are normal. There is no distension.     Palpations: Abdomen is soft. There is no mass.     Tenderness: There is abdominal tenderness (ttp to LUQ and some mild to epigastric area). There is guarding (mild voluntary). There is no right CVA tenderness or left CVA tenderness.     Hernia: No hernia is present.  Musculoskeletal: Normal range of motion.  Skin:    General: Skin is warm and dry.     Capillary Refill: Capillary refill takes less than 2 seconds.     Coloration: Skin is not jaundiced or pale.     Findings: No bruising, erythema or rash.  Neurological:     Mental Status: She is alert.     Motor: No abnormal muscle tone.     Coordination: Coordination normal.  Psychiatric:        Mood and Affect: Mood normal.        Behavior: Behavior  normal.        Judgment: Judgment normal.           Assessment & Plan:   Patient is a 39 year old female who presents here for follow-up after ER visit for acute pancreatitis, she also had a ER visit 7 months ago which she did not get seen for afterwards.  She was previously referred to GI for LFT elevations which was worked up and she was to follow-up with them with some repeat lab work however she was lost to follow-up and has not been since October of last year.  Concern for hypertriglyceridemia causing recurrent pancreatitis?   Although other causes not ruled out completely.  No past evidence of gallstone pancreatitis. She denies ETOH, idiopathic/autoimmune/genetic? Check triglycerides, serum calcium, LFT's, eval for new onset DM?  And defer other more specialized tests to GI, she may also need EUS or other specialized  imaging/procedures.  I discussed with her at length diet for high triglycerides, she is going to continue fenofibrate and vascepa.  She does need to return another day for fasting labs.  In the ER her blood sugar was elevated would like to recheck that as well as A1c, recheck for lipase/amylase trend, recheck electrolyte derangements from ER, recheck white count. .   Multiple handouts were reviewed with her for diet changes, glycemic index, pancreatitis both chronic and acute, causes and treatment were reviewed and multiple handouts were given to her from UTD and epic.  Will need to f/up with GI for further eval and tx. Once labs results may do trial of Creon for abd sxs.    ICD-10-CM   1. Acute pancreatitis, unspecified complication status, unspecified pancreatitis type  K85.90 CBC with Differential/Platelet    COMPLETE METABOLIC PANEL WITH GFR    Hemoglobin A1c    Lipid panel    Lipase    Amylase    Hemoglobin A1c    Lipase    Amylase    CBC with Differential/Platelet    Lipid panel    Comprehensive metabolic panel    lipase/protease/amylase (CREON) 12000 units CPEP capsule   still pain and ttp, continue to carefully advance diet as tolerated, push ample clear fluids calorie free or low in calories, repeat labs to trend  2. Hypertriglyceridemia  E78.1 fenofibrate micronized (LOFIBRA) 200 MG capsule    COMPLETE METABOLIC PANEL WITH GFR    Lipid panel    Lipase    Amylase    Hemoglobin A1c    Lipase    Amylase    CBC with Differential/Platelet    Lipid panel    Comprehensive metabolic panel   repeat FLP, familial? discussed low GI diet extensively, increased fenofibrate dose, avoid fats, simple carbs, ETOH  3. Encounter for examination following treatment at hospital  Z30 COMPLETE METABOLIC PANEL WITH GFR    Hemoglobin A1c   gradually improving, all records, labs, images reviewed from ER visit 06/03/2019 and !01/2019  4. Hyperglycemia  Q65.7 COMPLETE METABOLIC PANEL WITH GFR     Hemoglobin A1c   Greater than 50% of this visit was spent in direct face-to-face counseling, obtaining history and physical, discussing and educating pt on treatment plan.  Total time of this visit was 40 min.  Remainder of time involved but was not limited to reviewing chart (recent and pertinent OV notes and labs - GI, ER visits, labs and imaging results), documentation in EMR, and coordinating care and treatment plan.  Large amount of time spent with patient reviewing past results, dietary  tx and nutritional counseling for dx's.  Red flags and ER precautions reviewed.   Delsa Grana, PA-C 06/10/19 3:11 PM

## 2019-06-10 NOTE — Patient Instructions (Addendum)
See info below on triglycerides and pancreatitis. Follow up with your GI doctor about pancreatitis work up and treatment. Anytime you have nausea and vomiting with pain, go to a liquid diet if you can tolerate it.  Return for fasting labs - any day we are open after 8am  OR - do the outside labs - ordered at [facility]  Work on low Glycemic Index diet.   Chronic Pancreatitis  Chronic pancreatitis is long-lasting inflammation and scarring of the pancreas. The pancreas is a gland that is located behind the stomach. It makes enzymes that help to digest food. The pancreas also releases hormones called glucagon and insulin, which help regulate blood sugar (glucose). Damage to the pancreas may affect digestion, cause pain in the upper abdomen and back, and cause diabetes. Inflammation can also irritate other organs in the abdomen near the pancreas. At first, pancreatitis may be sudden (acute). If you have several or prolonged episodes of acute pancreatitis, the condition can turn into chronic pancreatitis. What are the causes? The most common cause of this condition is alcohol abuse. Other causes include:  High (elevated) levels of triglycerides in the blood (hypertriglyceridemia).  Gallstones or other conditions that can block the tube that drains the pancreas (pancreatic duct).  Pancreatic cancer.  Cystic fibrosis.  Too much calcium in the blood (hypercalcemia), which may be caused by an overactive parathyroid gland (hyperparathyroidism).  Certain medicines.  Injury to the pancreas.  Infection.  Autoimmune pancreatitis. This is when the body's disease-fighting (immune) system attacks the pancreas.  Genes that are passed from parent to child (inherited). In some cases, the cause may not be known. What increases the risk? This condition is more likely to develop in:  Men.  People who are 7135-39 years old.  People who have a family history of pancreatitis.  People who smoke  tobacco.  People who drink large amounts of alcohol over a long period of time. What are the signs or symptoms? Symptoms of this condition may include:  Pain in the abdomen or upper back. Pain may get worse after eating.  Nausea and vomiting.  Fever.  Weight loss.  A change in the color and consistency of bowel movements, such as stools that are oily, fatty, or clay-colored. How is this diagnosed? This condition is diagnosed based on your symptoms, your medical history, and a physical exam. You may have tests, such as:  Blood tests.  Stool samples.  Biopsy of the pancreas. This is the removal of a small amount of pancreas tissue to be tested in a lab.  Imaging tests, such as: ? X-rays. ? CT scan. ? MRI. ? Ultrasound. How is this treated? You may need to be treated at a hospital. Treatment may involve:  Resting the pancreas. You may need to stop eating and drinking for a few days to give your pancreas time to recover. During this time, you will be given IV fluids to keep you hydrated.  Controlling pain. You may be given pain medicines by mouth (orally) or as injections.  Improving digestion. You may be given: ? Medicines to replace your pancreatic enzymes. ? Vitamin supplements. ? A specific diet to follow. You may work with a diet and nutrition specialist (dietitian) to make an eating plan.  Surgery to: ? Clear the pancreatic ducts of any blockages, such as gallstones. ? Remove any fluid or damaged tissue from the pancreas. Other treatments may include:  Preventing diabetes. Your health care provider may recommend that you: ? Get regular  screening tests for diabetes. ? Monitor your blood glucose regularly.  Lifestyle changes, such as stopping alcohol use.  Steroid medicines, if your condition is caused by your immune system attacking your body's own tissues (autoimmune disease). Follow these instructions at home: Eating and drinking      Do not drink  alcohol. If you need help quitting, ask your health care provider.  Follow a diet as told by your health care provider or dietitian, if this applies. This may include: ? Limiting how much fat you eat. ? Eating smaller meals more often. ? Avoiding caffeine.  Drink enough fluid to keep your urine pale yellow. General instructions  Take over-the-counter and prescription medicines only as told by your health care provider. These include vitamin supplements.  Do not drive or use heavy machinery while taking prescription pain medicine.  If you are taking prescription pain medicine, take actions to prevent or treat constipation. Your health care provider may recommend that you: ? Take an over-the-counter or prescription medicine for constipation. ? Eat foods that are high in fiber such as whole grains and beans. ? Limit foods that are high in fat and processed sugars, such as fried or sweet foods.  Do not use any products that contain nicotine or tobacco, such as cigarettes and e-cigarettes. If you need help quitting, ask your health care provider.  If recommended by your health care provider, monitor your blood glucose at home.  Keep all follow-up visits as told by your health care provider. This is important. Contact a health care provider if:  You have pain that does not get better with medicine.  You have a fever.  You have sudden weight loss. Get help right away if:  Your pain suddenly gets worse.  You have sudden swelling in your abdomen.  You start to vomit often.  You vomit blood.  You have diarrhea that does not go away.  You have blood in your stool.  You become confused or you have trouble thinking clearly. Summary  Chronic pancreatitis is long-lasting inflammation and scarring of the pancreas. Damage to the pancreas may affect digestion, cause pain in the upper abdomen and back, and cause diabetes. Inflammation can also irritate other organs in the abdomen near  the pancreas.  Common causes of this condition are alcohol abuse, gallstones, high (elevated) levels of triglycerides, and certain medicines.  This condition is sometimes treated at a hospital and may involve resting the pancreas, controlling pain, replacing enzymes, and avoiding alcohol. This information is not intended to replace advice given to you by your health care provider. Make sure you discuss any questions you have with your health care provider. Document Released: 12/17/2015 Document Revised: 09/09/2018 Document Reviewed: 07/20/2017 Elsevier Patient Education  2020 [place].   Pancreatitis Eating Plan Pancreatitis is when your pancreas becomes irritated and swollen (inflamed). The pancreas is a small organ located behind your stomach. It helps your body digest food and regulate your blood sugar. Pancreatitis can affect how your body digests food, especially foods with fat. You may also have other symptoms such as abdominal pain or nausea. When you have pancreatitis, following a low-fat eating plan may help you manage symptoms and recover more quickly. Work with your health care provider or a diet and nutrition specialist (dietitian) to create an eating plan that is right for you. What are tips for following this plan? Reading food labels Use the information on food labels to help keep track of how much fat you eat:  Check the serving size.  Look for the amount of total fat in grams (g) in one serving. ? Low-fat foods have 3 g of fat or less per serving. ? Fat-free foods have 0.5 g of fat or less per serving.  Keep track of how much fat you eat based on how many servings you eat. ? For example, if you eat two servings, the amount of fat you eat will be two times what is listed on the label. Shopping   Buy low-fat or nonfat foods, such as: ? Fresh, frozen, or canned fruits and vegetables. ? Grains, including pasta, bread, and rice. ? Lean meat, poultry, fish, and other  protein foods. ? Low-fat or nonfat dairy.  Avoid buying bakery products and other sweets made with whole milk, butter, and eggs.  Avoid buying snack foods with added fat, such as anything with butter or cheese flavoring. Cooking  Remove skin from poultry, and remove extra fat from meat.  Limit the amount of fat and oil you use to 6 teaspoons or less per day.  Cook using low-fat methods, such as boiling, broiling, grilling, steaming, or baking.  Use spray oil to cook. Add fat-free chicken broth to add flavor and moisture.  Avoid adding cream to thicken soups or sauces. Use other thickeners such as corn starch or tomato paste. Meal planning   Eat a low-fat diet as told by your dietitian. For most people, this means having no more than 55-65 grams of fat each day.  Eat small, frequent meals throughout the day. For example, you may have 5-6 small meals instead of 3 large meals.  Drink enough fluid to keep your urine pale yellow.  Do not drink alcohol. Talk to your health care provider if you need help stopping.  Limit how much caffeine you have, including black coffee, black and green tea, caffeinated soft drinks, and energy drinks. General information  Let your health care provider or dietitian know if you have unplanned weight loss on this eating plan.  You may be instructed to follow a clear liquid diet during a flare of symptoms. Talk with your health care provider about how to manage your diet during symptoms of a flare.  Take any vitamins or supplements as told by your health care provider.  Work with a Data processing managerdietitian, especially if you have other conditions such as obesity or diabetes mellitus. What foods should I avoid? Fruits Fried fruits. Fruits served with butter or cream. Vegetables Fried vegetables. Vegetables cooked with butter, cheese, or cream. Grains Biscuits, waffles, donuts, pastries, and croissants. Pies and cookies. Butter-flavored popcorn. Regular  crackers. Meats and other protein foods Fatty cuts of meat. Poultry with skin. Organ meats. Bacon, sausage, and cold cuts. Whole eggs. Nuts and nut butters. Dairy Whole and 2% milk. Whole milk yogurt. Whole milk ice cream. Cream and half-and-half. Cream cheese. Sour cream. Cheese. Beverages Wine, beer, and liquor. The items listed above may not be a complete list of foods and beverages to avoid. Contact a dietitian for more information. Summary  Pancreatitis can affect how your body digests food, especially foods with fat.  When you have pancreatitis, it is recommended that you follow a low-fat eating plan to help you recover more quickly and manage symptoms. For most people, this means limiting fat to no more than 55-65 grams per day.  Do not drink alcohol. Limit the amount of caffeine you have, and drink enough fluid to keep your urine pale yellow. This information is not intended to  replace advice given to you by your health care provider. Make sure you discuss any questions you have with your health care provider. Document Released: 02/26/2018 Document Revised: 03/13/2019 Document Reviewed: 02/26/2018 Elsevier Patient Education  2020 [employer]   High Triglycerides Eating Plan Triglycerides are a type of fat in the blood. High levels of triglycerides can increase your risk of heart disease and stroke. If your triglyceride levels are high, choosing the right foods can help lower your triglycerides and keep your heart healthy. Work with your health care provider or a diet and nutrition specialist (dietitian) to develop an eating plan that is right for you. What are tips for following this plan? General guidelines   Lose weight, if you are overweight. For most people, losing 5-10 lbs (2-5 kg) helps lower triglyceride levels. A weight-loss plan may include. ? 30 minutes of exercise at least 5 days a week. ? Reducing the amount of calories, sugar, and fat you eat.  Eat a wide variety  of fresh fruits, vegetables, and whole grains. These foods are high in fiber.  Eat foods that contain healthy fats, such as fatty fish, nuts, seeds, and olive oil.  Avoid foods that are high in added sugar, added salt (sodium), saturated fat, and trans fat.  Avoid low-fiber, refined carbohydrates such as white bread, crackers, noodles, and white rice.  Avoid foods with partially hydrogenated oils (trans fats), such as fried foods or stick margarine.  Limit alcohol intake to no more than 1 drink a day for nonpregnant women and 2 drinks a day for men. One drink equals 12 oz of beer, 5 oz of wine, or 1 oz of hard liquor. Your health care provider may recommend that you drink less depending on your overall health. Reading food labels  Check food labels for the amount of saturated fat. Choose foods with no or very little saturated fat.  Check food labels for the amount of trans fat. Choose foods with no trans fat.  Check food labels for the amount of cholesterol. Choose foods low in cholesterol. Ask your dietitian how much cholesterol you should have each day.  Check food labels for the amount of sodium. Choose foods with less than 140 milligrams (mg) per serving. Shopping  Buy dairy products labeled as nonfat (skim) or low-fat (1%).  Avoid buying processed or prepackaged foods. These are often high in added sugar, sodium, and fat. Cooking  Choose healthy fats when cooking, such as olive oil or canola oil.  Cook foods using lower fat methods, such as baking, broiling, boiling, or grilling.  Make your own sauces, dressings, and marinades when possible, instead of buying them. Store-bought sauces, dressings, and marinades are often high in sodium and sugar. Meal planning  Eat more home-cooked food and less restaurant, buffet, and fast food.  Eat fatty fish at least 2 times each week. Examples of fatty fish include salmon, trout, mackerel, tuna, and herring.  If you eat whole eggs, do  not eat more than 3 egg yolks per week. What foods are recommended? The items listed may not be a complete list. Talk with your dietitian about what dietary choices are best for you. Grains Whole wheat or whole grain breads, crackers, cereals, and pasta. Unsweetened oatmeal. Bulgur. Barley. Quinoa. Brown rice. Whole wheat flour tortillas. Vegetables Fresh or frozen vegetables. Low-sodium canned vegetables. Fruits All fresh, canned (in natural juice), or frozen fruits. Meats and other protein foods Skinless chicken or [place]. Ground chicken or [place]. Lean cuts of  pork, trimmed of fat. Fish and seafood, especially salmon, trout, and herring. Egg whites. Dried beans, peas, or lentils. Unsalted nuts or seeds. Unsalted canned beans. Natural peanut or almond butter. Dairy Low-fat dairy products. Skim or low-fat (1%) milk. Reduced fat (2%) and low-sodium cheese. Low-fat ricotta cheese. Low-fat cottage cheese. Plain, low-fat yogurt. Fats and oils Tub margarine without trans fats. Light or reduced-fat mayonnaise. Light or reduced-fat salad dressings. Avocado. Safflower, olive, sunflower, soybean, and canola oils. What foods are not recommended? The items listed may not be a complete list. Talk with your dietitian about what dietary choices are best for you. Grains White bread. White (regular) pasta. White rice. Cornbread. Bagels. Pastries. Crackers that contain trans fat. Vegetables Creamed or fried vegetables. Vegetables in a cheese sauce. Fruits Sweetened dried fruit. Canned fruit in syrup. Fruit juice. Meats and other protein foods Fatty cuts of meat. Ribs. Chicken wings. Tomasa Blase. Sausage. Bologna. Salami. Chitterlings. Fatback. Hot dogs. Bratwurst. Packaged lunch meats. Dairy Whole or reduced-fat (2%) milk. Half-and-half. Cream cheese. Full-fat or sweetened yogurt. Full-fat cheese. Nondairy creamers. Whipped toppings. Processed cheese or cheese spreads. Cheese curds. Beverages Alcohol.  Sweetened drinks, such as soda, lemonade, fruit drinks, or punches. Fats and oils Butter. Stick margarine. Lard. Shortening. Ghee. Bacon fat. Tropical oils, such as coconut, palm kernel, or palm oils. Sweets and desserts Corn syrup. Sugars. Honey. Molasses. Candy. Jam and jelly. Syrup. Sweetened cereals. Cookies. Pies. Cakes. Donuts. Muffins. Ice cream. Condiments Store-bought sauces, dressings, and marinades that are high in sugar, such as ketchup and barbecue sauce. Summary  High levels of triglycerides can increase the risk of heart disease and stroke. Choosing the right foods can help lower your triglycerides.  Eat plenty of fresh fruits, vegetables, and whole grains. Choose low-fat dairy and lean meats. Eat fatty fish at least twice a week.  Avoid processed and prepackaged foods with added sugar, sodium, saturated fat, and trans fat.  If you need suggestions or have questions about what types of food are good for you, talk with your health care provider or a dietitian. This information is not intended to replace advice given to you by your health care provider. Make sure you discuss any questions you have with your health care provider. Document Released: 09/07/2004 Document Revised: 11/02/2017 Document Reviewed: 01/23/2017 Elsevier Patient Education  2020 [employer].

## 2019-06-11 ENCOUNTER — Encounter: Payer: Self-pay | Admitting: Family Medicine

## 2019-06-11 MED ORDER — PANCRELIPASE (LIP-PROT-AMYL) 12000-38000 UNITS PO CPEP: 12000.0000 [IU] | ORAL_CAPSULE | Freq: Three times a day (TID) | ORAL | 1 refills | Status: DC

## 2019-06-23 ENCOUNTER — Telehealth: Payer: Self-pay

## 2019-06-23 NOTE — Telephone Encounter (Signed)
Pt called to state that Leisa had switched her finofibrate to vascepa. Pt reports that the vascepa is expensive and wanted to know if you could put her on something else. Please advise.

## 2019-06-24 NOTE — Telephone Encounter (Signed)
No I only switched fenofibrate to fenofibrate micronized (LOFIBRA) 200 MG capsule (slightly higher dose) Vascepa was from Dr. Dennard Schaumann from 09/2018, which I did not change or prescribe/refill.  Please clarify with pt and/or pharmacy what is expensive and if it is a med Dr. Dennard Schaumann prescribed we can forward to him to address.  Thank you

## 2019-06-25 NOTE — Telephone Encounter (Signed)
Left message return call

## 2019-07-03 ENCOUNTER — Other Ambulatory Visit: Payer: Self-pay | Admitting: Adult Health

## 2019-07-03 ENCOUNTER — Telehealth: Payer: Self-pay | Admitting: Obstetrics and Gynecology

## 2019-07-03 NOTE — Telephone Encounter (Signed)

## 2019-07-04 ENCOUNTER — Ambulatory Visit (INDEPENDENT_AMBULATORY_CARE_PROVIDER_SITE_OTHER): Payer: BC Managed Care – PPO

## 2019-07-04 ENCOUNTER — Other Ambulatory Visit: Payer: Self-pay

## 2019-07-04 DIAGNOSIS — Z3042 Encounter for surveillance of injectable contraceptive: Secondary | ICD-10-CM

## 2019-07-04 MED ORDER — MEDROXYPROGESTERONE ACETATE 150 MG/ML IM SUSP
150.0000 mg | Freq: Once | INTRAMUSCULAR | Status: AC
  Administered 2019-07-04: 11:00:00 150 mg via INTRAMUSCULAR

## 2019-07-04 NOTE — Progress Notes (Signed)
Pt here for depo injection 150 mg IM given rt deltoid. Tolerated well. Return 12 weeks for next injection. Pad CMA 

## 2019-09-02 ENCOUNTER — Other Ambulatory Visit: Payer: Self-pay

## 2019-09-02 DIAGNOSIS — Z20822 Contact with and (suspected) exposure to covid-19: Secondary | ICD-10-CM

## 2019-09-03 LAB — NOVEL CORONAVIRUS, NAA: SARS-CoV-2, NAA: NOT DETECTED

## 2019-09-18 ENCOUNTER — Other Ambulatory Visit: Payer: Self-pay | Admitting: Adult Health

## 2019-09-18 ENCOUNTER — Other Ambulatory Visit: Payer: Self-pay | Admitting: Family Medicine

## 2019-09-26 ENCOUNTER — Ambulatory Visit (INDEPENDENT_AMBULATORY_CARE_PROVIDER_SITE_OTHER): Payer: BC Managed Care – PPO | Admitting: *Deleted

## 2019-09-26 ENCOUNTER — Other Ambulatory Visit: Payer: Self-pay

## 2019-09-26 DIAGNOSIS — Z3042 Encounter for surveillance of injectable contraceptive: Secondary | ICD-10-CM | POA: Diagnosis not present

## 2019-09-26 MED ORDER — MEDROXYPROGESTERONE ACETATE 150 MG/ML IM SUSP
150.0000 mg | Freq: Once | INTRAMUSCULAR | Status: AC
  Administered 2019-09-26: 150 mg via INTRAMUSCULAR

## 2019-09-26 NOTE — Progress Notes (Signed)
   NURSE VISIT- INJECTION  SUBJECTIVE:  Samantha Rosales is a 38 y.o. [phone] female here for a Depo Provera for contraception/period management. She is a GYN patient.   OBJECTIVE:  BP (!) 142/90 (BP Location: Left Arm, Patient Position: Sitting, Cuff Size: Normal)   Pulse 87   Appears well, in no apparent distress  Injection administered in: Right deltoid  No orders of the defined types were placed in this encounter.   ASSESSMENT: GYN patient Depo Provera for contraception/period management  PLAN: Follow-up: in 11-13 weeks for next Depo   Katharin Schneider, Celene Squibb  09/26/2019 11:08 AM

## 2019-10-15 ENCOUNTER — Other Ambulatory Visit: Payer: Self-pay

## 2019-10-15 DIAGNOSIS — Z20822 Contact with and (suspected) exposure to covid-19: Secondary | ICD-10-CM

## 2019-10-16 ENCOUNTER — Observation Stay (HOSPITAL_COMMUNITY)
Admission: EM | Admit: 2019-10-16 | Discharge: 2019-10-17 | Disposition: A | Discharge status: Home / Self Care | Payer: BC Managed Care – PPO | Attending: Internal Medicine | Admitting: Internal Medicine

## 2019-10-16 ENCOUNTER — Emergency Department (HOSPITAL_COMMUNITY): Payer: BC Managed Care – PPO

## 2019-10-16 ENCOUNTER — Other Ambulatory Visit: Payer: Self-pay

## 2019-10-16 ENCOUNTER — Ambulatory Visit (HOSPITAL_COMMUNITY)
Admission: EM | Admit: 2019-10-16 | Discharge: 2019-10-16 | Disposition: A | Discharge status: Home / Self Care | Payer: BC Managed Care – PPO | Source: Home / Self Care

## 2019-10-16 ENCOUNTER — Encounter (HOSPITAL_COMMUNITY): Payer: Self-pay | Admitting: Emergency Medicine

## 2019-10-16 DIAGNOSIS — K219 Gastro-esophageal reflux disease without esophagitis: Secondary | ICD-10-CM | POA: Insufficient documentation

## 2019-10-16 DIAGNOSIS — E876 Hypokalemia: Secondary | ICD-10-CM

## 2019-10-16 DIAGNOSIS — Z79899 Other long term (current) drug therapy: Secondary | ICD-10-CM | POA: Diagnosis not present

## 2019-10-16 DIAGNOSIS — I160 Hypertensive urgency: Secondary | ICD-10-CM | POA: Diagnosis present

## 2019-10-16 DIAGNOSIS — I10 Essential (primary) hypertension: Secondary | ICD-10-CM | POA: Diagnosis not present

## 2019-10-16 DIAGNOSIS — E872 Acidosis, unspecified: Secondary | ICD-10-CM

## 2019-10-16 DIAGNOSIS — Z793 Long term (current) use of hormonal contraceptives: Secondary | ICD-10-CM | POA: Insufficient documentation

## 2019-10-16 DIAGNOSIS — M069 Rheumatoid arthritis, unspecified: Secondary | ICD-10-CM | POA: Diagnosis not present

## 2019-10-16 DIAGNOSIS — E039 Hypothyroidism, unspecified: Secondary | ICD-10-CM | POA: Diagnosis not present

## 2019-10-16 DIAGNOSIS — F17210 Nicotine dependence, cigarettes, uncomplicated: Secondary | ICD-10-CM | POA: Diagnosis not present

## 2019-10-16 DIAGNOSIS — R278 Other lack of coordination: Secondary | ICD-10-CM | POA: Insufficient documentation

## 2019-10-16 DIAGNOSIS — R945 Abnormal results of liver function studies: Secondary | ICD-10-CM | POA: Insufficient documentation

## 2019-10-16 DIAGNOSIS — F10239 Alcohol dependence with withdrawal, unspecified: Principal | ICD-10-CM | POA: Insufficient documentation

## 2019-10-16 DIAGNOSIS — F10939 Alcohol use, unspecified with withdrawal, unspecified: Secondary | ICD-10-CM

## 2019-10-16 DIAGNOSIS — R946 Abnormal results of thyroid function studies: Secondary | ICD-10-CM | POA: Insufficient documentation

## 2019-10-16 DIAGNOSIS — F1010 Alcohol abuse, uncomplicated: Secondary | ICD-10-CM

## 2019-10-16 DIAGNOSIS — R0682 Tachypnea, not elsewhere classified: Secondary | ICD-10-CM | POA: Insufficient documentation

## 2019-10-16 DIAGNOSIS — Z880 Allergy status to penicillin: Secondary | ICD-10-CM | POA: Diagnosis not present

## 2019-10-16 DIAGNOSIS — Z8249 Family history of ischemic heart disease and other diseases of the circulatory system: Secondary | ICD-10-CM | POA: Diagnosis not present

## 2019-10-16 DIAGNOSIS — Z720 Tobacco use: Secondary | ICD-10-CM

## 2019-10-16 DIAGNOSIS — E860 Dehydration: Secondary | ICD-10-CM | POA: Insufficient documentation

## 2019-10-16 DIAGNOSIS — Z20828 Contact with and (suspected) exposure to other viral communicable diseases: Secondary | ICD-10-CM | POA: Diagnosis not present

## 2019-10-16 DIAGNOSIS — R7989 Other specified abnormal findings of blood chemistry: Secondary | ICD-10-CM | POA: Diagnosis present

## 2019-10-16 DIAGNOSIS — R410 Disorientation, unspecified: Secondary | ICD-10-CM | POA: Diagnosis present

## 2019-10-16 LAB — COMPREHENSIVE METABOLIC PANEL
ALT: 47 U/L — ABNORMAL HIGH (ref 0–44)
AST: 72 U/L — ABNORMAL HIGH (ref 15–41)
Albumin: 4.3 g/dL (ref 3.5–5.0)
Alkaline Phosphatase: 110 U/L (ref 38–126)
Anion gap: 16 — ABNORMAL HIGH (ref 5–15)
BUN: 6 mg/dL (ref 6–20)
CO2: 23 mmol/L (ref 22–32)
Calcium: 9.1 mg/dL (ref 8.9–10.3)
Chloride: 106 mmol/L (ref 98–111)
Creatinine, Ser: 0.64 mg/dL (ref 0.44–1.00)
GFR calc Af Amer: >60 mL/min (ref >60)
GFR calc non Af Amer: >60 mL/min (ref >60)
Glucose, Bld: 126 mg/dL — ABNORMAL HIGH (ref 70–99)
Potassium: 3 mmol/L — ABNORMAL LOW (ref 3.5–5.1)
Sodium: 145 mmol/L (ref 135–145)
Total Bilirubin: 0.6 mg/dL (ref 0.3–1.2)
Total Protein: 8.2 g/dL — ABNORMAL HIGH (ref 6.5–8.1)

## 2019-10-16 LAB — CBC WITH DIFFERENTIAL/PLATELET
Abs Immature Granulocytes: 0.06 10*3/uL (ref 0.00–0.07)
Basophils Absolute: 0 10*3/uL (ref 0.0–0.1)
Basophils Relative: 1 %
Eosinophils Absolute: 0 10*3/uL (ref 0.0–0.5)
Eosinophils Relative: 0 %
HCT: 34.9 % — ABNORMAL LOW (ref 36.0–46.0)
Hemoglobin: 12.2 g/dL (ref 12.0–15.0)
Immature Granulocytes: 1 %
Lymphocytes Relative: 50 %
Lymphs Abs: 2.2 10*3/uL (ref 0.7–4.0)
MCH: 34.3 pg — ABNORMAL HIGH (ref 26.0–34.0)
MCHC: 35 g/dL (ref 30.0–36.0)
MCV: 98 fL (ref 80.0–100.0)
Monocytes Absolute: 0.4 10*3/uL (ref 0.1–1.0)
Monocytes Relative: 9 %
Neutro Abs: 1.7 10*3/uL (ref 1.7–7.7)
Neutrophils Relative %: 39 %
Platelets: 224 10*3/uL (ref 150–400)
RBC: 3.56 MIL/uL — ABNORMAL LOW (ref 3.87–5.11)
RDW: 14.1 % (ref 11.5–15.5)
WBC: 4.4 10*3/uL (ref 4.0–10.5)
nRBC: 0 % (ref 0.0–0.2)

## 2019-10-16 LAB — RAPID URINE DRUG SCREEN, HOSP PERFORMED
Amphetamines: NOT DETECTED
Barbiturates: NOT DETECTED
Benzodiazepines: NOT DETECTED
Cocaine: NOT DETECTED
Opiates: NOT DETECTED
Tetrahydrocannabinol: NOT DETECTED

## 2019-10-16 LAB — URINALYSIS, ROUTINE W REFLEX MICROSCOPIC
Bilirubin Urine: NEGATIVE
Glucose, UA: NEGATIVE mg/dL
Hgb urine dipstick: NEGATIVE
Ketones, ur: NEGATIVE mg/dL
Leukocytes,Ua: NEGATIVE
Nitrite: NEGATIVE
Protein, ur: NEGATIVE mg/dL
Specific Gravity, Urine: 1.008 (ref 1.005–1.030)
pH: 8 (ref 5.0–8.0)

## 2019-10-16 LAB — LACTIC ACID, PLASMA
Lactic Acid, Venous: 3.4 mmol/L (ref 0.5–1.9)
Lactic Acid, Venous: 4.2 mmol/L (ref 0.5–1.9)

## 2019-10-16 LAB — I-STAT BETA HCG BLOOD, ED (MC, WL, AP ONLY): I-stat hCG, quantitative: <5 m[IU]/mL (ref <5)

## 2019-10-16 LAB — AMMONIA: Ammonia: 34 umol/L (ref 9–35)

## 2019-10-16 LAB — NOVEL CORONAVIRUS, NAA: SARS-CoV-2, NAA: NOT DETECTED

## 2019-10-16 LAB — MAGNESIUM: Magnesium: 1.3 mg/dL — ABNORMAL LOW (ref 1.7–2.4)

## 2019-10-16 LAB — T4, FREE: Free T4: 0.54 ng/dL — ABNORMAL LOW (ref 0.61–1.12)

## 2019-10-16 LAB — ETHANOL: Alcohol, Ethyl (B): <10 mg/dL (ref <10)

## 2019-10-16 LAB — TROPONIN I (HIGH SENSITIVITY): Troponin I (High Sensitivity): 5 ng/L (ref <18)

## 2019-10-16 LAB — TSH: TSH: 6.058 u[IU]/mL — ABNORMAL HIGH (ref 0.350–4.500)

## 2019-10-16 IMAGING — CR DG CHEST 2V
2 series · 2 of 2 positions shown · non-contrast
Comparison: [DATE]

CLINICAL DATA: Dizziness.

EXAM:
CHEST - 2 VIEW

[chest pa]
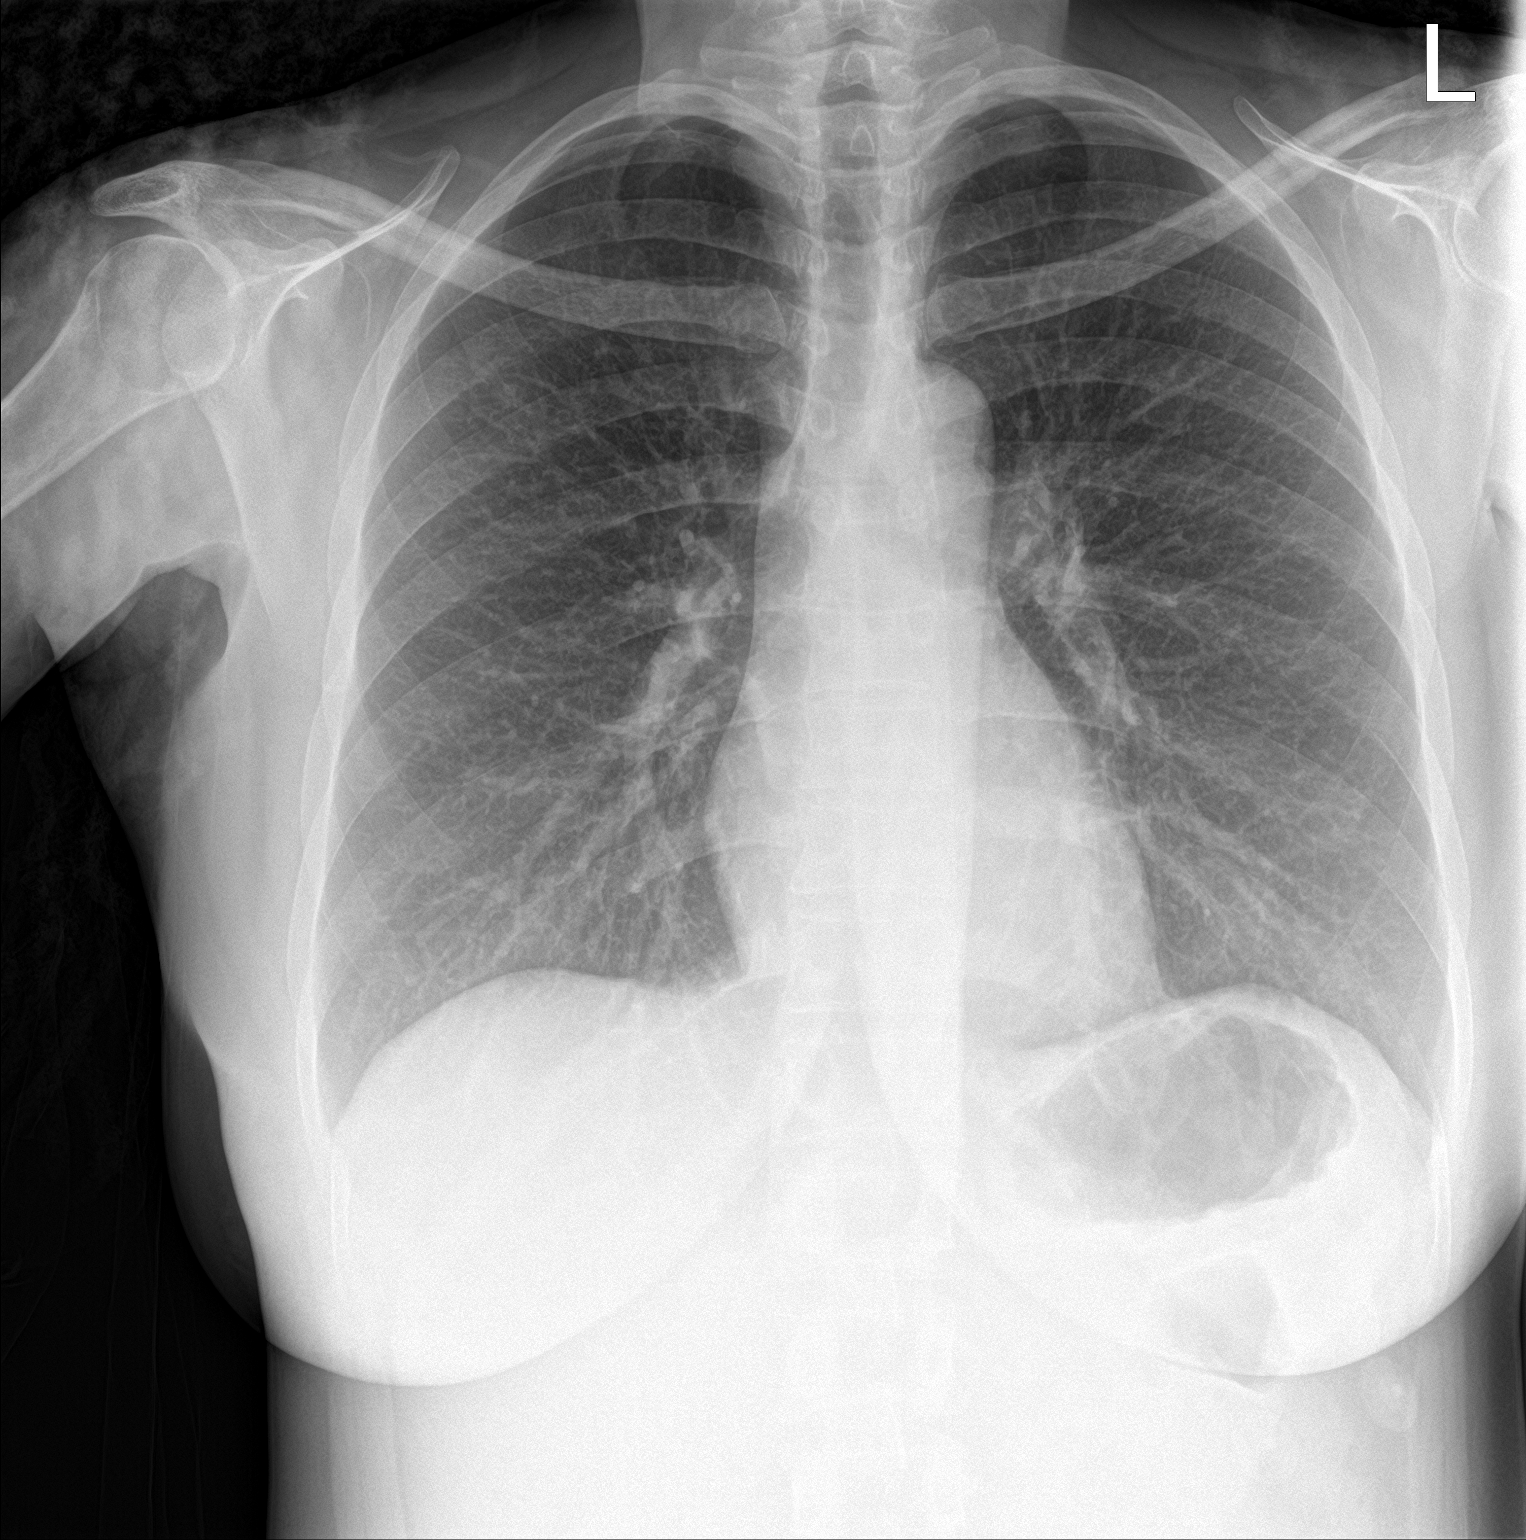

[chest lat]
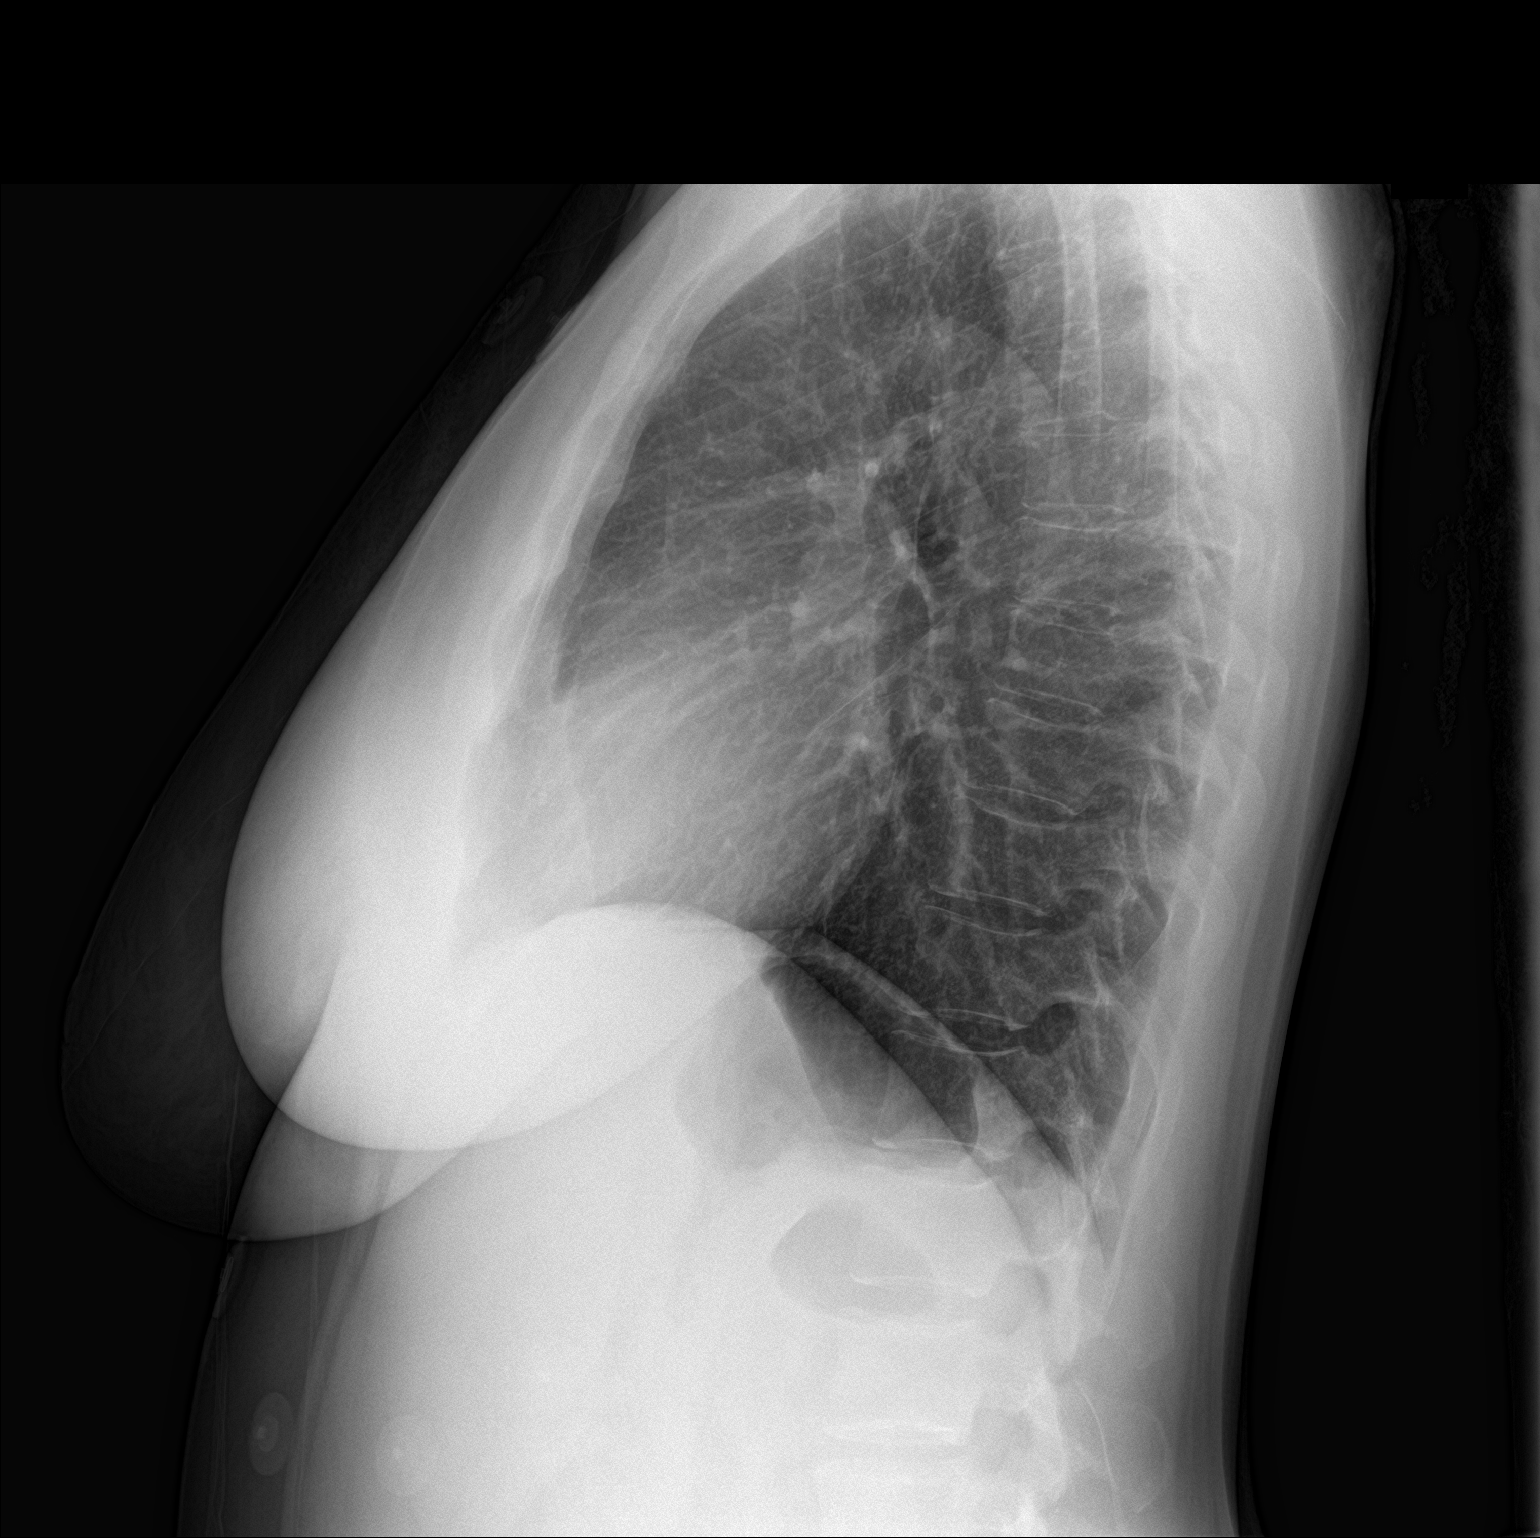

[2 of 2 positions shown; findings below may reference images not displayed]

FINDINGS: The heart size and mediastinal contours are within normal limits.
Both lungs are clear. The visualized skeletal structures are
unremarkable.
IMPRESSION: No active cardiopulmonary disease.

## 2019-10-16 IMAGING — CT CT HEAD W/O CM
4 series · 16 of 47 positions shown, 18 images · non-contrast
Comparison: None.

CLINICAL DATA: Altered level of consciousness

EXAM:
CT HEAD WITHOUT CONTRAST
TECHNIQUE: Contiguous axial images were obtained from the base of the skull
through the vertex without intravenous contrast.

[Series 3: head without · axial · non-contrast · 0.44mm/px · z∈[-238,-114]mm · 7 of 35 slices shown, 9 images]
[im 5/35  brain]
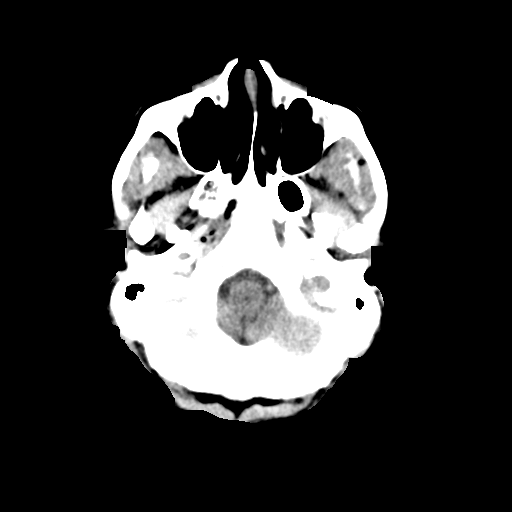
[im 5/35  bone]
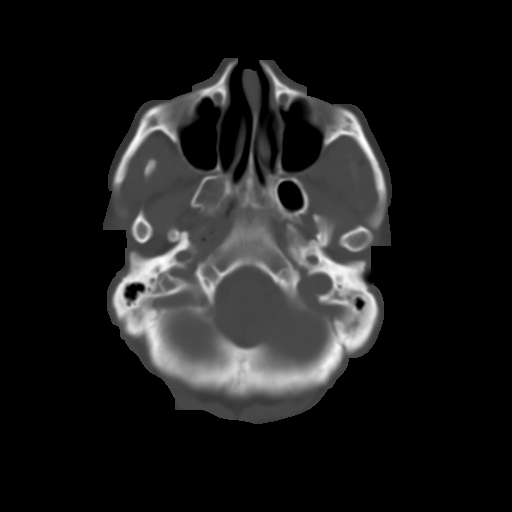
[im 9/35  brain]
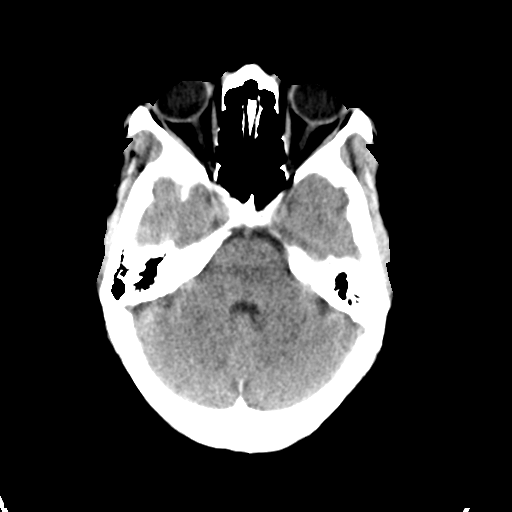
[im 13/35  brain]
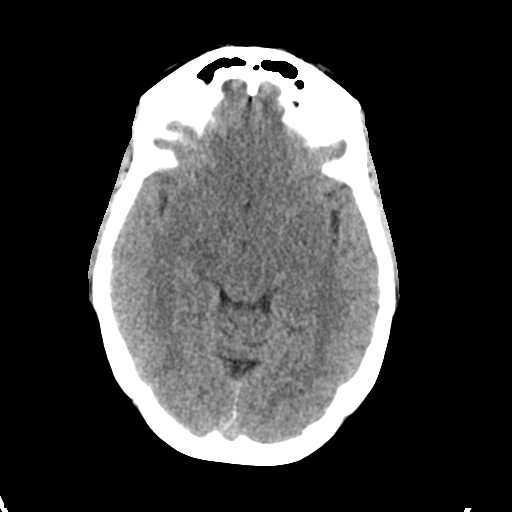
[im 18/35  brain]
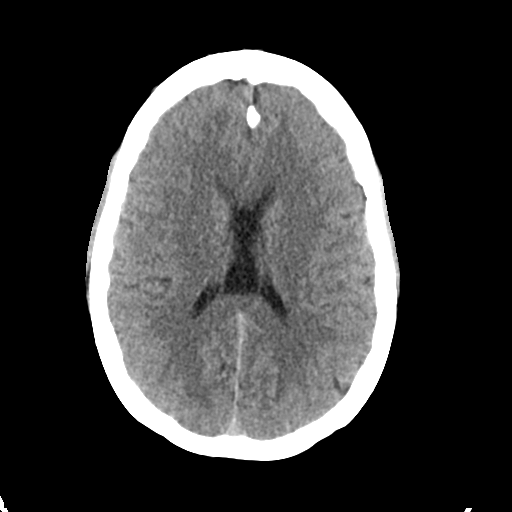
[im 22/35  brain]
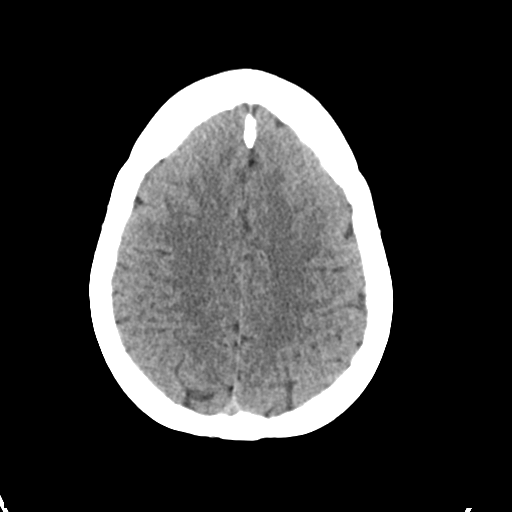
[im 22/35  bone]
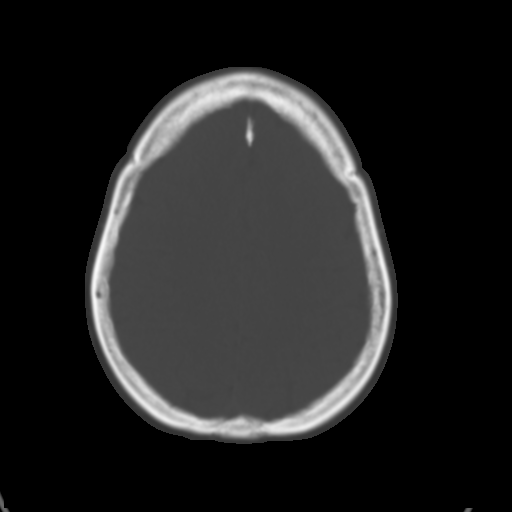
[im 26/35  brain]
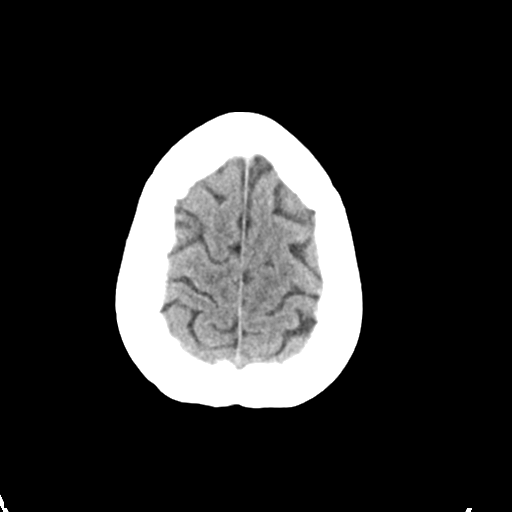
[im 30/35  brain]
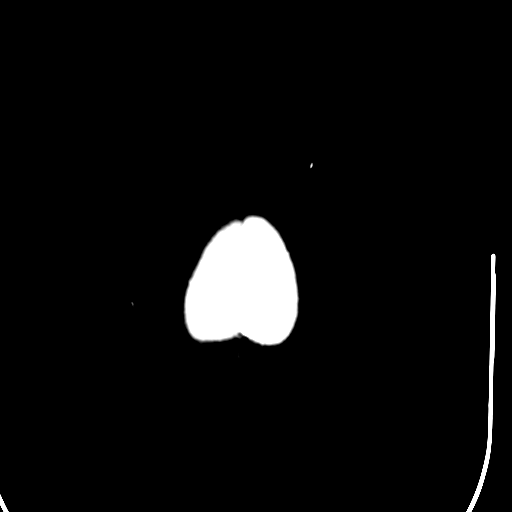

[Series 4: head bone · axial · 0.44mm/px · z∈[-242,-208]mm · 3 of 86 slices shown]
[im 9/86  bone]
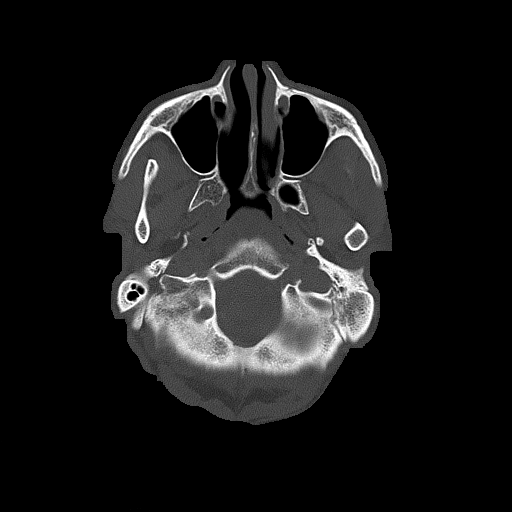
[im 18/86  bone]
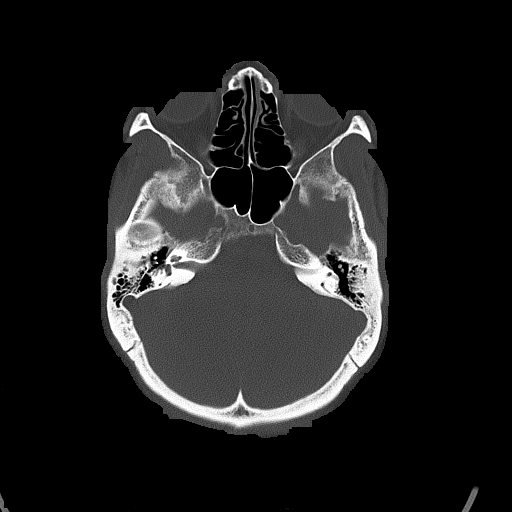
[im 26/86  bone]
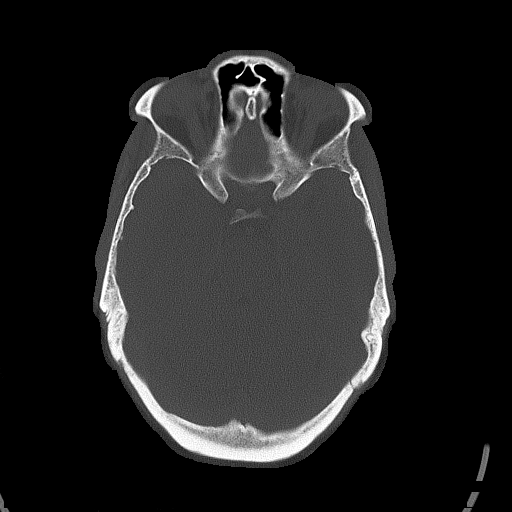

[Series 5: head without cor · coronal · non-contrast · 0.33mm/px · 3 of 67 slices shown]
[im 23/67  brain]
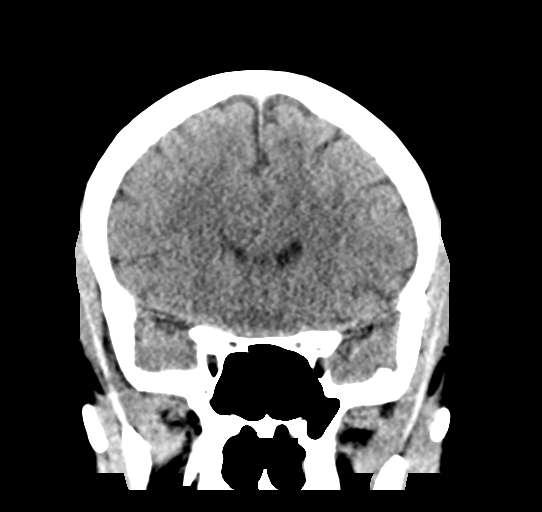
[im 30/67  brain]
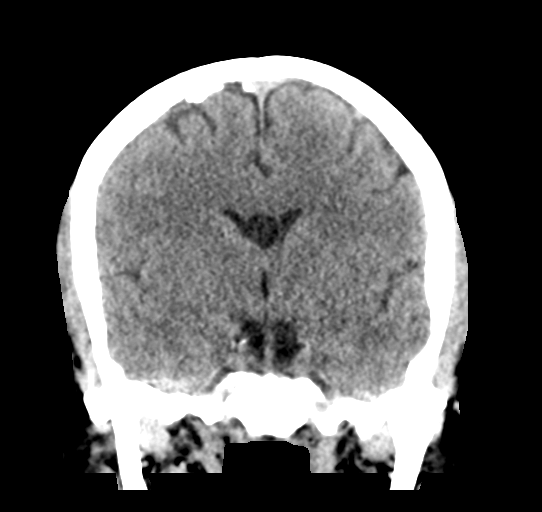
[im 37/67  brain]
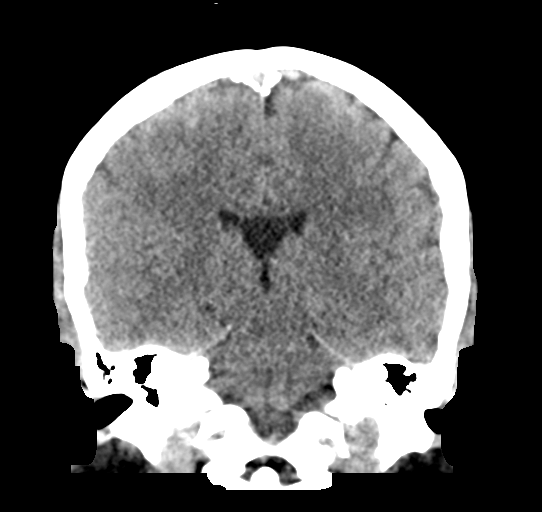

[Series 6: head without sag · sagittal · non-contrast · 0.31mm/px · 3 of 60 slices shown]
[im 20/60  brain]
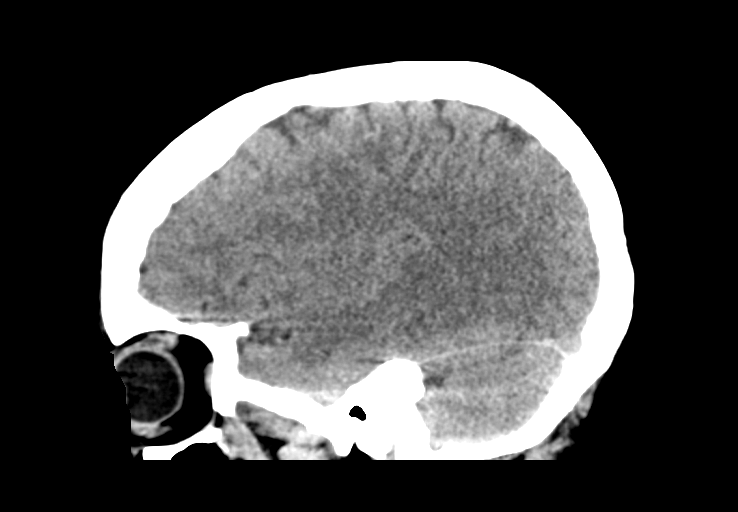
[im 30/60  brain]
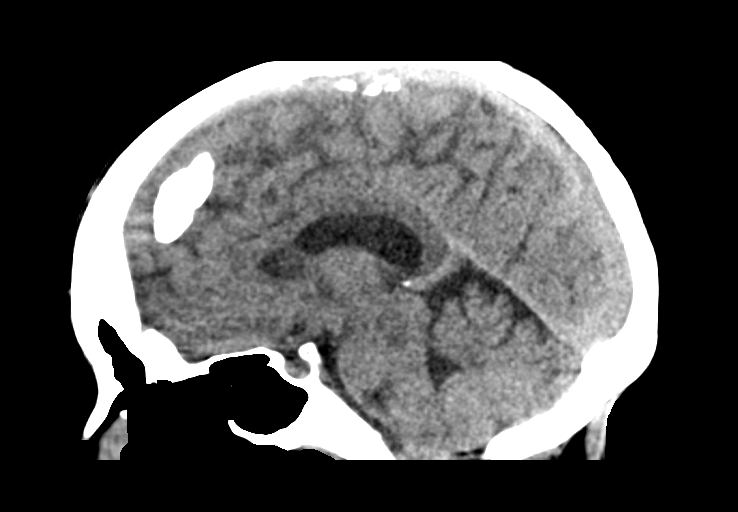
[im 40/60  brain]
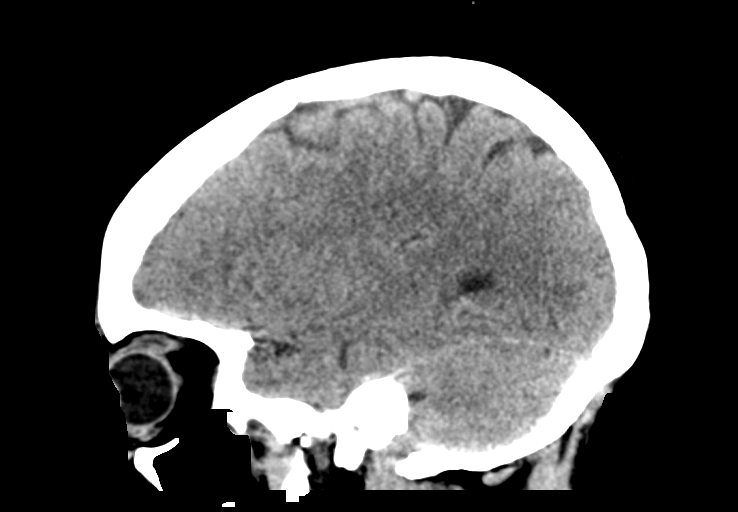

[16 of 47 positions shown; findings below may reference images not displayed]

FINDINGS: Brain: No evidence of acute territorial infarction, hemorrhage,
hydrocephalus,extra-axial collection or mass lesion/mass effect.
Normal gray-white differentiation. Ventricles are normal in size and
contour.

Vascular: No hyperdense vessel or unexpected calcification.

Skull: The skull is intact. No fracture or focal lesion identified.

Sinuses/Orbits: The visualized paranasal sinuses and mastoid air
cells are clear. The orbits and globes intact.

Other: None
IMPRESSION: No acute intracranial abnormality.

## 2019-10-16 MED ORDER — LORAZEPAM 2 MG/ML IJ SOLN: 2.0000 mg | Freq: Once | INTRAMUSCULAR | Status: DC

## 2019-10-16 MED ORDER — LACTATED RINGERS IV BOLUS
1000.0000 mL | Freq: Once | INTRAVENOUS | Status: AC
  Administered 2019-10-16: 1000 mL via INTRAVENOUS

## 2019-10-16 MED ORDER — LABETALOL HCL 5 MG/ML IV SOLN
10.0000 mg | Freq: Once | INTRAVENOUS | Status: AC
  Administered 2019-10-16: 10 mg via INTRAVENOUS
  Filled 2019-10-16: qty 4

## 2019-10-16 MED ORDER — POTASSIUM CHLORIDE CRYS ER 20 MEQ PO TBCR
40.0000 meq | EXTENDED_RELEASE_TABLET | Freq: Once | ORAL | Status: AC
  Administered 2019-10-16: 40 meq via ORAL
  Filled 2019-10-16: qty 2

## 2019-10-16 MED ORDER — HYDRALAZINE HCL 20 MG/ML IJ SOLN
10.0000 mg | Freq: Once | INTRAMUSCULAR | Status: AC
  Administered 2019-10-16: 10 mg via INTRAVENOUS
  Filled 2019-10-16: qty 1

## 2019-10-16 MED ORDER — LORAZEPAM 2 MG/ML IJ SOLN
1.0000 mg | INTRAMUSCULAR | Status: DC | PRN
  Administered 2019-10-16: 22:00:00 2 mg via INTRAVENOUS
  Filled 2019-10-16: qty 1

## 2019-10-16 MED ORDER — LORAZEPAM 1 MG PO TABS: 1.0000 mg | ORAL_TABLET | ORAL | Status: DC | PRN

## 2019-10-16 MED ORDER — MAGNESIUM SULFATE 2 GM/50ML IV SOLN
2.0000 g | Freq: Once | INTRAVENOUS | Status: AC
  Administered 2019-10-16: 2 g via INTRAVENOUS
  Filled 2019-10-16: qty 50

## 2019-10-16 NOTE — ED Triage Notes (Signed)
Patient c/o dizziness today, decreased appetite for a couple days. Denies any pain anywhere. States she has not been around anyone sick. Patient alert and orientated x 4. Right arm and trunk shaking during triage.

## 2019-10-16 NOTE — ED Notes (Addendum)
Pt here with family stating that she became very shaky and disoriented today.  Pt was tearful and was not a great historian.  Pt's family stating she has not taken any of her medications today. They had a pill box with them full of meds.  No one was able to tell me what she is taking, including the pt.  I explained to them that we would be happy to see them, a provider would assess her and determine whether she would need to go to the ED or not.  They decided to go to the ED instead.  Pt was ambulating well from the facility, holding on to a family members arm.  Informed Dr. Mannie Stabile of my conversation with the pt and family when they decided to leave the facility to go to the ED.

## 2019-10-16 NOTE — ED Notes (Signed)
CRITICAL VALUE ALERT  Critical Value:  Lactic acid 3.5  Date & Time Notied:  10/16/2019   Provider Notified: Dr. Sherry Ruffing

## 2019-10-16 NOTE — ED Provider Notes (Signed)
[facility] Provider Note   CSN: [id number] Arrival date & time: 10/16/19  1631     History   Chief Complaint Chief Complaint  Patient presents with  . Dizziness    HPI Samantha Rosales is a 38 y.o. female.  HPI  Patient presents after becoming very shaky and disoriented starting around 3 PM earlier today.  She states that this is never happened before, and she has no symptoms beyond dizziness, shakes, chills.  She states that she feels confused, she has no pain, denies fevers, nausea, emesis.  She states that she has a history of hypertension, for which she has not been taking her medications for.  She denies any blurry vision, abdominal pain.  No medications were tried prior to arrival, nothing seems to make symptoms better or worse.  Past Medical History:  Diagnosis Date  . Abnormal Pap smear   . Alcohol use   . Allergic rhinitis   . BV (bacterial vaginosis)   . GERD (gastroesophageal reflux disease)   . High cholesterol   . HSV-2 (herpes simplex virus 2) infection   . Hypertension   . Rheumatoid arthritis ([place])   . Seizures ([place])    dx as child with epilepsy but no seizures since 89 and on no meds  . Vaginal Pap smear, abnormal     Patient Active Problem List   Diagnosis Date Noted  . Abnormal LFTs 09/05/2018  . Elevated lipase 09/05/2018  . History of methotrexate therapy 09/05/2018  . High triglycerides 09/05/2018  . Elevated liver enzymes 07/01/2018  . Essential hypertension 07/01/2018  . Encounter for well woman exam with routine gynecological exam 07/01/2018  . History of abnormal cervical Pap smear 06/26/2017  . Encounter for surveillance of injectable contraceptive 06/26/2017  . Encounter for gynecological examination with Papanicolaou smear of cervix 06/26/2017  . Arthritis, rheumatic, acute or subacute 11/18/2013  . Urticaria, idiopathic 08/24/2013  . HSV-2 (herpes simplex virus 2) infection 05/29/2013  . HPV  (human papilloma virus) infection 05/29/2013    Past Surgical History:  Procedure Laterality Date  . CERVICAL CONIZATION W/BX  12/20/2012   Procedure: CONIZATION CERVIX WITH BIOPSY;  Surgeon: Florian Buff, MD;  Location: [facility];  Service: Gynecology;  Laterality: N/A;  . HOLMIUM LASER APPLICATION  2/70/3500   Procedure: HOLMIUM LASER APPLICATION;  Surgeon: Florian Buff, MD;  Location: [facility];  Service: Gynecology;  Laterality: N/A;     OB History    Gravida  4   Para  2   Term  2   Preterm      AB  2   Living  2     SAB  1   TAB  1   Ectopic      Multiple      Live Births  2            Home Medications    Prior to Admission medications   Medication Sig Start Date End Date Taking? Authorizing Provider  amoxicillin (AMOXIL) 500 MG capsule Take 500 mg by mouth 3 (three) times daily. 10/08/19  Yes [provider]  azithromycin (ZITHROMAX) 250 MG tablet Take 250-500 mg by mouth See admin instructions. Take 500mg  on day one and then 250mg  on days 2 through 4. 10/08/19  Yes [provider]  Biotin 1000 MCG tablet Take 1,000 mcg by mouth daily.   Yes [provider]  Cholecalciferol (VITAMIN D-3) 1000 UNITS CAPS Take 1 capsule by mouth  daily.   Yes [provider]  escitalopram (LEXAPRO) 10 MG tablet TAKE 1 TABLET BY MOUTH EVERY DAY Patient taking differently: Take 10 mg by mouth daily.  09/18/19  Yes Donita BrooksPickard, Warren T, MD  fenofibrate micronized (LOFIBRA) 200 MG capsule Take 1 capsule (200 mg total) by mouth at bedtime. 06/10/19  Yes Danelle Berryapia, Leisa, PA-C  fluticasone (FLONASE) 50 MCG/ACT nasal spray USE TWO SPRAY(S) IN EACH NOSTRIL ONCE DAILY Patient taking differently: Place 2 sprays into both nostrils daily.  06/21/17  Yes Donita BrooksPickard, Warren T, MD  folic acid (FOLVITE) 1 MG tablet Take 1 mg by mouth daily.   Yes [provider]  HUMIRA PEN 40 MG/0.8ML PNKT Inject 1 Dose into the skin every 14 (fourteen) days.    Yes [provider]  Icosapent Ethyl (VASCEPA) 1 g CAPS Take 2 capsules (2 g total) by mouth 2 (two) times daily. 09/05/18  Yes Donita BrooksPickard, Warren T, MD  lipase/protease/amylase (CREON) 12000 units CPEP capsule Take 1 capsule (12,000 Units total) by mouth 3 (three) times daily before meals. 06/11/19  Yes Danelle Berryapia, Leisa, PA-C  medroxyPROGESTERone (DEPO-PROVERA) 150 MG/ML injection INJECT 1 ML INTRAMUSCULARLY IN DOCTOR'S OFFICE EVERY 3 MONTHS. Patient taking differently: INJECT 1 ML INTRAMUSCULARLY IN DOCTOR&#39;S OFFICE EVERY 3 MONTHS. 09/18/19  Yes Cyril MourningGriffin, Jennifer A, NP  nebivolol (BYSTOLIC) 10 MG tablet Take 1 tablet (10 mg total) by mouth daily. 03/27/18  Yes Donita BrooksPickard, Warren T, MD  pantoprazole (PROTONIX) 40 MG tablet TAKE 1 TABLET BY MOUTH TWICE DAILY Patient taking differently: Take 40 mg by mouth daily.  12/16/18  Yes Mansouraty, Netty StarringGabriel Jr., MD  SUMAtriptan (IMITREX) 50 MG tablet Take 1 tablet (50 mg total) by mouth every 2 (two) hours as needed for migraine. May repeat in 2 hours if headache persists or recurs. 10/25/18  Yes Donita BrooksPickard, Warren T, MD  HYDROcodone-acetaminophen (NORCO/VICODIN) 5-325 MG tablet Take 1-2 tablets by mouth every 4 (four) hours as needed for severe pain. Patient not taking: Reported on 09/26/2019 06/04/19   Elpidio AnisUpstill, Shari, PA-C  losartan (COZAAR) 50 MG tablet Take 1 tablet (50 mg total) by mouth daily. Patient not taking: Reported on 10/16/2019 12/24/18   Salley Scarleturham, Kawanta F, MD  ondansetron (ZOFRAN ODT) 4 MG disintegrating tablet Take 1 tablet (4 mg total) by mouth every 8 (eight) hours as needed for nausea or vomiting. Patient not taking: Reported on 06/10/2019 06/04/19   Elpidio AnisUpstill, Shari, PA-C    Family History Family History  Problem Relation Age of Onset  . Hypertension Mother   . Hypertension Father   . Pancreatic cancer Paternal Uncle   . Colon cancer Neg Hx   . Esophageal cancer Neg Hx   . Inflammatory bowel disease Neg Hx   . Liver disease Neg Hx   . Rectal cancer Neg Hx    . Stomach cancer Neg Hx     Social History Social History   Tobacco Use  . Smoking status: Current Every Day Smoker    Packs/day: 0.50    Years: 15.00    Pack years: 7.50    Types: Cigarettes  . Smokeless tobacco: Never Used  Substance Use Topics  . Alcohol use: Yes    Comment: on the weekends drinks with friends  . Drug use: No     Allergies   Penicillins   Review of Systems Review of Systems  Constitutional: Negative for fever.  Respiratory: Negative for cough and shortness of breath.   Cardiovascular: Negative for chest pain.  Gastrointestinal: Negative for abdominal  pain, nausea and vomiting.  Neurological: Positive for dizziness and tremors. Negative for seizures, syncope, facial asymmetry and weakness.  All other systems reviewed and are negative.    Physical Exam Updated Vital Signs BP (!) 154/96   Pulse 92   Temp 99 F (37.2 C) (Rectal)   Resp 20   SpO2 100%   Physical Exam Vitals signs and nursing note reviewed.  Constitutional:      Appearance: She is well-developed.  HENT:     Head: Normocephalic and atraumatic.  Eyes:     Extraocular Movements: Extraocular movements intact.     Conjunctiva/sclera: Conjunctivae normal.     Pupils: Pupils are equal, round, and reactive to light.  Neck:     Musculoskeletal: Neck supple.  Cardiovascular:     Rate and Rhythm: Regular rhythm. Tachycardia present.     Heart sounds: No murmur.     Comments: Hypertensive and tachycardic Pulmonary:     Effort: Pulmonary effort is normal. No respiratory distress.     Breath sounds: Normal breath sounds.  Abdominal:     General: There is no distension.     Palpations: Abdomen is soft.     Tenderness: There is no abdominal tenderness. There is no guarding or rebound.     Comments: No abdominal tenderness to palpation, no peritoneal signs  Skin:    General: Skin is warm and dry.  Neurological:     Mental Status: She is alert.     Comments: Patient has tremors in  bilateral upper extremities and in her trunk.  Intact peripheral vision without peripheral field vision loss.      ED Treatments / Results  Labs (all labs ordered are listed, but only abnormal results are displayed) Labs Reviewed  LACTIC ACID, PLASMA - Abnormal; Notable for the following components:      Result Value   Lactic Acid, Venous 3.4 (*)    All other components within normal limits  LACTIC ACID, PLASMA - Abnormal; Notable for the following components:   Lactic Acid, Venous 4.2 (*)    All other components within normal limits  COMPREHENSIVE METABOLIC PANEL - Abnormal; Notable for the following components:   Potassium 3.0 (*)    Glucose, Bld 126 (*)    Total Protein 8.2 (*)    AST 72 (*)    ALT 47 (*)    Anion gap 16 (*)    All other components within normal limits  CBC WITH DIFFERENTIAL/PLATELET - Abnormal; Notable for the following components:   RBC 3.56 (*)    HCT 34.9 (*)    MCH 34.3 (*)    All other components within normal limits  URINALYSIS, ROUTINE W REFLEX MICROSCOPIC - Abnormal; Notable for the following components:   Color, Urine STRAW (*)    All other components within normal limits  MAGNESIUM - Abnormal; Notable for the following components:   Magnesium 1.3 (*)    All other components within normal limits  TSH - Abnormal; Notable for the following components:   TSH 6.058 (*)    All other components within normal limits  T4, FREE - Abnormal; Notable for the following components:   Free T4 0.54 (*)    All other components within normal limits  SARS CORONAVIRUS 2 (TAT 6-24 HRS)  AMMONIA  RAPID URINE DRUG SCREEN, HOSP PERFORMED  ETHANOL  I-STAT BETA HCG BLOOD, ED ([facility], [facility], [facility] ONLY)  TROPONIN I (HIGH SENSITIVITY)    EKG EKG Interpretation  Date/Time:  Thursday October 16 2019 16:50:08 EST Ventricular Rate:  95 PR Interval:  120 QRS Duration: 64 QT Interval:  374 QTC Calculation: 469 R Axis:   72 Text Interpretation: Normal sinus rhythm Normal  ECG No prior ECG for comparison. No STEMI Confirmed by Theda Belfastegeler, Chris ([id number]) on 10/16/2019 6:55:56 PM   Radiology Dg Chest 2 View  Result Date: 10/16/2019 CLINICAL DATA:  Dizziness. EXAM: CHEST - 2 VIEW COMPARISON:  December 16, 2008 FINDINGS: The heart size and mediastinal contours are within normal limits. Both lungs are clear. The visualized skeletal structures are unremarkable. IMPRESSION: No active cardiopulmonary disease. Electronically Signed   By: Katherine Mantlehristopher  Green M.D.   On: 10/16/2019 18:16   Ct Head Wo Contrast  Result Date: 10/16/2019 CLINICAL DATA:  Altered level of consciousness EXAM: CT HEAD WITHOUT CONTRAST TECHNIQUE: Contiguous axial images were obtained from the base of the skull through the vertex without intravenous contrast. COMPARISON:  None. FINDINGS: Brain: No evidence of acute territorial infarction, hemorrhage, hydrocephalus,extra-axial collection or mass lesion/mass effect. Normal gray-white differentiation. Ventricles are normal in size and contour. Vascular: No hyperdense vessel or unexpected calcification. Skull: The skull is intact. No fracture or focal lesion identified. Sinuses/Orbits: The visualized paranasal sinuses and mastoid air cells are clear. The orbits and globes intact. Other: None IMPRESSION: No acute intracranial abnormality. Electronically Signed   By: Jonna ClarkBindu  Avutu M.D.   On: 10/16/2019 19:45    Procedures Procedures (including critical care time)  Medications Ordered in ED Medications  LORazepam (ATIVAN) tablet 1-4 mg ( Oral See Alternative 10/16/19 2141)    Or  LORazepam (ATIVAN) injection 1-4 mg (2 mg Intravenous Given 10/16/19 2141)  lactated ringers bolus 1,000 mL (has no administration in time range)  potassium chloride SA (KLOR-CON) CR tablet 40 mEq (40 mEq Oral Given 10/16/19 2005)  hydrALAZINE (APRESOLINE) injection 10 mg (10 mg Intravenous Given 10/16/19 2008)  lactated ringers bolus 1,000 mL (0 mLs Intravenous Stopped 10/16/19 2229)   magnesium sulfate IVPB 2 g 50 mL (0 g Intravenous Stopped 10/16/19 2229)  labetalol (NORMODYNE) injection 10 mg (10 mg Intravenous Given 10/16/19 2126)     Initial Impression / Assessment and Plan / ED Course  I have reviewed the triage vital signs and the nursing notes.  Pertinent labs & imaging results that were available during my care of the patient were reviewed by me and considered in my medical decision making (see chart for details).     HEAR Score: 1  Koni M Riehle is a 38 y.o. female with a past medical history of abnormal LFTs, hypertension presenting today for confusion, tremors, dizziness.  Labs and imaging ordered for differential diagnosis of intracranial abnormality, electrolyte abnormality, thyroid abnormality, sepsis, hyperammonemia, UTI, pregnancy.  Chest x-ray and CT scan showed no acute abnormality.  Elevated thyroid function, decreased magnesium, anion gap metabolic acidosis present without elevated glucose, elevated liver enzymes, normal creatinine, normal BUN.  Initial CIWA is 13, alcohol level is pending, plan is to admit to medicine for further evaluation and management.  CIWA protocol initiated, Ativan given.  Care of patient discussed with the supervising attending.  Final Clinical Impressions(s) / ED Diagnoses   Final diagnoses:  Confusion  Elevated TSH  Hypertension, unspecified type    ED Discharge Orders    None       Chester HolsteinVaithi, Esbeidy Mclaine, MD 10/16/19 2324    Tegeler, Canary Brimhristopher J, MD 10/18/19 0151

## 2019-10-17 ENCOUNTER — Encounter (HOSPITAL_COMMUNITY): Payer: Self-pay

## 2019-10-17 DIAGNOSIS — I160 Hypertensive urgency: Secondary | ICD-10-CM

## 2019-10-17 DIAGNOSIS — E876 Hypokalemia: Secondary | ICD-10-CM

## 2019-10-17 DIAGNOSIS — I10 Essential (primary) hypertension: Secondary | ICD-10-CM | POA: Diagnosis not present

## 2019-10-17 DIAGNOSIS — E872 Acidosis, unspecified: Secondary | ICD-10-CM

## 2019-10-17 DIAGNOSIS — F1010 Alcohol abuse, uncomplicated: Secondary | ICD-10-CM | POA: Diagnosis not present

## 2019-10-17 DIAGNOSIS — F10239 Alcohol dependence with withdrawal, unspecified: Secondary | ICD-10-CM

## 2019-10-17 DIAGNOSIS — F10230 Alcohol dependence with withdrawal, uncomplicated: Secondary | ICD-10-CM

## 2019-10-17 DIAGNOSIS — E039 Hypothyroidism, unspecified: Secondary | ICD-10-CM

## 2019-10-17 DIAGNOSIS — Z720 Tobacco use: Secondary | ICD-10-CM

## 2019-10-17 DIAGNOSIS — R945 Abnormal results of liver function studies: Secondary | ICD-10-CM | POA: Diagnosis not present

## 2019-10-17 DIAGNOSIS — F10939 Alcohol use, unspecified with withdrawal, unspecified: Secondary | ICD-10-CM

## 2019-10-17 DIAGNOSIS — R410 Disorientation, unspecified: Secondary | ICD-10-CM

## 2019-10-17 HISTORY — DX: Hypothyroidism, unspecified: E03.9

## 2019-10-17 LAB — CREATININE, SERUM
Creatinine, Ser: 0.63 mg/dL (ref 0.44–1.00)
GFR calc Af Amer: >60 mL/min (ref >60)
GFR calc non Af Amer: >60 mL/min (ref >60)

## 2019-10-17 LAB — CBC
HCT: 32.2 % — ABNORMAL LOW (ref 36.0–46.0)
HCT: 33.1 % — ABNORMAL LOW (ref 36.0–46.0)
Hemoglobin: 10.9 g/dL — ABNORMAL LOW (ref 12.0–15.0)
Hemoglobin: 11.3 g/dL — ABNORMAL LOW (ref 12.0–15.0)
MCH: 33.9 pg (ref 26.0–34.0)
MCH: 34.6 pg — ABNORMAL HIGH (ref 26.0–34.0)
MCHC: 33.9 g/dL (ref 30.0–36.0)
MCHC: 34.1 g/dL (ref 30.0–36.0)
MCV: 100 fL (ref 80.0–100.0)
MCV: 101.2 fL — ABNORMAL HIGH (ref 80.0–100.0)
Platelets: 204 10*3/uL (ref 150–400)
Platelets: 253 10*3/uL (ref 150–400)
RBC: 3.22 MIL/uL — ABNORMAL LOW (ref 3.87–5.11)
RBC: 3.27 MIL/uL — ABNORMAL LOW (ref 3.87–5.11)
RDW: 14.6 % (ref 11.5–15.5)
RDW: 14.8 % (ref 11.5–15.5)
WBC: 7.7 10*3/uL (ref 4.0–10.5)
WBC: 6.1 10*3/uL (ref 4.0–10.5)
nRBC: 0 % (ref 0.0–0.2)
nRBC: 0 % (ref 0.0–0.2)

## 2019-10-17 LAB — SARS CORONAVIRUS 2 (TAT 6-24 HRS): SARS Coronavirus 2: NEGATIVE

## 2019-10-17 LAB — LACTIC ACID, PLASMA: Lactic Acid, Venous: 2.8 mmol/L (ref 0.5–1.9)

## 2019-10-17 LAB — PHOSPHORUS: Phosphorus: 4.1 mg/dL (ref 2.5–4.6)

## 2019-10-17 LAB — MAGNESIUM: Magnesium: 1.9 mg/dL (ref 1.7–2.4)

## 2019-10-17 LAB — HIV ANTIBODY (ROUTINE TESTING W REFLEX): HIV Screen 4th Generation wRfx: NONREACTIVE

## 2019-10-17 LAB — TSH: TSH: 5.562 u[IU]/mL — ABNORMAL HIGH (ref 0.350–4.500)

## 2019-10-17 MED ORDER — ADULT MULTIVITAMIN W/MINERALS CH
1.0000 | ORAL_TABLET | Freq: Every day | ORAL | Status: DC
  Administered 2019-10-17: 1 via ORAL
  Filled 2019-10-17: qty 1

## 2019-10-17 MED ORDER — NICOTINE 21 MG/24HR TD PT24
21.0000 mg | MEDICATED_PATCH | Freq: Every day | TRANSDERMAL | Status: DC
  Filled 2019-10-17: qty 1

## 2019-10-17 MED ORDER — CHLORDIAZEPOXIDE HCL 25 MG PO CAPS: 25.0000 mg | ORAL_CAPSULE | Freq: Two times a day (BID) | ORAL | Status: DC

## 2019-10-17 MED ORDER — CHLORDIAZEPOXIDE HCL 5 MG PO CAPS
10.0000 mg | ORAL_CAPSULE | Freq: Two times a day (BID) | ORAL | Status: DC
  Administered 2019-10-17: 10 mg via ORAL
  Filled 2019-10-17: qty 2

## 2019-10-17 MED ORDER — HYDRALAZINE HCL 20 MG/ML IJ SOLN
10.0000 mg | Freq: Four times a day (QID) | INTRAMUSCULAR | Status: DC | PRN
  Administered 2019-10-17: 10 mg via INTRAVENOUS
  Filled 2019-10-17: qty 1

## 2019-10-17 MED ORDER — NEBIVOLOL HCL 10 MG PO TABS
10.0000 mg | ORAL_TABLET | Freq: Every day | ORAL | Status: DC
  Administered 2019-10-17: 10 mg via ORAL
  Filled 2019-10-17: qty 1

## 2019-10-17 MED ORDER — SODIUM CHLORIDE 0.9 % IV SOLN
Freq: Once | INTRAVENOUS | Status: AC
  Administered 2019-10-17: 09:00:00 via INTRAVENOUS

## 2019-10-17 MED ORDER — FOLIC ACID 1 MG PO TABS
1.0000 mg | ORAL_TABLET | Freq: Every day | ORAL | Status: DC
  Administered 2019-10-17: 1 mg via ORAL
  Filled 2019-10-17: qty 1

## 2019-10-17 MED ORDER — ENOXAPARIN SODIUM 40 MG/0.4ML ~~LOC~~ SOLN
40.0000 mg | SUBCUTANEOUS | Status: DC
  Administered 2019-10-17: 40 mg via SUBCUTANEOUS
  Filled 2019-10-17: qty 0.4

## 2019-10-17 MED ORDER — LOSARTAN POTASSIUM 50 MG PO TABS
50.0000 mg | ORAL_TABLET | Freq: Every day | ORAL | Status: DC
  Administered 2019-10-17: 50 mg via ORAL
  Filled 2019-10-17: qty 1

## 2019-10-17 MED ORDER — VITAMIN B-1 100 MG PO TABS
100.0000 mg | ORAL_TABLET | Freq: Every day | ORAL | Status: DC
  Administered 2019-10-17: 100 mg via ORAL
  Filled 2019-10-17: qty 1

## 2019-10-17 NOTE — ED Notes (Signed)
Breakfast Ordered 

## 2019-10-17 NOTE — Progress Notes (Signed)
Gerrie Cui to be D/C'd Home per MD order.  Discussed with the patient and all questions fully answered.   VSS, Skin clean, dry and intact without evidence of skin break down, no evidence of skin tears noted. IV catheter discontinued intact. Site without signs and symptoms of complications. Dressing and pressure applied.   An After Visit Summary was printed and given to the patient.    D/C education completed with patient/family including follow up instructions, medication list, d/c activities limitations if indicated, with other d/c instructions as indicated by MD - patient able to verbalize understanding, all questions fully answered.    Patient instructed to return to ED, call 911, or call MD for any changes in condition.    Patient escorted via [place], and D/C home via private car.

## 2019-10-17 NOTE — Discharge Summary (Signed)
Physician Discharge Summary  Samantha Rosales KVQ:[id number] DOB: [birth year] DOA: 10/16/2019  PCP: Donita Brooks, MD  Admit date: 10/16/2019 Discharge date: 10/17/2019  Time spent: 35 minutes  Recommendations for Outpatient Follow-up:  PCP in 1 week  Discharge Diagnoses:  Principal Problem:   Alcohol withdrawal (HCC) Active Problems:   Essential hypertension   Abnormal LFTs   Hypertensive urgency   Hypokalemia   Hypomagnesemia   Alcohol abuse   Tobacco abuse   Hypothyroidism   Lactic acidosis   Confusion   Discharge Condition: Stable  Diet recommendation: Low-sodium  Filed Weights   10/17/19 0918  Weight: 68.7 kg    History of present illness:  HPI: Samantha Rosales is a 38 y.o. female with medical history significant for hypertension, GERD, alcohol abuse and tobacco abuse who presents to the emergency department due to disorientation and shakiness that started around 3 PM.  Patient was somnolent, but was easily aroused at bedside.  She was noted shaking at bedside, but she was unable to tell me why, husband at bedside also did not know why patient was having tremors.  Apparently, patient has not been taking any of her medications today, she endorsed decreased appetite for couple of days.  Patient also states that she drinks a couple of beers daily, but she could not tell me how much, last alcohol consumption was yesterday.  She denies fever, chest pain, shortness of breath, blurry vision, nausea, vomiting.  Hospital Course:   Tremors and dizziness -Due to combination of mild EtOH withdrawal, dehydration and beta-blocker withdrawal -Improved -No evidence of overt withdrawal today, admits to drinking 5 shots of liquor recently, usually drinks a few beers daily -Counseled regarding alcohol cessation, symptoms improved with hydration, brief benzodiazepine use and restarting her Bystolic which she had missed a few doses of -Discharged home in a stable condition, counseled  regarding alcohol and importance of compliance with beta-blockers   Her blood pressure remained elevated, restarted on Bystolic and losartan, advised to monitor this and follow-up with PCP closely  Rest of her chronic medical problems stable  Discharge Exam: Vitals:   10/17/19 1312 10/17/19 1500  BP: (!) 183/119 (!) 169/102  Pulse: 97 (!) 106  Resp:    Temp:    SpO2:      General: AAOx3 Cardiovascular: S1S2/RRR Respiratory: CTAB  Discharge Instructions   Discharge Instructions    Diet - low sodium heart healthy   Complete by: As directed    Increase activity slowly   Complete by: As directed      Allergies as of 10/17/2019      Reactions   Penicillins    Has patient had a PCN reaction causing immediate rash, facial/tongue/throat swelling, SOB or lightheadedness with hypotension: Unknown Has patient had a PCN reaction causing severe rash involving mucus membranes or skin necrosis: Unknown Has patient had a PCN reaction that required hospitalization: Unknown Has patient had a PCN reaction occurring within the last 10 years: No If all of the above answers are "NO", then may proceed with Cephalosporin use.      Medication List    STOP taking these medications   amoxicillin 500 MG capsule Commonly known as: AMOXIL   azithromycin 250 MG tablet Commonly known as: ZITHROMAX   HYDROcodone-acetaminophen 5-325 MG tablet Commonly known as: NORCO/VICODIN   ondansetron 4 MG disintegrating tablet Commonly known as: Zofran ODT     TAKE these medications   Biotin 1000 MCG tablet Take 1,000 mcg by mouth daily.  escitalopram 10 MG tablet Commonly known as: LEXAPRO TAKE 1 TABLET BY MOUTH EVERY DAY   fenofibrate micronized 200 MG capsule Commonly known as: LOFIBRA Take 1 capsule (200 mg total) by mouth at bedtime.   fluticasone 50 MCG/ACT nasal spray Commonly known as: FLONASE USE TWO SPRAY(S) IN EACH NOSTRIL ONCE DAILY What changed: See the new instructions.    folic acid 1 MG tablet Commonly known as: FOLVITE Take 1 mg by mouth daily.   Humira Pen 40 MG/0.8ML Pnkt Generic drug: Adalimumab Inject 1 Dose into the skin every 14 (fourteen) days.   Icosapent Ethyl 1 g Caps Commonly known as: Vascepa Take 2 capsules (2 g total) by mouth 2 (two) times daily.   lipase/protease/amylase 47829 units Cpep capsule Commonly known as: CREON Take 1 capsule (12,000 Units total) by mouth 3 (three) times daily before meals.   losartan 50 MG tablet Commonly known as: COZAAR Take 1 tablet (50 mg total) by mouth daily.   medroxyPROGESTERone 150 MG/ML injection Commonly known as: DEPO-PROVERA INJECT 1 ML INTRAMUSCULARLY IN DOCTOR'S OFFICE EVERY 3 MONTHS. What changed: See the new instructions.   nebivolol 10 MG tablet Commonly known as: Bystolic Take 1 tablet (10 mg total) by mouth daily.   pantoprazole 40 MG tablet Commonly known as: PROTONIX TAKE 1 TABLET BY MOUTH TWICE DAILY What changed: when to take this   SUMAtriptan 50 MG tablet Commonly known as: Imitrex Take 1 tablet (50 mg total) by mouth every 2 (two) hours as needed for migraine. May repeat in 2 hours if headache persists or recurs.   Vitamin D-3 25 MCG (1000 UT) Caps Take 1 capsule by mouth daily.      Allergies  Allergen Reactions  . Penicillins     Has patient had a PCN reaction causing immediate rash, facial/tongue/throat swelling, SOB or lightheadedness with hypotension: Unknown Has patient had a PCN reaction causing severe rash involving mucus membranes or skin necrosis: Unknown Has patient had a PCN reaction that required hospitalization: Unknown Has patient had a PCN reaction occurring within the last 10 years: No If all of the above answers are "NO", then may proceed with Cephalosporin use.    Follow-up Information    Donita Brooks, MD. Schedule an appointment as soon as possible for a visit in 1 week(s).   Specialty: Family Medicine Contact information: [address] [place] [address] [phone]            The results of significant diagnostics from this hospitalization (including imaging, microbiology, ancillary and laboratory) are listed below for reference.    Significant Diagnostic Studies: Dg Chest 2 View  Result Date: 10/16/2019 CLINICAL DATA:  Dizziness. EXAM: CHEST - 2 VIEW COMPARISON:  December 16, 2008 FINDINGS: The heart size and mediastinal contours are within normal limits. Both lungs are clear. The visualized skeletal structures are unremarkable. IMPRESSION: No active cardiopulmonary disease. Electronically Signed   By: Katherine Mantle M.D.   On: 10/16/2019 18:16   Ct Head Wo Contrast  Result Date: 10/16/2019 CLINICAL DATA:  Altered level of consciousness EXAM: CT HEAD WITHOUT CONTRAST TECHNIQUE: Contiguous axial images were obtained from the base of the skull through the vertex without intravenous contrast. COMPARISON:  None. FINDINGS: Brain: No evidence of acute territorial infarction, hemorrhage, hydrocephalus,extra-axial collection or mass lesion/mass effect. Normal gray-white differentiation. Ventricles are normal in size and contour. Vascular: No hyperdense vessel or unexpected calcification. Skull: The skull is intact. No fracture or focal lesion identified. Sinuses/Orbits: The  visualized paranasal sinuses and mastoid air cells are clear. The orbits and globes intact. Other: None IMPRESSION: No acute intracranial abnormality. Electronically Signed   By: Jonna Clark M.D.   On: 10/16/2019 19:45    Microbiology: Recent Results (from the past 240 hour(s))  Novel Coronavirus, NAA ([facility])     Status: None   Collection Time: 10/15/19 12:00 AM   Specimen: Nasopharyngeal(NP) swabs in vial transport medium   NASOPHARYNGE  TESTING  Result Value Ref Range Status   SARS-CoV-2, NAA Not Detected Not Detected Final    Comment: Testing was performed using the cobas(R) SARS-CoV-2 test. This nucleic acid amplification  test was developed and its performance characteristics determined by [employer]. Nucleic acid amplification tests include PCR and TMA. This test has not been FDA cleared or approved. This test has been authorized by FDA under an Emergency Use Authorization (EUA). This test is only authorized for the duration of time the declaration that circumstances exist justifying the authorization of the emergency use of in vitro diagnostic tests for detection of SARS-CoV-2 virus and/or diagnosis of COVID-19 infection under section 564(b)(1) of the Act, 21 U.S.C. 875IEP-3(I) (1), unless the authorization is terminated or revoked sooner. When diagnostic testing is negative, the possibility of a false negative result should be considered in the context of a patient's recent exposures and the presence of clinical signs and symptoms consistent with COVID-19. An individual without symptoms  of COVID-19 and who is not shedding SARS-CoV-2 virus would expect to have a negative (not detected) result in this assay.   SARS CORONAVIRUS 2 (TAT 6-24 HRS) Nasopharyngeal Nasopharyngeal Swab     Status: None   Collection Time: 10/16/19  9:28 PM   Specimen: Nasopharyngeal Swab  Result Value Ref Range Status   SARS Coronavirus 2 NEGATIVE NEGATIVE Final    Comment: (NOTE) SARS-CoV-2 target nucleic acids are NOT DETECTED. The SARS-CoV-2 RNA is generally detectable in upper and lower respiratory specimens during the acute phase of infection. Negative results do not preclude SARS-CoV-2 infection, do not rule out co-infections with other pathogens, and should not be used as the sole basis for treatment or other patient management decisions. Negative results must be combined with clinical observations, patient history, and epidemiological information. The expected result is Negative. Fact Sheet for Patients: HairSlick.no Fact Sheet for Healthcare  Providers: quierodirigir.com This test is not yet approved or cleared by the [place] FDA and  has been authorized for detection and/or diagnosis of SARS-CoV-2 by FDA under an Emergency Use Authorization (EUA). This EUA will remain  in effect (meaning this test can be used) for the duration of the COVID-19 declaration under Section 56 4(b)(1) of the Act, 21 U.S.C. section 360bbb-3(b)(1), unless the authorization is terminated or revoked sooner. Performed at [facility], [address], [place], [place] [address]      Labs: Basic Metabolic Panel: Recent Labs  Lab 10/16/19 1717 10/16/19 1925 10/17/19 0003 10/17/19 0530  NA 145  --   --  141  K 3.0*  --   --  3.9  CL 106  --   --  105  CO2 23  --   --  21*  GLUCOSE 126*  --   --  127*  BUN 6  --   --  0*  CREATININE 0.64  --  0.63 0.64  CALCIUM 9.1  --   --  9.0  MG  --  1.3*  --  1.9  PHOS  --   --   --  4.1   Liver Function Tests: Recent Labs  Lab 10/16/19 1717 10/17/19 0530  AST 72* 73*  ALT 47* 43  ALKPHOS 110 99  BILITOT 0.6 1.7*  PROT 8.2* 7.1  ALBUMIN 4.3 4.2   No results for input(s): LIPASE, AMYLASE in the last 168 hours. Recent Labs  Lab 10/16/19 1925  AMMONIA 34   CBC: Recent Labs  Lab 10/16/19 1717 10/17/19 0003 10/17/19 0530  WBC 4.4 7.7 6.1  NEUTROABS 1.7  --   --   HGB 12.2 10.9* 11.3*  HCT 34.9* 32.2* 33.1*  MCV 98.0 100.0 101.2*  PLT 224 204 253   Cardiac Enzymes: No results for input(s): CKTOTAL, CKMB, CKMBINDEX, TROPONINI in the last 168 hours. BNP: BNP (last 3 results) No results for input(s): BNP in the last 8760 hours.  ProBNP (last 3 results) No results for input(s): PROBNP in the last 8760 hours.  CBG: No results for input(s): GLUCAP in the last 168 hours.     Signed:  Zannie Cove MD.  [facility] 10/17/2019, 3:07 PM

## 2019-10-17 NOTE — Discharge Instructions (Signed)

## 2019-10-17 NOTE — Progress Notes (Addendum)
Report received from ED pt admitted to unit oriented to room.

## 2019-10-17 NOTE — H&P (Addendum)
History and Physical  Samantha Rosales OFB:[id number] DOB: [birth year] DOA: 10/16/2019  Referring physician: Julianne Rice MD PCP: [name], MD  Patient coming from: Home  Chief Complaint: Confusion  HPI: Samantha Rosales is a 38 y.o. female with medical history significant for hypertension, GERD, alcohol abuse and tobacco abuse who presents to the emergency department due to disorientation and shakiness that started around 3 PM.  Patient was somnolent, but was easily aroused at bedside.  She was noted shaking at bedside, but she was unable to tell me why, husband at bedside also did not know why patient was having tremors.  Apparently, patient has not been taking any of her medications today, she endorsed decreased appetite for couple of days.  Patient also states that she drinks a couple of beers daily, but she could not tell me how much, last alcohol consumption was yesterday.  She denies fever, chest pain, shortness of breath, blurry vision, nausea, vomiting.   ED Course:  In the emergency department, did at 179/103, she was tachycardic and tachypneic, but temperature was 56F and O2 sats 100% on room air.  Work-up in the ED showed hypokalemia and hypomagnesemia.  T4 0.54, TSH 6.058.  AST 72, ALT 47, lactic acid was elevated.  COVID-19 test was negative.  CT of head without contrast showed no acute intracranial abnormality.  Chest x-ray showed no active cardiopulmonary disease. Potassium and magnesium were replenished.  IV hydralazine and IV labetalol were given and patient was provided with IV hydration.  Hospitalist was asked to admit for further evaluation and management.  Review of Systems: Review of Systems  Constitutional: Negative for chills and fever.  HENT: Negative for ear pain and sore throat.   Eyes: Negative for pain and visual disturbance.  Respiratory: Negative for cough, chest tightness and shortness of breath.   Cardiovascular: Negative for chest pain and  palpitations.  Gastrointestinal: Negative for abdominal pain and vomiting.  Endocrine: Negative for polyphagia and polyuria.  Genitourinary: Negative for decreased urine volume, dysuria, Musculoskeletal: Negative for arthralgias and back pain.  Skin: Negative for color change and rash.  Allergic/Immunologic: Negative for immunocompromised state.  Neurological: Positive for  dizziness and tremors,  negative for syncope, speech difficulty  and headaches.  Hematological: Does not bruise/bleed easily.  All other systems reviewed and are negative   Past Medical History:  Diagnosis Date  . Abnormal Pap smear   . Alcohol use   . Allergic rhinitis   . BV (bacterial vaginosis)   . GERD (gastroesophageal reflux disease)   . High cholesterol   . HSV-2 (herpes simplex virus 2) infection   . Hypertension   . Rheumatoid arthritis ([place])   . Seizures ([place])    dx as child with epilepsy but no seizures since 89 and on no meds  . Vaginal Pap smear, abnormal    Past Surgical History:  Procedure Laterality Date  . CERVICAL CONIZATION W/BX  12/20/2012   Procedure: CONIZATION CERVIX WITH BIOPSY;  Surgeon: Florian Buff, MD;  Location: [facility];  Service: Gynecology;  Laterality: N/A;  . HOLMIUM LASER APPLICATION  7/82/4235   Procedure: HOLMIUM LASER APPLICATION;  Surgeon: Florian Buff, MD;  Location: [facility];  Service: Gynecology;  Laterality: N/A;    Social History:  reports that she has been smoking cigarettes. She has a 7.50 pack-year smoking history. She has never used smokeless tobacco. She reports current alcohol use. She reports that she does not use drugs.   Allergies  Allergen  Reactions  . Penicillins     Has patient had a PCN reaction causing immediate rash, facial/tongue/throat swelling, SOB or lightheadedness with hypotension: Unknown Has patient had a PCN reaction causing severe rash involving mucus membranes or skin necrosis: Unknown Has patient had a PCN reaction that required  hospitalization: Unknown Has patient had a PCN reaction occurring within the last 10 years: No If all of the above answers are "NO", then may proceed with Cephalosporin use.     Family History  Problem Relation Age of Onset  . Hypertension Mother   . Hypertension Father   . Pancreatic cancer Paternal Uncle   . Colon cancer Neg Hx   . Esophageal cancer Neg Hx   . Inflammatory bowel disease Neg Hx   . Liver disease Neg Hx   . Rectal cancer Neg Hx   . Stomach cancer Neg Hx       Prior to Admission medications   Medication Sig Start Date End Date Taking? Authorizing Provider  amoxicillin (AMOXIL) 500 MG capsule Take 500 mg by mouth 3 (three) times daily. 10/08/19  Yes [provider]  azithromycin (ZITHROMAX) 250 MG tablet Take 250-500 mg by mouth See admin instructions. Take 500mg  on day one and then 250mg  on days 2 through 4. 10/08/19  Yes [provider]  Biotin 1000 MCG tablet Take 1,000 mcg by mouth daily.   Yes [provider]  Cholecalciferol (VITAMIN D-3) 1000 UNITS CAPS Take 1 capsule by mouth daily.   Yes [provider]  escitalopram (LEXAPRO) 10 MG tablet TAKE 1 TABLET BY MOUTH EVERY DAY Patient taking differently: Take 10 mg by mouth daily.  09/18/19  Yes 13/4/20, MD  fenofibrate micronized (LOFIBRA) 200 MG capsule Take 1 capsule (200 mg total) by mouth at bedtime. 06/10/19  Yes Donita Brooks, PA-C  fluticasone (FLONASE) 50 MCG/ACT nasal spray USE TWO SPRAY(S) IN EACH NOSTRIL ONCE DAILY Patient taking differently: Place 2 sprays into both nostrils daily.  06/21/17  Yes Danelle Berry, MD  folic acid (FOLVITE) 1 MG tablet Take 1 mg by mouth daily.   Yes [provider]  HUMIRA PEN 40 MG/0.8ML PNKT Inject 1 Dose into the skin every 14 (fourteen) days.    Yes [provider]  Icosapent Ethyl (VASCEPA) 1 g CAPS Take 2 capsules (2 g total) by mouth 2 (two) times daily. 09/05/18  Yes Donita Brooks, MD   lipase/protease/amylase (CREON) 12000 units CPEP capsule Take 1 capsule (12,000 Units total) by mouth 3 (three) times daily before meals. 06/11/19  Yes Donita Brooks, PA-C  medroxyPROGESTERone (DEPO-PROVERA) 150 MG/ML injection INJECT 1 ML INTRAMUSCULARLY IN DOCTOR'S OFFICE EVERY 3 MONTHS. Patient taking differently: INJECT 1 ML INTRAMUSCULARLY IN DOCTOR&#39;S OFFICE EVERY 3 MONTHS. 09/18/19  Yes Danelle Berry A, NP  nebivolol (BYSTOLIC) 10 MG tablet Take 1 tablet (10 mg total) by mouth daily. 03/27/18  Yes Cyril Mourning, MD  pantoprazole (PROTONIX) 40 MG tablet TAKE 1 TABLET BY MOUTH TWICE DAILY Patient taking differently: Take 40 mg by mouth daily.  12/16/18  Yes Mansouraty, Donita Brooks., MD  SUMAtriptan (IMITREX) 50 MG tablet Take 1 tablet (50 mg total) by mouth every 2 (two) hours as needed for migraine. May repeat in 2 hours if headache persists or recurs. 10/25/18  Yes Netty Starring, MD  HYDROcodone-acetaminophen (NORCO/VICODIN) 5-325 MG tablet Take 1-2 tablets by mouth every 4 (four) hours as needed for severe pain. Patient not taking: Reported on 09/26/2019 06/04/19   Upstill,  Melvenia BeamShari, PA-C  losartan (COZAAR) 50 MG tablet Take 1 tablet (50 mg total) by mouth daily. Patient not taking: Reported on 10/16/2019 12/24/18   Salley Scarleturham, Kawanta F, MD  ondansetron (ZOFRAN ODT) 4 MG disintegrating tablet Take 1 tablet (4 mg total) by mouth every 8 (eight) hours as needed for nausea or vomiting. Patient not taking: Reported on 06/10/2019 06/04/19   Elpidio AnisUpstill, Shari, PA-C    Physical Exam: BP (!) 164/92   Pulse 88   Temp 99 F (37.2 C) (Rectal)   Resp 19   SpO2 100%   . General: 38 y.o. year-old female well developed well nourished in no acute distress.  Alert and oriented x3. Marland Kitchen. HEENT: Normocephalic atraumatic, PERRLA . NECK: Supple, trachea medial . Cardiovascular: Tachycardia.  Regular rate and rhythm with no rubs or gallops.  No thyromegaly or JVD noted.  No lower extremity edema. 2/4 pulses in all  4 extremities. Marland Kitchen. Respiratory: Tachypneic.  Clear to auscultation with no wheezes or rales. Good inspiratory effort. . Abdomen: Soft nontender nondistended with normal bowel sounds x4 quadrants. . Muskuloskeletal: No cyanosis, clubbing or edema noted bilaterally . Neuro: Tremors bilaterally in upper extremities.  CN II-XII intact, strength, sensation, reflexes . Skin: No ulcerative lesions noted or rashes . Psychiatry: Judgement and insight appear normal. Mood is appropriate for condition and setting          Labs on Admission:  Basic Metabolic Panel: Recent Labs  Lab 10/16/19 1717 10/16/19 1925 10/17/19 0003  NA 145  --   --   K 3.0*  --   --   CL 106  --   --   CO2 23  --   --   GLUCOSE 126*  --   --   BUN 6  --   --   CREATININE 0.64  --  0.63  CALCIUM 9.1  --   --   MG  --  1.3*  --    Liver Function Tests: Recent Labs  Lab 10/16/19 1717  AST 72*  ALT 47*  ALKPHOS 110  BILITOT 0.6  PROT 8.2*  ALBUMIN 4.3   No results for input(s): LIPASE, AMYLASE in the last 168 hours. Recent Labs  Lab 10/16/19 1925  AMMONIA 34   CBC: Recent Labs  Lab 10/16/19 1717 10/17/19 0003  WBC 4.4 7.7  NEUTROABS 1.7  --   HGB 12.2 10.9*  HCT 34.9* 32.2*  MCV 98.0 100.0  PLT 224 204   Cardiac Enzymes: No results for input(s): CKTOTAL, CKMB, CKMBINDEX, TROPONINI in the last 168 hours.  BNP (last 3 results) No results for input(s): BNP in the last 8760 hours.  ProBNP (last 3 results) No results for input(s): PROBNP in the last 8760 hours.  CBG: No results for input(s): GLUCAP in the last 168 hours.  Radiological Exams on Admission: Dg Chest 2 View  Result Date: 10/16/2019 CLINICAL DATA:  Dizziness. EXAM: CHEST - 2 VIEW COMPARISON:  December 16, 2008 FINDINGS: The heart size and mediastinal contours are within normal limits. Both lungs are clear. The visualized skeletal structures are unremarkable. IMPRESSION: No active cardiopulmonary disease. Electronically Signed   By:  Katherine Mantlehristopher  Green M.D.   On: 10/16/2019 18:16   Ct Head Wo Contrast  Result Date: 10/16/2019 CLINICAL DATA:  Altered level of consciousness EXAM: CT HEAD WITHOUT CONTRAST TECHNIQUE: Contiguous axial images were obtained from the base of the skull through the vertex without intravenous contrast. COMPARISON:  None. FINDINGS: Brain: No evidence of acute territorial infarction,  hemorrhage, hydrocephalus,extra-axial collection or mass lesion/mass effect. Normal gray-white differentiation. Ventricles are normal in size and contour. Vascular: No hyperdense vessel or unexpected calcification. Skull: The skull is intact. No fracture or focal lesion identified. Sinuses/Orbits: The visualized paranasal sinuses and mastoid air cells are clear. The orbits and globes intact. Other: None IMPRESSION: No acute intracranial abnormality. Electronically Signed   By: Jonna Clark M.D.   On: 10/16/2019 19:45    EKG: I independently viewed the EKG done and my findings are as followed: Normal sinus rhythm at 75 bpm  Assessment/Plan Present on Admission: . Hypertensive urgency . Essential hypertension . Abnormal LFTs  Principal Problem:   Essential hypertension Active Problems:   Abnormal LFTs   Hypertensive urgency   Hypokalemia   Hypomagnesemia   Alcohol abuse   Tobacco abuse   Hypothyroidism   Alcohol withdrawal (HCC)   Lactic acidosis    Alcohol withdrawal/alcohol abuse Patient presents with asterixis and tremors, she has a history of daily alcohol consumption with last alcohol being yesterday.  Ethanol level was negative. Patient was started on CIWA protocol, continue Librium, thiamine, folic acid and multivitamin.  Lactic acidosis Lactic acid 3.4 > 4.2 Patient also presents with gap and folic acidosis, which may be due to above Continue IV hydration Continue to monitor lactic acid levels.  Hypertensive urgency-resolved Hypertension Continue home meds when med rec is updated  Questionable  hypothyroidism TSH 6.058; T4 0.54; patient has no prior history of hypothyroidism TSH will be repeated prior to starting Synthroid  Elevated liver enzymes AST 72, ALT 47 Continue to monitor liver panel  Electrolyte abnormalities (hypokalemia, hypomagnesemia) These were replenished, continue to monitor potassium and magnesium levels.  Tobacco abuse Patient will be counseled on tobacco abuse cessation when more stable.   DVT prophylaxis: Lovenox  Code Status: Full code  Family Communication: Husband at bedside (all questions were answered to their satisfaction  Disposition Plan: Plan to discharge home once clinically stable  Consults called: None  Admission status: Observation    Frankey Shown MD [facility] Pager [phone]  If 7PM-7AM, please contact night-coverage [email or web address] Password [email or web address]  10/17/2019, 4:40 AM

## 2019-10-18 LAB — COMPREHENSIVE METABOLIC PANEL
ALT: 43 U/L (ref 0–44)
AST: 73 U/L — ABNORMAL HIGH (ref 15–41)
Albumin: 4.2 g/dL (ref 3.5–5.0)
Alkaline Phosphatase: 99 U/L (ref 38–126)
Anion gap: 15 (ref 5–15)
BUN: <5 mg/dL — ABNORMAL LOW (ref 6–20)
CO2: 21 mmol/L — ABNORMAL LOW (ref 22–32)
Calcium: 9 mg/dL (ref 8.9–10.3)
Chloride: 105 mmol/L (ref 98–111)
Creatinine, Ser: 0.64 mg/dL (ref 0.44–1.00)
GFR calc Af Amer: >60 mL/min (ref >60)
GFR calc non Af Amer: >60 mL/min (ref >60)
Glucose, Bld: 127 mg/dL — ABNORMAL HIGH (ref 70–99)
Potassium: 3.9 mmol/L (ref 3.5–5.1)
Sodium: 141 mmol/L (ref 135–145)
Total Bilirubin: 1.7 mg/dL — ABNORMAL HIGH (ref 0.3–1.2)
Total Protein: 7.1 g/dL (ref 6.5–8.1)

## 2019-11-14 ENCOUNTER — Encounter: Payer: Self-pay | Admitting: Family Medicine

## 2019-11-14 ENCOUNTER — Ambulatory Visit (INDEPENDENT_AMBULATORY_CARE_PROVIDER_SITE_OTHER): Payer: BC Managed Care – PPO | Admitting: Family Medicine

## 2019-11-14 ENCOUNTER — Other Ambulatory Visit: Payer: Self-pay

## 2019-11-14 VITALS — BP 114/72 | HR 94 | Temp 96.8°F | Resp 14 | Ht 68.0 in | Wt 158.0 lb

## 2019-11-14 DIAGNOSIS — F10230 Alcohol dependence with withdrawal, uncomplicated: Secondary | ICD-10-CM | POA: Diagnosis not present

## 2019-11-14 DIAGNOSIS — F10930 Alcohol use, unspecified with withdrawal, uncomplicated: Secondary | ICD-10-CM

## 2019-11-14 DIAGNOSIS — E038 Other specified hypothyroidism: Secondary | ICD-10-CM

## 2019-11-14 DIAGNOSIS — K859 Acute pancreatitis without necrosis or infection, unspecified: Secondary | ICD-10-CM | POA: Insufficient documentation

## 2019-11-14 DIAGNOSIS — E039 Hypothyroidism, unspecified: Secondary | ICD-10-CM | POA: Diagnosis not present

## 2019-11-14 MED ORDER — TIZANIDINE HCL 4 MG PO TABS: 4.0000 mg | ORAL_TABLET | Freq: Four times a day (QID) | ORAL | 0 refills | Status: DC | PRN

## 2019-11-14 NOTE — Progress Notes (Signed)
Subjective:    Patient ID: Samantha Rosales, female    DOB: 05/23/1981, 38 y.o.   MRN: [id number]  HPI  06/2018 Patient has a history of mild elevations in her liver function test as evidenced on her blood work in October of last year.  Recently was found to have significant elevations in her liver function test with an AST greater than 1000 and an ALT greater than 800.  There was also mild elevations in her alkaline phosphatase, her bilirubin was greater than 3 and she had a mildly elevated lipase.  She was referred to the emergency room where right upper quadrant ultrasound was negative for any evidence of bile duct obstruction.  Viral hepatitis serologies were negative.  She was instructed to follow-up with her PCP and is here today for follow-up.  I had already recommended that the patient discontinue fenofibrate immediately.  Of note she is also taking methotrexate for rheumatoid arthritis.  She has been on this medication for years.  She denies any excessive alcohol use.  She states that she may drink a glass of wine occasionally.  She denies any other hepatotoxic medication use such as Tylenol.  At that time, my plan was: Given the severity of her liver function test elevation, I am concerned about medication induced hepatotoxicity from methotrexate versus autoimmune hepatitis.  Repeat CMP today.  If liver function test remains significantly elevated, I will consult GI.  I have asked the patient to discontinue methotrexate altogether and I will notify her rheumatologist, Dr. Dierdre ForthBeekman of the significant elevation in her liver function test.  I would like to repeat liver function test in 2 weeks after discontinuing methotrexate.  I will also begin work-up for autoimmune hepatitis but checking an ANA, and antimitochondrial antibody, and anti-liver kidney antibody.  Spent approximately 25 minutes with the patient discussing her lab test, diagnosis and work up.    11/14/19 Elevations in liver function  test were thought to be due to methotrexate and the medication was discontinued.  However in December of last year, the patient was seen in the emergency room for abdominal pain and was found to have acute pancreatitis.  Patient states that this was thought to be due to alcohol.  Patient was recently admitted to the hospital with delirium.  She was having tremor.  She was confused and lethargic.  Her blood alcohol level was undetectable.  Urine drug screen was negative.  Head CT was normal.  Lab work was significant for mild anemia, mild elevations in liver function test.  Diagnosis was possible alcohol withdrawal coupled with hypertensive urgency.  Patient is here today for follow-up.  She states that the day before she was hospitalized, she had had "5 shots".  She had not drank anything the day that she was hospitalized.  She has no recollection of what happened.  Her daughter found her shaking and confused.  EMS was called.  Since discharge from the hospital, she states that she is drinking occasionally.  She states maybe she is drinking 3 shots of alcohol a day.  She states that she has lost her insurance and she has not been taking Humira for her RA.  As result she has terrible back pain and is having muscle spasms in her back for which she is requesting pain medication.  Blood pressure today is much better at 114/72.  Lab work in the hospital was concerning for subclinical hypothyroidism Past Medical History:  Diagnosis Date  . Abnormal Pap smear   .  Alcohol use   . Allergic rhinitis   . BV (bacterial vaginosis)   . GERD (gastroesophageal reflux disease)   . High cholesterol   . HSV-2 (herpes simplex virus 2) infection   . Hypertension   . Rheumatoid arthritis (HCC)   . Seizures (HCC)    dx as child with epilepsy but no seizures since 89 and on no meds  . Vaginal Pap smear, abnormal    Past Surgical History:  Procedure Laterality Date  . CERVICAL CONIZATION W/BX  12/20/2012   Procedure:  CONIZATION CERVIX WITH BIOPSY;  Surgeon: Lazaro Arms, MD;  Location: [facility];  Service: Gynecology;  Laterality: N/A;  . HOLMIUM LASER APPLICATION  12/20/2012   Procedure: HOLMIUM LASER APPLICATION;  Surgeon: Lazaro Arms, MD;  Location: [facility];  Service: Gynecology;  Laterality: N/A;   Current Outpatient Medications on File Prior to Visit  Medication Sig Dispense Refill  . Biotin 1000 MCG tablet Take 1,000 mcg by mouth daily.    . Cholecalciferol (VITAMIN D-3) 1000 UNITS CAPS Take 1 capsule by mouth daily.    Marland Kitchen escitalopram (LEXAPRO) 10 MG tablet TAKE 1 TABLET BY MOUTH EVERY DAY 90 tablet 3  . fenofibrate micronized (LOFIBRA) 200 MG capsule Take 1 capsule (200 mg total) by mouth at bedtime. 90 capsule 3  . fluticasone (FLONASE) 50 MCG/ACT nasal spray USE TWO SPRAY(S) IN EACH NOSTRIL ONCE DAILY 16 g 11  . folic acid (FOLVITE) 1 MG tablet Take 1 mg by mouth daily.    Marland Kitchen HUMIRA PEN 40 MG/0.8ML PNKT Inject 1 Dose into the skin every 14 (fourteen) days.   0  . Icosapent Ethyl (VASCEPA) 1 g CAPS Take 2 capsules (2 g total) by mouth 2 (two) times daily. 120 capsule 11  . lipase/protease/amylase (CREON) 12000 units CPEP capsule Take 1 capsule (12,000 Units total) by mouth 3 (three) times daily before meals. 270 capsule 1  . losartan (COZAAR) 50 MG tablet Take 1 tablet (50 mg total) by mouth daily. 90 tablet 3  . medroxyPROGESTERone (DEPO-PROVERA) 150 MG/ML injection INJECT 1 ML INTRAMUSCULARLY IN DOCTOR'S OFFICE EVERY 3 MONTHS. 1 mL 0  . nebivolol (BYSTOLIC) 10 MG tablet Take 1 tablet (10 mg total) by mouth daily. 90 tablet 3  . pantoprazole (PROTONIX) 40 MG tablet TAKE 1 TABLET BY MOUTH TWICE DAILY 180 tablet 0  . SUMAtriptan (IMITREX) 50 MG tablet Take 1 tablet (50 mg total) by mouth every 2 (two) hours as needed for migraine. May repeat in 2 hours if headache persists or recurs. 10 tablet 3   No current facility-administered medications on file prior to visit.   Allergies  Allergen Reactions  .  Penicillins     Has patient had a PCN reaction causing immediate rash, facial/tongue/throat swelling, SOB or lightheadedness with hypotension: Unknown Has patient had a PCN reaction causing severe rash involving mucus membranes or skin necrosis: Unknown Has patient had a PCN reaction that required hospitalization: Unknown Has patient had a PCN reaction occurring within the last 10 years: No If all of the above answers are "NO", then may proceed with Cephalosporin use.    Social History   Socioeconomic History  . Marital status: Single    Spouse name: Not on file  . Number of children: 2  . Years of education: Not on file  . Highest education level: Not on file  Occupational History  . 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 Smoking  status: Current Every Day Smoker    Packs/day: 0.50    Years: 15.00    Pack years: 7.50    Types: Cigarettes  . Smokeless tobacco: Never Used  Substance and Sexual Activity  . Alcohol use: Yes    Comment: on the weekends drinks with friends  . Drug use: No  . Sexual activity: Yes    Birth control/protection: Injection    Comment: Depo Provera every 3 months  Other Topics Concern  . Not on file  Social History Narrative  . Not on file   Social Determinants of Health   Financial Resource Strain:   . Difficulty of Paying Living Expenses: Not on file  Food Insecurity:   . Worried About Charity fundraiser in the Last Year: Not on file  . Ran Out of Food in the Last Year: Not on file  Transportation Needs:   . Lack of Transportation (Medical): Not on file  . Lack of Transportation (Non-Medical): Not on file  Physical Activity:   . Days of Exercise per Week: Not on file  . Minutes of Exercise per Session: Not on file  Stress:   . Feeling of Stress : Not on file  Social Connections:   . Frequency of Communication with Friends and Family: Not on file  . Frequency of Social Gatherings with  Friends and Family: Not on file  . Attends Religious Services: Not on file  . Active Member of Clubs or Organizations: Not on file  . Attends Archivist Meetings: Not on file  . Marital Status: Not on file  Intimate Partner Violence:   . Fear of Current or Ex-Partner: Not on file  . Emotionally Abused: Not on file  . Physically Abused: Not on file  . Sexually Abused: Not on file   Family History  Problem Relation Age of Onset  . Hypertension Mother   . Hypertension Father   . Pancreatic cancer Paternal Uncle   . Colon cancer Neg Hx   . Esophageal cancer Neg Hx   . Inflammatory bowel disease Neg Hx   . Liver disease Neg Hx   . Rectal cancer Neg Hx   . Stomach cancer Neg Hx       Review of Systems  All other systems reviewed and are negative.      Objective:   Physical Exam  Constitutional: She is oriented to person, place, and time. She appears well-developed and well-nourished. No distress.  HENT:  Head: Normocephalic and atraumatic.  Right Ear: External ear normal.  Left Ear: External ear normal.  Nose: Nose normal.  Mouth/Throat: Oropharynx is clear and moist. No oropharyngeal exudate.  Eyes: Pupils are equal, round, and reactive to light. Conjunctivae are normal. Right eye exhibits no discharge. Left eye exhibits no discharge. No scleral icterus.  Neck: No JVD present. No tracheal deviation present. No thyromegaly present.  Cardiovascular: Normal rate, regular rhythm and intact distal pulses. Exam reveals no gallop and no friction rub.  No murmur heard. Pulmonary/Chest: Effort normal and breath sounds normal. No stridor. No respiratory distress. She has no wheezes. She has no rales. She exhibits no tenderness.  Abdominal: Soft. Bowel sounds are normal. She exhibits no distension and no mass. There is no abdominal tenderness. There is no rebound and no guarding.  Musculoskeletal:        General: No tenderness, deformity or edema.     Cervical back: Neck  supple.     Lumbar back: Pain present. Decreased range of  motion.  Lymphadenopathy:    She has no cervical adenopathy.  Neurological: She is alert and oriented to person, place, and time. She has normal reflexes. No cranial nerve deficit. She exhibits normal muscle tone. Coordination normal.  Skin: Skin is warm and dry. No rash noted. She is not diaphoretic. No erythema. No pallor.  Psychiatric: She has a normal mood and affect. Her behavior is normal. Judgment and thought content normal.  Vitals reviewed.         Assessment & Plan:  Subclinical hypothyroidism - Plan: CBC with Differential, Culture, routine-abscess, TSH, T4, Free  Alcohol withdrawal syndrome without complication (HCC) - Plan: Alcohol, Ethyl, (U) QL w/Conf  Reviewing her past records, I now believe that the elevated liver function test may have been alcohol induced hepatitis coupled with methotrexate.  She then had an episode of pancreatitis that she admits was due to alcohol.  I believe that most recently she was experiencing possible delirium tremens or possible alcohol withdrawal syndrome.  I question the veracity of her reported alcohol use.  I will check a level today.  I have strongly suggested an outpatient treatment program such as fellowship I will or alcoholics anonymous.  I believe she needs physician monitored alcohol withdrawal to prevent delirium tremens and therefore I have recommended fellowship I will.  Patient is somewhat in denial and is precontemplative.  I will recheck her lab work today and also monitor her subclinical hypothyroidism.  I will give the patient Zanaflex 4 mg tablets every 6 hours as needed for muscle spasms in her back.  I strongly cautioned the patient not to mix this with any mood altering substances or alcohol due to the sedative effect.

## 2019-11-19 LAB — TEST AUTHORIZATION

## 2019-11-19 LAB — CBC WITH DIFFERENTIAL/PLATELET
Absolute Monocytes: 551 cells/uL (ref 200–950)
Basophils Absolute: 70 cells/uL (ref 0–200)
Basophils Relative: 1.3 %
Eosinophils Absolute: 49 cells/uL (ref 15–500)
Eosinophils Relative: 0.9 %
HCT: 30.2 % — ABNORMAL LOW (ref 35.0–45.0)
Hemoglobin: 10.3 g/dL — ABNORMAL LOW (ref 11.7–15.5)
Lymphs Abs: 3105 cells/uL (ref 850–3900)
MCH: 35.3 pg — ABNORMAL HIGH (ref 27.0–33.0)
MCHC: 34.1 g/dL (ref 32.0–36.0)
MCV: 103.4 fL — ABNORMAL HIGH (ref 80.0–100.0)
MPV: 10.9 fL (ref 7.5–12.5)
Monocytes Relative: 10.2 %
Neutro Abs: 1625 cells/uL (ref 1500–7800)
Neutrophils Relative %: 30.1 %
Platelets: 341 10*3/uL (ref 140–400)
RBC: 2.92 10*6/uL — ABNORMAL LOW (ref 3.80–5.10)
RDW: 13.8 % (ref 11.0–15.0)
Total Lymphocyte: 57.5 %
WBC: 5.4 10*3/uL (ref 3.8–10.8)

## 2019-11-19 LAB — TSH: TSH: 1.38 mIU/L

## 2019-11-19 LAB — T4, FREE: Free T4: 1 ng/dL (ref 0.8–1.8)

## 2019-11-19 LAB — B12 AND FOLATE PANEL
Folate: >24 ng/mL
Vitamin B-12: 412 pg/mL (ref 200–1100)

## 2019-11-21 ENCOUNTER — Other Ambulatory Visit: Payer: Self-pay

## 2019-11-21 ENCOUNTER — Ambulatory Visit: Payer: BC Managed Care – PPO | Attending: Internal Medicine

## 2019-11-21 DIAGNOSIS — Z20822 Contact with and (suspected) exposure to covid-19: Secondary | ICD-10-CM

## 2019-11-22 LAB — NOVEL CORONAVIRUS, NAA: SARS-CoV-2, NAA: NOT DETECTED

## 2019-12-02 ENCOUNTER — Ambulatory Visit (INDEPENDENT_AMBULATORY_CARE_PROVIDER_SITE_OTHER): Payer: BC Managed Care – PPO | Admitting: Family Medicine

## 2019-12-02 ENCOUNTER — Other Ambulatory Visit: Payer: Self-pay

## 2019-12-02 DIAGNOSIS — K921 Melena: Secondary | ICD-10-CM | POA: Diagnosis not present

## 2019-12-02 MED ORDER — DIPHENOXYLATE-ATROPINE 2.5-0.025 MG PO TABS: 2.0000 | ORAL_TABLET | Freq: Four times a day (QID) | ORAL | 0 refills | Status: DC | PRN

## 2019-12-02 NOTE — Progress Notes (Signed)
Subjective:    Patient ID: Samantha Rosales, female    DOB: 1981/04/29, 38 y.o.   MRN: [id number]  HPI Patient is being seen today as a telephone visit.  Phone call began at 1105.  Phone call concluded 1118.  She consents to be seen by telephone.  She states for the last 2 days she has had diarrhea.  She reports going to the bathroom 6 or more times a day.  However at times she strains and she will see blood in the toilet after she has diarrhea.  She reports that a small amount of blood.  She will also see blood on the toilet tissue after she wiped.  She denies a gross hemorrhage of blood.  She denies any abdominal pain.  She denies any fever.  She denies any nausea or vomiting.  She was recently screened for Covid and therefore was asked not to come to the office today for evaluation as her family member has tested positive.  She denies any dizziness or syncope or near syncope.  She denies any chest pain or shortness of breath or dyspnea on exertion.  She denies any lightheadedness.  She states that she believes the bleeding is coming from her wiping and straining hard to have a bowel movement despite having the diarrhea. Past Medical History:  Diagnosis Date  . Abnormal Pap smear   . Alcohol use   . Allergic rhinitis   . BV (bacterial vaginosis)   . GERD (gastroesophageal reflux disease)   . Hepatitis    LFT's >1000 (2019) due to methotrexate vs alcohol  . High cholesterol   . HSV-2 (herpes simplex virus 2) infection   . Hypertension   . Pancreatitis    12/19 (thought to be due to alcohol)  . Rheumatoid arthritis (HCC)   . Seizures (HCC)    dx as child with epilepsy but no seizures since 89 and on no meds  . Vaginal Pap smear, abnormal    Past Surgical History:  Procedure Laterality Date  . CERVICAL CONIZATION W/BX  12/20/2012   Procedure: CONIZATION CERVIX WITH BIOPSY;  Surgeon: Lazaro Arms, MD;  Location: [facility];  Service: Gynecology;  Laterality: N/A;  . HOLMIUM LASER APPLICATION   12/20/2012   Procedure: HOLMIUM LASER APPLICATION;  Surgeon: Lazaro Arms, MD;  Location: [facility];  Service: Gynecology;  Laterality: N/A;   Current Outpatient Medications on File Prior to Visit  Medication Sig Dispense Refill  . Biotin 1000 MCG tablet Take 1,000 mcg by mouth daily.    . Cholecalciferol (VITAMIN D-3) 1000 UNITS CAPS Take 1 capsule by mouth daily.    Marland Kitchen escitalopram (LEXAPRO) 10 MG tablet TAKE 1 TABLET BY MOUTH EVERY DAY 90 tablet 3  . fenofibrate micronized (LOFIBRA) 200 MG capsule Take 1 capsule (200 mg total) by mouth at bedtime. 90 capsule 3  . fluticasone (FLONASE) 50 MCG/ACT nasal spray USE TWO SPRAY(S) IN EACH NOSTRIL ONCE DAILY 16 g 11  . folic acid (FOLVITE) 1 MG tablet Take 1 mg by mouth daily.    Marland Kitchen HUMIRA PEN 40 MG/0.8ML PNKT Inject 1 Dose into the skin every 14 (fourteen) days.   0  . Icosapent Ethyl (VASCEPA) 1 g CAPS Take 2 capsules (2 g total) by mouth 2 (two) times daily. 120 capsule 11  . lipase/protease/amylase (CREON) 12000 units CPEP capsule Take 1 capsule (12,000 Units total) by mouth 3 (three) times daily before meals. 270 capsule 1  . losartan (COZAAR) 50 MG tablet  Take 1 tablet (50 mg total) by mouth daily. 90 tablet 3  . medroxyPROGESTERone (DEPO-PROVERA) 150 MG/ML injection INJECT 1 ML INTRAMUSCULARLY IN DOCTOR'S OFFICE EVERY 3 MONTHS. 1 mL 0  . nebivolol (BYSTOLIC) 10 MG tablet Take 1 tablet (10 mg total) by mouth daily. 90 tablet 3  . pantoprazole (PROTONIX) 40 MG tablet TAKE 1 TABLET BY MOUTH TWICE DAILY 180 tablet 0  . SUMAtriptan (IMITREX) 50 MG tablet Take 1 tablet (50 mg total) by mouth every 2 (two) hours as needed for migraine. May repeat in 2 hours if headache persists or recurs. 10 tablet 3  . tiZANidine (ZANAFLEX) 4 MG tablet Take 1 tablet (4 mg total) by mouth every 6 (six) hours as needed for muscle spasms. 30 tablet 0   No current facility-administered medications on file prior to visit.   Allergies  Allergen Reactions  . Penicillins      Has patient had a PCN reaction causing immediate rash, facial/tongue/throat swelling, SOB or lightheadedness with hypotension: Unknown Has patient had a PCN reaction causing severe rash involving mucus membranes or skin necrosis: Unknown Has patient had a PCN reaction that required hospitalization: Unknown Has patient had a PCN reaction occurring within the last 10 years: No If all of the above answers are "NO", then may proceed with Cephalosporin use.    Social History   Socioeconomic History  . Marital status: Single    Spouse name: Not on file  . Number of children: 2  . Years of education: Not on file  . Highest education level: Not on file  Occupational History  . 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 Smoking status: Current Every Day Smoker    Packs/day: 0.50    Years: 15.00    Pack years: 7.50    Types: Cigarettes  . Smokeless tobacco: Never Used  Substance and Sexual Activity  . Alcohol use: Yes    Comment: on the weekends drinks with friends  . Drug use: No  . Sexual activity: Yes    Birth control/protection: Injection    Comment: Depo Provera every 3 months  Other Topics Concern  . Not on file  Social History Narrative  . Not on file   Social Determinants of Health   Financial Resource Strain:   . Difficulty of Paying Living Expenses: Not on file  Food Insecurity:   . Worried About Programme researcher, broadcasting/film/video in the Last Year: Not on file  . Ran Out of Food in the Last Year: Not on file  Transportation Needs:   . Lack of Transportation (Medical): Not on file  . Lack of Transportation (Non-Medical): Not on file  Physical Activity:   . Days of Exercise per Week: Not on file  . Minutes of Exercise per Session: Not on file  Stress:   . Feeling of Stress : Not on file  Social Connections:   . Frequency of Communication with Friends and Family: Not on file  . Frequency of Social Gatherings with Friends and Family:  Not on file  . Attends Religious Services: Not on file  . Active Member of Clubs or Organizations: Not on file  . Attends Banker Meetings: Not on file  . Marital Status: Not on file  Intimate Partner Violence:   . Fear of Current or Ex-Partner: Not on file  . Emotionally Abused: Not on file  . Physically Abused: Not on file  . Sexually Abused: Not on  file      Review of Systems  All other systems reviewed and are negative.      Objective:   Physical Exam        Assessment & Plan:  Hematochezia  Patient believes the blood that she is seen in the toilet is due to the copious diarrhea that she has had and the frequent wiping and irritation.  Therefore we will focus on trying to stop the diarrhea with Lomotil 2 tablets every 6 hours as needed.  I encouraged the patient to take 325 mg ferrous sulfate tablets twice daily given her anemia.  If she develops any chest pain shortness of breath or lightheadedness she needs to be seen immediately to check for severe worsening anemia.  If she sees worsening hematochezia she needs to go to the hospital immediately.  If this persists, she needs to see GI for a colonoscopy.  Patient understands.  She will call me back if the bleeding does not stop.

## 2019-12-03 ENCOUNTER — Telehealth: Payer: Self-pay

## 2019-12-03 ENCOUNTER — Other Ambulatory Visit: Payer: Self-pay

## 2019-12-03 MED ORDER — PANTOPRAZOLE SODIUM 40 MG PO TBEC: 40.0000 mg | DELAYED_RELEASE_TABLET | Freq: Two times a day (BID) | ORAL | 0 refills | Status: DC

## 2019-12-03 MED ORDER — DIPHENOXYLATE-ATROPINE 2.5-0.025 MG PO TABS: 2.0000 | ORAL_TABLET | Freq: Four times a day (QID) | ORAL | 0 refills | Status: DC | PRN

## 2019-12-03 NOTE — Telephone Encounter (Signed)
Received fax from [address]. Requesting new rx for Pantoprazole. Rx will be sent for 30 day supply with no refills. Pt will need to schedule office visit for further refills.

## 2019-12-18 ENCOUNTER — Other Ambulatory Visit: Payer: Self-pay | Admitting: Adult Health

## 2019-12-19 ENCOUNTER — Other Ambulatory Visit: Payer: Self-pay

## 2019-12-19 ENCOUNTER — Ambulatory Visit (INDEPENDENT_AMBULATORY_CARE_PROVIDER_SITE_OTHER): Payer: BC Managed Care – PPO | Admitting: *Deleted

## 2019-12-19 DIAGNOSIS — Z3042 Encounter for surveillance of injectable contraceptive: Secondary | ICD-10-CM

## 2019-12-19 MED ORDER — MEDROXYPROGESTERONE ACETATE 150 MG/ML IM SUSP
150.0000 mg | Freq: Once | INTRAMUSCULAR | Status: AC
  Administered 2019-12-19: 150 mg via INTRAMUSCULAR

## 2019-12-19 NOTE — Progress Notes (Signed)
   NURSE VISIT- INJECTION  SUBJECTIVE:  Samantha Rosales is a 39 y.o. [phone] female here for a Depo Provera for contraception/period management. She is a GYN patient.   OBJECTIVE:  There were no vitals taken for this visit.  Appears well, in no apparent distress  Injection administered in: Right deltoid  Meds ordered this encounter  Medications  . medroxyPROGESTERone (DEPO-PROVERA) injection 150 mg    ASSESSMENT: GYN patient Depo Provera for contraception/period management PLAN: Follow-up: in 11-13 weeks for next Depo   Jobe Marker  12/19/2019 1:01 PM

## 2019-12-22 ENCOUNTER — Telehealth: Payer: Self-pay | Admitting: *Deleted

## 2019-12-22 NOTE — Telephone Encounter (Signed)
Received VM from patient.   Reports that she has concerns about medication and possible side effects.   Call placed to patient to inquire. No answer, VM full.

## 2019-12-23 ENCOUNTER — Other Ambulatory Visit: Payer: Self-pay

## 2019-12-23 ENCOUNTER — Ambulatory Visit: Payer: BC Managed Care – PPO | Attending: Internal Medicine

## 2019-12-23 DIAGNOSIS — Z20822 Contact with and (suspected) exposure to covid-19: Secondary | ICD-10-CM

## 2019-12-24 LAB — NOVEL CORONAVIRUS, NAA: SARS-CoV-2, NAA: NOT DETECTED

## 2019-12-31 ENCOUNTER — Other Ambulatory Visit: Payer: Self-pay | Admitting: Gastroenterology

## 2020-01-05 ENCOUNTER — Ambulatory Visit
Admission: EM | Admit: 2020-01-05 | Discharge: 2020-01-05 | Disposition: A | Discharge status: Home / Self Care | Payer: BC Managed Care – PPO | Attending: Family Medicine | Admitting: Family Medicine

## 2020-01-05 ENCOUNTER — Other Ambulatory Visit: Payer: Self-pay

## 2020-01-05 DIAGNOSIS — L03818 Cellulitis of other sites: Secondary | ICD-10-CM

## 2020-01-05 DIAGNOSIS — T300 Burn of unspecified body region, unspecified degree: Secondary | ICD-10-CM

## 2020-01-05 MED ORDER — SULFAMETHOXAZOLE-TRIMETHOPRIM 800-160 MG PO TABS: 1.0000 | ORAL_TABLET | Freq: Two times a day (BID) | ORAL | 0 refills | Status: AC

## 2020-01-05 MED ORDER — SILVER SULFADIAZINE 1 % EX CREA: TOPICAL_CREAM | Freq: Every day | CUTANEOUS | Status: AC

## 2020-01-05 MED ORDER — SILVER SULFADIAZINE 1 % EX CREA: 1.0000 "application " | TOPICAL_CREAM | Freq: Two times a day (BID) | CUTANEOUS | 0 refills | Status: DC

## 2020-01-05 NOTE — Discharge Instructions (Addendum)
Instructed patient to keep her wound clean and dry.  Discussed that she should wash it gently twice a day with soap and water; and then apply the antibiotic cream twice a day.  Discussed that she should return here if she see signs of infection, such as increased pain, redness, pus-like drainage, warmth, fever, chills, or other concerning symptoms.  Patient agrees with plan of care.       Follow up with your primary care provider or come back to this office as needed.

## 2020-01-05 NOTE — ED Triage Notes (Addendum)
Pt presents to UC w/ c/o burn on top of right foot which occurred 6 days ago. Pt states she spilled boiling water on it from cooking. Pt states it became a bubble and burst. Wound is still open. Pt wanted to make sure it wasn't infected.
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
## 2020-01-16 ENCOUNTER — Encounter: Payer: BC Managed Care – PPO | Admitting: Family Medicine

## 2020-01-25 ENCOUNTER — Other Ambulatory Visit: Payer: Self-pay

## 2020-01-25 ENCOUNTER — Ambulatory Visit
Admission: EM | Admit: 2020-01-25 | Discharge: 2020-01-25 | Disposition: A | Discharge status: Home / Self Care | Payer: BC Managed Care – PPO | Attending: Emergency Medicine | Admitting: Emergency Medicine

## 2020-01-25 DIAGNOSIS — J040 Acute laryngitis: Secondary | ICD-10-CM

## 2020-01-25 DIAGNOSIS — Z20822 Contact with and (suspected) exposure to covid-19: Secondary | ICD-10-CM

## 2020-01-25 DIAGNOSIS — J069 Acute upper respiratory infection, unspecified: Secondary | ICD-10-CM

## 2020-01-25 MED ORDER — PREDNISONE 20 MG PO TABS: 20.0000 mg | ORAL_TABLET | Freq: Two times a day (BID) | ORAL | 0 refills | Status: AC

## 2020-01-25 NOTE — Discharge Instructions (Signed)
COVID testing ordered.  It will take between 2-5 days for test results.  Someone will contact you regarding abnormal results.    In the meantime: You should remain isolated in your home for 10 days from symptom onset AND greater than 72 hours after symptoms resolution (absence of fever without the use of fever-reducing medication and improvement in respiratory symptoms), whichever is longer Get plenty of rest and push fluids Prednisone prescribed.  Take as directed and to completion Use OTC medications like ibuprofen or tylenol as needed fever or pain Call or go to the ED if you have any new or worsening symptoms such as fever, worsening cough, shortness of breath, chest tightness, chest pain, turning blue, changes in mental status, etc..Marland Kitchen

## 2020-01-25 NOTE — ED Triage Notes (Signed)
Noticed dry throat 2.5 days ago.  Woke up coughing.  Friday morning was a little hoarse and as of today lost voice completely.  Some soreness in throat from coughing but primary concern is that it is 'really really dry'.  Drinking tea with honey with no relief.  Denies fever, headache, SOB, body aches.

## 2020-01-25 NOTE — ED Provider Notes (Signed)
[place]   [id number] 01/25/20 Arrival Time: 5284   CC: COVID symptoms  SUBJECTIVE: History from: patient.  Samantha Rosales is a 39 y.o. female who presents with sore throat, hoarse voice, runny nose, and dry cough x 2.5 days ago .  Denies sick exposure to COVID, flu or strep.  Denies recent travel.  Has tried warm tea with honey without relief.  Symptoms are made worse with using voice.  Denies previous symptoms in the past.   Denies fever, chills, fatigue, sinus pain, SOB, wheezing, chest pain, nausea, changes in bowel or bladder habits.    ROS: As per HPI.  All other pertinent ROS negative.     Past Medical History:  Diagnosis Date  . Abnormal Pap smear   . Alcohol use   . Allergic rhinitis   . BV (bacterial vaginosis)   . GERD (gastroesophageal reflux disease)   . Hepatitis    LFT's >1000 (2019) due to methotrexate vs alcohol  . High cholesterol   . HSV-2 (herpes simplex virus 2) infection   . Hypertension   . Pancreatitis    12/19 (thought to be due to alcohol)  . Rheumatoid arthritis ([place])   . Seizures ([place])    dx as child with epilepsy but no seizures since 89 and on no meds  . Vaginal Pap smear, abnormal    Past Surgical History:  Procedure Laterality Date  . CERVICAL CONIZATION W/BX  12/20/2012   Procedure: CONIZATION CERVIX WITH BIOPSY;  Surgeon: Florian Buff, MD;  Location: [facility];  Service: Gynecology;  Laterality: N/A;  . HOLMIUM LASER APPLICATION  1/32/4401   Procedure: HOLMIUM LASER APPLICATION;  Surgeon: Florian Buff, MD;  Location: [facility];  Service: Gynecology;  Laterality: N/A;   Allergies  Allergen Reactions  . Penicillins     Has patient had a PCN reaction causing immediate rash, facial/tongue/throat swelling, SOB or lightheadedness with hypotension: Unknown Has patient had a PCN reaction causing severe rash involving mucus membranes or skin necrosis: Unknown Has patient had a PCN reaction that required hospitalization: Unknown Has  patient had a PCN reaction occurring within the last 10 years: No If all of the above answers are "NO", then may proceed with Cephalosporin use.    No current facility-administered medications on file prior to encounter.   Current Outpatient Medications on File Prior to Encounter  Medication Sig Dispense Refill  . Biotin 1000 MCG tablet Take 1,000 mcg by mouth daily.    . Cholecalciferol (VITAMIN D-3) 1000 UNITS CAPS Take 1 capsule by mouth daily.    Marland Kitchen escitalopram (LEXAPRO) 10 MG tablet TAKE 1 TABLET BY MOUTH EVERY DAY 90 tablet 3  . fenofibrate micronized (LOFIBRA) 200 MG capsule Take 1 capsule (200 mg total) by mouth at bedtime. 90 capsule 3  . fluticasone (FLONASE) 50 MCG/ACT nasal spray USE TWO SPRAY(S) IN EACH NOSTRIL ONCE DAILY 16 g 11  . folic acid (FOLVITE) 1 MG tablet Take 1 mg by mouth daily.    Marland Kitchen HUMIRA PEN 40 MG/0.8ML PNKT Inject 1 Dose into the skin every 14 (fourteen) days.   0  . Icosapent Ethyl (VASCEPA) 1 g CAPS Take 2 capsules (2 g total) by mouth 2 (two) times daily. 120 capsule 11  . lipase/protease/amylase (CREON) 12000 units CPEP capsule Take 1 capsule (12,000 Units total) by mouth 3 (three) times daily before meals. 270 capsule 1  . losartan (COZAAR) 50 MG tablet Take 1 tablet (50 mg total) by mouth daily. [place]  tablet 3  . medroxyPROGESTERone (DEPO-PROVERA) 150 MG/ML injection INJECT 1 ML INTRAMUSCULARLY IN DOCTOR'S OFFICE EVERY 3 MONTHS. 1 mL 4  . nebivolol (BYSTOLIC) 10 MG tablet Take 1 tablet (10 mg total) by mouth daily. 90 tablet 3  . pantoprazole (PROTONIX) 40 MG tablet TAKE 1 TABLET BY MOUTH TWICE A DAY 60 tablet 0  . silver sulfADIAZINE (SILVADENE) 1 % cream Apply 1 application topically 2 (two) times daily. 50 g 0  . tiZANidine (ZANAFLEX) 4 MG tablet Take 1 tablet (4 mg total) by mouth every 6 (six) hours as needed for muscle spasms. 30 tablet 0  . [DISCONTINUED] SUMAtriptan (IMITREX) 50 MG tablet Take 1 tablet (50 mg total) by mouth every 2 (two) hours as needed  for migraine. May repeat in 2 hours if headache persists or recurs. 10 tablet 3   Social History   Socioeconomic History  . Marital status: Single    Spouse name: Not on file  . Number of children: 2  . Years of education: Not on file  . Highest education level: Not on file  Occupational History  . 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 Smoking status: Current Every Day Smoker    Packs/day: 0.50    Years: 15.00    Pack years: 7.50    Types: Cigarettes  . Smokeless tobacco: Never Used  Substance and Sexual Activity  . Alcohol use: Yes    Comment: on the weekends drinks with friends  . Drug use: No  . Sexual activity: Yes    Birth control/protection: Injection    Comment: Depo Provera every 3 months  Other Topics Concern  . Not on file  Social History Narrative  . Not on file   Social Determinants of Health   Financial Resource Strain:   . Difficulty of Paying Living Expenses: Not on file  Food Insecurity:   . Worried About Programme researcher, broadcasting/film/video in the Last Year: Not on file  . Ran Out of Food in the Last Year: Not on file  Transportation Needs:   . Lack of Transportation (Medical): Not on file  . Lack of Transportation (Non-Medical): Not on file  Physical Activity:   . Days of Exercise per Week: Not on file  . Minutes of Exercise per Session: Not on file  Stress:   . Feeling of Stress : Not on file  Social Connections:   . Frequency of Communication with Friends and Family: Not on file  . Frequency of Social Gatherings with Friends and Family: Not on file  . Attends Religious Services: Not on file  . Active Member of Clubs or Organizations: Not on file  . Attends Banker Meetings: Not on file  . Marital Status: Not on file  Intimate Partner Violence:   . Fear of Current or Ex-Partner: Not on file  . Emotionally Abused: Not on file  . Physically Abused: Not on file  . Sexually Abused: Not on file    Family History  Problem Relation Age of Onset  . Hypertension Mother   . Hypertension Father   . Pancreatic cancer Paternal Uncle   . Colon cancer Neg Hx   . Esophageal cancer Neg Hx   . Inflammatory bowel disease Neg Hx   . Liver disease Neg Hx   . Rectal cancer Neg Hx   . Stomach cancer Neg Hx     OBJECTIVE:  Vitals:   01/25/20 0847  BP: Marland Kitchen)  148/100  Pulse: (!) 111  Resp: 18  Temp: 99.3 F (37.4 C)  TempSrc: Oral  SpO2: 97%     General appearance: alert; appears mildly fatigued, but nontoxic; speaking in full sentences and tolerating own secretions HEENT: NCAT; Ears: EACs clear, TMs pearly gray; Eyes: PERRL.  EOM grossly intact. Nose: nares patent without rhinorrhea, Throat: oropharynx clear, tonsils non erythematous or enlarged, uvula midline; voice hoarse Neck: supple without LAD Lungs: unlabored respirations, symmetrical air entry; cough: mild; no respiratory distress; CTAB Heart: regular rate and rhythm.   Skin: warm and dry Psychological: alert and cooperative; normal mood and affect  ASSESSMENT & PLAN:  1. Suspected COVID-19 virus infection   2. Laryngitis   3. Viral URI with cough     Meds ordered this encounter  Medications  . predniSONE (DELTASONE) 20 MG tablet    Sig: Take 1 tablet (20 mg total) by mouth 2 (two) times daily with a meal for 5 days.    Dispense:  10 tablet    Refill:  0    Order Specific Question:   Supervising Provider    Answer:   Eustace Moore [[id number]]   COVID testing ordered.  It will take between 2-5 days for test results.  Someone will contact you regarding abnormal results.    In the meantime: You should remain isolated in your home for 10 days from symptom onset AND greater than 72 hours after symptoms resolution (absence of fever without the use of fever-reducing medication and improvement in respiratory symptoms), whichever is longer Get plenty of rest and push fluids Prednisone prescribed.  Take as directed and to  completion Use OTC medications like ibuprofen or tylenol as needed fever or pain Call or go to the ED if you have any new or worsening symptoms such as fever, worsening cough, shortness of breath, chest tightness, chest pain, turning blue, changes in mental status, etc...   Reviewed expectations re: course of current medical issues. Questions answered. Outlined signs and symptoms indicating need for more acute intervention. Patient verbalized understanding. After Visit Summary given.         Rennis Harding, PA-C 01/25/20 1312

## 2020-01-26 LAB — NOVEL CORONAVIRUS, NAA: SARS-CoV-2, NAA: NOT DETECTED

## 2020-02-20 ENCOUNTER — Encounter: Payer: BC Managed Care – PPO | Admitting: Family Medicine

## 2020-03-04 ENCOUNTER — Other Ambulatory Visit: Payer: Self-pay | Admitting: Family Medicine

## 2020-03-05 ENCOUNTER — Ambulatory Visit: Payer: BC Managed Care – PPO

## 2020-03-12 ENCOUNTER — Ambulatory Visit (INDEPENDENT_AMBULATORY_CARE_PROVIDER_SITE_OTHER): Payer: BC Managed Care – PPO | Admitting: *Deleted

## 2020-03-12 ENCOUNTER — Other Ambulatory Visit: Payer: Self-pay

## 2020-03-12 DIAGNOSIS — Z3042 Encounter for surveillance of injectable contraceptive: Secondary | ICD-10-CM

## 2020-03-12 MED ORDER — MEDROXYPROGESTERONE ACETATE 150 MG/ML IM SUSP
150.0000 mg | Freq: Once | INTRAMUSCULAR | Status: AC
  Administered 2020-03-12: 150 mg via INTRAMUSCULAR

## 2020-03-12 NOTE — Progress Notes (Signed)
   NURSE VISIT- INJECTION  SUBJECTIVE:  Samantha Rosales is a 39 y.o. [phone] female here for a Depo Provera for contraception/period management. She is a GYN patient.   OBJECTIVE:  There were no vitals taken for this visit.  Appears well, in no apparent distress  Injection administered in: Left Deltoid  Meds ordered this encounter  Medications  . medroxyPROGESTERone (DEPO-PROVERA) injection 150 mg    ASSESSMENT: GYN patient Depo Provera for contraception/period management PLAN: Follow-up: in 11-13 weeks for next Depo   Stoney Bang  03/12/2020 11:52 AM

## 2020-03-16 ENCOUNTER — Encounter: Payer: BC Managed Care – PPO | Admitting: Family Medicine

## 2020-03-26 ENCOUNTER — Ambulatory Visit: Payer: BC Managed Care – PPO | Attending: Internal Medicine

## 2020-03-26 DIAGNOSIS — Z23 Encounter for immunization: Secondary | ICD-10-CM

## 2020-03-26 NOTE — Progress Notes (Signed)
   Covid-19 Vaccination Clinic  Name:  CLOTILDA HAFER    MRN: [id number] DOB: 05-May-1981  03/26/2020  Ms. Sharp was observed post Covid-19 immunization for 15 minutes without incident. She was provided with Vaccine Information Sheet and instruction to access the V-Safe system.   Ms. Herrig was instructed to call 911 with any severe reactions post vaccine: Marland Kitchen Difficulty breathing  . Swelling of face and throat  . A fast heartbeat  . A bad rash all over body  . Dizziness and weakness   Immunizations Administered    Name Date Dose VIS Date Route   Pfizer COVID-19 Vaccine 03/26/2020  8:20 AM 0.3 mL 01/28/2019 Intramuscular   Manufacturer: [employer], [employer]   Lot: [id number]   NDC: [address]-5

## 2020-04-01 ENCOUNTER — Encounter: Payer: BC Managed Care – PPO | Admitting: Family Medicine

## 2020-04-15 ENCOUNTER — Ambulatory Visit (INDEPENDENT_AMBULATORY_CARE_PROVIDER_SITE_OTHER): Payer: BC Managed Care – PPO | Admitting: Family Medicine

## 2020-04-15 ENCOUNTER — Other Ambulatory Visit: Payer: Self-pay

## 2020-04-15 ENCOUNTER — Encounter: Payer: Self-pay | Admitting: Family Medicine

## 2020-04-15 VITALS — BP 184/110 | HR 120 | Temp 97.6°F | Resp 14 | Ht 68.0 in | Wt 145.0 lb

## 2020-04-15 DIAGNOSIS — R1010 Upper abdominal pain, unspecified: Secondary | ICD-10-CM | POA: Diagnosis not present

## 2020-04-15 DIAGNOSIS — R634 Abnormal weight loss: Secondary | ICD-10-CM | POA: Diagnosis not present

## 2020-04-15 DIAGNOSIS — I10 Essential (primary) hypertension: Secondary | ICD-10-CM | POA: Diagnosis not present

## 2020-04-15 MED ORDER — NEBIVOLOL HCL 10 MG PO TABS: 10.0000 mg | ORAL_TABLET | Freq: Every day | ORAL | 3 refills | Status: DC

## 2020-04-15 NOTE — Progress Notes (Signed)
Subjective:    Patient ID: Samantha Rosales, female    DOB: August 22, 1981, 39 y.o.   MRN: [id number]  HPI  06/2018 Patient has a history of mild elevations in her liver function test as evidenced on her blood work in October of last year.  Recently was found to have significant elevations in her liver function test with an AST greater than 1000 and an ALT greater than 800.  There was also mild elevations in her alkaline phosphatase, her bilirubin was greater than 3 and she had a mildly elevated lipase.  She was referred to the emergency room where right upper quadrant ultrasound was negative for any evidence of bile duct obstruction.  Viral hepatitis serologies were negative.  She was instructed to follow-up with her PCP and is here today for follow-up.  I had already recommended that the patient discontinue fenofibrate immediately.  Of note she is also taking methotrexate for rheumatoid arthritis.  She has been on this medication for years.  She denies any excessive alcohol use.  She states that she may drink a glass of wine occasionally.  She denies any other hepatotoxic medication use such as Tylenol.  At that time, my plan was: Given the severity of her liver function test elevation, I am concerned about medication induced hepatotoxicity from methotrexate versus autoimmune hepatitis.  Repeat CMP today.  If liver function test remains significantly elevated, I will consult GI.  I have asked the patient to discontinue methotrexate altogether and I will notify her rheumatologist, Dr. Dierdre Forth of the significant elevation in her liver function test.  I would like to repeat liver function test in 2 weeks after discontinuing methotrexate.  I will also begin work-up for autoimmune hepatitis but checking an ANA, and antimitochondrial antibody, and anti-liver kidney antibody.  Spent approximately 25 minutes with the patient discussing her lab test, diagnosis and work up.    11/14/19 Elevations in liver function  test were thought to be due to methotrexate and the medication was discontinued.  However in December of last year, the patient was seen in the emergency room for abdominal pain and was found to have acute pancreatitis.  Patient states that this was thought to be due to alcohol.  Patient was recently admitted to the hospital with delirium.  She was having tremor.  She was confused and lethargic.  Her blood alcohol level was undetectable.  Urine drug screen was negative.  Head CT was normal.  Lab work was significant for mild anemia, mild elevations in liver function test.  Diagnosis was possible alcohol withdrawal coupled with hypertensive urgency.  Patient is here today for follow-up.  She states that the day before she was hospitalized, she had had "5 shots".  She had not drank anything the day that she was hospitalized.  She has no recollection of what happened.  Her daughter found her shaking and confused.  EMS was called.  Since discharge from the hospital, she states that she is drinking occasionally.  She states maybe she is drinking 3 shots of alcohol a day.  She states that she has lost her insurance and she has not been taking Humira for her RA.  As result she has terrible back pain and is having muscle spasms in her back for which she is requesting pain medication.  Blood pressure today is much better at 114/72.  Lab work in the hospital was concerning for subclinical hypothyroidism.  At that time, my plan was: Reviewing her past records, I now  believe that the elevated liver function test may have been alcohol induced hepatitis coupled with methotrexate.  She then had an episode of pancreatitis that she admits was due to alcohol.  I believe that most recently she was experiencing possible delirium tremens or possible alcohol withdrawal syndrome.  I question the veracity of her reported alcohol use.  I will check a level today.  I have strongly suggested an outpatient treatment program such as fellowship  I will or alcoholics anonymous.  I believe she needs physician monitored alcohol withdrawal to prevent delirium tremens and therefore I have recommended fellowship I will.  Patient is somewhat in denial and is precontemplative.  I will recheck her lab work today and also monitor her subclinical hypothyroidism.  I will give the patient Zanaflex 4 mg tablets every 6 hours as needed for muscle spasms in her back.  I strongly cautioned the patient not to mix this with any mood altering substances or alcohol due to the sedative effect.  04/15/20 Wt Readings from Last 3 Encounters:  04/15/20 145 lb (65.8 kg)  11/14/19 158 lb (71.7 kg)  10/17/19 151 lb 6.4 oz (68.7 kg)   Since I last saw the patient, she has lost 13 pounds.  She states that she has very little appetite.  At other times she has abdominal discomfort in the upper abdomen.  She has seen no further episodes of hematochezia.  She denies any melena.  However she reports poor appetite, nausea, and early satiety.  Her blood pressure today is extremely high at 184/110.  The patient stopped her medication because she was concerned that her medicines were making her not want to eat.  She has been off her medication for approximately 1 month however the symptoms have not improved.  The only medication that she is taking is her Humira.  She is still drinking however she states that she is drinking very little.  She denies any heavy drinking.  She denies any chest pain shortness of breath or dyspnea on exertion.  She denies any dysuria or hematuria.  She denies any diarrhea.  Today was supposed to be a complete physical exam however given the weight loss and her blood pressure and we focused on her symptoms rather than performing a physical exam Past Medical History:  Diagnosis Date  . Abnormal Pap smear   . Alcohol use   . Allergic rhinitis   . BV (bacterial vaginosis)   . GERD (gastroesophageal reflux disease)   . Hepatitis    LFT's >1000 (2019) due to  methotrexate vs alcohol  . High cholesterol   . HSV-2 (herpes simplex virus 2) infection   . Hypertension   . Pancreatitis    12/19 (thought to be due to alcohol)  . Rheumatoid arthritis (HCC)   . Seizures (HCC)    dx as child with epilepsy but no seizures since 89 and on no meds  . Vaginal Pap smear, abnormal    Past Surgical History:  Procedure Laterality Date  . CERVICAL CONIZATION W/BX  12/20/2012   Procedure: CONIZATION CERVIX WITH BIOPSY;  Surgeon: Lazaro Arms, MD;  Location: [facility];  Service: Gynecology;  Laterality: N/A;  . HOLMIUM LASER APPLICATION  12/20/2012   Procedure: HOLMIUM LASER APPLICATION;  Surgeon: Lazaro Arms, MD;  Location: [facility];  Service: Gynecology;  Laterality: N/A;   Current Outpatient Medications on File Prior to Visit  Medication Sig Dispense Refill  . HUMIRA PEN 40 MG/0.8ML PNKT Inject 1 Dose into the  skin every 14 (fourteen) days.   0  . Biotin 1000 MCG tablet Take 1,000 mcg by mouth daily.    . Cholecalciferol (VITAMIN D-3) 1000 UNITS CAPS Take 1 capsule by mouth daily.    Marland Kitchen escitalopram (LEXAPRO) 10 MG tablet TAKE 1 TABLET BY MOUTH EVERY DAY (Patient not taking: Reported on 04/15/2020) 90 tablet 3  . fenofibrate micronized (LOFIBRA) 200 MG capsule Take 1 capsule (200 mg total) by mouth at bedtime. (Patient not taking: Reported on 04/15/2020) 90 capsule 3  . fluticasone (FLONASE) 50 MCG/ACT nasal spray USE TWO SPRAY(S) IN EACH NOSTRIL ONCE DAILY (Patient not taking: Reported on 04/15/2020) 16 g 11  . folic acid (FOLVITE) 1 MG tablet Take 1 mg by mouth daily.    Bess Harvest Ethyl (VASCEPA) 1 g CAPS Take 2 capsules (2 g total) by mouth 2 (two) times daily. (Patient not taking: Reported on 04/15/2020) 120 capsule 11  . losartan (COZAAR) 50 MG tablet TAKE 1 TABLET BY MOUTH ONCE A DAY (Patient not taking: Reported on 04/15/2020) 90 tablet 1  . medroxyPROGESTERone (DEPO-PROVERA) 150 MG/ML injection INJECT 1 ML INTRAMUSCULARLY IN DOCTOR'S OFFICE EVERY 3 MONTHS.  (Patient not taking: Reported on 04/15/2020) 1 mL 4  . nebivolol (BYSTOLIC) 10 MG tablet Take 1 tablet (10 mg total) by mouth daily. (Patient not taking: Reported on 04/15/2020) 90 tablet 3  . pantoprazole (PROTONIX) 40 MG tablet TAKE 1 TABLET BY MOUTH TWICE A DAY (Patient not taking: Reported on 04/15/2020) 60 tablet 0  . silver sulfADIAZINE (SILVADENE) 1 % cream Apply 1 application topically 2 (two) times daily. (Patient not taking: Reported on 04/15/2020) 50 g 0  . tiZANidine (ZANAFLEX) 4 MG tablet Take 1 tablet (4 mg total) by mouth every 6 (six) hours as needed for muscle spasms. (Patient not taking: Reported on 04/15/2020) 30 tablet 0  . [DISCONTINUED] SUMAtriptan (IMITREX) 50 MG tablet Take 1 tablet (50 mg total) by mouth every 2 (two) hours as needed for migraine. May repeat in 2 hours if headache persists or recurs. 10 tablet 3   No current facility-administered medications on file prior to visit.   Allergies  Allergen Reactions  . Penicillins     Has patient had a PCN reaction causing immediate rash, facial/tongue/throat swelling, SOB or lightheadedness with hypotension: Unknown Has patient had a PCN reaction causing severe rash involving mucus membranes or skin necrosis: Unknown Has patient had a PCN reaction that required hospitalization: Unknown Has patient had a PCN reaction occurring within the last 10 years: No If all of the above answers are "NO", then may proceed with Cephalosporin use.    Social History   Socioeconomic History  . Marital status: Single    Spouse name: Not on file  . Number of children: 2  . Years of education: Not on file  . Highest education level: Not on file  Occupational History  . 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 Smoking status: Current Every Day Smoker    Packs/day: 0.50    Years: 15.00    Pack years: 7.50    Types: Cigarettes  . Smokeless tobacco: Never Used  Substance and Sexual  Activity  . Alcohol use: Yes    Comment: on the weekends drinks with friends  . Drug use: No  . Sexual activity: Yes    Birth control/protection: Injection    Comment: Depo Provera every 3 months  Other Topics Concern  . Not on file  Social History Narrative  . Not on file   Social Determinants of Health   Financial Resource Strain:   . Difficulty of Paying Living Expenses:   Food Insecurity:   . Worried About Programme researcher, broadcasting/film/video in the Last Year:   . Barista in the Last Year:   Transportation Needs:   . Freight forwarder (Medical):   Marland Kitchen Lack of Transportation (Non-Medical):   Physical Activity:   . Days of Exercise per Week:   . Minutes of Exercise per Session:   Stress:   . Feeling of Stress :   Social Connections:   . Frequency of Communication with Friends and Family:   . Frequency of Social Gatherings with Friends and Family:   . Attends Religious Services:   . Active Member of Clubs or Organizations:   . Attends Banker Meetings:   Marland Kitchen Marital Status:   Intimate Partner Violence:   . Fear of Current or Ex-Partner:   . Emotionally Abused:   Marland Kitchen Physically Abused:   . Sexually Abused:    Family History  Problem Relation Age of Onset  . Hypertension Mother   . Hypertension Father   . Pancreatic cancer Paternal Uncle   . Colon cancer Neg Hx   . Esophageal cancer Neg Hx   . Inflammatory bowel disease Neg Hx   . Liver disease Neg Hx   . Rectal cancer Neg Hx   . Stomach cancer Neg Hx       Review of Systems  All other systems reviewed and are negative.      Objective:   Physical Exam  Constitutional: She is oriented to person, place, and time. She appears well-developed and well-nourished. No distress.  HENT:  Head: Normocephalic and atraumatic.  Right Ear: External ear normal.  Left Ear: External ear normal.  Nose: Nose normal.  Mouth/Throat: Oropharynx is clear and moist. No oropharyngeal exudate.  Eyes: Pupils are equal,  round, and reactive to light. Conjunctivae are normal. Right eye exhibits no discharge. Left eye exhibits no discharge. No scleral icterus.  Neck: No JVD present. No tracheal deviation present. No thyromegaly present.  Cardiovascular: Normal rate, regular rhythm and intact distal pulses. Exam reveals no gallop and no friction rub.  No murmur heard. Pulmonary/Chest: Effort normal and breath sounds normal. No stridor. No respiratory distress. She has no wheezes. She has no rales. She exhibits no tenderness.  Abdominal: Soft. Bowel sounds are normal. She exhibits no distension and no mass. There is no abdominal tenderness. There is no rebound and no guarding.  Musculoskeletal:        General: No tenderness, deformity or edema.     Cervical back: Neck supple.  Lymphadenopathy:    She has no cervical adenopathy.  Neurological: She is alert and oriented to person, place, and time. She has normal reflexes. No cranial nerve deficit. She exhibits normal muscle tone. Coordination normal.  Skin: Skin is warm and dry. No rash noted. She is not diaphoretic. No erythema. No pallor.  Psychiatric: She has a normal mood and affect. Her behavior is normal. Judgment and thought content normal.  Vitals reviewed.         Assessment & Plan:  Pain of upper abdomen - Plan: CBC with Differential/Platelet, COMPLETE METABOLIC PANEL WITH GFR, Lipid panel, TSH, Lipase, Fecal Globin By Immunochemistry  Weight loss - Plan: CBC with Differential/Platelet, COMPLETE METABOLIC PANEL WITH GFR, Lipid panel, TSH, Lipase, Fecal Globin By Immunochemistry  Benign essential HTN  Blood pressure is extremely high today.  I would like the patient to resume Bystolic 10 mg a day and then reassess blood pressure in 1 week.  However the majority of her time was spent discussing her weight loss and abdominal discomfort and early satiety.  Differential diagnosis includes acute pancreatitis, chronic pancreatitis with pancreatic  insufficiency, biliary dyskinesia, gastritis, ulcer, gastric malignancy.  Begin by obtaining a CBC, CMP, TSH, lipase, and check stool for blood.  If there is blood in the stool I would recommend a GI consultation for EGD and colonoscopy immediately.  If there is no blood in stool, given the weight loss and her previous history of acute pancreatitis, I would be concerned about chronic pancreatitis.  I would recommend a CT scan of the abdomen and pelvis to evaluate further and likely GI consultation for chronic pancreatitis.  Await results of labs and fecal occult blood testing to determine next step.

## 2020-04-16 ENCOUNTER — Other Ambulatory Visit: Payer: Self-pay | Admitting: Family Medicine

## 2020-04-16 DIAGNOSIS — E876 Hypokalemia: Secondary | ICD-10-CM

## 2020-04-16 LAB — COMPLETE METABOLIC PANEL WITH GFR
AG Ratio: 1.5 (calc) (ref 1.0–2.5)
ALT: 41 U/L — ABNORMAL HIGH (ref 6–29)
AST: 92 U/L — ABNORMAL HIGH (ref 10–30)
Albumin: 4.3 g/dL (ref 3.6–5.1)
Alkaline phosphatase (APISO): 108 U/L (ref 31–125)
BUN: 10 mg/dL (ref 7–25)
CO2: 23 mmol/L (ref 20–32)
Calcium: 8.9 mg/dL (ref 8.6–10.2)
Chloride: 108 mmol/L (ref 98–110)
Creat: 0.61 mg/dL (ref 0.50–1.10)
GFR, Est African American: 132 mL/min/{1.73_m2} (ref >=60)
GFR, Est Non African American: 114 mL/min/{1.73_m2} (ref >=60)
Globulin: 2.9 g/dL (calc) (ref 1.9–3.7)
Glucose, Bld: 144 mg/dL — ABNORMAL HIGH (ref 65–99)
Potassium: 3.2 mmol/L — ABNORMAL LOW (ref 3.5–5.3)
Sodium: 143 mmol/L (ref 135–146)
Total Bilirubin: 0.3 mg/dL (ref 0.2–1.2)
Total Protein: 7.2 g/dL (ref 6.1–8.1)

## 2020-04-16 LAB — CBC WITH DIFFERENTIAL/PLATELET
Absolute Monocytes: 394 cells/uL (ref 200–950)
Basophils Absolute: 20 cells/uL (ref 0–200)
Basophils Relative: 0.5 %
Eosinophils Absolute: 20 cells/uL (ref 15–500)
Eosinophils Relative: 0.5 %
HCT: 35.8 % (ref 35.0–45.0)
Hemoglobin: 12.1 g/dL (ref 11.7–15.5)
Lymphs Abs: 2129 cells/uL (ref 850–3900)
MCH: 33.5 pg — ABNORMAL HIGH (ref 27.0–33.0)
MCHC: 33.8 g/dL (ref 32.0–36.0)
MCV: 99.2 fL (ref 80.0–100.0)
MPV: 11.6 fL (ref 7.5–12.5)
Monocytes Relative: 10.1 %
Neutro Abs: 1338 cells/uL — ABNORMAL LOW (ref 1500–7800)
Neutrophils Relative %: 34.3 %
Platelets: 160 10*3/uL (ref 140–400)
RBC: 3.61 10*6/uL — ABNORMAL LOW (ref 3.80–5.10)
RDW: 12.4 % (ref 11.0–15.0)
Total Lymphocyte: 54.6 %
WBC: 3.9 10*3/uL (ref 3.8–10.8)

## 2020-04-16 LAB — LIPID PANEL
Cholesterol: 182 mg/dL (ref <200)
HDL: 59 mg/dL (ref >=50)
LDL Cholesterol (Calc): 89 mg/dL (calc)
Non-HDL Cholesterol (Calc): 123 mg/dL (calc) (ref <130)
Total CHOL/HDL Ratio: 3.1 (calc) (ref <5.0)
Triglycerides: 256 mg/dL — ABNORMAL HIGH (ref <150)

## 2020-04-16 LAB — LIPASE: Lipase: 23 U/L (ref 7–60)

## 2020-04-16 LAB — TSH: TSH: 1.33 mIU/L

## 2020-04-16 MED ORDER — POTASSIUM CHLORIDE CRYS ER 20 MEQ PO TBCR: 20.0000 meq | EXTENDED_RELEASE_TABLET | Freq: Every day | ORAL | 0 refills | Status: DC

## 2020-04-19 ENCOUNTER — Other Ambulatory Visit: Payer: BC Managed Care – PPO

## 2020-04-19 ENCOUNTER — Other Ambulatory Visit: Payer: Self-pay

## 2020-04-19 ENCOUNTER — Ambulatory Visit: Payer: BC Managed Care – PPO | Attending: Internal Medicine

## 2020-04-19 DIAGNOSIS — Z23 Encounter for immunization: Secondary | ICD-10-CM

## 2020-04-19 NOTE — Progress Notes (Signed)
   Covid-19 Vaccination Clinic  Name:  KETSIA LINEBAUGH    MRN: [id number] DOB: [birth year]  04/19/2020  Ms. Foiles was observed post Covid-19 immunization for 15 minutes without incident. She was provided with Vaccine Information Sheet and instruction to access the V-Safe system.   Ms. Granados was instructed to call 911 with any severe reactions post vaccine: Marland Kitchen Difficulty breathing  . Swelling of face and throat  . A fast heartbeat  . A bad rash all over body  . Dizziness and weakness   Immunizations Administered    Name Date Dose VIS Date Route   Pfizer COVID-19 Vaccine 04/19/2020  8:11 AM 0.3 mL 01/28/2019 Intramuscular   Manufacturer: [employer], [employer]   Lot: [id number]   NDC: [address]-4

## 2020-04-20 LAB — FECAL GLOBIN BY IMMUNOCHEMISTRY
FECAL GLOBIN RESULT:: NOT DETECTED
MICRO NUMBER:: 10485360
SPECIMEN QUALITY:: ADEQUATE

## 2020-04-27 ENCOUNTER — Other Ambulatory Visit: Payer: Self-pay | Admitting: Family Medicine

## 2020-04-27 DIAGNOSIS — R1010 Upper abdominal pain, unspecified: Secondary | ICD-10-CM

## 2020-04-27 DIAGNOSIS — K859 Acute pancreatitis without necrosis or infection, unspecified: Secondary | ICD-10-CM

## 2020-04-27 DIAGNOSIS — R634 Abnormal weight loss: Secondary | ICD-10-CM

## 2020-04-27 DIAGNOSIS — K921 Melena: Secondary | ICD-10-CM

## 2020-05-13 ENCOUNTER — Inpatient Hospital Stay: Admission: RE | Admit: 2020-05-13 | Payer: BC Managed Care – PPO | Source: Ambulatory Visit

## 2020-05-28 ENCOUNTER — Ambulatory Visit (INDEPENDENT_AMBULATORY_CARE_PROVIDER_SITE_OTHER): Payer: BC Managed Care – PPO | Admitting: *Deleted

## 2020-05-28 ENCOUNTER — Ambulatory Visit
Admission: RE | Admit: 2020-05-28 | Discharge: 2020-05-28 | Disposition: A | Discharge status: Home / Self Care | Payer: BC Managed Care – PPO | Source: Ambulatory Visit | Attending: Family Medicine | Admitting: Family Medicine

## 2020-05-28 DIAGNOSIS — Z3042 Encounter for surveillance of injectable contraceptive: Secondary | ICD-10-CM | POA: Diagnosis not present

## 2020-05-28 DIAGNOSIS — K859 Acute pancreatitis without necrosis or infection, unspecified: Secondary | ICD-10-CM

## 2020-05-28 DIAGNOSIS — R1010 Upper abdominal pain, unspecified: Secondary | ICD-10-CM

## 2020-05-28 DIAGNOSIS — R634 Abnormal weight loss: Secondary | ICD-10-CM

## 2020-05-28 DIAGNOSIS — K921 Melena: Secondary | ICD-10-CM

## 2020-05-28 IMAGING — CT CT ABD-PELV W/ CM
2 of 4 series · 13 of 46 positions shown, 15 images · IV contrast (iopamidol)
Comparison: [DATE] from [HOSPITAL]

CLINICAL DATA: 30 lb weight loss in past eight months. Abdominal
pain for 3 days. Hematochezia. Personal history of pancreatitis.

EXAM:
CT ABDOMEN AND PELVIS WITH CONTRAST
TECHNIQUE: Multidetector CT imaging of the abdomen and pelvis was performed
using the standard protocol following bolus administration of
intravenous contrast.
CONTRAST:  100mL [G8] IOPAMIDOL ([G8]) INJECTION 61%

[Series 2: abd pelvis 5.00 br40 s3 axial · axial · 0.56mm/px · z∈[+1173,+1558]mm · 10 of 93 slices shown, 12 images]
[im 8/93  soft-tissue]
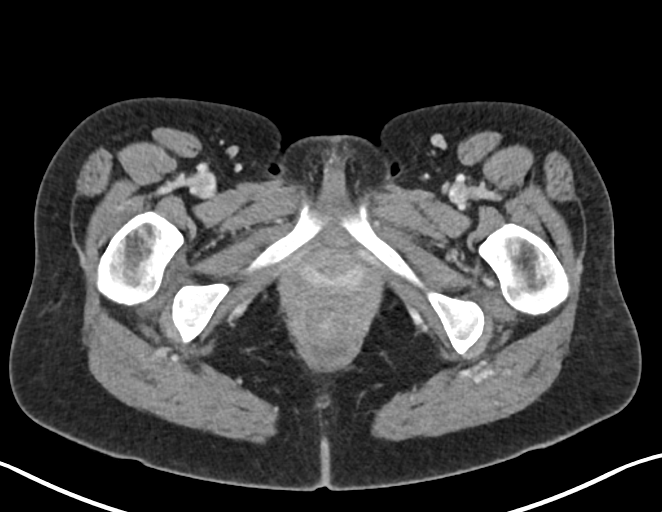
[im 8/93  bone]
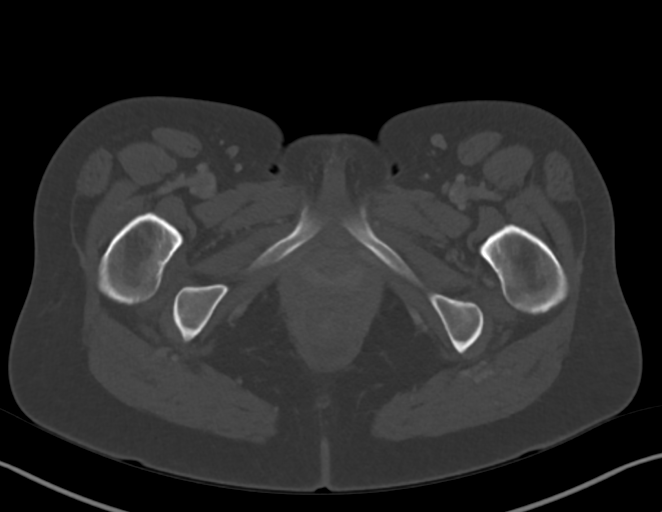
[im 16/93  soft-tissue]
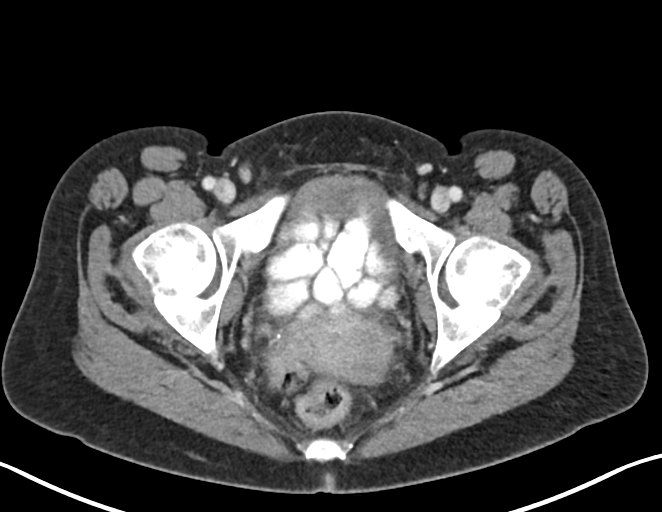
[im 24/93  soft-tissue]
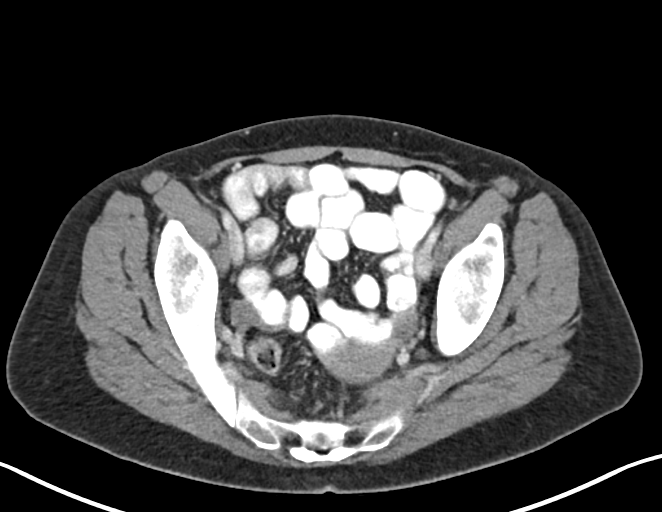
[im 35/93  soft-tissue]
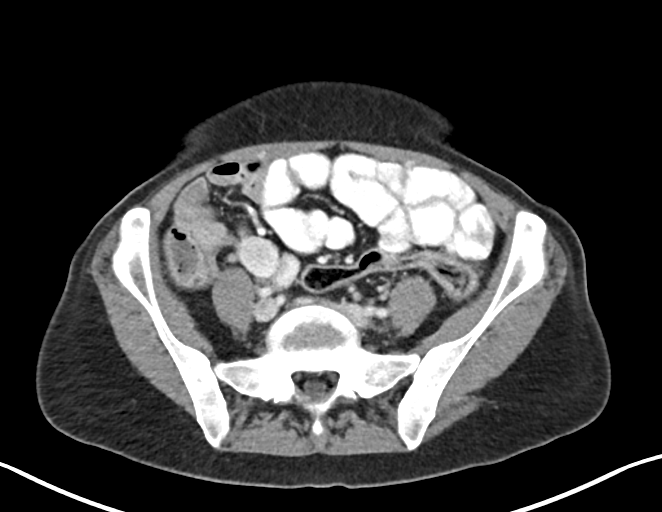
[im 43/93  soft-tissue]
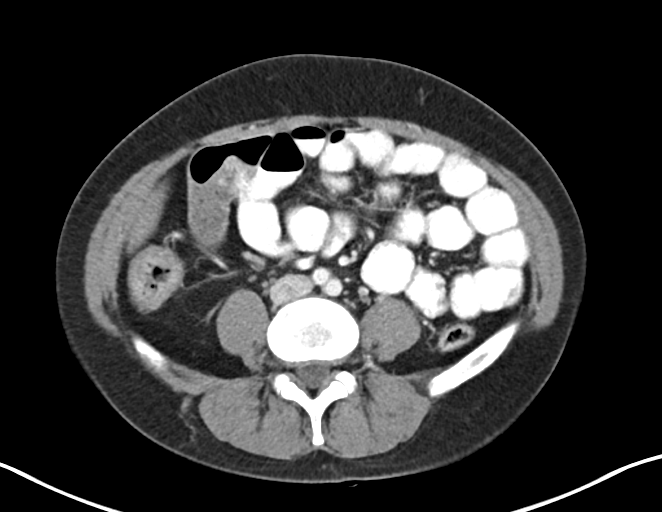
[im 50/93  soft-tissue]
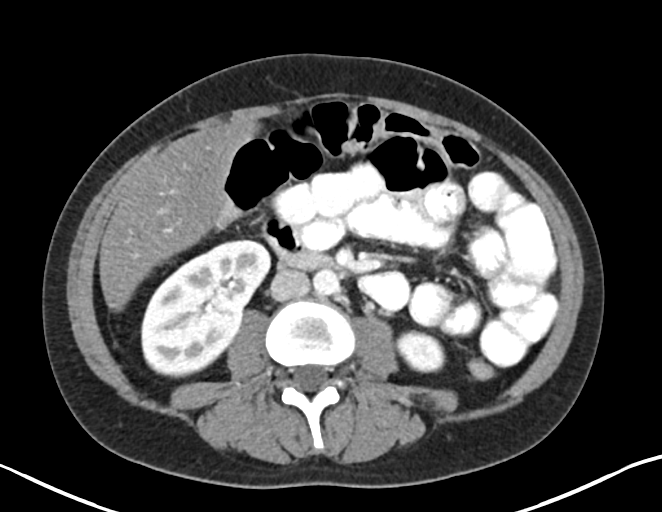
[im 58/93  soft-tissue]
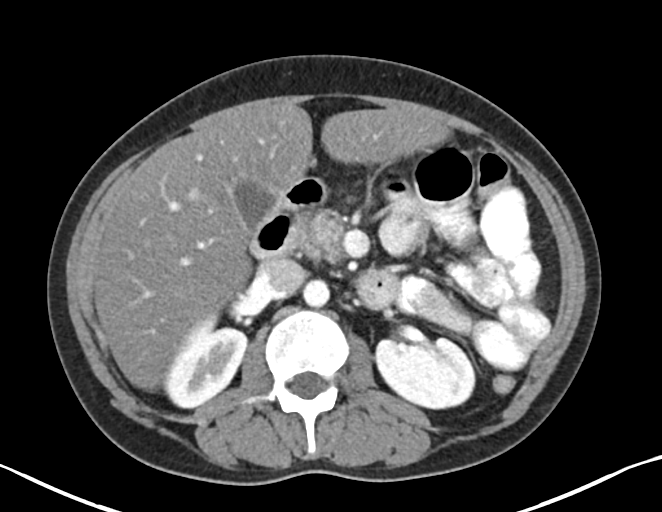
[im 70/93  soft-tissue]
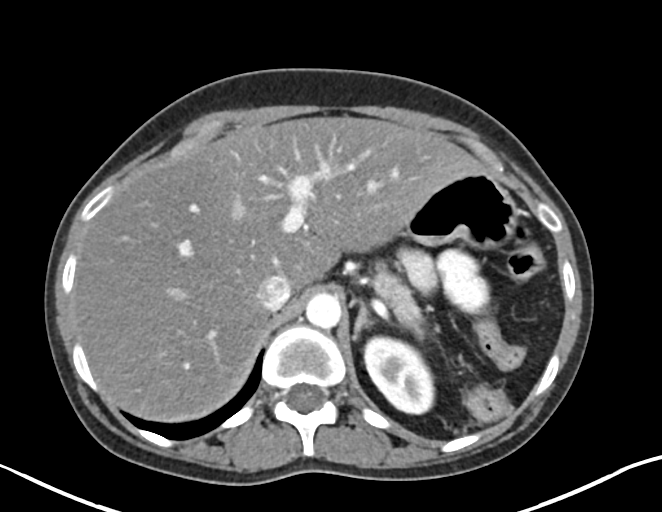
[im 77/93  soft-tissue]
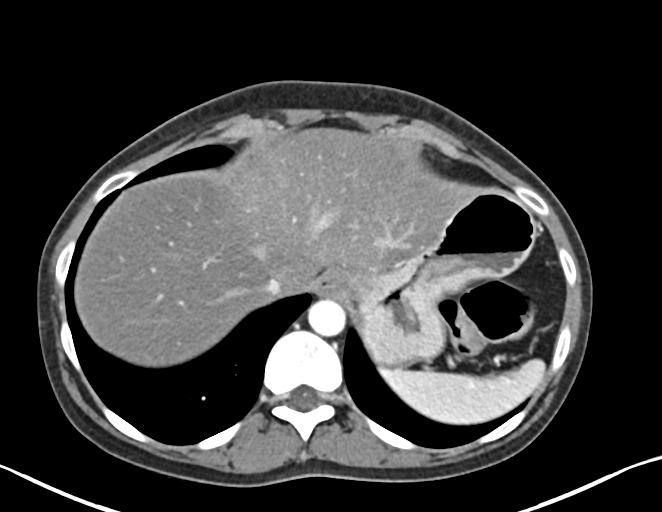
[im 77/93  bone]
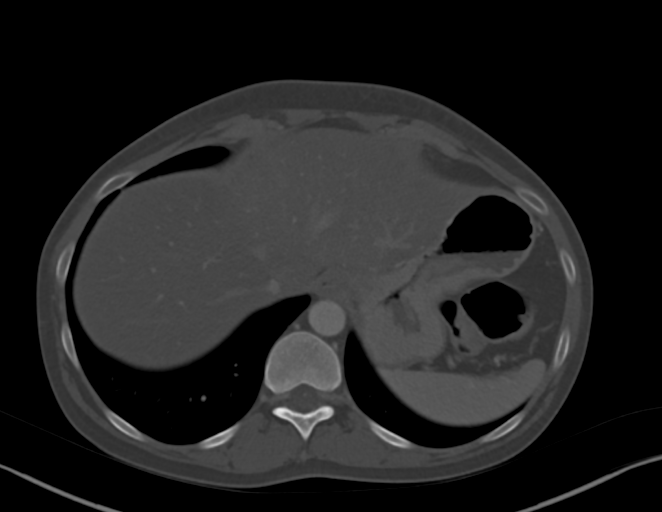
[im 85/93  soft-tissue]
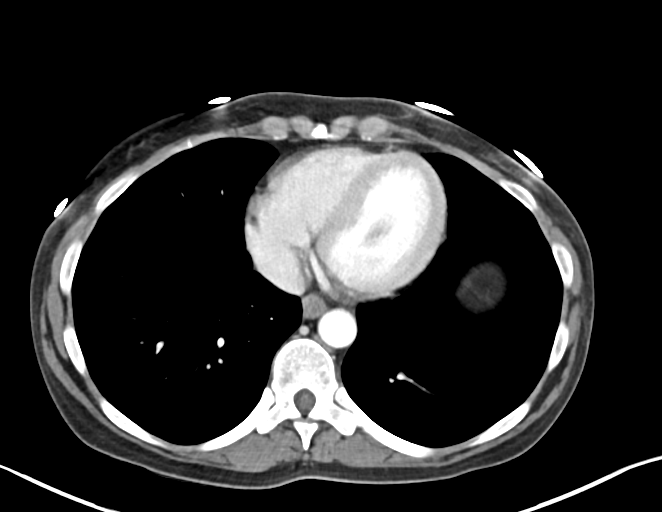

[Series 6: abd pelvis 2.00 br40 s3 cor · coronal · 0.71mm/px · 3 of 124 slices shown]
[im 42/124  soft-tissue]
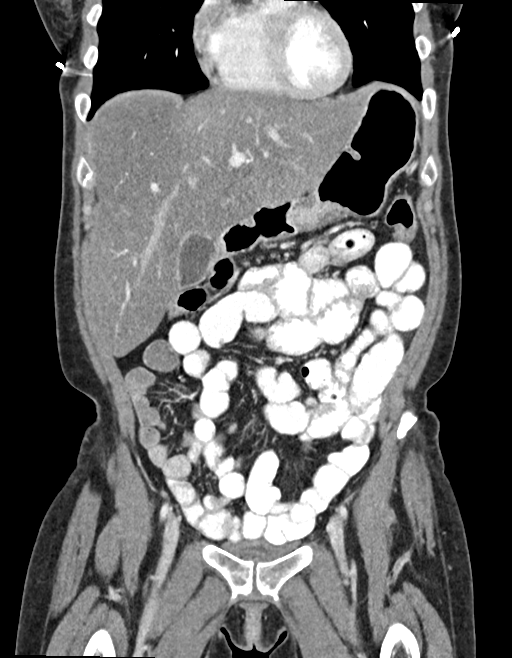
[im 55/124  soft-tissue]
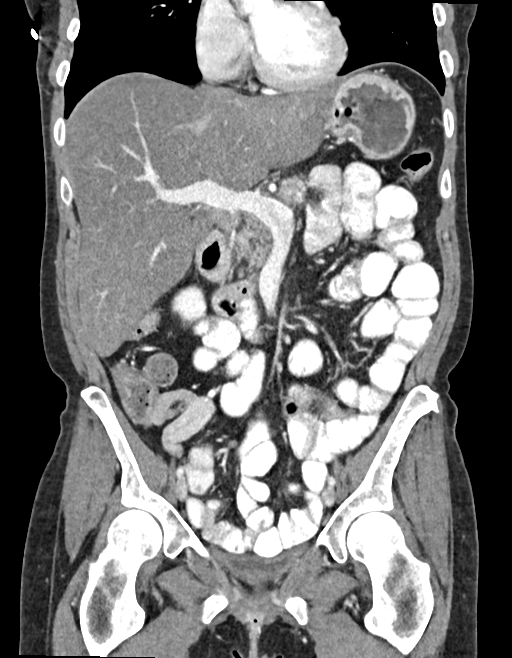
[im 69/124  soft-tissue]
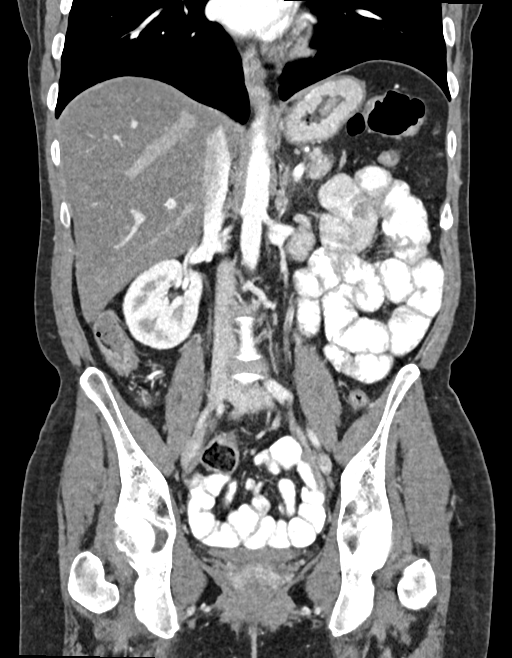

[13 of 46 positions shown; findings below may reference images not displayed]

FINDINGS: Lower Chest: No acute findings.

Hepatobiliary: No hepatic masses identified. Moderate diffuse
hepatic steatosis is increased since previous study. Gallbladder is
unremarkable. No evidence of biliary ductal dilatation.

Pancreas: No mass or inflammatory changes. No evidence of pancreatic
calcifications or peripancreatic fluid collections.

Spleen: Within normal limits in size and appearance.

Adrenals/Urinary Tract: No masses identified. No evidence of
ureteral calculi or hydronephrosis.

Stomach/Bowel: No evidence of obstruction, inflammatory process or
abnormal fluid collections. Normal appendix visualized.

Vascular/Lymphatic: No pathologically enlarged lymph nodes. No
abdominal aortic aneurysm.

Reproductive:  No mass or other significant abnormality.

Other:  None.

Musculoskeletal:  No suspicious bone lesions identified.
IMPRESSION: 1. No radiographic evidence of pancreatitis, neoplasm, or other
acute findings.
2. Moderate hepatic steatosis.

## 2020-05-28 MED ORDER — IOPAMIDOL (ISOVUE-300) INJECTION 61%
100.0000 mL | Freq: Once | INTRAVENOUS | Status: AC | PRN
  Administered 2020-05-28: 100 mL via INTRAVENOUS

## 2020-05-28 MED ORDER — MEDROXYPROGESTERONE ACETATE 150 MG/ML IM SUSP
150.0000 mg | Freq: Once | INTRAMUSCULAR | Status: AC
  Administered 2020-05-28: 150 mg via INTRAMUSCULAR

## 2020-05-28 NOTE — Progress Notes (Signed)
   NURSE VISIT- INJECTION  SUBJECTIVE:  Samantha Rosales is a 39 y.o. [phone] female here for a Depo Provera for contraception/period management. She is a GYN patient.   OBJECTIVE:  There were no vitals taken for this visit.  Appears well, in no apparent distress  Injection administered in: Right deltoid  Meds ordered this encounter  Medications  . medroxyPROGESTERone (DEPO-PROVERA) injection 150 mg    ASSESSMENT: GYN patient Depo Provera for contraception/period management PLAN: Follow-up: in 11-13 weeks for next Depo   Stoney Bang  05/28/2020 10:55 AM

## 2020-06-02 ENCOUNTER — Telehealth: Payer: Self-pay | Admitting: Family Medicine

## 2020-06-02 NOTE — Telephone Encounter (Signed)
CB# [phone] Call for lab results

## 2020-06-03 ENCOUNTER — Telehealth: Payer: Self-pay | Admitting: Family Medicine

## 2020-06-03 NOTE — Telephone Encounter (Signed)
ZY#[phone] Call for Ultrasound results

## 2020-06-08 NOTE — Telephone Encounter (Signed)
Spoke with Pt about ultrasound results on 06/04/20

## 2020-06-08 NOTE — Telephone Encounter (Signed)
Spoke with Pt with results for Labs

## 2020-06-10 ENCOUNTER — Encounter (HOSPITAL_COMMUNITY): Payer: Self-pay | Admitting: Emergency Medicine

## 2020-06-10 ENCOUNTER — Emergency Department (HOSPITAL_COMMUNITY): Payer: BC Managed Care – PPO

## 2020-06-10 ENCOUNTER — Emergency Department (HOSPITAL_COMMUNITY)
Admission: EM | Admit: 2020-06-10 | Discharge: 2020-06-10 | Disposition: A | Discharge status: Home / Self Care | Payer: BC Managed Care – PPO | Attending: Emergency Medicine | Admitting: Emergency Medicine

## 2020-06-10 ENCOUNTER — Other Ambulatory Visit: Payer: Self-pay

## 2020-06-10 DIAGNOSIS — F419 Anxiety disorder, unspecified: Secondary | ICD-10-CM | POA: Diagnosis present

## 2020-06-10 DIAGNOSIS — Z880 Allergy status to penicillin: Secondary | ICD-10-CM | POA: Insufficient documentation

## 2020-06-10 DIAGNOSIS — I10 Essential (primary) hypertension: Secondary | ICD-10-CM | POA: Insufficient documentation

## 2020-06-10 DIAGNOSIS — F17210 Nicotine dependence, cigarettes, uncomplicated: Secondary | ICD-10-CM | POA: Diagnosis not present

## 2020-06-10 DIAGNOSIS — Z79899 Other long term (current) drug therapy: Secondary | ICD-10-CM | POA: Diagnosis not present

## 2020-06-10 DIAGNOSIS — E039 Hypothyroidism, unspecified: Secondary | ICD-10-CM | POA: Diagnosis not present

## 2020-06-10 DIAGNOSIS — R4781 Slurred speech: Secondary | ICD-10-CM | POA: Insufficient documentation

## 2020-06-10 IMAGING — CT CT HEAD W/O CM
3 series · 16 of 47 positions shown, 19 images · non-contrast
Comparison: CT brain [DATE]

CLINICAL DATA: Slurred speech

EXAM:
CT HEAD WITHOUT CONTRAST
TECHNIQUE: Contiguous axial images were obtained from the base of the skull
through the vertex without intravenous contrast.

[Series 2: head wo · axial · 0.42mm/px · z∈[+1443,+1578]mm · 10 of 33 slices shown, 13 images]
[im 3/33  brain]
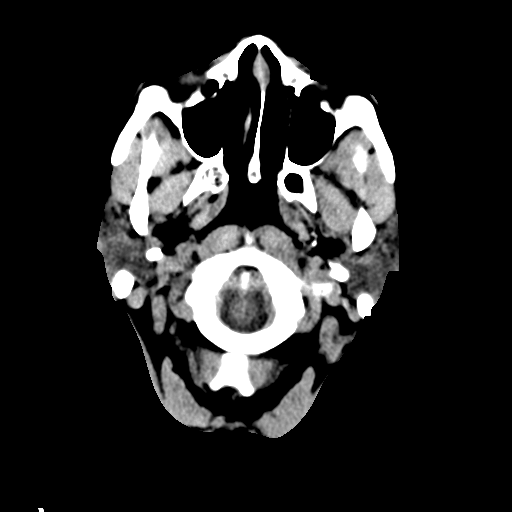
[im 3/33  bone]
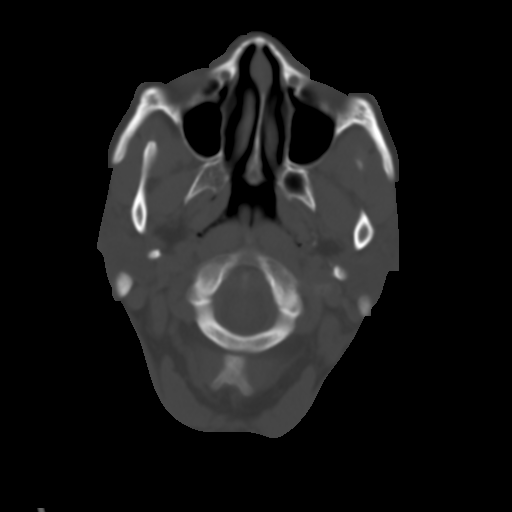
[im 6/33  brain]
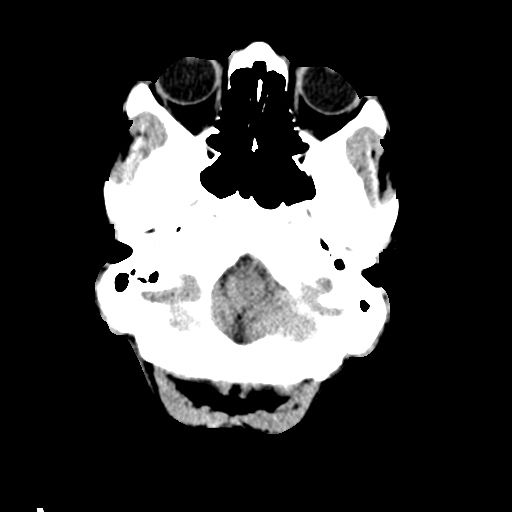
[im 9/33  brain]
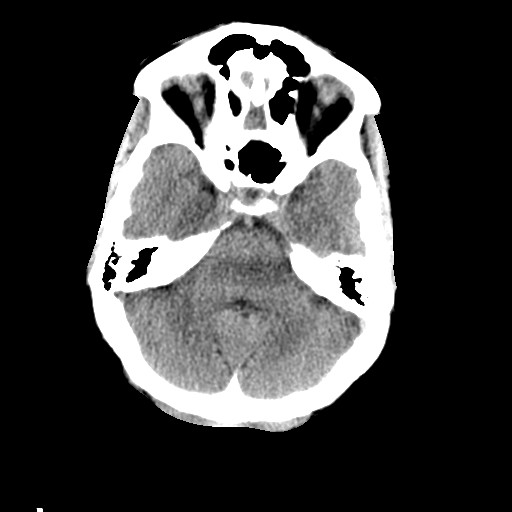
[im 12/33  brain]
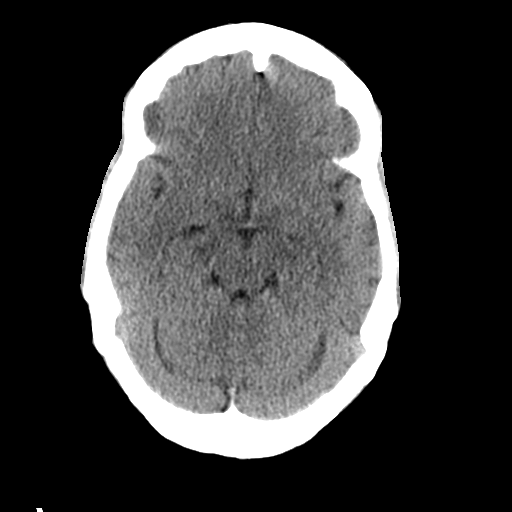
[im 15/33  brain]
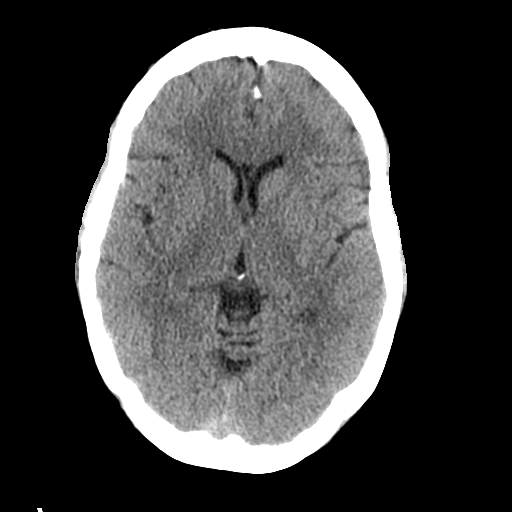
[im 15/33  bone]
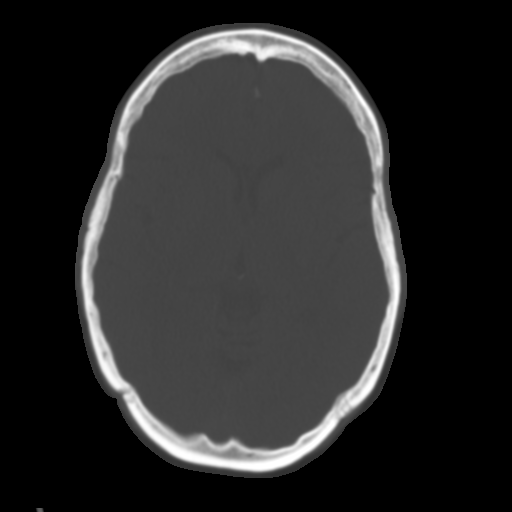
[im 18/33  brain]
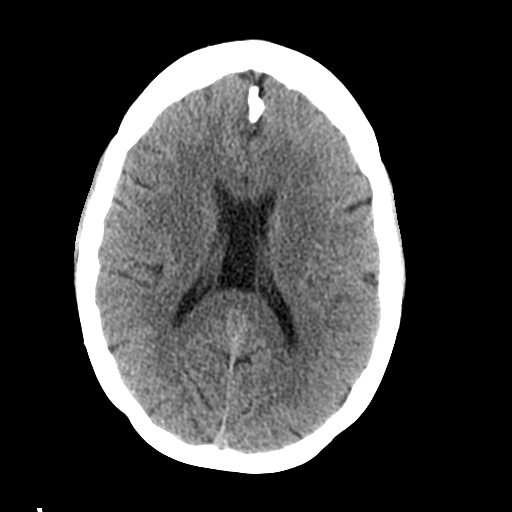
[im 21/33  brain]
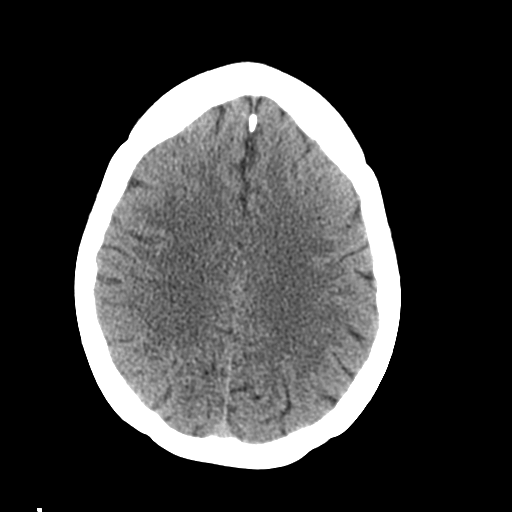
[im 25/33  brain]
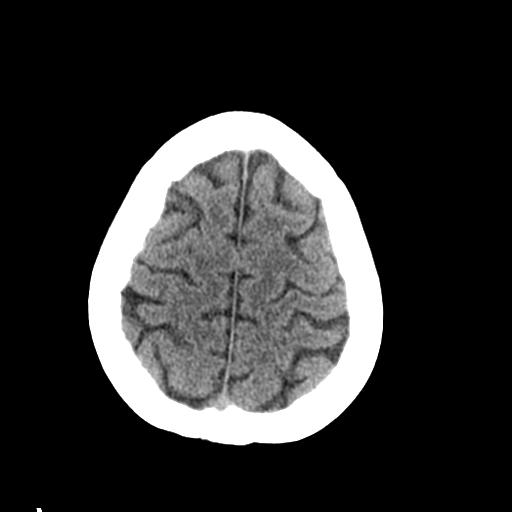
[im 27/33  brain]
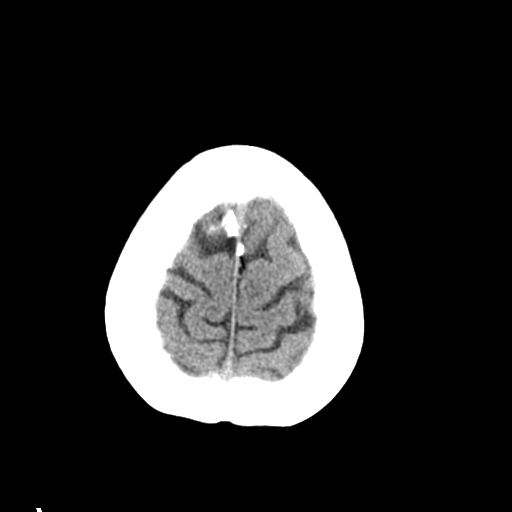
[im 27/33  bone]
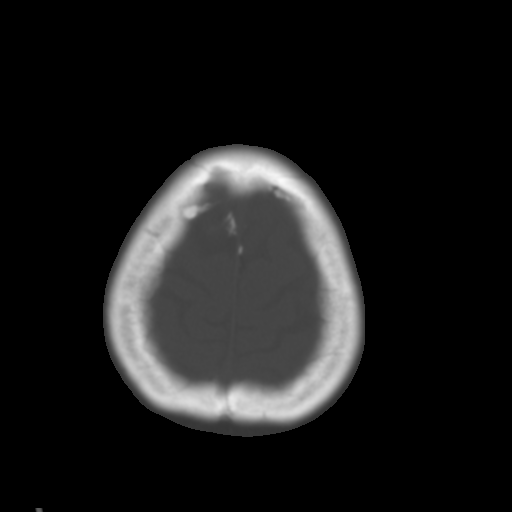
[im 30/33  brain]
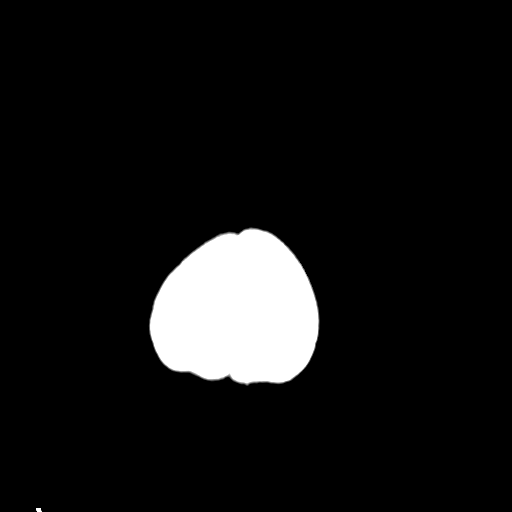

[Series 4: coronal soft tissue · coronal · 0.29mm/px · 3 of 64 slices shown]
[im 22/64  brain]
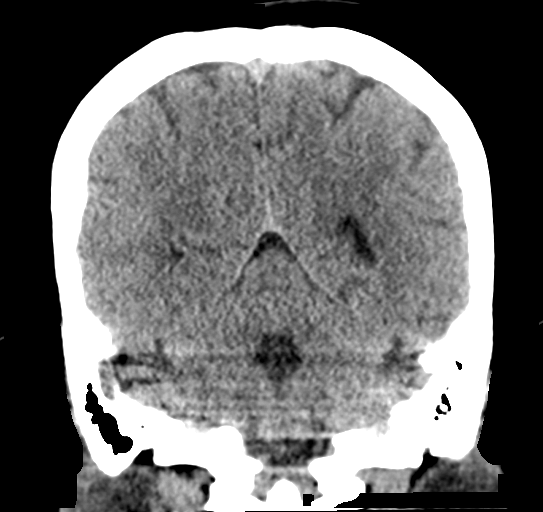
[im 29/64  brain]
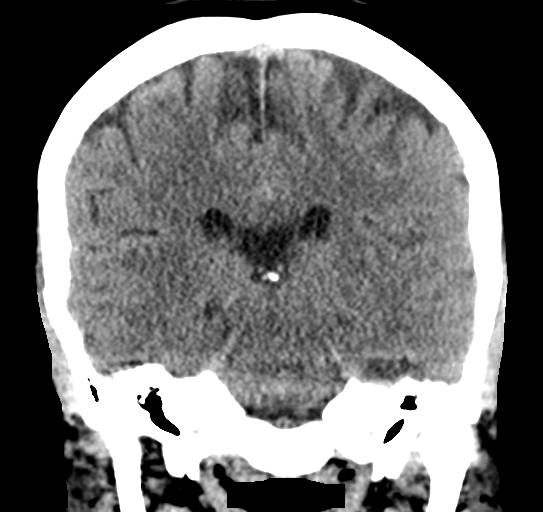
[im 36/64  brain]
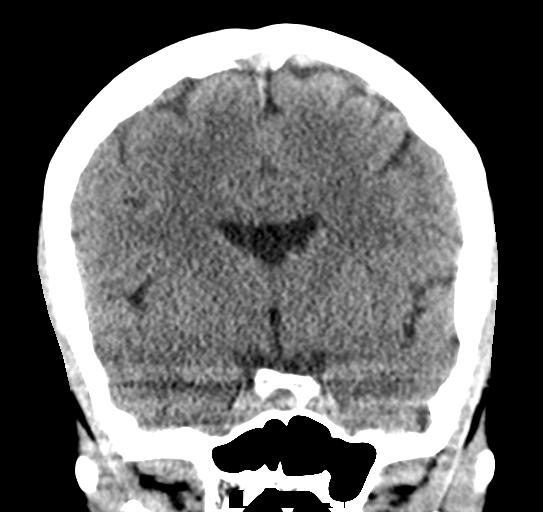

[Series 5: sagittal soft tissue · sagittal · 0.29mm/px · 3 of 54 slices shown]
[im 18/54  brain]
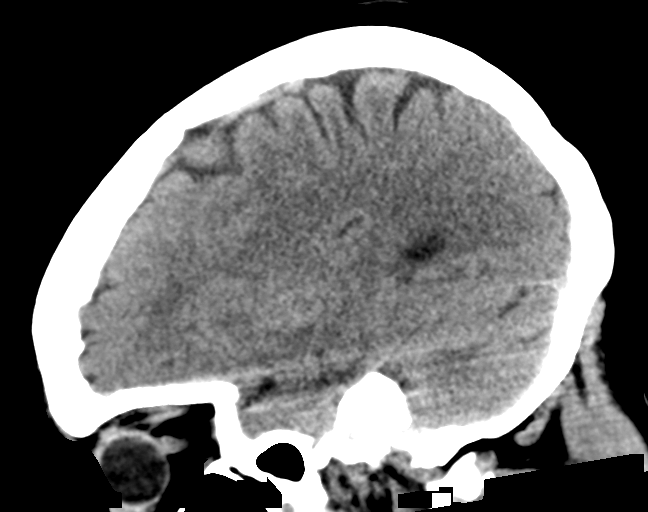
[im 27/54  brain]
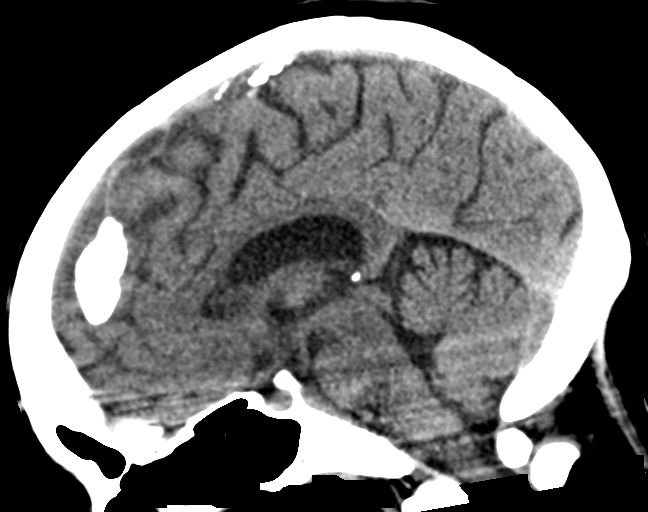
[im 36/54  brain]
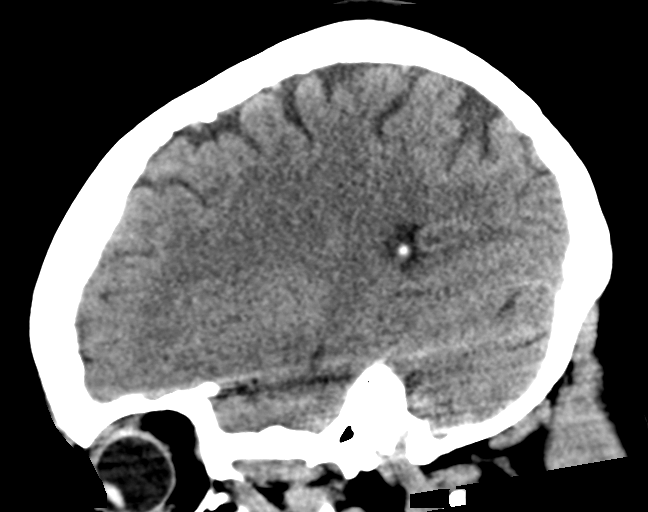

[16 of 47 positions shown; findings below may reference images not displayed]

FINDINGS: Brain: No evidence of acute infarction, hemorrhage, hydrocephalus,
extra-axial collection or mass lesion/mass effect.

Vascular: No hyperdense vessel or unexpected calcification.

Skull: Normal. Negative for fracture or focal lesion.

Sinuses/Orbits: No acute finding.

Other: None.
IMPRESSION: Negative non contrasted CT appearance of the brain.

## 2020-06-10 NOTE — ED Triage Notes (Signed)
Patient reports slurred speech and cramping in hands since anxiety attack this morning. Hx anxiety. Denies chest pains and headache.

## 2020-06-10 NOTE — ED Notes (Signed)
Signature pad not capturing signature; pt verbalized understanding of discharge instructions.

## 2020-06-10 NOTE — ED Provider Notes (Signed)
[facility]-EMERGENCY DEPT Provider Note   CSN: [id number] Arrival date & time: 06/10/20  2035     History Chief Complaint  Patient presents with  . Aphasia  . Anxiety    Samantha Rosales is a 39 y.o. female.  39 year old female who presents with increased anxiety.  Denies any SI or HI.  States that she has some hand cramping and slurred speech.  She states that this occurred when she became more anxious.  Denies any headache.  No emesis.  Has had some insomnia.  Was recently started back on her Lexapro today        Past Medical History:  Diagnosis Date  . Abnormal Pap smear   . Alcohol use   . Allergic rhinitis   . BV (bacterial vaginosis)   . GERD (gastroesophageal reflux disease)   . Hepatitis    LFT's >1000 (2019) due to methotrexate vs alcohol  . High cholesterol   . HSV-2 (herpes simplex virus 2) infection   . Hypertension   . Pancreatitis    12/19 (thought to be due to alcohol)  . Rheumatoid arthritis (HCC)   . Seizures (HCC)    dx as child with epilepsy but no seizures since 89 and on no meds  . Vaginal Pap smear, abnormal     Patient Active Problem List   Diagnosis Date Noted  . Pancreatitis   . Hypertensive urgency 10/17/2019  . Hypokalemia 10/17/2019  . Hypomagnesemia 10/17/2019  . Alcohol abuse 10/17/2019  . Tobacco abuse 10/17/2019  . Hypothyroidism 10/17/2019  . Alcohol withdrawal (HCC) 10/17/2019  . Lactic acidosis 10/17/2019  . Confusion   . Abnormal LFTs 09/05/2018  . Elevated lipase 09/05/2018  . History of methotrexate therapy 09/05/2018  . High triglycerides 09/05/2018  . Elevated liver enzymes 07/01/2018  . Essential hypertension 07/01/2018  . Encounter for well woman exam with routine gynecological exam 07/01/2018  . History of abnormal cervical Pap smear 06/26/2017  . Encounter for surveillance of injectable contraceptive 06/26/2017  . Encounter for gynecological examination with Papanicolaou smear of cervix  06/26/2017  . Arthritis, rheumatic, acute or subacute 11/18/2013  . Urticaria, idiopathic 08/24/2013  . HSV-2 (herpes simplex virus 2) infection 05/29/2013  . HPV (human papilloma virus) infection 05/29/2013    Past Surgical History:  Procedure Laterality Date  . CERVICAL CONIZATION W/BX  12/20/2012   Procedure: CONIZATION CERVIX WITH BIOPSY;  Surgeon: Lazaro Arms, MD;  Location: [facility];  Service: Gynecology;  Laterality: N/A;  . HOLMIUM LASER APPLICATION  12/20/2012   Procedure: HOLMIUM LASER APPLICATION;  Surgeon: Lazaro Arms, MD;  Location: [facility];  Service: Gynecology;  Laterality: N/A;     OB History    Gravida  4   Para  2   Term  2   Preterm      AB  2   Living  2     SAB  1   TAB  1   Ectopic      Multiple      Live Births  2           Family History  Problem Relation Age of Onset  . Hypertension Mother   . Hypertension Father   . Pancreatic cancer Paternal Uncle   . Colon cancer Neg Hx   . Esophageal cancer Neg Hx   . Inflammatory bowel disease Neg Hx   . Liver disease Neg Hx   . Rectal cancer Neg Hx   . Stomach cancer  Neg Hx     Social History   Tobacco Use  . Smoking status: Current Every Day Smoker    Packs/day: 0.50    Years: 15.00    Pack years: 7.50    Types: Cigarettes  . Smokeless tobacco: Never Used  Vaping Use  . Vaping Use: Never used  Substance Use Topics  . Alcohol use: Yes    Comment: on the weekends drinks with friends  . Drug use: No    Home Medications Prior to Admission medications   Medication Sig Start Date End Date Taking? Authorizing Provider  Biotin 1000 MCG tablet Take 1,000 mcg by mouth daily.   Yes [provider]  Cholecalciferol (VITAMIN D-3) 1000 UNITS CAPS Take 1 capsule by mouth daily.   Yes [provider]  escitalopram (LEXAPRO) 10 MG tablet TAKE 1 TABLET BY MOUTH EVERY DAY 09/18/19  Yes Pickard, Priscille Heidelberg, MD  HUMIRA PEN 40 MG/0.4ML PNKT Inject 40 mg into the skin every 14  (fourteen) days.  05/28/20  Yes [provider]  medroxyPROGESTERone (DEPO-PROVERA) 150 MG/ML injection INJECT 1 ML INTRAMUSCULARLY IN DOCTOR'S OFFICE EVERY 3 MONTHS. Patient taking differently: Inject 150 mg into the muscle every 3 (three) months. INJECT 1 ML INTRAMUSCULARLY IN DOCTOR'S OFFICE EVERY 3 MONTHS. 12/18/19  Yes Cyril Mourning A, NP  nebivolol (BYSTOLIC) 10 MG tablet Take 1 tablet (10 mg total) by mouth daily. 04/15/20  Yes Donita Brooks, MD  fenofibrate micronized (LOFIBRA) 200 MG capsule Take 1 capsule (200 mg total) by mouth at bedtime. Patient not taking: Reported on 04/15/2020 06/10/19   Danelle Berry, PA-C  fluticasone [facility]) 50 MCG/ACT nasal spray USE TWO SPRAY(S) IN EACH NOSTRIL ONCE DAILY Patient not taking: Reported on 04/15/2020 06/21/17   Donita Brooks, MD  folic acid (FOLVITE) 1 MG tablet Take 1 mg by mouth daily. Patient not taking: Reported on 06/10/2020    [provider]  HUMIRA PEN 40 MG/0.8ML PNKT Inject 1 Dose into the skin every 14 (fourteen) days.  Patient not taking: Reported on 06/10/2020    [provider]  Icosapent Ethyl (VASCEPA) 1 g CAPS Take 2 capsules (2 g total) by mouth 2 (two) times daily. Patient not taking: Reported on 04/15/2020 09/05/18   Donita Brooks, MD  losartan (COZAAR) 50 MG tablet TAKE 1 TABLET BY MOUTH ONCE A DAY Patient not taking: Reported on 04/15/2020 03/04/20   Donita Brooks, MD  pantoprazole (PROTONIX) 40 MG tablet TAKE 1 TABLET BY MOUTH TWICE A DAY Patient not taking: Reported on 04/15/2020 12/31/19   Mansouraty, Netty Starring., MD  potassium chloride SA (KLOR-CON) 20 MEQ tablet Take 1 tablet (20 mEq total) by mouth daily for 5 days. 04/16/20 04/21/20  Donita Brooks, MD  silver sulfADIAZINE (SILVADENE) 1 % cream Apply 1 application topically 2 (two) times daily. Patient not taking: Reported on 04/15/2020 01/05/20   Moshe Cipro, NP  tiZANidine (ZANAFLEX) 4 MG tablet Take 1 tablet (4 mg total) by  mouth every 6 (six) hours as needed for muscle spasms. Patient not taking: Reported on 04/15/2020 11/14/19   Donita Brooks, MD  SUMAtriptan (IMITREX) 50 MG tablet Take 1 tablet (50 mg total) by mouth every 2 (two) hours as needed for migraine. May repeat in 2 hours if headache persists or recurs. 10/25/18 01/25/20  Donita Brooks, MD    Allergies    Penicillins  Review of Systems   Review of Systems  All other systems reviewed and are negative.  Physical Exam Updated Vital Signs BP (!) 185/118 (BP Location: Right Arm)   Pulse 75   Temp 98.4 F (36.9 C) (Oral)   Resp 18   SpO2 100%   Physical Exam Vitals and nursing note reviewed.  Constitutional:      General: She is not in acute distress.    Appearance: Normal appearance. She is well-developed. She is not toxic-appearing.  HENT:     Head: Normocephalic and atraumatic.  Eyes:     General: Lids are normal.     Conjunctiva/sclera: Conjunctivae normal.     Pupils: Pupils are equal, round, and reactive to light.  Neck:     Thyroid: No thyroid mass.     Trachea: No tracheal deviation.  Cardiovascular:     Rate and Rhythm: Normal rate and regular rhythm.     Heart sounds: Normal heart sounds. No murmur heard.  No gallop.   Pulmonary:     Effort: Pulmonary effort is normal. No respiratory distress.     Breath sounds: Normal breath sounds. No stridor. No decreased breath sounds, wheezing, rhonchi or rales.  Abdominal:     General: Bowel sounds are normal. There is no distension.     Palpations: Abdomen is soft.     Tenderness: There is no abdominal tenderness. There is no rebound.  Musculoskeletal:        General: No tenderness. Normal range of motion.     Cervical back: Normal range of motion and neck supple.  Skin:    General: Skin is warm and dry.     Findings: No abrasion or rash.  Neurological:     Mental Status: She is alert and oriented to person, place, and time.     GCS: GCS eye subscore is 4. GCS verbal  subscore is 5. GCS motor subscore is 6.     Cranial Nerves: No cranial nerve deficit.     Sensory: No sensory deficit.  Psychiatric:        Attention and Perception: Attention normal.        Mood and Affect: Mood is anxious.        Speech: Speech normal.        Behavior: Behavior normal.     ED Results / Procedures / Treatments   Labs (all labs ordered are listed, but only abnormal results are displayed) Labs Reviewed - No data to display  EKG None  Radiology CT HEAD WO CONTRAST  Result Date: 06/10/2020 CLINICAL DATA:  Slurred speech EXAM: CT HEAD WITHOUT CONTRAST TECHNIQUE: Contiguous axial images were obtained from the base of the skull through the vertex without intravenous contrast. COMPARISON:  CT brain 10/16/2019 FINDINGS: Brain: No evidence of acute infarction, hemorrhage, hydrocephalus, extra-axial collection or mass lesion/mass effect. Vascular: No hyperdense vessel or unexpected calcification. Skull: Normal. Negative for fracture or focal lesion. Sinuses/Orbits: No acute finding. Other: None. IMPRESSION: Negative non contrasted CT appearance of the brain. Electronically Signed   By: Jasmine Pang M.D.   On: 06/10/2020 21:28    Procedures Procedures (including critical care time)  Medications Ordered in ED Medications - No data to display  ED Course  I have reviewed the triage vital signs and the nursing notes.  Pertinent labs & imaging results that were available during my care of the patient were reviewed by me and considered in my medical decision making (see chart for details).    MDM Rules/Calculators/A&P  Head CT is negative here.  Symptoms likely from anxiety.  Have encouraged her to follow-up with her doctor. Final Clinical Impression(s) / ED Diagnoses Final diagnoses:  None    Rx / DC Orders ED Discharge Orders    None       Lorre Nick, MD 06/10/20 2235

## 2020-06-10 NOTE — Telephone Encounter (Signed)
Pt said she's woke up for two days in a row with seats, body trembling, more of anxiety, she is on Lexapro I instructed her to take her Lexapro and to see how she feels after that, to call us back and let us know how she's feeling.

## 2020-08-12 ENCOUNTER — Other Ambulatory Visit: Payer: Self-pay

## 2020-08-12 ENCOUNTER — Ambulatory Visit
Admission: EM | Admit: 2020-08-12 | Discharge: 2020-08-12 | Disposition: A | Discharge status: Home / Self Care | Payer: BC Managed Care – PPO | Attending: Emergency Medicine | Admitting: Emergency Medicine

## 2020-08-12 DIAGNOSIS — Z1152 Encounter for screening for COVID-19: Secondary | ICD-10-CM | POA: Diagnosis not present

## 2020-08-14 LAB — NOVEL CORONAVIRUS, NAA: SARS-CoV-2, NAA: NOT DETECTED

## 2020-08-14 LAB — SARS-COV-2, NAA 2 DAY TAT

## 2020-08-20 ENCOUNTER — Ambulatory Visit: Payer: BC Managed Care – PPO

## 2020-11-03 DIAGNOSIS — Z419 Encounter for procedure for purposes other than remedying health state, unspecified: Secondary | ICD-10-CM | POA: Diagnosis not present

## 2020-11-05 ENCOUNTER — Other Ambulatory Visit: Payer: BC Managed Care – PPO

## 2020-11-22 ENCOUNTER — Ambulatory Visit: Payer: BC Managed Care – PPO

## 2020-11-29 ENCOUNTER — Ambulatory Visit: Payer: BC Managed Care – PPO

## 2020-12-02 ENCOUNTER — Other Ambulatory Visit: Payer: BC Managed Care – PPO

## 2020-12-02 ENCOUNTER — Other Ambulatory Visit: Payer: Self-pay

## 2020-12-02 DIAGNOSIS — E876 Hypokalemia: Secondary | ICD-10-CM

## 2020-12-03 LAB — BASIC METABOLIC PANEL
BUN/Creatinine Ratio: 6 (calc) (ref 6–22)
BUN: 4 mg/dL — ABNORMAL LOW (ref 7–25)
CO2: 33 mmol/L — ABNORMAL HIGH (ref 20–32)
Calcium: 9 mg/dL (ref 8.6–10.2)
Chloride: 88 mmol/L — ABNORMAL LOW (ref 98–110)
Creat: 0.7 mg/dL (ref 0.50–1.10)
Glucose, Bld: 100 mg/dL — ABNORMAL HIGH (ref 65–99)
Potassium: 3 mmol/L — ABNORMAL LOW (ref 3.5–5.3)
Sodium: 137 mmol/L (ref 135–146)

## 2020-12-04 DIAGNOSIS — Z419 Encounter for procedure for purposes other than remedying health state, unspecified: Secondary | ICD-10-CM | POA: Diagnosis not present

## 2020-12-04 HISTORY — PX: FRACTURE SURGERY: SHX138

## 2020-12-07 ENCOUNTER — Ambulatory Visit: Payer: Self-pay | Admitting: Family Medicine

## 2020-12-08 ENCOUNTER — Other Ambulatory Visit: Payer: BC Managed Care – PPO

## 2020-12-13 ENCOUNTER — Telehealth (INDEPENDENT_AMBULATORY_CARE_PROVIDER_SITE_OTHER): Payer: BC Managed Care – PPO | Admitting: Family Medicine

## 2020-12-13 ENCOUNTER — Other Ambulatory Visit: Payer: Self-pay

## 2020-12-13 DIAGNOSIS — H538 Other visual disturbances: Secondary | ICD-10-CM

## 2020-12-13 DIAGNOSIS — I10 Essential (primary) hypertension: Secondary | ICD-10-CM | POA: Diagnosis not present

## 2020-12-13 DIAGNOSIS — E876 Hypokalemia: Secondary | ICD-10-CM

## 2020-12-13 MED ORDER — SPIRONOLACTONE 25 MG PO TABS: 25.0000 mg | ORAL_TABLET | Freq: Every day | ORAL | 3 refills | Status: DC

## 2020-12-13 NOTE — Progress Notes (Signed)
Subjective:    Patient ID: Samantha Rosales, female    DOB: 20-Jan-1981, 40 y.o.   MRN: [id number]  HPI  Patient is a very pleasant 40 year old African-American female who is being seen today as a telephone visit.  She is currently in her home.  I am currently at my office.  Consents to be seen via telephone.  Phone call began at 815.  Phone call concluded today 30.  She has a history of hypertension.  Recently she has been having decreased visual acuity.  She describes it as blurry vision.  She reports a difficult time adjusting when looking at different distances.  She denies any double vision.  She denies any redness in the eyes.  She denies any pain in the eyes.  She has no history of glaucoma.  She does have a history of rheumatoid arthritis for which she takes Humira.  She has no history of uveitis.  Recently she had her blood work checked because she was concerned that she may be a diabetic.  Her blood sugar was 100 however her potassium was low at 3.0.  She does report frequent muscle cramps.  She is on Bystolic for hypertension.  Her blood pressures been 120/80.  She has not been taking her potassium supplement. Past Medical History:  Diagnosis Date  . Abnormal Pap smear   . Alcohol use   . Allergic rhinitis   . BV (bacterial vaginosis)   . GERD (gastroesophageal reflux disease)   . Hepatitis    LFT's >1000 (2019) due to methotrexate vs alcohol  . High cholesterol   . HSV-2 (herpes simplex virus 2) infection   . Hypertension   . Pancreatitis    12/19 (thought to be due to alcohol)  . Rheumatoid arthritis (HCC)   . Seizures (HCC)    dx as child with epilepsy but no seizures since 89 and on no meds  . Vaginal Pap smear, abnormal    Past Surgical History:  Procedure Laterality Date  . CERVICAL CONIZATION W/BX  12/20/2012   Procedure: CONIZATION CERVIX WITH BIOPSY;  Surgeon: Lazaro Arms, MD;  Location: [facility];  Service: Gynecology;  Laterality: N/A;  . HOLMIUM LASER APPLICATION   12/20/2012   Procedure: HOLMIUM LASER APPLICATION;  Surgeon: Lazaro Arms, MD;  Location: [facility];  Service: Gynecology;  Laterality: N/A;   Current Outpatient Medications on File Prior to Visit  Medication Sig Dispense Refill  . escitalopram (LEXAPRO) 10 MG tablet TAKE 1 TABLET BY MOUTH EVERY DAY 90 tablet 3  . fenofibrate micronized (LOFIBRA) 200 MG capsule Take 1 capsule (200 mg total) by mouth at bedtime. 90 capsule 3  . folic acid (FOLVITE) 1 MG tablet Take 1 mg by mouth daily.    Marland Kitchen HUMIRA PEN 40 MG/0.4ML PNKT Inject 40 mg into the skin every 14 (fourteen) days.     Bess Harvest Ethyl (VASCEPA) 1 g CAPS Take 2 capsules (2 g total) by mouth 2 (two) times daily. 120 capsule 11  . medroxyPROGESTERone (DEPO-PROVERA) 150 MG/ML injection INJECT 1 ML INTRAMUSCULARLY IN DOCTOR'S OFFICE EVERY 3 MONTHS. (Patient taking differently: Inject 150 mg into the muscle every 3 (three) months. INJECT 1 ML INTRAMUSCULARLY IN DOCTOR'S OFFICE EVERY 3 MONTHS.) 1 mL 4  . nebivolol (BYSTOLIC) 10 MG tablet Take 1 tablet (10 mg total) by mouth daily. 90 tablet 3  . pantoprazole (PROTONIX) 40 MG tablet TAKE 1 TABLET BY MOUTH TWICE A DAY 60 tablet 0  . Biotin 1000 MCG  tablet Take 1,000 mcg by mouth daily.    . Cholecalciferol (VITAMIN D-3) 1000 UNITS CAPS Take 1 capsule by mouth daily.    . fluticasone (FLONASE) 50 MCG/ACT nasal spray USE TWO SPRAY(S) IN EACH NOSTRIL ONCE DAILY (Patient not taking: Reported on 04/15/2020) 16 g 11  . [DISCONTINUED] SUMAtriptan (IMITREX) 50 MG tablet Take 1 tablet (50 mg total) by mouth every 2 (two) hours as needed for migraine. May repeat in 2 hours if headache persists or recurs. 10 tablet 3   No current facility-administered medications on file prior to visit.   Allergies  Allergen Reactions  . Penicillins     Has patient had a PCN reaction causing immediate rash, facial/tongue/throat swelling, SOB or lightheadedness with hypotension: Unknown Has patient had a PCN reaction causing  severe rash involving mucus membranes or skin necrosis: Unknown Has patient had a PCN reaction that required hospitalization: Unknown Has patient had a PCN reaction occurring within the last 10 years: No If all of the above answers are "NO", then may proceed with Cephalosporin use.    Social History   Socioeconomic History  . Marital status: Married    Spouse name: Not on file  . Number of children: 2  . Years of education: Not on file  . Highest education level: Not on file  Occupational History  . 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 Smoking status: Current Every Day Smoker    Packs/day: 0.50    Years: 15.00    Pack years: 7.50    Types: Cigarettes  . Smokeless tobacco: Never Used  Vaping Use  . Vaping Use: Never used  Substance and Sexual Activity  . Alcohol use: Yes    Comment: on the weekends drinks with friends  . Drug use: No  . Sexual activity: Yes    Birth control/protection: Injection    Comment: Depo Provera every 3 months  Other Topics Concern  . Not on file  Social History Narrative  . Not on file   Social Determinants of Health   Financial Resource Strain: Not on file  Food Insecurity: Not on file  Transportation Needs: Not on file  Physical Activity: Not on file  Stress: Not on file  Social Connections: Not on file  Intimate Partner Violence: Not on file     Review of Systems  All other systems reviewed and are negative.      Objective:   Physical Exam        Assessment & Plan:  Benign essential HTN  Low blood potassium  Blurry vision, bilateral  Patient has an appointment to see her ophthalmologist this week.  I encouraged her to keep that to evaluate for any uveitis or iritis due to autoimmune disease.  I am concerned that her potassium being so low could cause muscle problems potentially even for visual accommodation.  Therefore I would like her to continue the Bystolic but we  will start her on spironolactone 25 mg a day.  Recheck potassium in 1 week and also check hemoglobin A1c.

## 2020-12-16 DIAGNOSIS — Z1152 Encounter for screening for COVID-19: Secondary | ICD-10-CM | POA: Diagnosis not present

## 2020-12-23 ENCOUNTER — Telehealth: Payer: Self-pay

## 2020-12-23 NOTE — Telephone Encounter (Signed)
Pt wanted to advised that she had a virtual with eye doctor today and thy are sending her some where else. Pt was supposed to have labs this week but do to new referral she will schedule a lab visit after her new referral to another eye doctor. Just FYI please advise if pt needs to be contacted. KH

## 2021-01-04 DIAGNOSIS — Z419 Encounter for procedure for purposes other than remedying health state, unspecified: Secondary | ICD-10-CM | POA: Diagnosis not present

## 2021-01-14 ENCOUNTER — Telehealth: Payer: Self-pay

## 2021-01-14 NOTE — Telephone Encounter (Signed)
Pt called to report that she is having numbness in the toes and burning skin on her back. Called pt, lm for her to call bk. Pt also says she has faxed over records from eye visit

## 2021-01-17 ENCOUNTER — Other Ambulatory Visit: Payer: Self-pay | Admitting: Family Medicine

## 2021-01-27 ENCOUNTER — Other Ambulatory Visit: Payer: Self-pay

## 2021-01-27 ENCOUNTER — Other Ambulatory Visit: Payer: BC Managed Care – PPO

## 2021-01-27 ENCOUNTER — Ambulatory Visit (INDEPENDENT_AMBULATORY_CARE_PROVIDER_SITE_OTHER): Payer: BC Managed Care – PPO | Admitting: *Deleted

## 2021-01-27 DIAGNOSIS — E559 Vitamin D deficiency, unspecified: Secondary | ICD-10-CM

## 2021-01-27 DIAGNOSIS — Z308 Encounter for other contraceptive management: Secondary | ICD-10-CM | POA: Diagnosis not present

## 2021-01-27 DIAGNOSIS — Z3042 Encounter for surveillance of injectable contraceptive: Secondary | ICD-10-CM

## 2021-01-27 DIAGNOSIS — R6889 Other general symptoms and signs: Secondary | ICD-10-CM

## 2021-01-27 LAB — BETA HCG QUANT (REF LAB): hCG Quant: <1 m[IU]/mL

## 2021-01-27 MED ORDER — MEDROXYPROGESTERONE ACETATE 150 MG/ML IM SUSP
150.0000 mg | Freq: Once | INTRAMUSCULAR | Status: AC
  Administered 2021-01-27: 150 mg via INTRAMUSCULAR

## 2021-01-27 NOTE — Progress Notes (Signed)
   NURSE VISIT- INJECTION  SUBJECTIVE:  Samantha Rosales is a 40 y.o. [phone] female here for a Depo Provera for contraception/period management. She is a GYN patient.   OBJECTIVE:  There were no vitals taken for this visit.  Appears well, in no apparent distress  Injection administered in: Left deltoid  Meds ordered this encounter  Medications  . medroxyPROGESTERone (DEPO-PROVERA) injection 150 mg    ASSESSMENT: GYN patient Depo Provera for contraception/period management PLAN: Follow-up: in 11-13 weeks for next Depo   Malachy Mood  01/27/2021 2:06 PM

## 2021-01-28 LAB — LIPID PANEL
Cholesterol: 132 mg/dL (ref <200)
HDL: 62 mg/dL (ref >=50)
LDL Cholesterol (Calc): 52 mg/dL (calc)
Non-HDL Cholesterol (Calc): 70 mg/dL (calc) (ref <130)
Total CHOL/HDL Ratio: 2.1 (calc) (ref <5.0)
Triglycerides: 93 mg/dL (ref <150)

## 2021-01-28 LAB — COMPLETE METABOLIC PANEL WITH GFR
AG Ratio: 1.3 (calc) (ref 1.0–2.5)
ALT: 74 U/L — ABNORMAL HIGH (ref 6–29)
AST: 293 U/L — ABNORMAL HIGH (ref 10–30)
Albumin: 3.6 g/dL (ref 3.6–5.1)
Alkaline phosphatase (APISO): 149 U/L — ABNORMAL HIGH (ref 31–125)
BUN/Creatinine Ratio: 6 (calc) (ref 6–22)
BUN: 3 mg/dL — ABNORMAL LOW (ref 7–25)
CO2: 33 mmol/L — ABNORMAL HIGH (ref 20–32)
Calcium: 7.3 mg/dL — ABNORMAL LOW (ref 8.6–10.2)
Chloride: 97 mmol/L — ABNORMAL LOW (ref 98–110)
Creat: 0.52 mg/dL (ref 0.50–1.10)
GFR, Est African American: 139 mL/min/{1.73_m2} (ref >=60)
GFR, Est Non African American: 120 mL/min/{1.73_m2} (ref >=60)
Globulin: 2.7 g/dL (calc) (ref 1.9–3.7)
Glucose, Bld: 86 mg/dL (ref 65–99)
Potassium: 2.8 mmol/L — ABNORMAL LOW (ref 3.5–5.3)
Sodium: 143 mmol/L (ref 135–146)
Total Bilirubin: 0.9 mg/dL (ref 0.2–1.2)
Total Protein: 6.3 g/dL (ref 6.1–8.1)

## 2021-01-28 LAB — HEMOGLOBIN A1C
Hgb A1c MFr Bld: 5.1 % of total Hgb (ref <5.7)
Mean Plasma Glucose: 100 mg/dL
eAG (mmol/L): 5.5 mmol/L

## 2021-01-28 LAB — VITAMIN B12: Vitamin B-12: 645 pg/mL (ref 200–1100)

## 2021-01-28 LAB — VITAMIN D 25 HYDROXY (VIT D DEFICIENCY, FRACTURES): Vit D, 25-Hydroxy: 14 ng/mL — ABNORMAL LOW (ref 30–100)

## 2021-02-01 DIAGNOSIS — Z419 Encounter for procedure for purposes other than remedying health state, unspecified: Secondary | ICD-10-CM | POA: Diagnosis not present

## 2021-02-02 ENCOUNTER — Telehealth: Payer: Self-pay

## 2021-02-02 NOTE — Telephone Encounter (Signed)
spoke with pt, made appt for tomorrow with NP to discuss medication and potassium

## 2021-02-03 ENCOUNTER — Ambulatory Visit (INDEPENDENT_AMBULATORY_CARE_PROVIDER_SITE_OTHER): Payer: BC Managed Care – PPO | Admitting: Nurse Practitioner

## 2021-02-03 ENCOUNTER — Telehealth: Payer: Self-pay | Admitting: Family Medicine

## 2021-02-03 ENCOUNTER — Other Ambulatory Visit: Payer: Self-pay

## 2021-02-03 ENCOUNTER — Encounter: Payer: Self-pay | Admitting: Nurse Practitioner

## 2021-02-03 ENCOUNTER — Other Ambulatory Visit: Payer: Self-pay | Admitting: Nurse Practitioner

## 2021-02-03 VITALS — BP 158/120 | HR 100 | Temp 97.2°F | Ht 68.0 in | Wt 126.2 lb

## 2021-02-03 DIAGNOSIS — E876 Hypokalemia: Secondary | ICD-10-CM

## 2021-02-03 DIAGNOSIS — R202 Paresthesia of skin: Secondary | ICD-10-CM

## 2021-02-03 LAB — COMPLETE METABOLIC PANEL WITH GFR
AG Ratio: 1.2 (calc) (ref 1.0–2.5)
ALT: 64 U/L — ABNORMAL HIGH (ref 6–29)
AST: 216 U/L — ABNORMAL HIGH (ref 10–30)
Albumin: 3.9 g/dL (ref 3.6–5.1)
Alkaline phosphatase (APISO): 149 U/L — ABNORMAL HIGH (ref 31–125)
BUN/Creatinine Ratio: 15 (calc) (ref 6–22)
BUN: 7 mg/dL (ref 7–25)
CO2: 28 mmol/L (ref 20–32)
Calcium: 8 mg/dL — ABNORMAL LOW (ref 8.6–10.2)
Chloride: 100 mmol/L (ref 98–110)
Creat: 0.48 mg/dL — ABNORMAL LOW (ref 0.50–1.10)
GFR, Est African American: 142 mL/min/{1.73_m2} (ref >=60)
GFR, Est Non African American: 123 mL/min/{1.73_m2} (ref >=60)
Globulin: 3.2 g/dL (calc) (ref 1.9–3.7)
Glucose, Bld: 79 mg/dL (ref 65–99)
Potassium: 3.1 mmol/L — ABNORMAL LOW (ref 3.5–5.3)
Sodium: 141 mmol/L (ref 135–146)
Total Bilirubin: 0.7 mg/dL (ref 0.2–1.2)
Total Protein: 7.1 g/dL (ref 6.1–8.1)

## 2021-02-03 NOTE — Addendum Note (Signed)
Addended by: Cathlean Marseilles A on: 02/03/2021 02:28 PM   Modules accepted: Orders

## 2021-02-03 NOTE — Telephone Encounter (Signed)
I attempted to reach Samantha Rosales today to get her scheduled for a phone visit with the Managed Medicaid Team. I left my contact info on her voicemail. I will reach out again in the next 7-14 days if I have not heard back from her.

## 2021-02-03 NOTE — Progress Notes (Signed)
Subjective:    Patient ID: Samantha Rosales, female    DOB: 1981/03/20, 40 y.o.   MRN: [id number]  HPI: Samantha Rosales is a 40 y.o. female presenting for  Chief Complaint  Patient presents with   Numbness    Numbness in both feet, feet get really cold. Tried compression socks for 2 days. Too uncomfortable to wear. Has been going on for a while   skin irritation    Feels like skin in burning on the back   med question    Wants to make sure she is to take the bystolic along with the spirolactone med she is taking daily   TINGLING IN TOES Started about 1 month ago, started with toes on right foot.  Toes on left foot started shortly after, about 2 weeks.  Reports she is also having burning in her skin across her low back. Her potassium was checked about 1 week ago and was 2.8; she reports she is not taking potassium or calcium supplementation at this time.  Also has not been talking the spirolactone. Neuropathy status: uncontrolled  Satisfied with current treatment?: no Medication side effects: no Medication compliance:  poor compliance Location: bilateral toes Pain: no Severity:   Quality: feels "asleep" Frequency: constant Bilateral: yes Symmetric: yes Numbness: no Decreased sensation: no Weakness: no Context: stable Alleviating factors: nothing Aggravating factors:  nnothing Treatments attempted: compression socks  Humira for RA - helps a lot, is having flare hips down below knees.   Allergies  Allergen Reactions   Penicillins     Has patient had a PCN reaction causing immediate rash, facial/tongue/throat swelling, SOB or lightheadedness with hypotension: Unknown Has patient had a PCN reaction causing severe rash involving mucus membranes or skin necrosis: Unknown Has patient had a PCN reaction that required hospitalization: Unknown Has patient had a PCN reaction occurring within the last 10 years: No If all of the above answers are "NO", then may proceed with  Cephalosporin use.     Outpatient Encounter Medications as of 02/03/2021  Medication Sig   Biotin 1000 MCG tablet Take 1,000 mcg by mouth daily.   escitalopram (LEXAPRO) 10 MG tablet TAKE 1 TABLET BY MOUTH EVERY DAY   fenofibrate micronized (LOFIBRA) 200 MG capsule Take 1 capsule (200 mg total) by mouth at bedtime.   fluticasone (FLONASE) 50 MCG/ACT nasal spray USE TWO SPRAY(S) IN EACH NOSTRIL ONCE DAILY (Patient taking differently: as needed.)   folic acid (FOLVITE) 1 MG tablet Take 1 mg by mouth daily.   HUMIRA PEN 40 MG/0.4ML PNKT Inject 40 mg into the skin every 14 (fourteen) days.    Icosapent Ethyl (VASCEPA) 1 g CAPS Take 2 capsules (2 g total) by mouth 2 (two) times daily.   medroxyPROGESTERone (DEPO-PROVERA) 150 MG/ML injection INJECT 1 ML INTRAMUSCULARLY IN DOCTOR'S OFFICE EVERY 3 MONTHS. (Patient taking differently: Inject 150 mg into the muscle every 3 (three) months. INJECT 1 ML INTRAMUSCULARLY IN DOCTOR'S OFFICE EVERY 3 MONTHS.)   pantoprazole (PROTONIX) 40 MG tablet TAKE 1 TABLET BY MOUTH TWICE A DAY   spironolactone (ALDACTONE) 25 MG tablet Take 1 tablet (25 mg total) by mouth daily.   nebivolol (BYSTOLIC) 10 MG tablet Take 1 tablet (10 mg total) by mouth daily. (Patient not taking: Reported on 02/03/2021)   [DISCONTINUED] SUMAtriptan (IMITREX) 50 MG tablet Take 1 tablet (50 mg total) by mouth every 2 (two) hours as needed for migraine. May repeat in 2 hours if headache persists or recurs.  No facility-administered encounter medications on file as of 02/03/2021.    Patient Active Problem List   Diagnosis Date Noted   Pancreatitis    Hypertensive urgency 10/17/2019   Hypokalemia 10/17/2019   Hypomagnesemia 10/17/2019   Alcohol abuse 10/17/2019   Tobacco abuse 10/17/2019   Hypothyroidism 10/17/2019   Alcohol withdrawal (HCC) 10/17/2019   Lactic acidosis 10/17/2019   Confusion    Abnormal LFTs 09/05/2018   Elevated lipase 09/05/2018   History of  methotrexate therapy 09/05/2018   High triglycerides 09/05/2018   Elevated liver enzymes 07/01/2018   Essential hypertension 07/01/2018   Encounter for well woman exam with routine gynecological exam 07/01/2018   History of abnormal cervical Pap smear 06/26/2017   Encounter for surveillance of injectable contraceptive 06/26/2017   Encounter for gynecological examination with Papanicolaou smear of cervix 06/26/2017   Arthritis, rheumatic, acute or subacute 11/18/2013   Urticaria, idiopathic 08/24/2013   HSV-2 (herpes simplex virus 2) infection 05/29/2013   HPV (human papilloma virus) infection 05/29/2013    Past Medical History:  Diagnosis Date   Abnormal Pap smear    Alcohol use    Allergic rhinitis    BV (bacterial vaginosis)    GERD (gastroesophageal reflux disease)    Hepatitis    LFT's >1000 (2019) due to methotrexate vs alcohol   High cholesterol    HSV-2 (herpes simplex virus 2) infection    Hypertension    Pancreatitis    12/19 (thought to be due to alcohol)   Rheumatoid arthritis (HCC)    Seizures (HCC)    dx as child with epilepsy but no seizures since 89 and on no meds   Vaginal Pap smear, abnormal    Relevant past medical, surgical, family and social history reviewed and updated as indicated. Interim medical history since our last visit reviewed.  Review of Systems  Constitutional: Negative.   Respiratory: Negative.  Negative for chest tightness, shortness of breath and wheezing.   Cardiovascular: Negative.  Negative for chest pain, palpitations and leg swelling.  Skin: Negative.  Negative for color change.  Neurological: Negative for dizziness, seizures and weakness.       + tingling in toes  Hematological: Negative.   Psychiatric/Behavioral: Negative.    Per HPI unless specifically indicated above     Objective:    BP (!) 158/120    Pulse 100    Temp (!) 97.2 F (36.2 C)    Ht 5\' 8"  (1.727 m)    Wt 126 lb 3.2 oz (57.2 kg)    SpO2  100%    BMI 19.19 kg/m   Wt Readings from Last 3 Encounters:  02/03/21 126 lb 3.2 oz (57.2 kg)  04/15/20 145 lb (65.8 kg)  11/14/19 158 lb (71.7 kg)    Physical Exam Vitals and nursing note reviewed.  Constitutional:      General: She is not in acute distress.    Appearance: She is not toxic-appearing.  HENT:     Head: Normocephalic and atraumatic.  Eyes:     General: No scleral icterus.    Extraocular Movements: Extraocular movements intact.  Cardiovascular:     Rate and Rhythm: Normal rate and regular rhythm.     Heart sounds: Normal heart sounds. No murmur heard.   Pulmonary:     Effort: Pulmonary effort is normal. No respiratory distress.     Breath sounds: No wheezing or rhonchi.  Musculoskeletal:     Lumbar back: Normal. No swelling, edema, deformity, spasms, tenderness or bony  tenderness. Normal range of motion. Negative right straight leg raise test and negative left straight leg raise test.     Right lower leg: No edema.     Left lower leg: No edema.     Right foot: Normal. Normal range of motion and normal capillary refill. No swelling, deformity, tenderness or bony tenderness. Normal pulse.     Left foot: Normal. Normal range of motion and normal capillary refill. No swelling, deformity, tenderness or bony tenderness. Normal pulse.  Skin:    General: Skin is warm and dry.     Capillary Refill: Capillary refill takes less than 2 seconds.     Coloration: Skin is not jaundiced or pale.     Findings: No erythema.  Neurological:     Mental Status: She is oriented to person, place, and time.     Sensory: Sensation is intact. No sensory deficit.     Motor: No weakness or atrophy.     Gait: Gait normal.  Psychiatric:        Mood and Affect: Mood normal.        Behavior: Behavior normal.        Thought Content: Thought content normal.        Judgment: Judgment normal.       Assessment & Plan:  1. Low blood potassium Acute.  Noted on labs last week.  Low potassium,  coupled with low calcium, are most likely the cause of her symptoms today.  I discussed the severity of this and the importance of her taking the Potassium Chloride supplements.  We also discussed cutting back on alcohol intake. She should also take Vitamin D3, Bystolic, and Spironolactone as prescribed.  I have rechecked a CMET today, although I do not think that there will be much improvement in the numbers as it does not sound like she has started the potassium yet as recommended last week.  No chest pain or palpitations today, encouraged to go to ER with these symptoms.  Follow up next week to recheck Potassium and calcium.    - COMPLETE METABOLIC PANEL WITH GFR  2. Tingling of both feet Likely secondary due to low potassium and calcium.  Replace as previously instructed and follow up early next week for lab recheck.  With any chest pain, palpitations, or muscle twitching, go to ER.    Follow up plan: Return in about 4 days (around 02/07/2021) for blood work/tingling follow up.

## 2021-02-03 NOTE — Patient Instructions (Signed)
F/u for blood work Monday or Tuesday

## 2021-02-04 LAB — VITAMIN D 25 HYDROXY (VIT D DEFICIENCY, FRACTURES): Vit D, 25-Hydroxy: 19 ng/mL — ABNORMAL LOW (ref 30–100)

## 2021-02-14 ENCOUNTER — Other Ambulatory Visit: Payer: Self-pay | Admitting: Nurse Practitioner

## 2021-02-14 ENCOUNTER — Other Ambulatory Visit: Payer: Self-pay

## 2021-02-14 DIAGNOSIS — E876 Hypokalemia: Secondary | ICD-10-CM

## 2021-02-14 MED ORDER — POTASSIUM CHLORIDE CRYS ER 20 MEQ PO TBCR: 20.0000 meq | EXTENDED_RELEASE_TABLET | Freq: Every day | ORAL | 0 refills | Status: DC

## 2021-02-21 ENCOUNTER — Ambulatory Visit: Payer: BC Managed Care – PPO | Admitting: Nurse Practitioner

## 2021-02-21 ENCOUNTER — Other Ambulatory Visit: Payer: Self-pay

## 2021-02-21 DIAGNOSIS — E876 Hypokalemia: Secondary | ICD-10-CM

## 2021-02-22 ENCOUNTER — Other Ambulatory Visit: Payer: Self-pay

## 2021-02-22 LAB — PTH, INTACT AND CALCIUM
Calcium: 8.1 mg/dL — ABNORMAL LOW (ref 8.6–10.2)
PTH: 43 pg/mL (ref 16–77)

## 2021-02-22 NOTE — Patient Outreach (Signed)
Care Coordination  02/22/2021  [name] 08-02-1981 [id number]   Medicaid Managed Care   Unsuccessful Outreach Note  02/22/2021 Name: Samantha Rosales MRN: [id number] DOB: 1981/02/19  Referred by: Donita Brooks, MD Reason for referral : High Risk Managed Medicaid (MM Screen Unsuccessful Telephone Outreach)   An unsuccessful telephone outreach was attempted today. The patient was referred to the case management team for assistance with care management and care coordination.   Follow Up Plan: The care management team will reach out to the patient again over the next 7 days.   Gus Puma, BSW, [place] [facility]  [place]  High Risk Managed Medicaid Team

## 2021-02-22 NOTE — Patient Instructions (Signed)
Visit Information  Ms. Samantha Rosales  - as a part of your Medicaid benefit, you are eligible for care management and care coordination services at no cost or copay. I was unable to reach you by phone today but would be happy to help you with your health related needs. Please feel free to call me @ [phone].   A member of the Managed Medicaid care management team will reach out to you again over the next 7 days.   Lisa-Marie Rueger, BSW, [facility]  [place]  High Risk Managed Medicaid Team    

## 2021-02-23 ENCOUNTER — Telehealth: Payer: Self-pay

## 2021-02-23 NOTE — Telephone Encounter (Signed)
Patient return call about her labs. Patient cb # [phone].

## 2021-02-24 LAB — COMPLETE METABOLIC PANEL WITH GFR
AG Ratio: 1.3 (calc) (ref 1.0–2.5)
ALT: 78 U/L — ABNORMAL HIGH (ref 6–29)
AST: 163 U/L — ABNORMAL HIGH (ref 10–30)
Albumin: 3.7 g/dL (ref 3.6–5.1)
Alkaline phosphatase (APISO): 116 U/L (ref 31–125)
BUN: 11 mg/dL (ref 7–25)
CO2: 25 mmol/L (ref 20–32)
Calcium: 7.9 mg/dL — ABNORMAL LOW (ref 8.6–10.2)
Chloride: 103 mmol/L (ref 98–110)
Creat: 0.55 mg/dL (ref 0.50–1.10)
GFR, Est African American: 136 mL/min/{1.73_m2} (ref >=60)
GFR, Est Non African American: 117 mL/min/{1.73_m2} (ref >=60)
Globulin: 2.9 g/dL (calc) (ref 1.9–3.7)
Glucose, Bld: 148 mg/dL — ABNORMAL HIGH (ref 65–99)
Potassium: 3.1 mmol/L — ABNORMAL LOW (ref 3.5–5.3)
Sodium: 143 mmol/L (ref 135–146)
Total Bilirubin: 0.4 mg/dL (ref 0.2–1.2)
Total Protein: 6.6 g/dL (ref 6.1–8.1)

## 2021-02-24 LAB — TEST AUTHORIZATION

## 2021-02-24 LAB — MAGNESIUM: Magnesium: 1.4 mg/dL — ABNORMAL LOW (ref 1.5–2.5)

## 2021-02-24 MED ORDER — POTASSIUM CHLORIDE CRYS ER 20 MEQ PO TBCR: 20.0000 meq | EXTENDED_RELEASE_TABLET | Freq: Every day | ORAL | 0 refills | Status: DC

## 2021-02-24 NOTE — Telephone Encounter (Signed)
Sent result note to mychart regarding labs.  Please re-iterate results and ensure patient is taking spironolactone.

## 2021-02-24 NOTE — Telephone Encounter (Signed)
Labs resulted on 02/21/2021.  Please advise.

## 2021-03-02 ENCOUNTER — Other Ambulatory Visit: Payer: Self-pay

## 2021-03-03 ENCOUNTER — Other Ambulatory Visit: Payer: Self-pay

## 2021-03-03 NOTE — Patient Outreach (Signed)
Care Coordination  03/03/2021  Samantha Rosales 12/24/1980 [id number]   Medicaid Managed Care   Unsuccessful Outreach Note  03/03/2021 Name: Samantha Rosales MRN: [id number] DOB: 1980-12-23  Referred by: Donita Brooks, MD Reason for referral : High Risk Managed Medicaid (MM Screen Unsuccessful Telephone Outreach)   A second unsuccessful telephone outreach was attempted today. The patient was referred to the case management team for assistance with care management and care coordination.   Follow Up Plan: The care management team will reach out to the patient again over the next 7 days.   Gus Puma, BSW, [place] [facility]  [place]  High Risk Managed Medicaid Team

## 2021-03-03 NOTE — Patient Instructions (Signed)
Visit Information  Ms. Audrie Lia Hijazi  - as a part of your Medicaid benefit, you are eligible for care management and care coordination services at no cost or copay. I was unable to reach you by phone today but would be happy to help you with your health related needs. Please feel free to call me @ [phone].   A member of the Managed Medicaid care management team will reach out to you again over the next 7 days.   Gus Puma, BSW, [place] [facility]  [place]  High Risk Managed Medicaid Team

## 2021-03-04 DIAGNOSIS — Z419 Encounter for procedure for purposes other than remedying health state, unspecified: Secondary | ICD-10-CM | POA: Diagnosis not present

## 2021-03-08 ENCOUNTER — Telehealth: Payer: Self-pay | Admitting: Family Medicine

## 2021-03-08 NOTE — Telephone Encounter (Signed)
Left message on patient's voicemail to change appt with provider from 4/20 to 4/19 at the same time of 8am. Advised patient that Shanda Bumps will be out of the office and asked her to call back to either confirm or reschedule. Awaiting reply.

## 2021-03-14 ENCOUNTER — Telehealth: Payer: Self-pay

## 2021-03-14 NOTE — Patient Instructions (Signed)
Visit Information  Ms. Samantha Rosales  - as a part of your Medicaid benefit, you are eligible for care management and care coordination services at no cost or copay. I was unable to reach you by phone today but would be happy to help you with your health related needs. Please feel free to call me @ [phone].     Gus Puma, BSW, [place] [facility]  [place]  High Risk Managed Medicaid Team

## 2021-03-14 NOTE — Patient Outreach (Signed)
Care Coordination  03/14/2021  Samantha Rosales Oct 27, 1981 [id number]   Medicaid Managed Care   Unsuccessful Outreach Note  03/14/2021 Name: Samantha Rosales MRN: [id number] DOB: [birth year]  Referred by: Donita Brooks, MD Reason for referral : High Risk Managed Medicaid (MM Screen Social Work Outreach)   Third unsuccessful telephone outreach was attempted today. The patient was referred to the case management team for assistance with care management and care coordination. The patient's primary care provider has been notified of our unsuccessful attempts to make or maintain contact with the patient. The care management team is pleased to engage with this patient at any time in the future should he/she be interested in assistance from the care management team.   Follow Up Plan: The patient has been provided with contact information for the care management team and has been advised to call with any health related questions or concerns.   Gus Puma, BSW, [place] [facility]  [place]  High Risk Managed Medicaid Team

## 2021-03-21 ENCOUNTER — Other Ambulatory Visit: Payer: Self-pay

## 2021-03-21 DIAGNOSIS — H93293 Other abnormal auditory perceptions, bilateral: Secondary | ICD-10-CM | POA: Insufficient documentation

## 2021-03-22 ENCOUNTER — Ambulatory Visit: Payer: Self-pay | Admitting: Nurse Practitioner

## 2021-03-22 ENCOUNTER — Other Ambulatory Visit: Payer: Self-pay

## 2021-03-22 DIAGNOSIS — H469 Unspecified optic neuritis: Secondary | ICD-10-CM | POA: Insufficient documentation

## 2021-03-22 DIAGNOSIS — H3581 Retinal edema: Secondary | ICD-10-CM | POA: Insufficient documentation

## 2021-03-23 ENCOUNTER — Ambulatory Visit: Payer: Self-pay | Admitting: Nurse Practitioner

## 2021-03-29 ENCOUNTER — Other Ambulatory Visit: Payer: BC Managed Care – PPO

## 2021-03-29 ENCOUNTER — Other Ambulatory Visit: Payer: Self-pay

## 2021-03-29 LAB — COMPLETE METABOLIC PANEL WITH GFR
AG Ratio: 1.4 (calc) (ref 1.0–2.5)
ALT: 65 U/L — ABNORMAL HIGH (ref 6–29)
AST: 339 U/L — ABNORMAL HIGH (ref 10–30)
Albumin: 4.2 g/dL (ref 3.6–5.1)
Alkaline phosphatase (APISO): 120 U/L (ref 31–125)
BUN/Creatinine Ratio: 5 (calc) — ABNORMAL LOW (ref 6–22)
BUN: 3 mg/dL — ABNORMAL LOW (ref 7–25)
CO2: 24 mmol/L (ref 20–32)
Calcium: 8.7 mg/dL (ref 8.6–10.2)
Chloride: 102 mmol/L (ref 98–110)
Creat: 0.56 mg/dL (ref 0.50–1.10)
GFR, Est African American: 135 mL/min/{1.73_m2} (ref >=60)
GFR, Est Non African American: 117 mL/min/{1.73_m2} (ref >=60)
Globulin: 3 g/dL (calc) (ref 1.9–3.7)
Glucose, Bld: 84 mg/dL (ref 65–99)
Potassium: 2.9 mmol/L — ABNORMAL LOW (ref 3.5–5.3)
Sodium: 143 mmol/L (ref 135–146)
Total Bilirubin: 0.5 mg/dL (ref 0.2–1.2)
Total Protein: 7.2 g/dL (ref 6.1–8.1)

## 2021-03-29 LAB — MAGNESIUM: Magnesium: 1.5 mg/dL (ref 1.5–2.5)

## 2021-04-01 ENCOUNTER — Telehealth (INDEPENDENT_AMBULATORY_CARE_PROVIDER_SITE_OTHER): Payer: BC Managed Care – PPO | Admitting: Nurse Practitioner

## 2021-04-01 ENCOUNTER — Encounter: Payer: Self-pay | Admitting: Nurse Practitioner

## 2021-04-01 ENCOUNTER — Other Ambulatory Visit: Payer: Self-pay

## 2021-04-01 DIAGNOSIS — E876 Hypokalemia: Secondary | ICD-10-CM

## 2021-04-01 DIAGNOSIS — R748 Abnormal levels of other serum enzymes: Secondary | ICD-10-CM

## 2021-04-01 MED ORDER — POTASSIUM CHLORIDE CRYS ER 20 MEQ PO TBCR: 20.0000 meq | EXTENDED_RELEASE_TABLET | Freq: Every day | ORAL | 0 refills | Status: DC

## 2021-04-01 NOTE — Progress Notes (Signed)
Subjective:    Patient ID: Samantha Rosales, female    DOB: 1981/08/28, 40 y.o.   MRN: [id number]  HPI: Samantha Rosales is a 40 y.o. female presenting virtually for lab work follow up.  Chief Complaint  Patient presents with  . Results    Wants to talk about the lab results and coming off some of the medications she's taking.  Has MRI scheduled for May 12 for eyes   Patient reports she is curious about her lab work.  She is wondering why her liver enzymes are still elevated and why her potassium is low.  She is wondering if she can come off of any of the medication she is on.  She is not currently taking the Vascepa because the pills are too big.  She does not have any new concerns or complaints today.  She denies recently starting any new medication recently.  She did recently complete a round of prednisone for her RA.   She reports she has cut back on alcohol, she is now drinking 2-3 [place] per day.  She is smoking cigarettes and not interested in quitting smoking at this time.    Allergies  Allergen Reactions  . Penicillins     Has patient had a PCN reaction causing immediate rash, facial/tongue/throat swelling, SOB or lightheadedness with hypotension: Unknown Has patient had a PCN reaction causing severe rash involving mucus membranes or skin necrosis: Unknown Has patient had a PCN reaction that required hospitalization: Unknown Has patient had a PCN reaction occurring within the last 10 years: No If all of the above answers are "NO", then may proceed with Cephalosporin use.     Outpatient Encounter Medications as of 04/01/2021  Medication Sig  . Biotin 1000 MCG tablet Take 1,000 mcg by mouth daily.  . cycloSPORINE (RESTASIS) 0.05 % ophthalmic emulsion INSTILL 1 DROP INTO AFFECTED EYE EVERY 12 HOURS  . escitalopram (LEXAPRO) 10 MG tablet TAKE 1 TABLET BY MOUTH EVERY DAY  . fenofibrate micronized (LOFIBRA) 200 MG capsule Take 1 capsule (200 mg total) by mouth at  bedtime.  . fluticasone (FLONASE) 50 MCG/ACT nasal spray USE TWO SPRAY(S) IN EACH NOSTRIL ONCE DAILY (Patient taking differently: as needed.)  . folic acid (FOLVITE) 1 MG tablet Take 1 mg by mouth daily.  Marland Kitchen HUMIRA PEN 40 MG/0.4ML PNKT Inject 40 mg into the skin every 14 (fourteen) days.   Bess Harvest Ethyl (VASCEPA) 1 g CAPS Take 2 capsules (2 g total) by mouth 2 (two) times daily.  . medroxyPROGESTERone (DEPO-PROVERA) 150 MG/ML injection INJECT 1 ML INTRAMUSCULARLY IN DOCTOR'S OFFICE EVERY 3 MONTHS. (Patient taking differently: Inject 150 mg into the muscle every 3 (three) months. INJECT 1 ML INTRAMUSCULARLY IN DOCTOR'S OFFICE EVERY 3 MONTHS.)  . nebivolol (BYSTOLIC) 10 MG tablet Take 1 tablet (10 mg total) by mouth daily.  . pantoprazole (PROTONIX) 40 MG tablet TAKE 1 TABLET BY MOUTH TWICE A DAY  . spironolactone (ALDACTONE) 25 MG tablet Take 1 tablet (25 mg total) by mouth daily.  . potassium chloride SA (KLOR-CON) 20 MEQ tablet Take 1 tablet (20 mEq total) by mouth daily.  . [DISCONTINUED] potassium chloride SA (KLOR-CON) 20 MEQ tablet Take 1 tablet (20 mEq total) by mouth daily.  . [DISCONTINUED] SUMAtriptan (IMITREX) 50 MG tablet Take 1 tablet (50 mg total) by mouth every 2 (two) hours as needed for migraine. May repeat in 2 hours if headache persists or recurs.   No facility-administered encounter medications on file  as of 04/01/2021.    Patient Active Problem List   Diagnosis Date Noted  . Bilateral optic neuropathy 03/22/2021  . Retinal edema 03/22/2021  . Abnormal auditory perception of both ears 03/21/2021  . Pancreatitis   . Hypertensive urgency 10/17/2019  . Hypokalemia 10/17/2019  . Hypomagnesemia 10/17/2019  . Alcohol abuse 10/17/2019  . Tobacco abuse 10/17/2019  . Hypothyroidism 10/17/2019  . Alcohol withdrawal (HCC) 10/17/2019  . Lactic acidosis 10/17/2019  . Confusion   . Abnormal LFTs 09/05/2018  . Elevated lipase 09/05/2018  . History of methotrexate therapy  09/05/2018  . High triglycerides 09/05/2018  . Elevated liver enzymes 07/01/2018  . Essential hypertension 07/01/2018  . Encounter for well woman exam with routine gynecological exam 07/01/2018  . History of abnormal cervical Pap smear 06/26/2017  . Encounter for surveillance of injectable contraceptive 06/26/2017  . Encounter for gynecological examination with Papanicolaou smear of cervix 06/26/2017  . High risk medication use 06/14/2015  . Rheumatoid arthritis involving multiple sites with positive rheumatoid factor (HCC) 02/24/2015  . Arthritis, rheumatic, acute or subacute 11/18/2013  . Urticaria, idiopathic 08/24/2013  . HSV-2 (herpes simplex virus 2) infection 05/29/2013  . HPV (human papilloma virus) infection 05/29/2013    Past Medical History:  Diagnosis Date  . Abnormal Pap smear   . Alcohol use   . Allergic rhinitis   . BV (bacterial vaginosis)   . GERD (gastroesophageal reflux disease)   . Hepatitis    LFT's >1000 (2019) due to methotrexate vs alcohol  . High cholesterol   . HSV-2 (herpes simplex virus 2) infection   . Hypertension   . Pancreatitis    12/19 (thought to be due to alcohol)  . Rheumatoid arthritis (HCC)   . Seizures (HCC)    dx as child with epilepsy but no seizures since 89 and on no meds  . Vaginal Pap smear, abnormal     Relevant past medical, surgical, family and social history reviewed and updated as indicated. Interim medical history since our last visit reviewed.  Review of Systems  Constitutional: Negative.   HENT: Negative.   Eyes: Negative.   Respiratory: Negative.   Cardiovascular: Negative.   Gastrointestinal: Negative.   Skin: Negative.   Neurological: Negative.   Psychiatric/Behavioral: Negative.    Per HPI unless specifically indicated above     Objective:    There were no vitals taken for this visit.  Wt Readings from Last 3 Encounters:  02/03/21 126 lb 3.2 oz (57.2 kg)  04/15/20 145 lb (65.8 kg)  11/14/19 158 lb  (71.7 kg)    Physical Exam Physical examination unable to be performed due to lack of equipment.  Results for orders placed or performed in visit on 03/29/21  COMPLETE METABOLIC PANEL WITH GFR  Result Value Ref Range   Glucose, Bld 84 65 - 99 mg/dL   BUN 3 (L) 7 - 25 mg/dL   Creat 1.61 0.96 - 0.45 mg/dL   GFR, Est Non African American 117 > OR = 60 mL/min/1.82m2   GFR, Est African American 135 > OR = 60 mL/min/1.38m2   BUN/Creatinine Ratio 5 (L) 6 - 22 (calc)   Sodium 143 135 - 146 mmol/L   Potassium 2.9 (L) 3.5 - 5.3 mmol/L   Chloride 102 98 - 110 mmol/L   CO2 24 20 - 32 mmol/L   Calcium 8.7 8.6 - 10.2 mg/dL   Total Protein 7.2 6.1 - 8.1 g/dL   Albumin 4.2 3.6 - 5.1 g/dL   Globulin  3.0 1.9 - 3.7 g/dL (calc)   AG Ratio 1.4 1.0 - 2.5 (calc)   Total Bilirubin 0.5 0.2 - 1.2 mg/dL   Alkaline phosphatase (APISO) 120 31 - 125 U/L   AST 339 (H) 10 - 30 U/L   ALT 65 (H) 6 - 29 U/L  Magnesium  Result Value Ref Range   Magnesium 1.5 1.5 - 2.5 mg/dL      Assessment & Plan:  1. Low blood potassium Potassium 2.9 on most recent lab work.  Unclear cause of hypokalemia - question if secondary to alcoholism.  Patient reports compliance with Aldactone, but stopped Potassium because she ran out.  Will refill today and recheck blood work in 3 months.   - potassium chloride SA (KLOR-CON) 20 MEQ tablet; Take 1 tablet (20 mEq total) by mouth daily.  Dispense: 90 tablet; Refill: 0  2. Elevated liver enzymes Chronic.  Question if secondary to alcoholism, would like to rule out other causes.  Will obtain ultrasound of liver and place referral to GI for further work up and management.  - Ambulatory referral to Gastroenterology - US Abdomen Limited RUQ (LIVER/GB); Future     Follow up plan: Return in about 3 months (around 07/01/2021) for lab work recheck.   This visit was completed via telephone due to the restrictions of the COVID-19 pandemic. All issues as above were discussed and addressed  but no physical exam was performed. If it was felt that the patient should be evaluated in the office, they were directed there. The patient verbally consented to this visit. Patient was unable to complete an audio/visual visit due to Lack of equipment.  Was able to connect to video on [employer], however patient was unable to hear what I was saying. . Location of the patient: home . Location of the provider: work . Those involved with this call:  . Provider: Cathlean Marseilles, DNP, FNP-C . CMA: Moises Blood, CMA . Front Desk/Registration: Percival Spanish  . Time spent on call: 14 minutes on the phone discussing health concerns. 20 minutes total spent in review of patient's record and preparation of their chart.  I verified patient identity using two factors (patient name and date of birth). Patient consents verbally to being seen via telemedicine visit today.

## 2021-04-03 DIAGNOSIS — Z419 Encounter for procedure for purposes other than remedying health state, unspecified: Secondary | ICD-10-CM | POA: Diagnosis not present

## 2021-04-05 ENCOUNTER — Other Ambulatory Visit: Payer: Self-pay

## 2021-04-05 MED ORDER — SPIRONOLACTONE 25 MG PO TABS: 25.0000 mg | ORAL_TABLET | Freq: Every day | ORAL | 3 refills | Status: DC

## 2021-04-05 MED ORDER — NEBIVOLOL HCL 10 MG PO TABS: 10.0000 mg | ORAL_TABLET | Freq: Every day | ORAL | 3 refills | Status: DC

## 2021-04-19 ENCOUNTER — Other Ambulatory Visit: Payer: Self-pay | Admitting: Adult Health

## 2021-04-21 ENCOUNTER — Other Ambulatory Visit: Payer: Self-pay

## 2021-04-21 ENCOUNTER — Ambulatory Visit (INDEPENDENT_AMBULATORY_CARE_PROVIDER_SITE_OTHER): Payer: BC Managed Care – PPO

## 2021-04-21 DIAGNOSIS — Z3042 Encounter for surveillance of injectable contraceptive: Secondary | ICD-10-CM | POA: Diagnosis not present

## 2021-04-21 MED ORDER — MEDROXYPROGESTERONE ACETATE 150 MG/ML IM SUSP
150.0000 mg | Freq: Once | INTRAMUSCULAR | Status: AC
  Administered 2021-04-21: 150 mg via INTRAMUSCULAR

## 2021-04-21 NOTE — Progress Notes (Signed)
   NURSE VISIT- INJECTION  SUBJECTIVE:  Samantha Rosales is a 40 y.o. [phone] female here for a Depo Provera for contraception/period management. She is a GYN patient.   OBJECTIVE:  There were no vitals taken for this visit.  Appears well, in no apparent distress  Injection administered in: Right deltoid  Meds ordered this encounter  Medications  . medroxyPROGESTERone (DEPO-PROVERA) injection 150 mg    ASSESSMENT: GYN patient Depo Provera for contraception/period management PLAN: Follow-up: in 11-13 weeks for next Depo   Himmat Enberg A Juniper Snyders  04/21/2021 9:25 AM

## 2021-04-25 ENCOUNTER — Other Ambulatory Visit: Payer: BC Managed Care – PPO

## 2021-05-03 ENCOUNTER — Encounter: Payer: Self-pay | Admitting: Emergency Medicine

## 2021-05-03 ENCOUNTER — Ambulatory Visit (INDEPENDENT_AMBULATORY_CARE_PROVIDER_SITE_OTHER): Payer: BC Managed Care – PPO

## 2021-05-03 ENCOUNTER — Ambulatory Visit
Admission: EM | Admit: 2021-05-03 | Discharge: 2021-05-03 | Disposition: A | Discharge status: Home / Self Care | Payer: BC Managed Care – PPO | Attending: Family Medicine | Admitting: Family Medicine

## 2021-05-03 DIAGNOSIS — M25571 Pain in right ankle and joints of right foot: Secondary | ICD-10-CM

## 2021-05-03 DIAGNOSIS — S82434A Nondisplaced oblique fracture of shaft of right fibula, initial encounter for closed fracture: Secondary | ICD-10-CM | POA: Diagnosis not present

## 2021-05-03 DIAGNOSIS — W19XXXA Unspecified fall, initial encounter: Secondary | ICD-10-CM

## 2021-05-03 IMAGING — DX DG ANKLE COMPLETE 3+V*R*
3 series · 3 of 3 positions shown · non-contrast
Comparison: None.

CLINICAL DATA: Fall 4 days ago.  Lateral ankle pain and swelling.

EXAM:
RIGHT ANKLE - COMPLETE 3+ VIEW

[ankle ap]
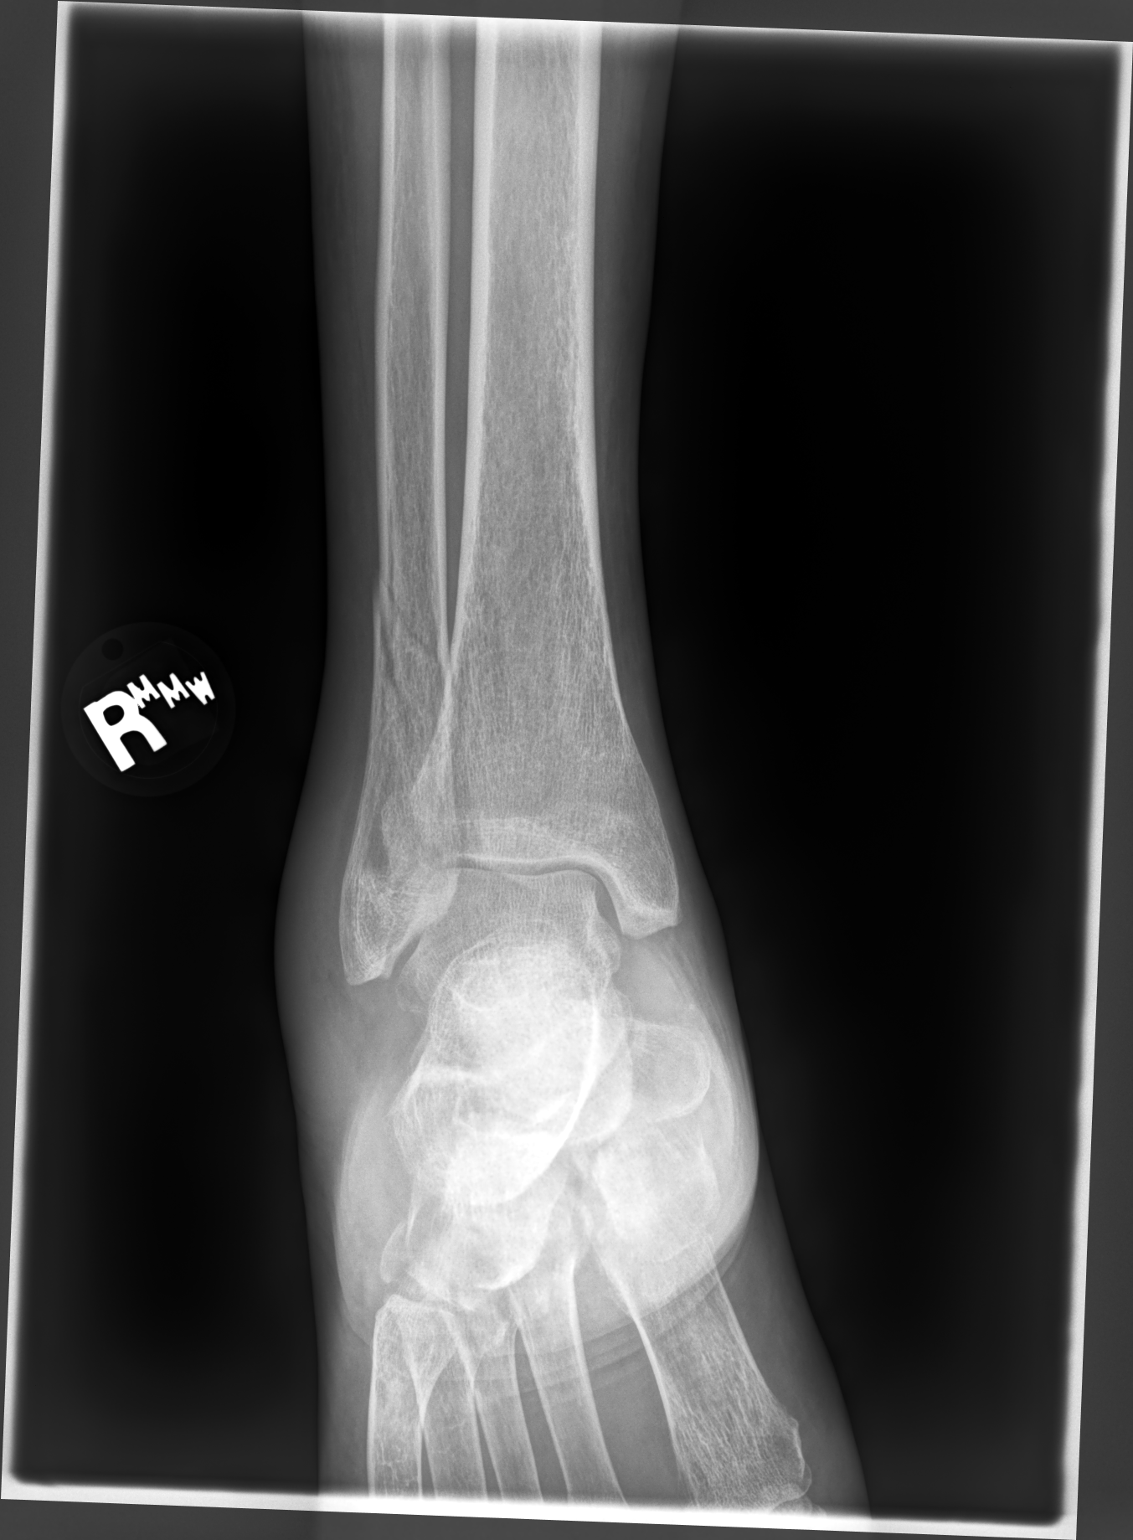

[ankle mlo]
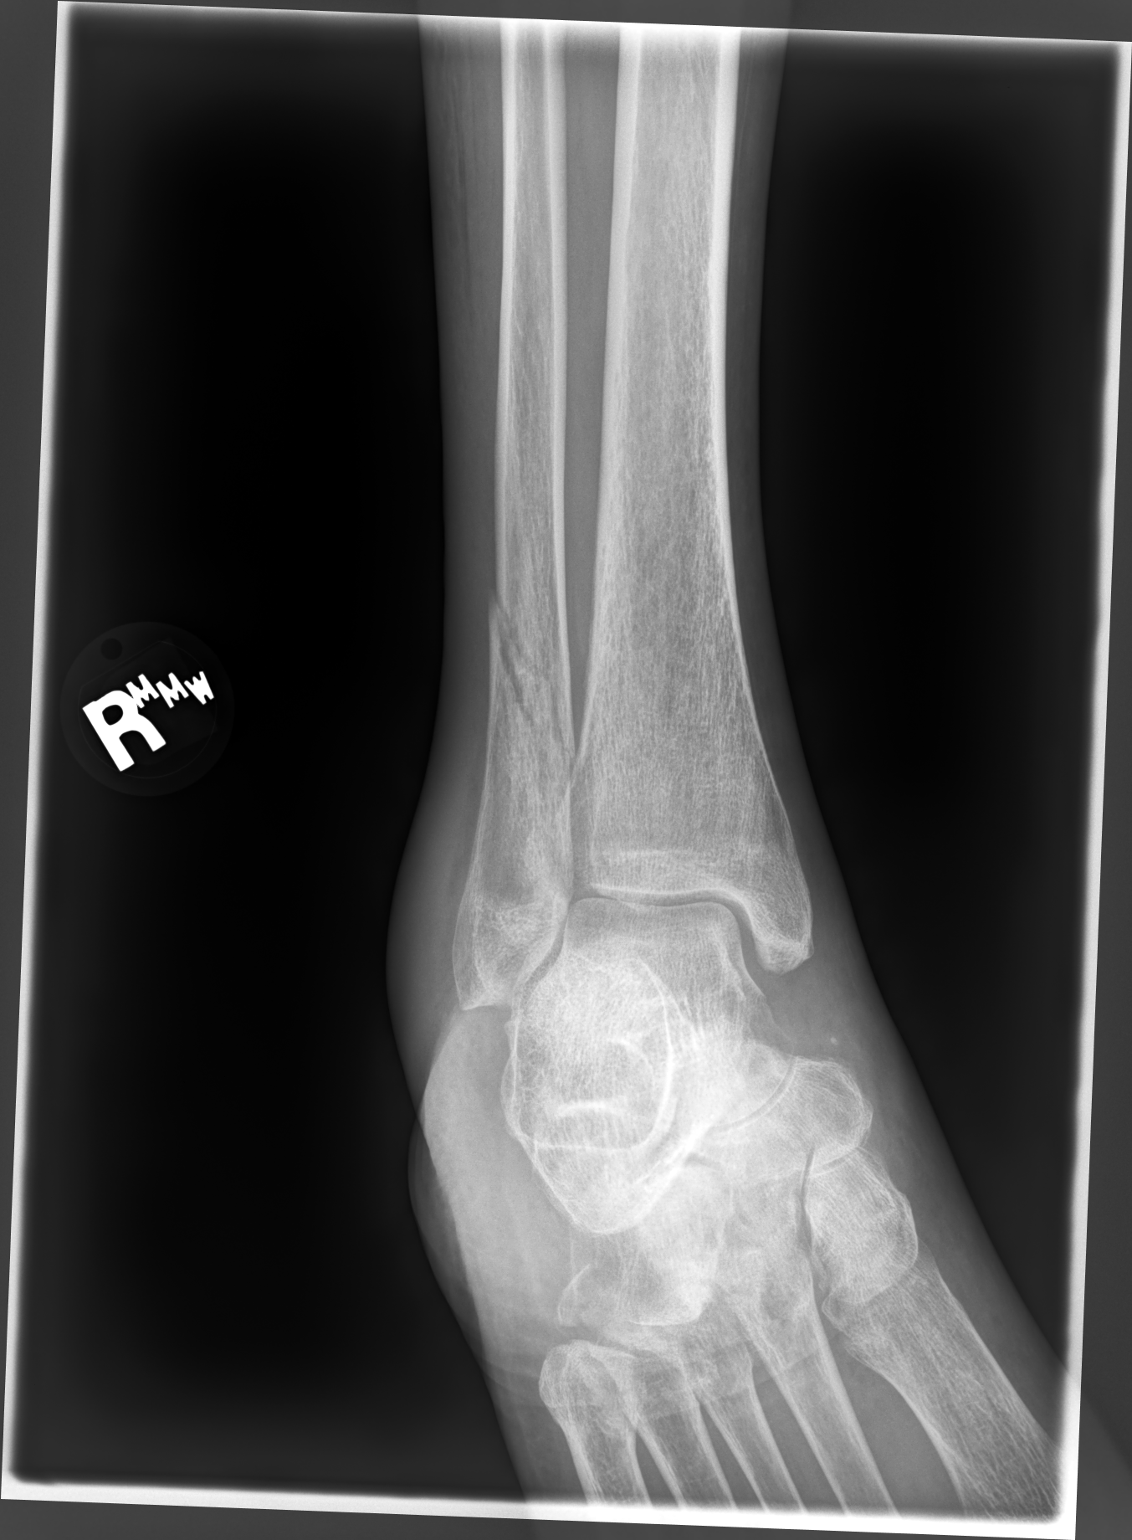

[ankle lat]
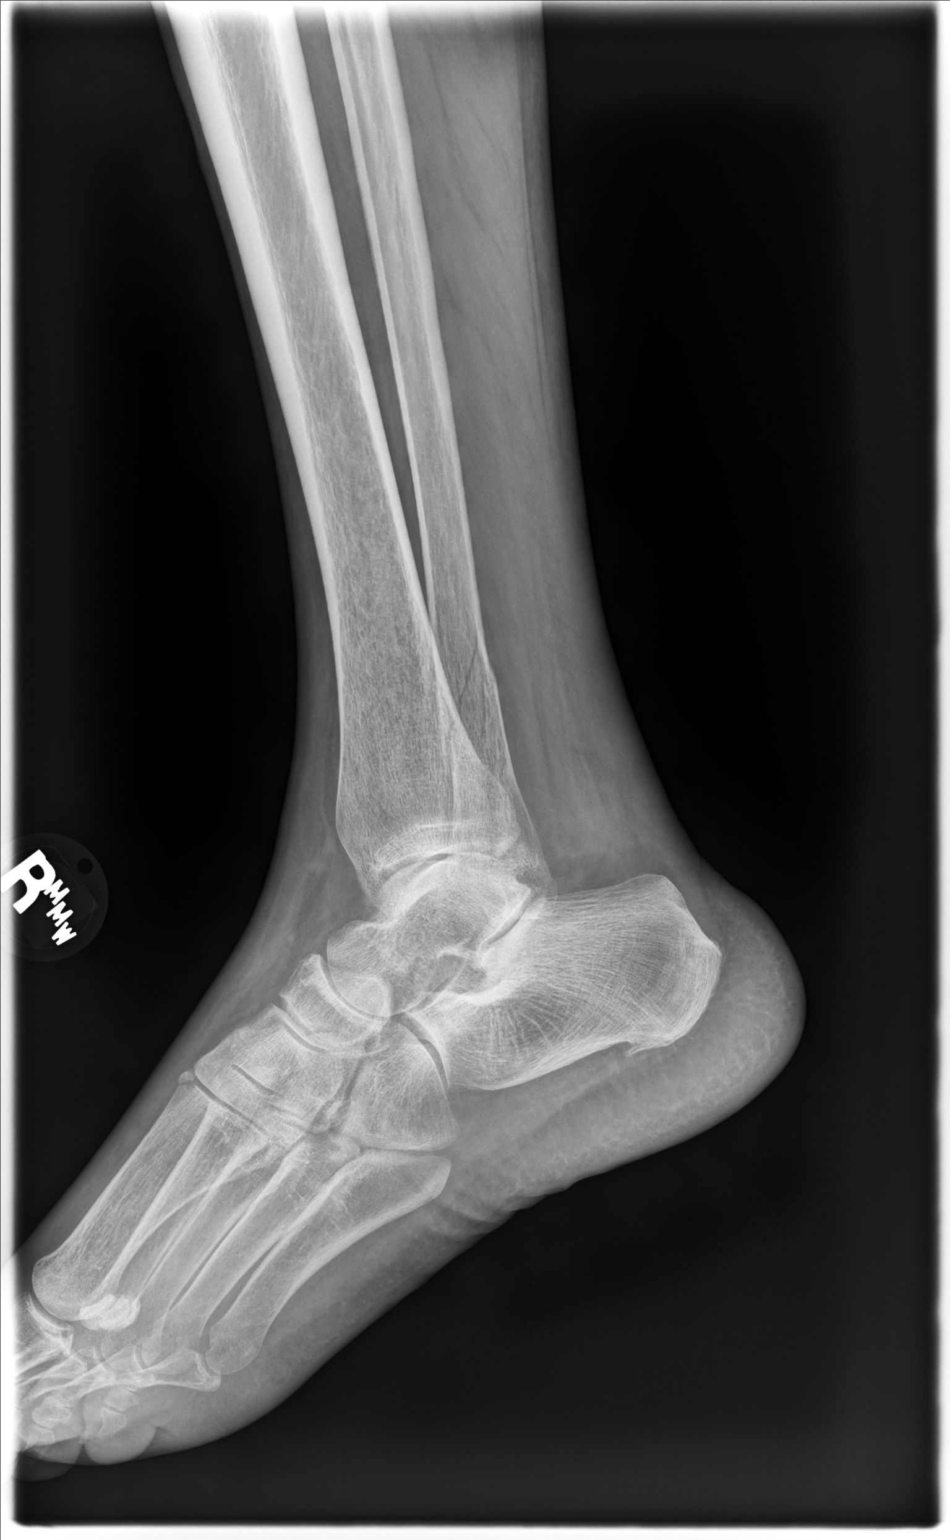

[3 of 3 positions shown; findings below may reference images not displayed]

FINDINGS: A minimally displaced oblique fracture of the distal fibular
diaphysis is seen. No other fractures identified. No evidence of
dislocation. Small plantar calcaneal bone spur incidentally noted.
IMPRESSION: Minimally displaced oblique fracture of the distal fibular
diaphysis.

## 2021-05-03 MED ORDER — TRIO ROLLING WALKER MISC: 1.0000 | Freq: Every day | 0 refills | Status: DC

## 2021-05-03 MED ORDER — FREE SPIRIT KNEE/LEG WALKER MISC: 1.0000 | Freq: Every day | 0 refills | Status: DC

## 2021-05-03 MED ORDER — IBUPROFEN 600 MG PO TABS: 600.0000 mg | ORAL_TABLET | Freq: Four times a day (QID) | ORAL | 0 refills | Status: DC | PRN

## 2021-05-03 MED ORDER — TRAMADOL HCL 50 MG PO TABS: 50.0000 mg | ORAL_TABLET | Freq: Four times a day (QID) | ORAL | 0 refills | Status: DC | PRN

## 2021-05-03 MED ORDER — CYCLOBENZAPRINE HCL 5 MG PO TABS: 5.0000 mg | ORAL_TABLET | Freq: Three times a day (TID) | ORAL | 0 refills | Status: DC | PRN

## 2021-05-03 NOTE — Discharge Instructions (Signed)
Splint placed to the right ankle in office today  Crutches given  Paper prescription for rolling scooter also given today  I have sent in tramadol for you to take for breakthrough pain, 1 tablet every 6 hours as needed  I have sent in flexeril for you to take twice a day as needed for muscle spasms. This medication can make you sleepy. Do not drive or operate heavy machinery with this medication.  Follow-up with orthopedics

## 2021-05-03 NOTE — ED Provider Notes (Signed)
[facility]    CSN: [id number] Arrival date & time: 05/03/21  1129      History   Chief Complaint Chief Complaint  Patient presents with  . Ankle Pain    HPI Samantha Rosales is a 40 y.o. female.   Reports that she fell and tripped over her bed 4 nights ago.  States that she has not been taking anything for pain.  States that she has bruises all over her body from the fall.  Denies previous symptoms.  Denies numbness, tingling, temperature or skin color change to the extremity.  ROS per HPI  The history is provided by the patient.  Ankle Pain   Past Medical History:  Diagnosis Date  . Abnormal Pap smear   . Alcohol use   . Allergic rhinitis   . BV (bacterial vaginosis)   . GERD (gastroesophageal reflux disease)   . Hepatitis    LFT's >1000 (2019) due to methotrexate vs alcohol  . High cholesterol   . HSV-2 (herpes simplex virus 2) infection   . Hypertension   . Pancreatitis    12/19 (thought to be due to alcohol)  . Rheumatoid arthritis (HCC)   . Seizures (HCC)    dx as child with epilepsy but no seizures since 89 and on no meds  . Vaginal Pap smear, abnormal     Patient Active Problem List   Diagnosis Date Noted  . Bilateral optic neuropathy 03/22/2021  . Retinal edema 03/22/2021  . Abnormal auditory perception of both ears 03/21/2021  . Pancreatitis   . Hypertensive urgency 10/17/2019  . Hypokalemia 10/17/2019  . Hypomagnesemia 10/17/2019  . Alcohol abuse 10/17/2019  . Tobacco abuse 10/17/2019  . Hypothyroidism 10/17/2019  . Alcohol withdrawal (HCC) 10/17/2019  . Lactic acidosis 10/17/2019  . Confusion   . Abnormal LFTs 09/05/2018  . Elevated lipase 09/05/2018  . History of methotrexate therapy 09/05/2018  . High triglycerides 09/05/2018  . Elevated liver enzymes 07/01/2018  . Essential hypertension 07/01/2018  . Encounter for well woman exam with routine gynecological exam 07/01/2018  . History of abnormal cervical Pap smear  06/26/2017  . Encounter for surveillance of injectable contraceptive 06/26/2017  . Encounter for gynecological examination with Papanicolaou smear of cervix 06/26/2017  . High risk medication use 06/14/2015  . Rheumatoid arthritis involving multiple sites with positive rheumatoid factor (HCC) 02/24/2015  . Arthritis, rheumatic, acute or subacute 11/18/2013  . Urticaria, idiopathic 08/24/2013  . HSV-2 (herpes simplex virus 2) infection 05/29/2013  . HPV (human papilloma virus) infection 05/29/2013    Past Surgical History:  Procedure Laterality Date  . CERVICAL CONIZATION W/BX  12/20/2012   Procedure: CONIZATION CERVIX WITH BIOPSY;  Surgeon: Lazaro Arms, MD;  Location: [facility];  Service: Gynecology;  Laterality: N/A;  . HOLMIUM LASER APPLICATION  12/20/2012   Procedure: HOLMIUM LASER APPLICATION;  Surgeon: Lazaro Arms, MD;  Location: [facility];  Service: Gynecology;  Laterality: N/A;    OB History    Gravida  4   Para  2   Term  2   Preterm      AB  2   Living  2     SAB  1   IAB  1   Ectopic      Multiple      Live Births  2            Home Medications    Prior to Admission medications   Medication Sig Start Date End Date  Taking? Authorizing Provider  cyclobenzaprine (FLEXERIL) 5 MG tablet Take 1 tablet (5 mg total) by mouth 3 (three) times daily as needed for muscle spasms. 05/03/21  Yes Moshe Cipro, NP  ibuprofen (ADVIL) 600 MG tablet Take 1 tablet (600 mg total) by mouth every 6 (six) hours as needed. 05/03/21  Yes Moshe Cipro, NP  Misc. Devices (TRIO ROLLING WALKER) MISC 1 each by Does not apply route daily. 05/03/21  Yes Moshe Cipro, NP  traMADol (ULTRAM) 50 MG tablet Take 1 tablet (50 mg total) by mouth every 6 (six) hours as needed. 05/03/21  Yes Moshe Cipro, NP  Biotin 1000 MCG tablet Take 1,000 mcg by mouth daily.    [provider]  cycloSPORINE (RESTASIS) 0.05 % ophthalmic emulsion INSTILL 1 DROP INTO AFFECTED EYE  EVERY 12 HOURS 12/31/20   [provider]  escitalopram (LEXAPRO) 10 MG tablet TAKE 1 TABLET BY MOUTH EVERY DAY 01/17/21   Sturgeon, Velna Hatchet, MD  fenofibrate micronized (LOFIBRA) 200 MG capsule Take 1 capsule (200 mg total) by mouth at bedtime. 06/10/19   Danelle Berry, PA-C  fluticasone (FLONASE) 50 MCG/ACT nasal spray USE TWO SPRAY(S) IN EACH NOSTRIL ONCE DAILY Patient taking differently: as needed. 06/21/17   Donita Brooks, MD  folic acid (FOLVITE) 1 MG tablet Take 1 mg by mouth daily.    [provider]  HUMIRA PEN 40 MG/0.4ML PNKT Inject 40 mg into the skin every 14 (fourteen) days.  05/28/20   [provider]  Icosapent Ethyl (VASCEPA) 1 g CAPS Take 2 capsules (2 g total) by mouth 2 (two) times daily. 09/05/18   Donita Brooks, MD  medroxyPROGESTERone (DEPO-PROVERA) 150 MG/ML injection Inject 1 mL (150 mg total) into the muscle every 3 (three) months. INJECT 1 ML INTRAMUSCULARLY IN DOCTOR'S OFFICE EVERY 3 MONTHS. 04/19/21   Adline Potter, NP  nebivolol (BYSTOLIC) 10 MG tablet Take 1 tablet (10 mg total) by mouth daily. 04/05/21   Donita Brooks, MD  pantoprazole (PROTONIX) 40 MG tablet TAKE 1 TABLET BY MOUTH TWICE A DAY 12/31/19   Mansouraty, Netty Starring., MD  potassium chloride SA (KLOR-CON) 20 MEQ tablet Take 1 tablet (20 mEq total) by mouth daily. 04/01/21 05/01/21  Valentino Nose, NP  spironolactone (ALDACTONE) 25 MG tablet Take 1 tablet (25 mg total) by mouth daily. 04/05/21   Donita Brooks, MD  SUMAtriptan (IMITREX) 50 MG tablet Take 1 tablet (50 mg total) by mouth every 2 (two) hours as needed for migraine. May repeat in 2 hours if headache persists or recurs. 10/25/18 01/25/20  Donita Brooks, MD    Family History Family History  Problem Relation Age of Onset  . Hypertension Mother   . Hypertension Father   . Pancreatic cancer Paternal Uncle   . Colon cancer Neg Hx   . Esophageal cancer Neg Hx   . Inflammatory bowel disease Neg Hx   . Liver  disease Neg Hx   . Rectal cancer Neg Hx   . Stomach cancer Neg Hx     Social History Social History   Tobacco Use  . Smoking status: Current Every Day Smoker    Packs/day: 0.50    Years: 15.00    Pack years: 7.50    Types: Cigarettes  . Smokeless tobacco: Never Used  Vaping Use  . Vaping Use: Never used  Substance Use Topics  . Alcohol use: Yes    Alcohol/week: 2.0 standard drinks    Types: 2 Standard drinks or  equivalent per week    Comment: about 2 drinks per day  . Drug use: No     Allergies   Penicillins   Review of Systems Review of Systems   Physical Exam Triage Vital Signs ED Triage Vitals  Enc Vitals Group     BP 05/03/21 1226 (!) 148/102     Pulse Rate 05/03/21 1226 (!) 105     Resp 05/03/21 1226 17     Temp 05/03/21 1226 98.6 F (37 C)     Temp Source 05/03/21 1226 Oral     SpO2 05/03/21 1226 96 %     Weight --      Height --      Head Circumference --      Peak Flow --      Pain Score 05/03/21 1224 0     Pain Loc --      Pain Edu? --      Excl. in GC? --    No data found.  Updated Vital Signs BP (!) 148/102 (BP Location: Right Arm)   Pulse (!) 105   Temp 98.6 F (37 C) (Oral)   Resp 17   SpO2 96%    Physical Exam Vitals and nursing note reviewed.  Constitutional:      General: She is not in acute distress.    Appearance: Normal appearance. She is well-developed and normal weight.  HENT:     Head: Normocephalic and atraumatic.     Nose: Nose normal.     Mouth/Throat:     Mouth: Mucous membranes are moist.     Pharynx: Oropharynx is clear.  Eyes:     Extraocular Movements: Extraocular movements intact.     Conjunctiva/sclera: Conjunctivae normal.     Pupils: Pupils are equal, round, and reactive to light.  Cardiovascular:     Rate and Rhythm: Normal rate and regular rhythm.  Pulmonary:     Effort: Pulmonary effort is normal. No respiratory distress.  Musculoskeletal:        General: Swelling, tenderness, deformity and signs  of injury present.     Cervical back: Normal range of motion and neck supple.     Comments: Right lateral ankle swollen, erythematous, with ecchymosis, tender to touch, limited ROM  Skin:    General: Skin is warm and dry.     Capillary Refill: Capillary refill takes less than 2 seconds.     Findings: Bruising and erythema present.     Comments: Right lateral ankle   Neurological:     General: No focal deficit present.     Mental Status: She is alert and oriented to person, place, and time.  Psychiatric:        Mood and Affect: Mood normal.        Behavior: Behavior normal.        Thought Content: Thought content normal.      UC Treatments / Results  Labs (all labs ordered are listed, but only abnormal results are displayed) Labs Reviewed - No data to display  EKG   Radiology DG Ankle Complete Right  Result Date: 05/03/2021 CLINICAL DATA:  Fall 4 days ago.  Lateral ankle pain and swelling. EXAM: RIGHT ANKLE - COMPLETE 3+ VIEW COMPARISON:  None. FINDINGS: A minimally displaced oblique fracture of the distal fibular diaphysis is seen. No other fractures identified. No evidence of dislocation. Small plantar calcaneal bone spur incidentally noted. IMPRESSION: Minimally displaced oblique fracture of the distal fibular diaphysis. Electronically Signed   By:  Danae Orleans M.D.   On: 05/03/2021 12:42    Procedures Procedures (including critical care time)  Medications Ordered in UC Medications - No data to display  Initial Impression / Assessment and Plan / UC Course  I have reviewed the triage vital signs and the nursing notes.  Pertinent labs & imaging results that were available during my care of the patient were reviewed by me and considered in my medical decision making (see chart for details).    Right fibula fracture Right ankle pain Fall, initial encounter  Splint placed to the right ankle in the office today Crutches given Paper prescription for rolling scooter also  given May take 800 mg ibuprofen with 1000 mg of Tylenol.  Do not exceed 4000 mg of Tylenol in 24 hours. Tramadol prescribed for breakthrough pain I have sent in flexeril for you to take twice a day as needed for muscle spasms. This medication can make you sleepy. Do not drive or operate heavy machinery with this medication. Follow-up with orthopedics  Final Clinical Impressions(s) / UC Diagnoses   Final diagnoses:  Acute right ankle pain  Fall, initial encounter  Closed nondisplaced oblique fracture of shaft of right fibula, initial encounter     Discharge Instructions     Splint placed to the right ankle in office today  Crutches given  Paper prescription for rolling scooter also given today  I have sent in tramadol for you to take for breakthrough pain, 1 tablet every 6 hours as needed  I have sent in flexeril for you to take twice a day as needed for muscle spasms. This medication can make you sleepy. Do not drive or operate heavy machinery with this medication.  Follow-up with orthopedics    ED Prescriptions    Medication Sig Dispense Auth. Provider   cyclobenzaprine (FLEXERIL) 5 MG tablet Take 1 tablet (5 mg total) by mouth 3 (three) times daily as needed for muscle spasms. 30 tablet Moshe Cipro, NP   traMADol (ULTRAM) 50 MG tablet Take 1 tablet (50 mg total) by mouth every 6 (six) hours as needed. 15 tablet Moshe Cipro, NP   ibuprofen (ADVIL) 600 MG tablet Take 1 tablet (600 mg total) by mouth every 6 (six) hours as needed. 30 tablet Moshe Cipro, NP   Misc. Devices (TRIO ROLLING WALKER) MISC 1 each by Does not apply route daily. 1 each Moshe Cipro, NP     I have reviewed the PDMP during this encounter.   Moshe Cipro, NP 05/03/21 1313

## 2021-05-03 NOTE — ED Triage Notes (Signed)
Pain to top of RT foot and RT ankle after tripping and falling on Friday night.  Ambulatory with limp.

## 2021-05-04 DIAGNOSIS — Z419 Encounter for procedure for purposes other than remedying health state, unspecified: Secondary | ICD-10-CM | POA: Diagnosis not present

## 2021-06-03 DIAGNOSIS — Z419 Encounter for procedure for purposes other than remedying health state, unspecified: Secondary | ICD-10-CM | POA: Diagnosis not present

## 2021-06-07 ENCOUNTER — Encounter: Payer: Self-pay | Admitting: Family Medicine

## 2021-06-09 ENCOUNTER — Ambulatory Visit (INDEPENDENT_AMBULATORY_CARE_PROVIDER_SITE_OTHER): Payer: BC Managed Care – PPO | Admitting: Physician Assistant

## 2021-06-09 ENCOUNTER — Encounter: Payer: Self-pay | Admitting: Physician Assistant

## 2021-06-09 VITALS — BP 120/84 | HR 113 | Ht 68.0 in | Wt 118.1 lb

## 2021-06-09 DIAGNOSIS — K760 Fatty (change of) liver, not elsewhere classified: Secondary | ICD-10-CM

## 2021-06-09 DIAGNOSIS — F1010 Alcohol abuse, uncomplicated: Secondary | ICD-10-CM

## 2021-06-09 DIAGNOSIS — R748 Abnormal levels of other serum enzymes: Secondary | ICD-10-CM

## 2021-06-09 NOTE — Patient Instructions (Signed)
If you are age 41 or younger, your body mass index should be between 19-25. Your Body mass index is 17.96 kg/m. If this is out of the aformentioned range listed, please consider follow up with your Primary Care Provider.  __________________________________________________________  The [facility] providers would like to encourage you to use [facility] to communicate with providers for non-urgent requests or questions.  Due to long hold times on the telephone, sending your provider a message by [facility] may be a faster and more efficient way to get a response.  Please allow 48 business hours for a response.  Please remember that this is for non-urgent requests.   Your provider has requested that you go to the basement level for lab work work in 2 months (September). Press "B" on the elevator. The lab is located at the first door on the left as you exit the elevator.  Work on decreasing your alcohol intake.   Call the office in the next month or two to schedule a 3 month follow up with Hyacinth Meeker, PA-C  Thank you for entrusting me with your care and choosing [facility].  Hyacinth Meeker, PA-C

## 2021-06-09 NOTE — Progress Notes (Signed)
Chief Complaint: Elevated liver enzymes, frequent bowel movements  HPI:    Samantha Rosales is a 40 year old African-American female, known to Dr. Rush Landmark, with a past medical history of GERD and hepatitis in 2019 that due to methotrexate versus alcohol, who was referred to me by [name], MD for a complaint of evaded LFTs and frequent bowel movements.      09/03/2018 office visit with Dr. Rush Landmark for elevated LFTs.  At that time he been on methotrexate and also drinking.  Also had a full work-up.    10/21/2018 patient seen for follow-up of elevated liver enzymes and lipase.  At that time had recheck of hepatic function panel, iron panel and ferritin in 6 weeks scheduled.    05/29/2020 CT the abdomen pelvis with contrast with no radiographic evidence of pancreatitis, neoplasm or other acute finding, moderate hepatic steatosis.    03/29/2021 CMP with an AST of 339, ALT of 65, alk phos normal at 120, total bilirubin normal at 0.5.  Potassium low at 2.9.    04/01/2021 hepatitis panel with a reactive hepatitis A IgG and IgM antibody.    In the past it appears patient's abnormal liver enzymes have been mostly attributed to her alcohol use with question of methotrexate contributing in the past, but she is off this medicine now.    Today, the patient presents to clinic accompanied by her family member and tells me that she is doing fairly well.  She does drink 2-3 "beer seltzers" per day.  She has done this for many years, but is sometimes also drank more than that.  Denies abdominal pain.    Today patient discusses that she has sometimes 3 bowel movements a day which are typically within an hour of eating.  Tells me due to this oftentimes she will not eat because she does not want to have to use a bathroom while she is "out and about".  Denies abdominal pain or urgency.    Denies fever, chills, weight loss, blood in her stool, nausea, vomiting, heartburn or reflux  Past Medical History:  Diagnosis  Date  . Abnormal Pap smear   . Alcohol use   . Allergic rhinitis   . BV (bacterial vaginosis)   . GERD (gastroesophageal reflux disease)   . Hepatitis    LFT's >1000 (2019) due to methotrexate vs alcohol  . High cholesterol   . HSV-2 (herpes simplex virus 2) infection   . Hypertension   . Pancreatitis    12/19 (thought to be due to alcohol)  . Rheumatoid arthritis ([place])   . Seizures ([place])    dx as child with epilepsy but no seizures since 89 and on no meds  . Vaginal Pap smear, abnormal     Past Surgical History:  Procedure Laterality Date  . CERVICAL CONIZATION W/BX  12/20/2012   Procedure: CONIZATION CERVIX WITH BIOPSY;  Surgeon: Florian Buff, MD;  Location: [facility];  Service: Gynecology;  Laterality: N/A;  . HOLMIUM LASER APPLICATION  03/30/622   Procedure: HOLMIUM LASER APPLICATION;  Surgeon: Florian Buff, MD;  Location: [facility];  Service: Gynecology;  Laterality: N/A;    Current Outpatient Medications  Medication Sig Dispense Refill  . Biotin 1000 MCG tablet Take 1,000 mcg by mouth daily.    . cyclobenzaprine (FLEXERIL) 5 MG tablet Take 1 tablet (5 mg total) by mouth 3 (three) times daily as needed for muscle spasms. 30 tablet 0  . cycloSPORINE (RESTASIS) 0.05 % ophthalmic emulsion INSTILL 1  DROP INTO AFFECTED EYE EVERY 12 HOURS    . escitalopram (LEXAPRO) 10 MG tablet TAKE 1 TABLET BY MOUTH EVERY DAY 90 tablet 4  . fenofibrate micronized (LOFIBRA) 200 MG capsule Take 1 capsule (200 mg total) by mouth at bedtime. 90 capsule 3  . fluticasone (FLONASE) 50 MCG/ACT nasal spray USE TWO SPRAY(S) IN EACH NOSTRIL ONCE DAILY (Patient taking differently: as needed.) 16 g 11  . folic acid (FOLVITE) 1 MG tablet Take 1 mg by mouth daily.    Marland Kitchen HUMIRA PEN 40 MG/0.4ML PNKT Inject 40 mg into the skin every 14 (fourteen) days.     Marland Kitchen ibuprofen (ADVIL) 600 MG tablet Take 1 tablet (600 mg total) by mouth every 6 (six) hours as needed. 30 tablet 0  . Icosapent Ethyl (VASCEPA) 1 g CAPS Take 2  capsules (2 g total) by mouth 2 (two) times daily. 120 capsule 11  . medroxyPROGESTERone (DEPO-PROVERA) 150 MG/ML injection Inject 1 mL (150 mg total) into the muscle every 3 (three) months. INJECT 1 ML INTRAMUSCULARLY IN DOCTOR'S OFFICE EVERY 3 MONTHS. 1 mL 3  . Misc. Devices (FREE SPIRIT KNEE/LEG WALKER) MISC 1 each by Does not apply route daily. 1 each 0  . Misc. Devices (TRIO ROLLING WALKER) MISC 1 each by Does not apply route daily. 1 each 0  . nebivolol (BYSTOLIC) 10 MG tablet Take 1 tablet (10 mg total) by mouth daily. 90 tablet 3  . pantoprazole (PROTONIX) 40 MG tablet TAKE 1 TABLET BY MOUTH TWICE A DAY 60 tablet 0  . potassium chloride SA (KLOR-CON) 20 MEQ tablet Take 1 tablet (20 mEq total) by mouth daily. 90 tablet 0  . spironolactone (ALDACTONE) 25 MG tablet Take 1 tablet (25 mg total) by mouth daily. 30 tablet 3  . thiamine 100 MG tablet Take 100 mg by mouth 2 (two) times daily.    . traMADol (ULTRAM) 50 MG tablet Take 1 tablet (50 mg total) by mouth every 6 (six) hours as needed. 15 tablet 0  . valACYclovir (VALTREX) 1000 MG tablet Take 1 tablet by mouth in the morning and at bedtime.     No current facility-administered medications for this visit.    Allergies as of 06/09/2021 - Review Complete 06/09/2021  Allergen Reaction Noted  . Penicillins  12/13/2012    Family History  Problem Relation Age of Onset  . Hypertension Mother   . Hypertension Father   . Pancreatic cancer Paternal Uncle   . Colon cancer Neg Hx   . Esophageal cancer Neg Hx   . Inflammatory bowel disease Neg Hx   . Liver disease Neg Hx   . Rectal cancer Neg Hx   . Stomach cancer Neg Hx     Social History   Socioeconomic History  . Marital status: Married    Spouse name: Not on file  . Number of children: 2  . Years of education: Not on file  . Highest education level: Not on file  Occupational History  . 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 Smoking status: Every Day    Packs/day: 0.50    Years: 15.00    Pack years: 7.50    Types: Cigarettes  . Smokeless tobacco: Never  Vaping Use  . Vaping Use: Never used  Substance and Sexual Activity  . Alcohol use: Yes    Alcohol/week: 2.0 standard drinks    Types: 2 Standard drinks or equivalent per week  Comment: about 2 drinks per day  . Drug use: No  . Sexual activity: Yes    Birth control/protection: Injection    Comment: Depo Provera every 3 months  Other Topics Concern  . Not on file  Social History Narrative  . Not on file   Social Determinants of Health   Financial Resource Strain: Not on file  Food Insecurity: Not on file  Transportation Needs: Not on file  Physical Activity: Not on file  Stress: Not on file  Social Connections: Not on file  Intimate Partner Violence: Not on file    Review of Systems:    Constitutional: No weight loss, fever or chills Skin: No rash Cardiovascular: No chest pain Respiratory: No SOB  Gastrointestinal: See HPI and otherwise negative Genitourinary: No dysuria  Neurological: No headache, dizziness or syncope Musculoskeletal: No new muscle or joint pain Hematologic: No bleeding Psychiatric: No history of depression or anxiety   Physical Exam:  Vital signs: BP 120/84 (BP Location: Left Arm, Patient Position: Sitting, Cuff Size: Normal)   Pulse (!) 113   Ht 5' 8"  (1.727 m)   Wt 118 lb 2 oz (53.6 kg)   SpO2 100%   BMI 17.96 kg/m   Constitutional:   Pleasant thin appearing AA female appears to be in NAD, Well developed, Well nourished, alert and cooperative Head:  Normocephalic and atraumatic. Eyes:   PEERL, EOMI. No icterus. Conjunctiva pink. Ears:  Normal auditory acuity. Neck:  Supple Throat: Oral cavity and pharynx without inflammation, swelling or lesion.  Respiratory: Respirations even and unlabored. Lungs clear to auscultation bilaterally.   No wheezes, crackles, or rhonchi.  Cardiovascular: Normal S1, S2. No  MRG. Regular rate and rhythm. No peripheral edema, cyanosis or pallor.  Gastrointestinal:  Soft, nondistended, nontender. No rebound or guarding. Normal bowel sounds. No appreciable masses or hepatomegaly. Rectal:  Not performed.  Msk:  Symmetrical without gross deformities. Without edema, no deformity or joint abnormality.  Neurologic:  Alert and  oriented x4;  grossly normal neurologically.  Skin:   Dry and intact without significant lesions or rashes. Psychiatric: Demonstrates good judgement and reason without abnormal affect or behaviors.  RELEVANT LABS AND IMAGING: CBC    Component Value Date/Time   WBC 3.9 04/15/2020 1003   RBC 3.61 (L) 04/15/2020 1003   HGB 12.1 04/15/2020 1003   HCT 35.8 04/15/2020 1003   PLT 160 04/15/2020 1003   MCV 99.2 04/15/2020 1003   MCH 33.5 (H) 04/15/2020 1003   MCHC 33.8 04/15/2020 1003   RDW 12.4 04/15/2020 1003   LYMPHSABS 2,129 04/15/2020 1003   MONOABS 0.4 10/16/2019 1717   EOSABS 20 04/15/2020 1003   BASOSABS 20 04/15/2020 1003    CMP     Component Value Date/Time   NA 143 03/29/2021 0839   K 2.9 (L) 03/29/2021 0839   CL 102 03/29/2021 0839   CO2 24 03/29/2021 0839   GLUCOSE 84 03/29/2021 0839   BUN 3 (L) 03/29/2021 0839   CREATININE 0.56 03/29/2021 0839   CALCIUM 8.7 03/29/2021 0839   PROT 7.2 03/29/2021 0839   ALBUMIN 4.2 10/17/2019 0530   AST 339 (H) 03/29/2021 0839   ALT 65 (H) 03/29/2021 0839   ALKPHOS 99 10/17/2019 0530   BILITOT 0.5 03/29/2021 0839   GFRNONAA 117 03/29/2021 0839   GFRAA 135 03/29/2021 0839    Assessment: 1.  Elevated LFTs: Full work-up in the past with Dr. Rush Landmark, thought related to methotrexate but patient is on this anymore, likely due  to alcohol abuse, patient reports 2-3 beers a day and has done this for years, also fatty liver on CT which is likely contributing 2.  Fatty liver 3.  Alcohol abuse  Plan: 1.  Discussed with patient that we have done a full work-up of her liver enzymes in the  past, she does have fatty changes of her liver.  Discussed that most likely since she is no longer on Methotrexate these changes in liver enzymes are related to alcohol abuse.  Explained that 2-3 beers a day is too much.  Recommend that the patient decrease and eventually stop drinking alcohol on a regular basis.  She is willing to trial this. 2.  Scheduled patient for a recheck of her LFTs in 2 months.  Explained that if she is able to decrease/stop drinking we should see a dramatic change in these. 3.  Discussed patient's bowel movements, 3 times a day can be normal as long as she has no urgency or diarrhea. 4.  Patient to follow in clinic with me in 3 months.  Ellouise Newer, PA-C [facility] 06/09/2021, 9:09 AM  Cc: [name], MD

## 2021-06-09 NOTE — Progress Notes (Signed)
Attending Physician's Attestation   I have reviewed the chart.   I agree with the Advanced Practitioner's note, impression, and recommendations with any updates as below. If LFTs remain elevated af period of alcohol cessation, liver biopsy will be recommended.   Corliss Parish, MD [facility] Advanced Endoscopy Office # [phone]

## 2021-06-30 ENCOUNTER — Telehealth: Payer: Self-pay | Admitting: *Deleted

## 2021-06-30 NOTE — Telephone Encounter (Signed)
Received call from [place], [place] with Dr Maple Hudson.   Reports that patient had labs done at their office and noted LFT's are elevated. States that patient is prescribed Humira from their office, but this should not cause increase in LFT's.   States that she will fax labs to provider for review.

## 2021-07-01 ENCOUNTER — Telehealth: Payer: Self-pay

## 2021-07-01 NOTE — Telephone Encounter (Signed)
Call placed to patient and patient made aware.   Appointment scheduled.  

## 2021-07-04 DIAGNOSIS — Z419 Encounter for procedure for purposes other than remedying health state, unspecified: Secondary | ICD-10-CM | POA: Diagnosis not present

## 2021-07-05 ENCOUNTER — Other Ambulatory Visit: Payer: Self-pay

## 2021-07-05 ENCOUNTER — Encounter: Payer: Self-pay | Admitting: Family Medicine

## 2021-07-05 ENCOUNTER — Ambulatory Visit (INDEPENDENT_AMBULATORY_CARE_PROVIDER_SITE_OTHER): Payer: BC Managed Care – PPO | Admitting: Family Medicine

## 2021-07-05 VITALS — BP 132/68 | HR 100 | Temp 98.6°F | Resp 16 | Ht 68.0 in | Wt 122.0 lb

## 2021-07-05 DIAGNOSIS — K759 Inflammatory liver disease, unspecified: Secondary | ICD-10-CM | POA: Diagnosis not present

## 2021-07-05 NOTE — Progress Notes (Signed)
Subjective:    Patient ID: Samantha Rosales, female    DOB: 10-14-1981, 40 y.o.   MRN: [id number]  HPI  06/2018 Patient has a history of mild elevations in her liver function test as evidenced on her blood work in October of last year.  Recently was found to have significant elevations in her liver function test with an AST greater than 1000 and an ALT greater than 800.  There was also mild elevations in her alkaline phosphatase, her bilirubin was greater than 3 and she had a mildly elevated lipase.  She was referred to the emergency room where right upper quadrant ultrasound was negative for any evidence of bile duct obstruction.  Viral hepatitis serologies were negative.  She was instructed to follow-up with her PCP and is here today for follow-up.  I had already recommended that the patient discontinue fenofibrate immediately.  Of note she is also taking methotrexate for rheumatoid arthritis.  She has been on this medication for years.  She denies any excessive alcohol use.  She states that she may drink a glass of wine occasionally.  She denies any other hepatotoxic medication use such as Tylenol.  At that time, my plan was: Given the severity of her liver function test elevation, I am concerned about medication induced hepatotoxicity from methotrexate versus autoimmune hepatitis.  Repeat CMP today.  If liver function test remains significantly elevated, I will consult GI.  I have asked the patient to discontinue methotrexate altogether and I will notify her rheumatologist, Dr. Dierdre Forth of the significant elevation in her liver function test.  I would like to repeat liver function test in 2 weeks after discontinuing methotrexate.  I will also begin work-up for autoimmune hepatitis but checking an ANA, and antimitochondrial antibody, and anti-liver kidney antibody.  Spent approximately 25 minutes with the patient discussing her lab test, diagnosis and work up.    11/14/19 Elevations in liver function  test were thought to be due to methotrexate and the medication was discontinued.  However in December of last year, the patient was seen in the emergency room for abdominal pain and was found to have acute pancreatitis.  Patient states that this was thought to be due to alcohol.  Patient was recently admitted to the hospital with delirium.  She was having tremor.  She was confused and lethargic.  Her blood alcohol level was undetectable.  Urine drug screen was negative.  Head CT was normal.  Lab work was significant for mild anemia, mild elevations in liver function test.  Diagnosis was possible alcohol withdrawal coupled with hypertensive urgency.  Patient is here today for follow-up.  She states that the day before she was hospitalized, she had had "5 shots".  She had not drank anything the day that she was hospitalized.  She has no recollection of what happened.  Her daughter found her shaking and confused.  EMS was called.  Since discharge from the hospital, she states that she is drinking occasionally.  She states maybe she is drinking 3 shots of alcohol a day.  She states that she has lost her insurance and she has not been taking Humira for her RA.  As result she has terrible back pain and is having muscle spasms in her back for which she is requesting pain medication.  Blood pressure today is much better at 114/72.  Lab work in the hospital was concerning for subclinical hypothyroidism.  At that time, my plan was: Reviewing her past records, I now  believe that the elevated liver function test may have been alcohol induced hepatitis coupled with methotrexate.  She then had an episode of pancreatitis that she admits was due to alcohol.  I believe that most recently she was experiencing possible delirium tremens or possible alcohol withdrawal syndrome.  I question the veracity of her reported alcohol use.  I will check a level today.  I have strongly suggested an outpatient treatment program such as fellowship  I will or alcoholics anonymous.  I believe she needs physician monitored alcohol withdrawal to prevent delirium tremens and therefore I have recommended fellowship I will.  Patient is somewhat in denial and is precontemplative.  I will recheck her lab work today and also monitor her subclinical hypothyroidism.  I will give the patient Zanaflex 4 mg tablets every 6 hours as needed for muscle spasms in her back.  I strongly cautioned the patient not to mix this with any mood altering substances or alcohol due to the sedative effect.  04/15/20 Since I last saw the patient, she has lost 13 pounds.  She states that she has very little appetite.  At other times she has abdominal discomfort in the upper abdomen.  She has seen no further episodes of hematochezia.  She denies any melena.  However she reports poor appetite, nausea, and early satiety.  Her blood pressure today is extremely high at 184/110.  The patient stopped her medication because she was concerned that her medicines were making her not want to eat.  She has been off her medication for approximately 1 month however the symptoms have not improved.  The only medication that she is taking is her Humira.  She is still drinking however she states that she is drinking very little.  She denies any heavy drinking.  She denies any chest pain shortness of breath or dyspnea on exertion.  She denies any dysuria or hematuria.  She denies any diarrhea.  Today was supposed to be a complete physical exam however given the weight loss and her blood pressure and we focused on her symptoms rather than performing a physical exam  07/05/21 Recently received word from her rheumatologist that her liver function tests were elevated.  She is no longer on methotrexate and is currently on Humira for rheumatoid arthritis.  She does have a history of heavy alcohol use and had an episode of pancreatitis requiring hospital admission in the past.  This has been the assumption as to the  cause of her elevated liver function test.  She is even seeing gastroenterology.  Approximately 3 weeks ago they recommended that she try to wean away from alcohol which she has been slowly doing.  She states that she is currently drinking 1 beer a day.  She does have a rosacea like rash on her cheeks and on her nasal bridge which she states has been attributed to rosacea in the past.  He is also still on fenofibrate.  2 years ago she had negative anti-smooth muscle antibodies and negative antiliver kidney microsomal antibodies and an evaluation for autoimmune hepatitis given that she had a positive ANA. Past Medical History:  Diagnosis Date   Abnormal Pap smear    Alcohol use    Allergic rhinitis    BV (bacterial vaginosis)    GERD (gastroesophageal reflux disease)    Hepatitis    LFT's >1000 (2019) due to methotrexate vs alcohol   High cholesterol    HSV-2 (herpes simplex virus 2) infection    Hypertension  Pancreatitis    12/19 (thought to be due to alcohol)   Rheumatoid arthritis (HCC)    Seizures (HCC)    dx as child with epilepsy but no seizures since 89 and on no meds   Vaginal Pap smear, abnormal    Past Surgical History:  Procedure Laterality Date   CERVICAL CONIZATION W/BX  12/20/2012   Procedure: CONIZATION CERVIX WITH BIOPSY;  Surgeon: Lazaro Arms, MD;  Location: [facility];  Service: Gynecology;  Laterality: N/A;   HOLMIUM LASER APPLICATION  12/20/2012   Procedure: HOLMIUM LASER APPLICATION;  Surgeon: Lazaro Arms, MD;  Location: [facility];  Service: Gynecology;  Laterality: N/A;   Current Outpatient Medications on File Prior to Visit  Medication Sig Dispense Refill   Biotin 1000 MCG tablet Take 1,000 mcg by mouth daily.     cycloSPORINE (RESTASIS) 0.05 % ophthalmic emulsion INSTILL 1 DROP INTO AFFECTED EYE EVERY 12 HOURS     escitalopram (LEXAPRO) 10 MG tablet TAKE 1 TABLET BY MOUTH EVERY DAY 90 tablet 4   fenofibrate micronized (LOFIBRA) 200 MG capsule Take 1 capsule (200  mg total) by mouth at bedtime. 90 capsule 3   fluticasone (FLONASE) 50 MCG/ACT nasal spray USE TWO SPRAY(S) IN EACH NOSTRIL ONCE DAILY (Patient taking differently: as needed.) 16 g 11   folic acid (FOLVITE) 1 MG tablet Take 1 mg by mouth daily.     HUMIRA PEN 40 MG/0.4ML PNKT Inject 40 mg into the skin every 14 (fourteen) days.      ibuprofen (ADVIL) 600 MG tablet Take 1 tablet (600 mg total) by mouth every 6 (six) hours as needed. 30 tablet 0   Icosapent Ethyl (VASCEPA) 1 g CAPS Take 2 capsules (2 g total) by mouth 2 (two) times daily. 120 capsule 11   medroxyPROGESTERone (DEPO-PROVERA) 150 MG/ML injection Inject 1 mL (150 mg total) into the muscle every 3 (three) months. INJECT 1 ML INTRAMUSCULARLY IN DOCTOR'S OFFICE EVERY 3 MONTHS. 1 mL 3   methocarbamol (ROBAXIN) 750 MG tablet Take 750 mg by mouth every 8 (eight) hours as needed for muscle spasms.     nebivolol (BYSTOLIC) 10 MG tablet Take 1 tablet (10 mg total) by mouth daily. 90 tablet 3   pantoprazole (PROTONIX) 40 MG tablet TAKE 1 TABLET BY MOUTH TWICE A DAY 60 tablet 0   potassium chloride SA (KLOR-CON) 20 MEQ tablet Take 1 tablet (20 mEq total) by mouth daily. 90 tablet 0   spironolactone (ALDACTONE) 25 MG tablet Take 1 tablet (25 mg total) by mouth daily. 30 tablet 3   thiamine 100 MG tablet Take 100 mg by mouth 2 (two) times daily.     traMADol (ULTRAM) 50 MG tablet Take 1 tablet (50 mg total) by mouth every 6 (six) hours as needed. 15 tablet 0   valACYclovir (VALTREX) 1000 MG tablet Take 1 tablet by mouth in the morning and at bedtime.     No current facility-administered medications on file prior to visit.   Allergies  Allergen Reactions   Penicillins     Has patient had a PCN reaction causing immediate rash, facial/tongue/throat swelling, SOB or lightheadedness with hypotension: Unknown Has patient had a PCN reaction causing severe rash involving mucus membranes or skin necrosis: Unknown Has patient had a PCN reaction that  required hospitalization: Unknown Has patient had a PCN reaction occurring within the last 10 years: No If all of the above answers are "NO", then may proceed with Cephalosporin use.    Social  History   Socioeconomic History   Marital status: Married    Spouse name: Not on file   Number of children: 2   Years of education: Not on file   Highest education level: Not on file  Occupational History   Occupation: [employer]    Employer: [employer]   Tobacco Use   Smoking status: Every Day    Packs/day: 0.50    Years: 15.00    Pack years: 7.50    Types: Cigarettes   Smokeless tobacco: Never  Vaping Use   Vaping Use: Never used  Substance and Sexual Activity   Alcohol use: Yes    Alcohol/week: 2.0 standard drinks    Types: 2 Standard drinks or equivalent per week    Comment: about 2 drinks per day   Drug use: No   Sexual activity: Yes    Birth control/protection: Injection    Comment: Depo Provera every 3 months  Other Topics Concern   Not on file  Social History Narrative   Not on file   Social Determinants of Health   Financial Resource Strain: Not on file  Food Insecurity: Not on file  Transportation Needs: Not on file  Physical Activity: Not on file  Stress: Not on file  Social Connections: Not on file  Intimate Partner Violence: Not on file   Family History  Problem Relation Age of Onset   Hypertension Mother    Hypertension Father    Pancreatic cancer Paternal Uncle    Colon cancer Neg Hx    Esophageal cancer Neg Hx    Inflammatory bowel disease Neg Hx    Liver disease Neg Hx    Rectal cancer Neg Hx    Stomach cancer Neg Hx       Review of Systems     Objective:   Physical Exam Constitutional:      Appearance: She is underweight. She is not ill-appearing or diaphoretic.  Cardiovascular:     Rate and Rhythm: Normal rate and regular rhythm.     Heart sounds: Normal heart sounds.  Pulmonary:     Effort: Pulmonary effort  is normal.     Breath sounds: Normal breath sounds.  Abdominal:     General: Bowel sounds are normal. There is no distension.     Palpations: Abdomen is soft.     Tenderness: There is no abdominal tenderness.  Skin:    Findings: Rash present.  Neurological:     Mental Status: She is alert.          Assessment & Plan:  Hepatitis - Plan: CBC with Differential/Platelet, COMPLETE METABOLIC PANEL WITH GFR, Sedimentation rate, ANA, AntiMicrosomal Ab-Liver / Kidney, Anti-Smooth Muscle Antibody, IGG Patient states that she has lost weight due to the fact that every time she eats she becomes nauseated and has diarrhea.  I believe that this is likely a sign of pancreatic insufficiency due to underlying chronic pancreatitis.  Repeat CBC CMP today.  If liver function test are elevated, obtain an ultrasound of the liver to rule out malignancy in the liver.  Also check sed rate ANA antiliver kidney microsomal antibody and also anti-smooth muscle antibody to rule out autoimmune hepatitis.  Discontinue fenofibrate.  Depending upon lab results, likely will start the patient on pancreatic enzyme supplementation.

## 2021-07-05 NOTE — Telephone Encounter (Signed)
error 

## 2021-07-07 LAB — SEDIMENTATION RATE: Sed Rate: 72 mm/h — ABNORMAL HIGH (ref 0–20)

## 2021-07-07 LAB — CBC WITH DIFFERENTIAL/PLATELET
Absolute Monocytes: 333 cells/uL (ref 200–950)
Basophils Absolute: 21 cells/uL (ref 0–200)
Basophils Relative: 0.4 %
Eosinophils Absolute: 21 cells/uL (ref 15–500)
Eosinophils Relative: 0.4 %
HCT: 30 % — ABNORMAL LOW (ref 35.0–45.0)
Hemoglobin: 10.2 g/dL — ABNORMAL LOW (ref 11.7–15.5)
Lymphs Abs: 1534 cells/uL (ref 850–3900)
MCH: 33.1 pg — ABNORMAL HIGH (ref 27.0–33.0)
MCHC: 34 g/dL (ref 32.0–36.0)
MCV: 97.4 fL (ref 80.0–100.0)
MPV: 12.3 fL (ref 7.5–12.5)
Monocytes Relative: 6.4 %
Neutro Abs: 3292 cells/uL (ref 1500–7800)
Neutrophils Relative %: 63.3 %
Platelets: 136 10*3/uL — ABNORMAL LOW (ref 140–400)
RBC: 3.08 10*6/uL — ABNORMAL LOW (ref 3.80–5.10)
RDW: 13.6 % (ref 11.0–15.0)
Total Lymphocyte: 29.5 %
WBC: 5.2 10*3/uL (ref 3.8–10.8)

## 2021-07-07 LAB — ANTI-SMOOTH MUSCLE ANTIBODY, IGG: Actin (Smooth Muscle) Antibody (IGG): <20 U (ref <20)

## 2021-07-07 LAB — COMPLETE METABOLIC PANEL WITH GFR
AG Ratio: 1.4 (calc) (ref 1.0–2.5)
ALT: 41 U/L — ABNORMAL HIGH (ref 6–29)
AST: 101 U/L — ABNORMAL HIGH (ref 10–30)
Albumin: 4.1 g/dL (ref 3.6–5.1)
Alkaline phosphatase (APISO): 156 U/L — ABNORMAL HIGH (ref 31–125)
BUN: 8 mg/dL (ref 7–25)
CO2: 26 mmol/L (ref 20–32)
Calcium: 8.5 mg/dL — ABNORMAL LOW (ref 8.6–10.2)
Chloride: 100 mmol/L (ref 98–110)
Creat: 0.58 mg/dL (ref 0.50–0.99)
Globulin: 3 g/dL (calc) (ref 1.9–3.7)
Glucose, Bld: 88 mg/dL (ref 65–99)
Potassium: 3 mmol/L — ABNORMAL LOW (ref 3.5–5.3)
Sodium: 138 mmol/L (ref 135–146)
Total Bilirubin: 1.3 mg/dL — ABNORMAL HIGH (ref 0.2–1.2)
Total Protein: 7.1 g/dL (ref 6.1–8.1)
eGFR: 117 mL/min/{1.73_m2} (ref >=60)

## 2021-07-07 LAB — ANTI-MICROSOMAL ANTIBODY LIVER / KIDNEY: LKM1 Ab: <=20 U (ref <=20.0)

## 2021-07-07 LAB — ANA: Anti Nuclear Antibody (ANA): NEGATIVE

## 2021-07-08 ENCOUNTER — Other Ambulatory Visit: Payer: Self-pay | Admitting: *Deleted

## 2021-07-14 ENCOUNTER — Other Ambulatory Visit: Payer: Self-pay

## 2021-07-14 ENCOUNTER — Ambulatory Visit (INDEPENDENT_AMBULATORY_CARE_PROVIDER_SITE_OTHER): Payer: BC Managed Care – PPO | Admitting: *Deleted

## 2021-07-14 DIAGNOSIS — Z3042 Encounter for surveillance of injectable contraceptive: Secondary | ICD-10-CM

## 2021-07-14 MED ORDER — MEDROXYPROGESTERONE ACETATE 150 MG/ML IM SUSP
150.0000 mg | Freq: Once | INTRAMUSCULAR | Status: AC
  Administered 2021-07-14: 150 mg via INTRAMUSCULAR

## 2021-07-14 NOTE — Progress Notes (Signed)
   NURSE VISIT- INJECTION  SUBJECTIVE:  Samantha Rosales is a 40 y.o. [id number] female here for a Depo Provera for contraception/period management. She is a GYN patient.   OBJECTIVE:  There were no vitals taken for this visit.  Appears well, in no apparent distress  Injection administered in: Left deltoid  Meds ordered this encounter  Medications   medroxyPROGESTERone (DEPO-PROVERA) injection 150 mg    ASSESSMENT: GYN patient Depo Provera for contraception/period management PLAN: Follow-up: in 11-13 weeks for next Depo   Jobe Marker  07/14/2021 9:35 AM

## 2021-08-04 DIAGNOSIS — Z419 Encounter for procedure for purposes other than remedying health state, unspecified: Secondary | ICD-10-CM | POA: Diagnosis not present

## 2021-08-18 ENCOUNTER — Other Ambulatory Visit: Payer: BC Managed Care – PPO

## 2021-08-18 ENCOUNTER — Ambulatory Visit
Admission: RE | Admit: 2021-08-18 | Discharge: 2021-08-18 | Disposition: A | Discharge status: Home / Self Care | Payer: BC Managed Care – PPO | Source: Ambulatory Visit | Attending: Nurse Practitioner | Admitting: Nurse Practitioner

## 2021-08-18 DIAGNOSIS — R748 Abnormal levels of other serum enzymes: Secondary | ICD-10-CM

## 2021-08-18 IMAGING — US US ABDOMEN LIMITED
1 series · 14 of 25 positions shown · non-contrast
Comparison: None.

CLINICAL DATA: Elevated liver enzymes.

EXAM:
ULTRASOUND ABDOMEN LIMITED RIGHT UPPER QUADRANT

[Series 1: us abdomen limited · 0.22mm/px · 14 of 67 slices shown]
[im 1/67]
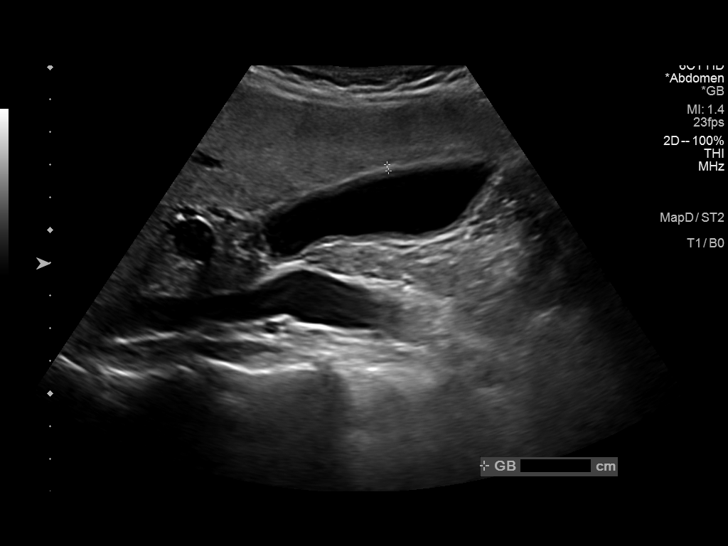
[im 6/67]
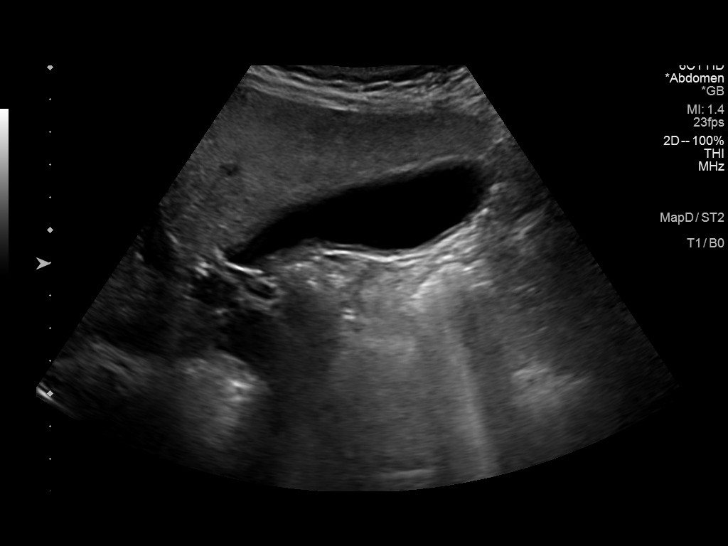
[im 12/67]
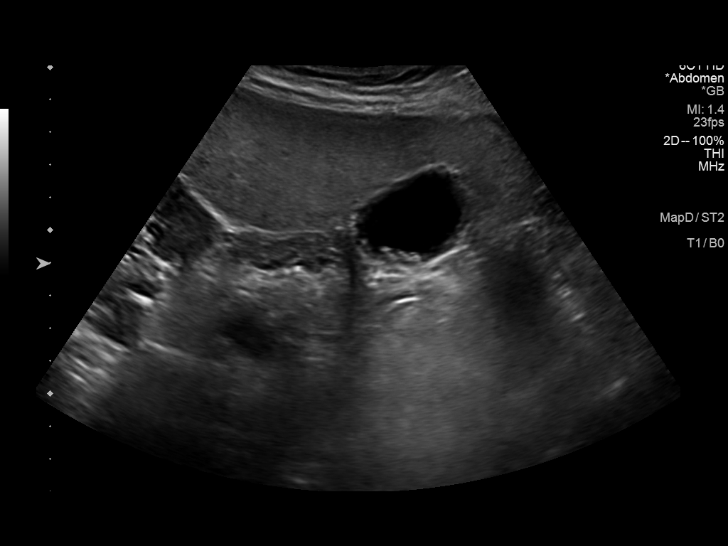
[im 17/67]
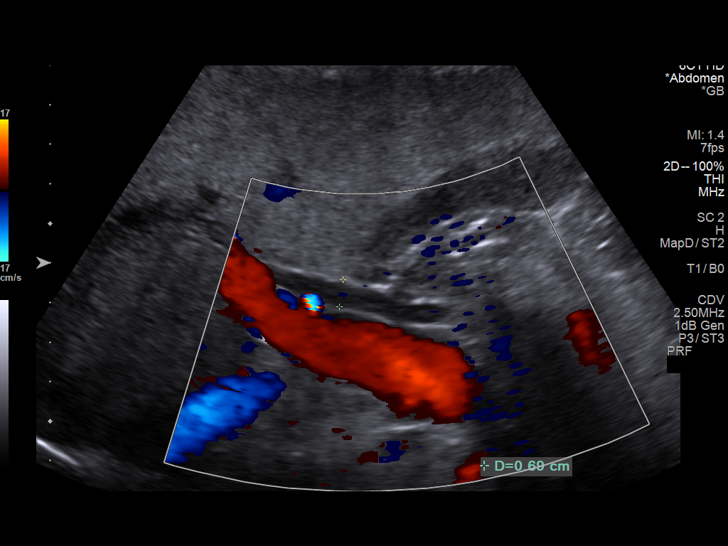
[im 23/67]
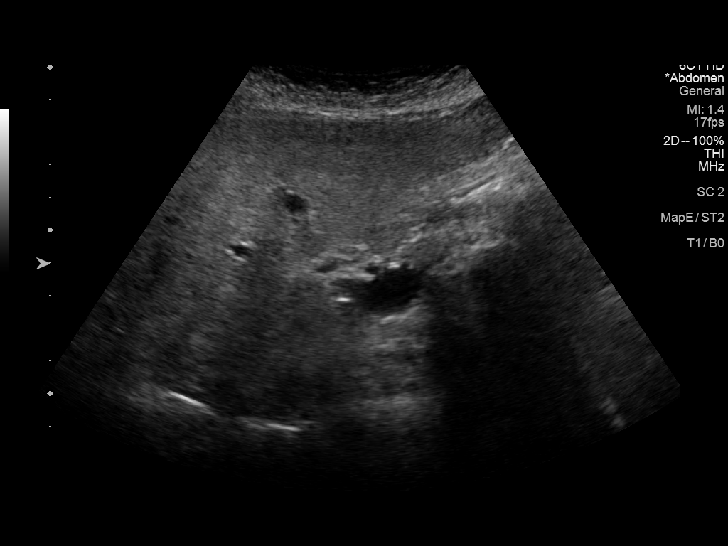
[im 25/67]
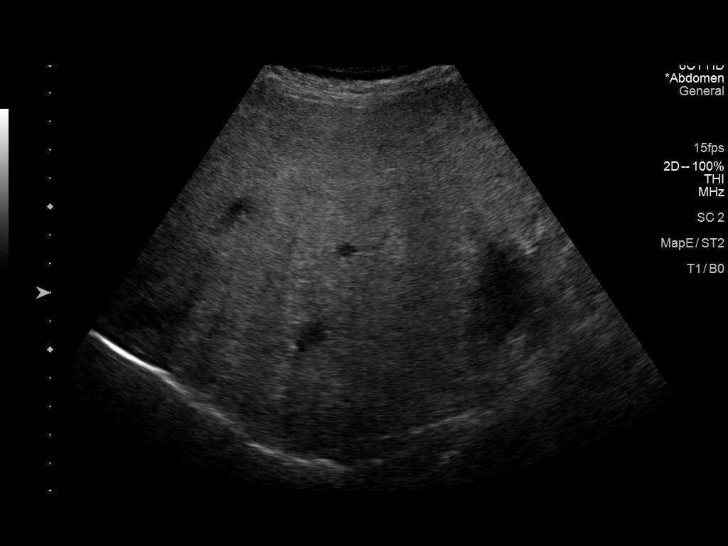
[im 31/67]
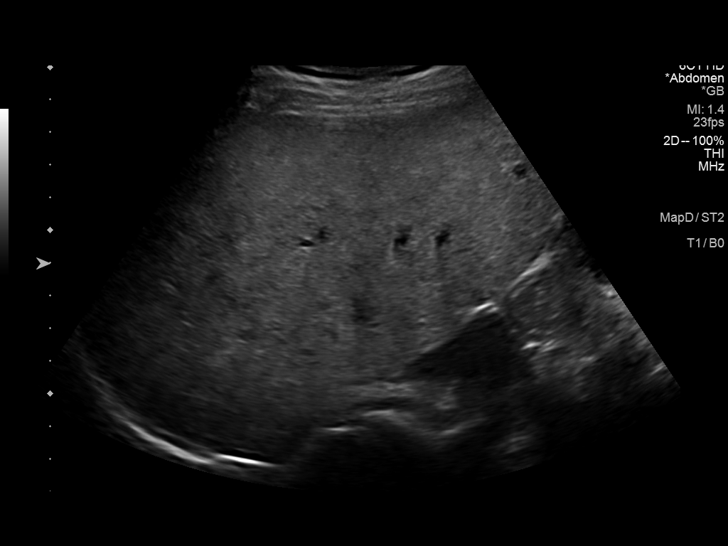
[im 36/67]
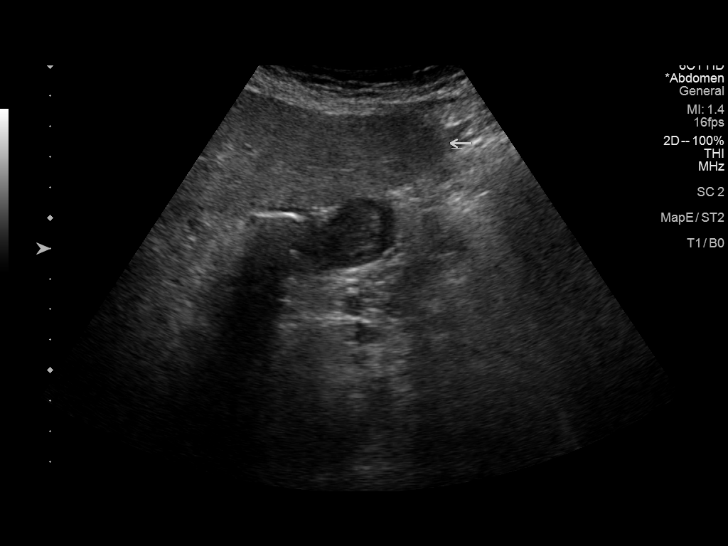
[im 42/67]
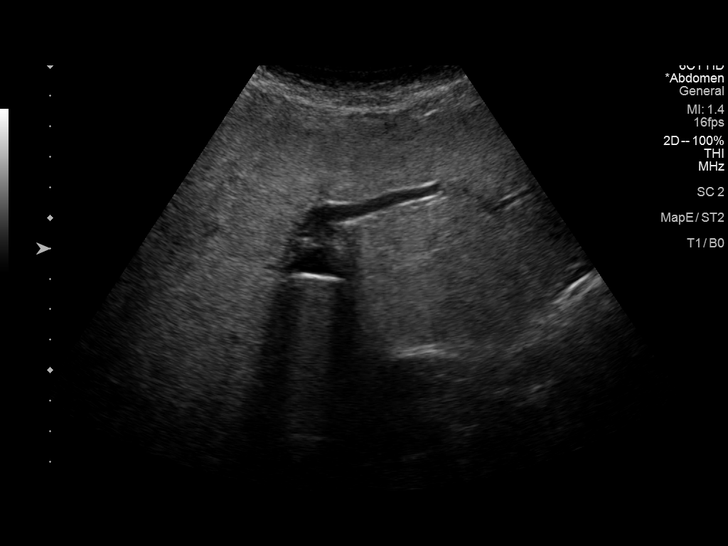
[im 45/67]
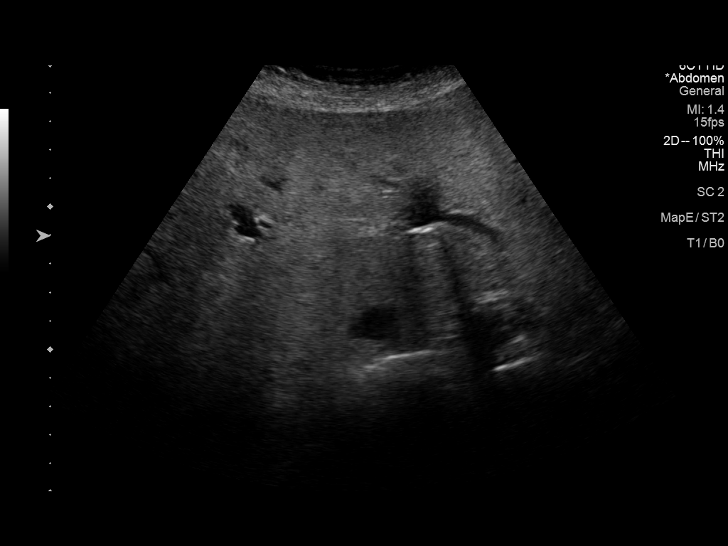
[im 50/67]
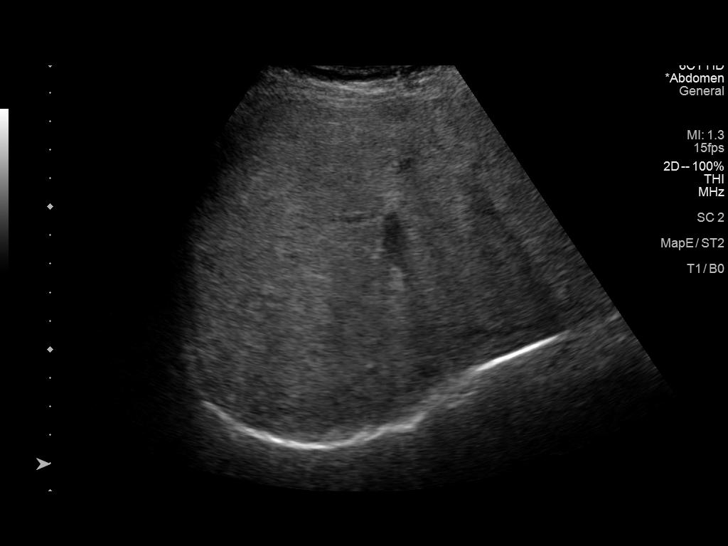
[im 56/67]
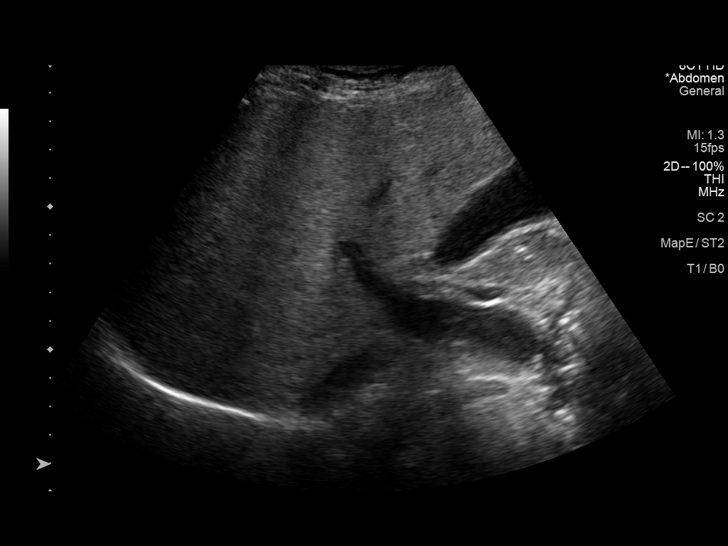
[im 61/67]
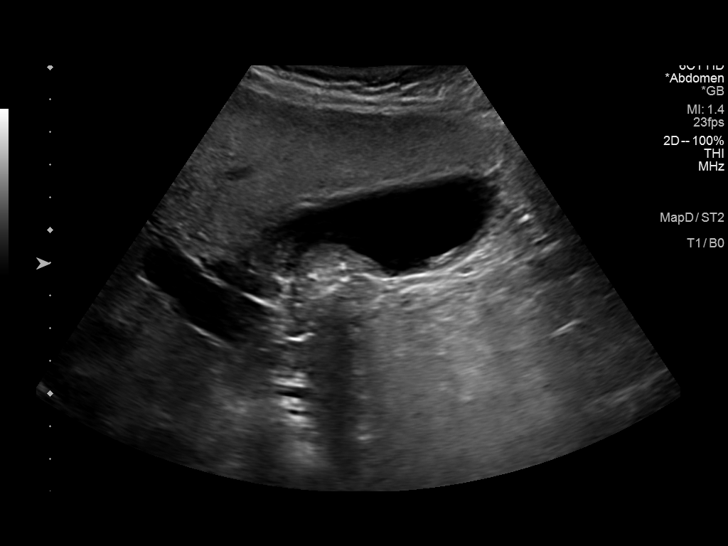
[im 67/67]
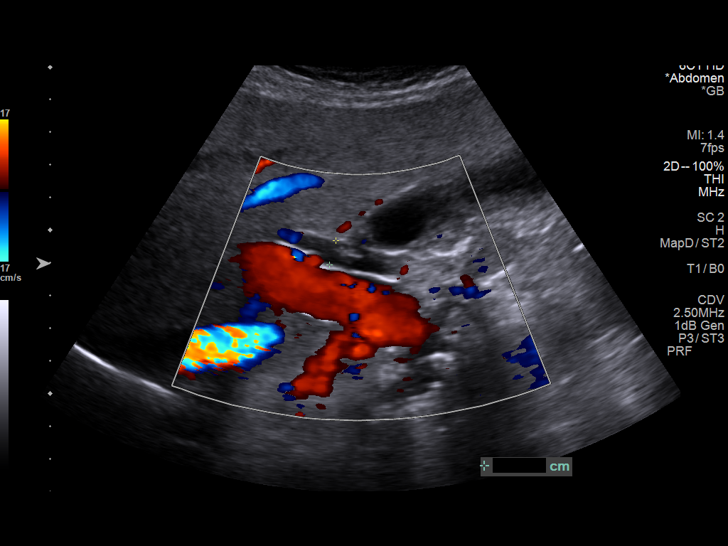

[14 of 25 positions shown; findings below may reference images not displayed]

FINDINGS: Gallbladder:

A 4.6 mm nonshadowing echogenic focus is seen along gallbladder
wall. No gallstones or wall thickening visualized (2.0 mm). No
sonographic Murphy sign noted by sonographer.

Common bile duct:

Diameter: 7.6 mm An ill-defined echogenic focus is seen within the
lumen of the common bile duct.

Liver:

No focal lesion identified. The liver parenchyma is lobulated in
contour and diffusely increased in echogenicity. Portal vein is
patent on color Doppler imaging with normal direction of blood flow
towards the liver.

Other: None.
IMPRESSION: 1. 4 mm gallbladder polyp, likely benign. No additional follow-up is
recommended. This recommendation follows ACR consensus guidelines:
White Paper of the ACR Incidental Findings Committee II on
Gallbladder and Biliary Findings. [HOSPITAL] [L3]:;[DATE].
2. Echogenic focus within the common bile duct lumen which may
represent a small gallstone. MRCP is recommended to exclude the
presence of a small intraluminal mass.
3. Hepatic steatosis.

## 2021-08-19 ENCOUNTER — Encounter: Payer: Self-pay | Admitting: Nurse Practitioner

## 2021-08-19 ENCOUNTER — Other Ambulatory Visit: Payer: Self-pay | Admitting: Nurse Practitioner

## 2021-08-19 DIAGNOSIS — K760 Fatty (change of) liver, not elsewhere classified: Secondary | ICD-10-CM

## 2021-08-19 DIAGNOSIS — K839 Disease of biliary tract, unspecified: Secondary | ICD-10-CM

## 2021-09-03 DIAGNOSIS — Z419 Encounter for procedure for purposes other than remedying health state, unspecified: Secondary | ICD-10-CM | POA: Diagnosis not present

## 2021-09-07 ENCOUNTER — Ambulatory Visit
Admission: EM | Admit: 2021-09-07 | Discharge: 2021-09-07 | Disposition: A | Discharge status: Other Acute Inpatient Hospital | Payer: BC Managed Care – PPO | Attending: Family Medicine | Admitting: Family Medicine

## 2021-09-07 ENCOUNTER — Encounter: Payer: Self-pay | Admitting: Emergency Medicine

## 2021-09-07 ENCOUNTER — Emergency Department (HOSPITAL_COMMUNITY): Payer: BC Managed Care – PPO

## 2021-09-07 ENCOUNTER — Emergency Department (HOSPITAL_COMMUNITY)
Admission: EM | Admit: 2021-09-07 | Discharge: 2021-09-07 | Disposition: A | Discharge status: Home / Self Care | Payer: BC Managed Care – PPO | Attending: Emergency Medicine | Admitting: Emergency Medicine

## 2021-09-07 ENCOUNTER — Encounter (HOSPITAL_COMMUNITY): Payer: Self-pay

## 2021-09-07 ENCOUNTER — Other Ambulatory Visit: Payer: Self-pay

## 2021-09-07 DIAGNOSIS — W010XXA Fall on same level from slipping, tripping and stumbling without subsequent striking against object, initial encounter: Secondary | ICD-10-CM | POA: Diagnosis not present

## 2021-09-07 DIAGNOSIS — E039 Hypothyroidism, unspecified: Secondary | ICD-10-CM | POA: Diagnosis not present

## 2021-09-07 DIAGNOSIS — M79622 Pain in left upper arm: Secondary | ICD-10-CM

## 2021-09-07 DIAGNOSIS — F17210 Nicotine dependence, cigarettes, uncomplicated: Secondary | ICD-10-CM | POA: Diagnosis not present

## 2021-09-07 DIAGNOSIS — I10 Essential (primary) hypertension: Secondary | ICD-10-CM | POA: Insufficient documentation

## 2021-09-07 DIAGNOSIS — Z79899 Other long term (current) drug therapy: Secondary | ICD-10-CM | POA: Insufficient documentation

## 2021-09-07 DIAGNOSIS — S42342A Displaced spiral fracture of shaft of humerus, left arm, initial encounter for closed fracture: Secondary | ICD-10-CM | POA: Insufficient documentation

## 2021-09-07 DIAGNOSIS — S4992XA Unspecified injury of left shoulder and upper arm, initial encounter: Secondary | ICD-10-CM | POA: Diagnosis present

## 2021-09-07 LAB — PREGNANCY, URINE: Preg Test, Ur: NEGATIVE

## 2021-09-07 IMAGING — CR DG HUMERUS 2V *L*
2 series · 2 of 2 positions shown · non-contrast
Comparison: None.

CLINICAL DATA: Fall with pain and deformity.

EXAM:
LEFT HUMERUS - 2+ VIEW

[x humerus lat left]
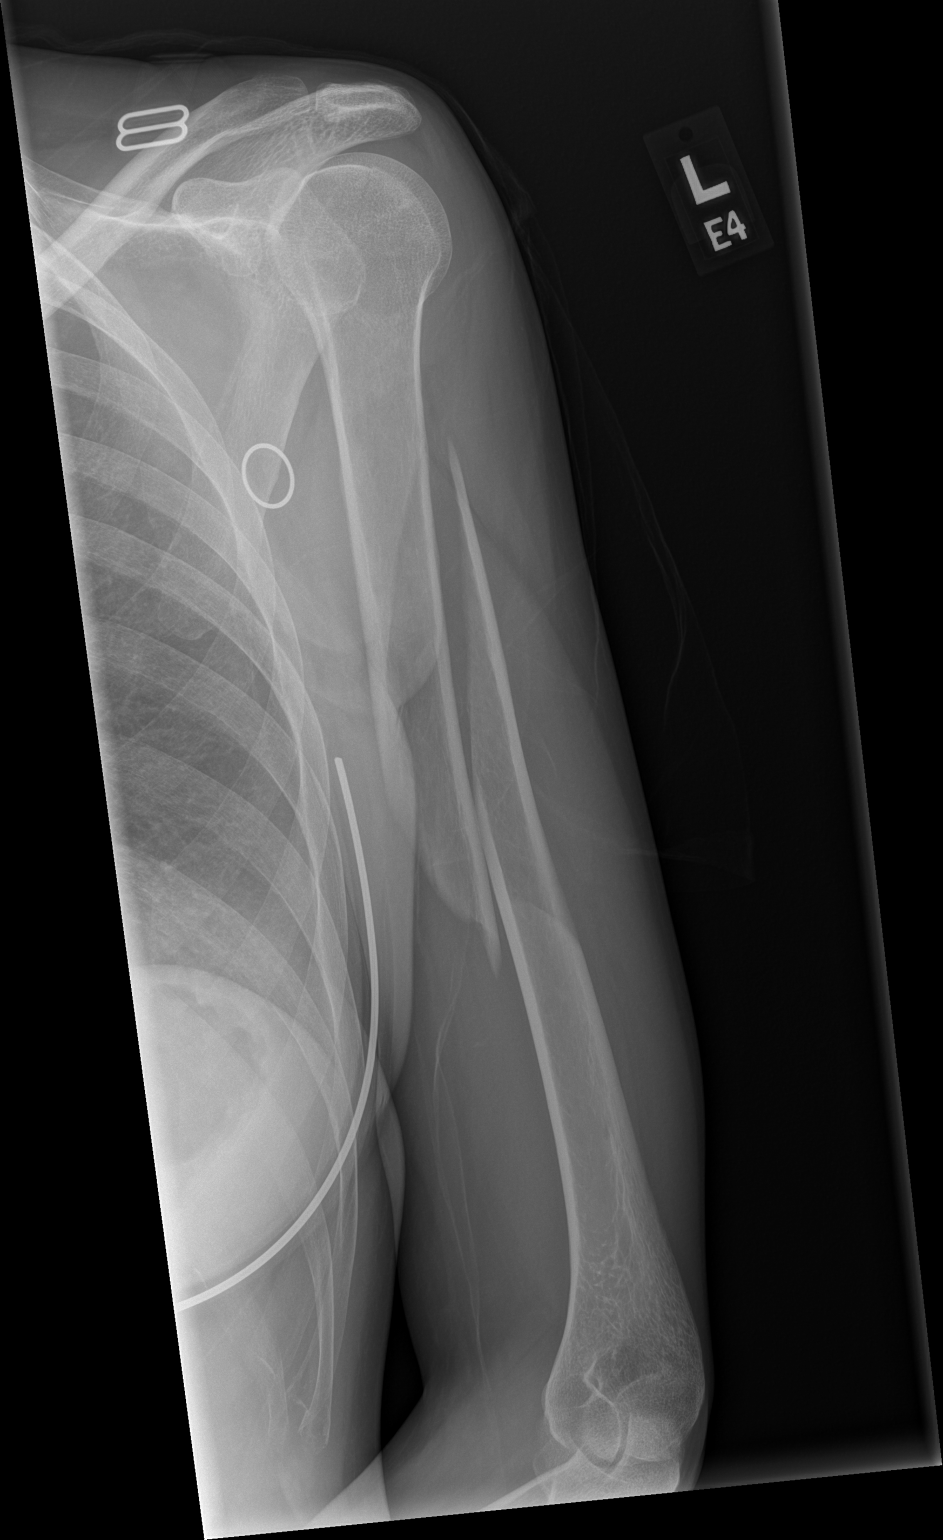

[x humerus ap left]
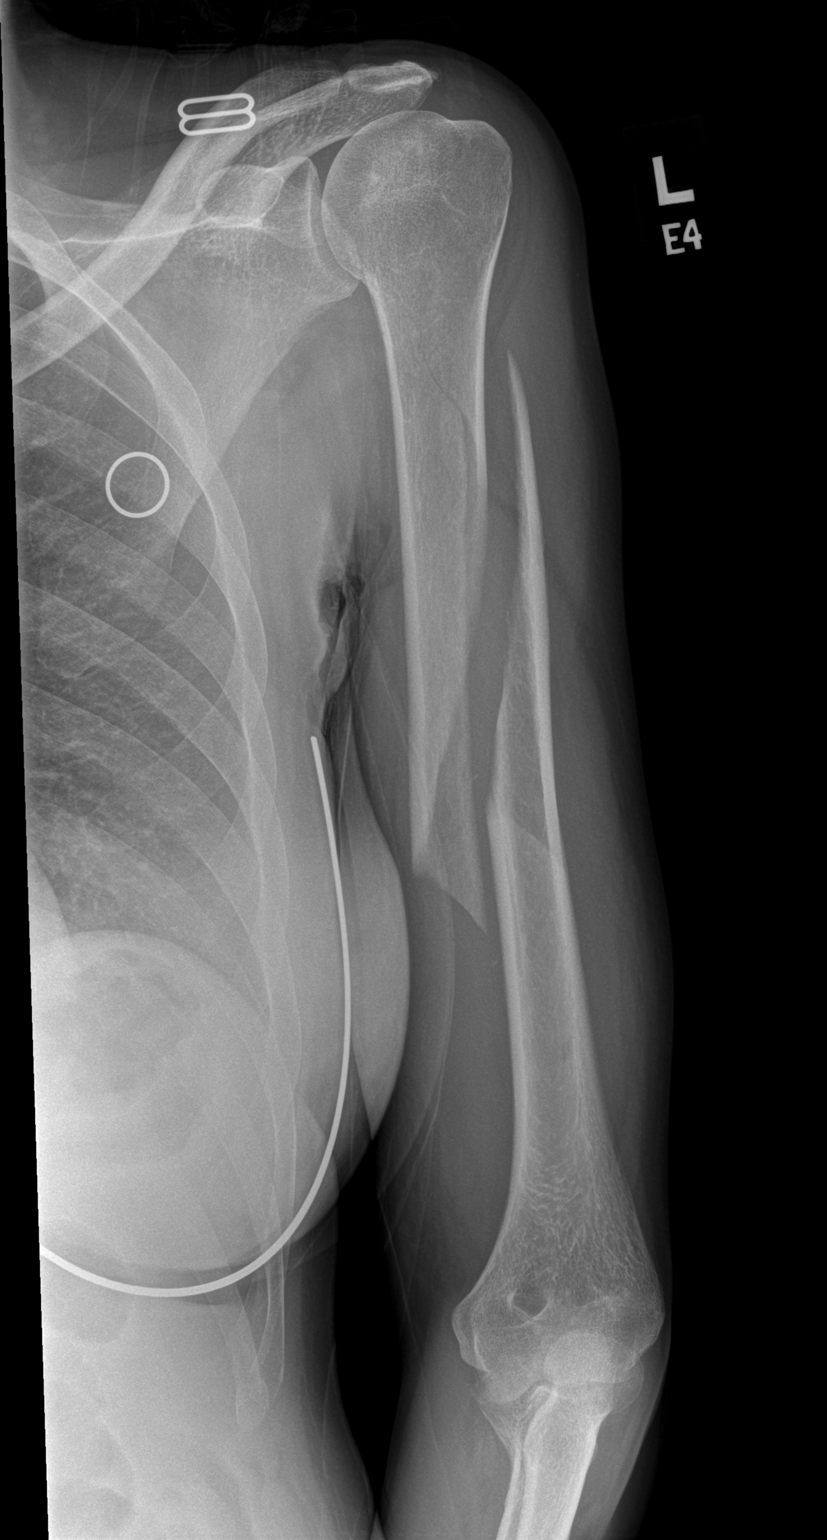

[2 of 2 positions shown; findings below may reference images not displayed]

FINDINGS: Spiral oblique fracture of the mid diaphysis of the humerus with 2-3
cm of displacement and mild foreshortening. No abnormality of the
shoulder joint or elbow joint identified.
IMPRESSION: Displaced oblique spiral fracture of the diaphysis of the humerus.

## 2021-09-07 MED ORDER — HYDROCODONE-ACETAMINOPHEN 5-325 MG PO TABS: 1.0000 | ORAL_TABLET | Freq: Four times a day (QID) | ORAL | 0 refills | Status: DC | PRN

## 2021-09-07 MED ORDER — HYDROCODONE-ACETAMINOPHEN 5-325 MG PO TABS
1.0000 | ORAL_TABLET | Freq: Once | ORAL | Status: AC
  Administered 2021-09-07: 1 via ORAL
  Filled 2021-09-07: qty 1

## 2021-09-07 NOTE — ED Triage Notes (Signed)
Pt here for left arm pain after fall; pt unable to move arm with obvious deformity noted; CMS intact; pt is severe pain

## 2021-09-07 NOTE — ED Notes (Signed)
Pt to go to ED for further eval and pain control

## 2021-09-07 NOTE — Discharge Instructions (Signed)
Keep the splint dry.  Wear the sling for comfort.  Take the hydrocodone as needed for pain.  Call Dr. Mort Sawyers orthopedic office tomorrow for follow-up.

## 2021-09-07 NOTE — ED Provider Notes (Addendum)
[facility] EMERGENCY DEPARTMENT Provider Note   CSN: [id number] Arrival date & time: 09/07/21  1439     History Chief Complaint  Patient presents with   Samantha Rosales is a 40 y.o. female.  Patient had a slip and fell backwards today.  With pain to her left upper arm.  Did not strike her head.  No other complaints.  Denies any numbness.  Says her arm is just floppy.  But down in the fingers everything moves well wrist moves well with no numbness.  No loss of consciousness no back pain no neck pain.      Past Medical History:  Diagnosis Date   Abnormal Pap smear    Alcohol use    Allergic rhinitis    BV (bacterial vaginosis)    GERD (gastroesophageal reflux disease)    Hepatitis    LFT's >1000 (2019) due to methotrexate vs alcohol   High cholesterol    HSV-2 (herpes simplex virus 2) infection    Hypertension    Pancreatitis    12/19 (thought to be due to alcohol)   Rheumatoid arthritis (HCC)    Seizures (HCC)    dx as child with epilepsy but no seizures since 89 and on no meds   Vaginal Pap smear, abnormal     Patient Active Problem List   Diagnosis Date Noted   Hepatic steatosis 08/19/2021   Bilateral optic neuropathy 03/22/2021   Retinal edema 03/22/2021   Abnormal auditory perception of both ears 03/21/2021   Pancreatitis    Hypertensive urgency 10/17/2019   Hypokalemia 10/17/2019   Hypomagnesemia 10/17/2019   Alcohol abuse 10/17/2019   Tobacco abuse 10/17/2019   Hypothyroidism 10/17/2019   Alcohol withdrawal (HCC) 10/17/2019   Lactic acidosis 10/17/2019   Confusion    Abnormal LFTs 09/05/2018   Elevated lipase 09/05/2018   History of methotrexate therapy 09/05/2018   High triglycerides 09/05/2018   Elevated liver enzymes 07/01/2018   Essential hypertension 07/01/2018   Encounter for well woman exam with routine gynecological exam 07/01/2018   History of abnormal cervical Pap smear 06/26/2017   Encounter for surveillance of injectable  contraceptive 06/26/2017   Encounter for gynecological examination with Papanicolaou smear of cervix 06/26/2017   High risk medication use 06/14/2015   Rheumatoid arthritis involving multiple sites with positive rheumatoid factor (HCC) 02/24/2015   Arthritis, rheumatic, acute or subacute 11/18/2013   Urticaria, idiopathic 08/24/2013   HSV-2 (herpes simplex virus 2) infection 05/29/2013   HPV (human papilloma virus) infection 05/29/2013    Past Surgical History:  Procedure Laterality Date   CERVICAL CONIZATION W/BX  12/20/2012   Procedure: CONIZATION CERVIX WITH BIOPSY;  Surgeon: Lazaro Arms, MD;  Location: [facility];  Service: Gynecology;  Laterality: N/A;   HOLMIUM LASER APPLICATION  12/20/2012   Procedure: HOLMIUM LASER APPLICATION;  Surgeon: Lazaro Arms, MD;  Location: [facility];  Service: Gynecology;  Laterality: N/A;     OB History     Gravida  4   Para  2   Term  2   Preterm      AB  2   Living  2      SAB  1   IAB  1   Ectopic      Multiple      Live Births  2           Family History  Problem Relation Age of Onset   Hypertension Mother    Hypertension Father  Pancreatic cancer Paternal Uncle    Colon cancer Neg Hx    Esophageal cancer Neg Hx    Inflammatory bowel disease Neg Hx    Liver disease Neg Hx    Rectal cancer Neg Hx    Stomach cancer Neg Hx     Social History   Tobacco Use   Smoking status: Every Day    Packs/day: 0.50    Years: 15.00    Pack years: 7.50    Types: Cigarettes   Smokeless tobacco: Never  Vaping Use   Vaping Use: Never used  Substance Use Topics   Alcohol use: Yes    Alcohol/week: 2.0 standard drinks    Types: 2 Standard drinks or equivalent per week    Comment: about 2 drinks per day   Drug use: No    Home Medications Prior to Admission medications   Medication Sig Start Date End Date Taking? Authorizing Provider  Biotin 1000 MCG tablet Take 1,000 mcg by mouth daily.    [provider]   cycloSPORINE (RESTASIS) 0.05 % ophthalmic emulsion INSTILL 1 DROP INTO AFFECTED EYE EVERY 12 HOURS 12/31/20   [provider]  escitalopram (LEXAPRO) 10 MG tablet TAKE 1 TABLET BY MOUTH EVERY DAY 01/17/21   [place], Velna Hatchet, MD  fenofibrate micronized (LOFIBRA) 200 MG capsule Take 1 capsule (200 mg total) by mouth at bedtime. 06/10/19   Danelle Berry, PA-C  fluticasone (FLONASE) 50 MCG/ACT nasal spray USE TWO SPRAY(S) IN EACH NOSTRIL ONCE DAILY Patient taking differently: as needed. 06/21/17   Donita Brooks, MD  folic acid (FOLVITE) 1 MG tablet Take 1 mg by mouth daily.    [provider]  HUMIRA PEN 40 MG/0.4ML PNKT Inject 40 mg into the skin every 14 (fourteen) days.  05/28/20   [provider]  ibuprofen (ADVIL) 600 MG tablet Take 1 tablet (600 mg total) by mouth every 6 (six) hours as needed. 05/03/21   Moshe Cipro, NP  Icosapent Ethyl (VASCEPA) 1 g CAPS Take 2 capsules (2 g total) by mouth 2 (two) times daily. 09/05/18   Donita Brooks, MD  medroxyPROGESTERone (DEPO-PROVERA) 150 MG/ML injection Inject 1 mL (150 mg total) into the muscle every 3 (three) months. INJECT 1 ML INTRAMUSCULARLY IN DOCTOR'S OFFICE EVERY 3 MONTHS. 04/19/21   Adline Potter, NP  methocarbamol (ROBAXIN) 750 MG tablet Take 750 mg by mouth every 8 (eight) hours as needed for muscle spasms.    [provider]  nebivolol (BYSTOLIC) 10 MG tablet Take 1 tablet (10 mg total) by mouth daily. 04/05/21   Donita Brooks, MD  pantoprazole (PROTONIX) 40 MG tablet TAKE 1 TABLET BY MOUTH TWICE A DAY 12/31/19   Mansour[place], Netty Starring., MD  potassium chloride SA (KLOR-CON) 20 MEQ tablet Take 1 tablet (20 mEq total) by mouth daily. 04/01/21 06/04/23  Valentino Nose, NP  spironolactone (ALDACTONE) 25 MG tablet Take 1 tablet (25 mg total) by mouth daily. 04/05/21   Donita Brooks, MD  thiamine 100 MG tablet Take 100 mg by mouth 2 (two) times daily. 04/18/21   [provider]   traMADol (ULTRAM) 50 MG tablet Take 1 tablet (50 mg total) by mouth every 6 (six) hours as needed. 05/03/21   Moshe Cipro, NP  valACYclovir (VALTREX) 1000 MG tablet Take 1 tablet by mouth in the morning and at bedtime.    [provider]    Allergies    Penicillins  Review of Systems   Review of  Systems  Constitutional:  Negative for chills and fever.  HENT:  Negative for ear pain and sore throat.   Eyes:  Negative for pain and visual disturbance.  Respiratory:  Negative for cough and shortness of breath.   Cardiovascular:  Negative for chest pain and palpitations.  Gastrointestinal:  Negative for abdominal pain and vomiting.  Genitourinary:  Negative for dysuria and hematuria.  Musculoskeletal:  Negative for arthralgias and back pain.  Skin:  Negative for color change and rash.  Neurological:  Negative for seizures and syncope.  Hematological:  Does not bruise/bleed easily.  All other systems reviewed and are negative.  Physical Exam Updated Vital Signs BP 122/88 (BP Location: Right Arm)   Pulse 82   Temp 98.9 F (37.2 C) (Oral)   Resp 17   Ht 1.727 m (5\' 8" )   Wt 54.4 kg   SpO2 100%   BMI 18.25 kg/m   Physical Exam Vitals and nursing note reviewed.  Constitutional:      General: She is not in acute distress.    Appearance: Normal appearance. She is well-developed.  HENT:     Head: Normocephalic and atraumatic.  Eyes:     Extraocular Movements: Extraocular movements intact.     Conjunctiva/sclera: Conjunctivae normal.     Pupils: Pupils are equal, round, and reactive to light.  Cardiovascular:     Rate and Rhythm: Normal rate and regular rhythm.     Heart sounds: No murmur heard. Pulmonary:     Effort: Pulmonary effort is normal. No respiratory distress.     Breath sounds: Normal breath sounds.  Abdominal:     Palpations: Abdomen is soft.     Tenderness: There is no abdominal tenderness.  Musculoskeletal:        General: Swelling and  tenderness present.     Cervical back: Normal range of motion and neck supple. No rigidity or tenderness.     Comments: No obvious deformity to the left arm.  But there is tenderness to the mid part of the humerus with some swelling.  Shoulder without any deformity.  Elbow without any deformity.  Good movement at the wrist and fingers.  Neurovascularly intact distally.  Radial pulses 2+.  No numbness to the forearm no numbness to the upper arm.  Thoracic and lumbar back without any tenderness to palpation.  Good range of motion of both lower extremities.  Without any pain particularly at the hips.  Skin:    General: Skin is warm and dry.     Capillary Refill: Capillary refill takes less than 2 seconds.  Neurological:     General: No focal deficit present.     Mental Status: She is alert and oriented to person, place, and time.    ED Results / Procedures / Treatments   Labs (all labs ordered are listed, but only abnormal results are displayed) Labs Reviewed  PREGNANCY, URINE    EKG None  Radiology DG Humerus Left  Result Date: 09/07/2021 CLINICAL DATA:  Fall with pain and deformity. EXAM: LEFT HUMERUS - 2+ VIEW COMPARISON:  None. FINDINGS: Spiral oblique fracture of the mid diaphysis of the humerus with 2-3 cm of displacement and mild foreshortening. No abnormality of the shoulder joint or elbow joint identified. IMPRESSION: Displaced oblique spiral fracture of the diaphysis of the humerus. Electronically Signed   By: 11/07/2021 M.D.   On: 09/07/2021 15:39    Procedures Procedures   Medications Ordered in ED Medications  HYDROcodone-acetaminophen (NORCO/VICODIN) 5-325 MG per  tablet 1 tablet (1 tablet Oral Given 09/07/21 1619)    ED Course  I have reviewed the triage vital signs and the nursing notes.  Pertinent labs & imaging results that were available during my care of the patient were reviewed by me and considered in my medical decision making (see chart for details).     MDM Rules/Calculators/A&P                           Patient with a spiral fracture to the left humerus.  Neurovascularly seems to be intact.  Patient be placed in a sling.  We will discussed with Dr. Romeo Apple is on-call for orthopedics for proper management.  Patient given 1 dose of hydrocodone to help with the pain.  Discussed with Dr. Romeo Apple on-call.  He wants a long-arm splint and an elbow type sugar-tong splint.  And then follow-up in the office.  We will also have patient use the sling to help support the arm.  Will give a prescription for hydrocodone for the pain.   Final Clinical Impression(s) / ED Diagnoses Final diagnoses:  Closed displaced spiral fracture of shaft of left humerus, initial encounter    Rx / DC Orders ED Discharge Orders     None        Vanetta Mulders, MD 09/07/21 1645    Vanetta Mulders, MD 09/07/21 1753

## 2021-09-07 NOTE — ED Provider Notes (Signed)
[facility] CARE CENTER   [id number] 09/07/21 Arrival Time: 1254  ASSESSMENT & PLAN:  1. Left upper arm pain    Significant pain. Offered Toradol and attempt at imaging. Reports she is in too much pain. I have nothing better for pain here. Discussed. Elects ED evaluation.  Reviewed expectations re: course of current medical issues. Questions answered. Outlined signs and symptoms indicating need for more acute intervention. Patient verbalized understanding. After Visit Summary given.  SUBJECTIVE: History from: patient. Samantha Rosales is a 40 y.o. female who reports fall today; upper L arm pain and swelling. Any movement exacerbates. Mild distal tingling. No tx PTA.   Past Surgical History:  Procedure Laterality Date   CERVICAL CONIZATION W/BX  12/20/2012   Procedure: CONIZATION CERVIX WITH BIOPSY;  Surgeon: Lazaro Arms, MD;  Location: [facility];  Service: Gynecology;  Laterality: N/A;   HOLMIUM LASER APPLICATION  12/20/2012   Procedure: HOLMIUM LASER APPLICATION;  Surgeon: Lazaro Arms, MD;  Location: [facility];  Service: Gynecology;  Laterality: N/A;      OBJECTIVE:  General appearance: alert; no distress HEENT: [place]; AT Neck: supple with FROM Resp: unlabored respirations Extremities: LUE: very TTP over L proximal arm; with swelling; pain with shoulder movement CV: brisk extremity capillary refill of LUE; 2+ radial pulse of LUE. Skin: warm and dry; no visible rashes Neurologic: gait normal; normal sensation and strength of LUE Psychological: alert and cooperative; normal mood and affect    Allergies  Allergen Reactions   Penicillins     Has patient had a PCN reaction causing immediate rash, facial/tongue/throat swelling, SOB or lightheadedness with hypotension: Unknown Has patient had a PCN reaction causing severe rash involving mucus membranes or skin necrosis: Unknown Has patient had a PCN reaction that required hospitalization: Unknown Has patient had a PCN reaction  occurring within the last 10 years: No If all of the above answers are "NO", then may proceed with Cephalosporin use.     Past Medical History:  Diagnosis Date   Abnormal Pap smear    Alcohol use    Allergic rhinitis    BV (bacterial vaginosis)    GERD (gastroesophageal reflux disease)    Hepatitis    LFT's >1000 (2019) due to methotrexate vs alcohol   High cholesterol    HSV-2 (herpes simplex virus 2) infection    Hypertension    Pancreatitis    12/19 (thought to be due to alcohol)   Rheumatoid arthritis (HCC)    Seizures (HCC)    dx as child with epilepsy but no seizures since 89 and on no meds   Vaginal Pap smear, abnormal    Social History   Socioeconomic History   Marital status: Married    Spouse name: Not on file   Number of children: 2   Years of education: Not on file   Highest education level: Not on file  Occupational History   Occupation: [employer]    Employer: [employer]   Tobacco Use   Smoking status: Every Day    Packs/day: 0.50    Years: 15.00    Pack years: 7.50    Types: Cigarettes   Smokeless tobacco: Never  Vaping Use   Vaping Use: Never used  Substance and Sexual Activity   Alcohol use: Yes    Alcohol/week: 2.0 standard drinks    Types: 2 Standard drinks or equivalent per week    Comment: about 2 drinks per day   Drug use: No   Sexual  activity: Yes    Birth control/protection: Injection    Comment: Depo Provera every 3 months  Other Topics Concern   Not on file  Social History Narrative   Not on file   Social Determinants of Health   Financial Resource Strain: Not on file  Food Insecurity: Not on file  Transportation Needs: Not on file  Physical Activity: Not on file  Stress: Not on file  Social Connections: Not on file   Family History  Problem Relation Age of Onset   Hypertension Mother    Hypertension Father    Pancreatic cancer Paternal Uncle    Colon cancer Neg Hx    Esophageal cancer Neg Hx     Inflammatory bowel disease Neg Hx    Liver disease Neg Hx    Rectal cancer Neg Hx    Stomach cancer Neg Hx    Past Surgical History:  Procedure Laterality Date   CERVICAL CONIZATION W/BX  12/20/2012   Procedure: CONIZATION CERVIX WITH BIOPSY;  Surgeon: Lazaro Arms, MD;  Location: [facility];  Service: Gynecology;  Laterality: N/A;   HOLMIUM LASER APPLICATION  12/20/2012   Procedure: HOLMIUM LASER APPLICATION;  Surgeon: Lazaro Arms, MD;  Location: [facility];  Service: Gynecology;  Laterality: N/A;       Mardella Layman, MD 09/07/21 1420

## 2021-09-07 NOTE — ED Triage Notes (Signed)
Pt fell ~12pm today, on wooden plank. Pt points to tricep of left arm, unable to move and tender to touch. Able to move fingers distally.

## 2021-09-08 ENCOUNTER — Ambulatory Visit: Payer: BC Managed Care – PPO | Admitting: Physician Assistant

## 2021-09-12 ENCOUNTER — Ambulatory Visit: Payer: BC Managed Care – PPO

## 2021-09-12 ENCOUNTER — Encounter: Payer: Self-pay | Admitting: Orthopedic Surgery

## 2021-09-12 ENCOUNTER — Other Ambulatory Visit: Payer: Self-pay

## 2021-09-12 ENCOUNTER — Ambulatory Visit (INDEPENDENT_AMBULATORY_CARE_PROVIDER_SITE_OTHER): Payer: BC Managed Care – PPO | Admitting: Orthopedic Surgery

## 2021-09-12 VITALS — BP 160/100 | HR 99 | Ht 68.0 in | Wt 127.4 lb

## 2021-09-12 DIAGNOSIS — S42342A Displaced spiral fracture of shaft of humerus, left arm, initial encounter for closed fracture: Secondary | ICD-10-CM

## 2021-09-12 NOTE — Progress Notes (Signed)
Chief Complaint  Patient presents with   Arm Injury    Fx lt shoulder// seen in [facility] ED// displaced// DOI 09/07/21 fell   PFSH REVIEWED   CHRONIC DISEASES AFFECTING TREAMENT ARE : Chronic pancreatitis and liver disease  40 year old female slipped going out to put the trash in the trash can.  She was injured on October 5 she presented to the emergency department with a left spiral humerus fracture which was splinted she was started on hydrocodone for pain  BP (!) 160/100   Pulse 99   Ht 5\' 8"  (1.727 m)   Wt 127 lb 6.4 oz (57.8 kg)   BMI 19.37 kg/m   Appearance the patient has a small frame she has very small arms measuring 9 inches at the right mid humerus  She is oriented x3  Her mood is pleasant her affect is normal  She is ambulatory without any assistive devices and no limp  Her left arm is in a splint we unwrapped the splint and rewrapped it for comfort and allowed her to scratch the skin  Overall alignment clinically looks normal.  The range of motion of the wrist and hand are normal with intact thumb extension MCP joint extension and wrist extension.  Shoulder stability and elbow stability were deferred as we did not want to remove the splint as the alignment of the bone was excellent  Skin was otherwise intact there was some ecchymosis in the upper humerus and mid humerus.  Her cardiovascular exam of the limb was normal there was normal sensation  The outside x-ray shows a long spiral fracture with 100% displacement of the fracture fragments  Repeat x-rays in the office shows similar alignment with a slightly less amount of displacement but this could be x-ray positioning  I did speak to a traumatologist regarding this injury (Dr. )  We discussed the possibilities of surgery versus nonoperative treatment and decided based on the fracture configuration which includes a long spiral to oblique pattern with excellent opportunity for healing  We also consider the  possibilities of excessive bleeding and the more difficult surgery to takedown a nonunion  I explained this to the patient and she agrees to continue with splinting for 2 weeks and then x-rays can be obtained in the splint with plans to convert to a fracture brace

## 2021-09-13 ENCOUNTER — Other Ambulatory Visit: Payer: Self-pay | Admitting: Orthopedic Surgery

## 2021-09-13 ENCOUNTER — Telehealth: Payer: Self-pay | Admitting: Orthopedic Surgery

## 2021-09-13 ENCOUNTER — Telehealth: Payer: Self-pay | Admitting: Radiology

## 2021-09-13 MED ORDER — HYDROCODONE-ACETAMINOPHEN 5-325 MG PO TABS: 1.0000 | ORAL_TABLET | Freq: Four times a day (QID) | ORAL | 0 refills | Status: DC | PRN

## 2021-09-13 NOTE — Telephone Encounter (Signed)
Called patient; aware. 

## 2021-09-13 NOTE — Telephone Encounter (Signed)
Patient called back to relay that she thinks her bandage is too tight as she has swelling - please advise if needs to come in; said she can work it out if so.

## 2021-09-13 NOTE — Telephone Encounter (Signed)
Patient called to follow up on refill of pain medication per yesterday's office visit, 09/12/21, with Dr Romeo Apple: HYDROcodone-acetaminophen (NORCO/VICODIN) 5-325 MG tablet 14 tablet    [facility]

## 2021-09-13 NOTE — Telephone Encounter (Signed)
Completed a prior auth in cover my meds for hydrocodone  they tell me:Your request has been approved

## 2021-09-14 ENCOUNTER — Encounter: Payer: Self-pay | Admitting: Orthopedic Surgery

## 2021-09-14 ENCOUNTER — Ambulatory Visit (INDEPENDENT_AMBULATORY_CARE_PROVIDER_SITE_OTHER): Payer: BC Managed Care – PPO | Admitting: Orthopedic Surgery

## 2021-09-14 ENCOUNTER — Other Ambulatory Visit: Payer: Self-pay

## 2021-09-14 DIAGNOSIS — S42342D Displaced spiral fracture of shaft of humerus, left arm, subsequent encounter for fracture with routine healing: Secondary | ICD-10-CM

## 2021-09-14 DIAGNOSIS — S42342A Displaced spiral fracture of shaft of humerus, left arm, initial encounter for closed fracture: Secondary | ICD-10-CM | POA: Insufficient documentation

## 2021-09-14 NOTE — Progress Notes (Signed)
Chief Complaint  Patient presents with   Hand Problem    Left hand swelling since splint re wrapped on Monday / states doesn't feel too tight but hand swollen    Encounter Diagnosis  Name Primary?   Closed displaced spiral fracture of shaft of left humerus with routine healing, subsequent encounter 09/07/21 Yes    Samantha Rosales started to have some hand swelling she does not really think the splint area is too tight  Her hand is indeed swollen but the radial nerve function remains intact we rewrapped from the distal palmar crease up to the mid forearm and this should help otherwise she will see [place] as needed.

## 2021-09-21 ENCOUNTER — Ambulatory Visit: Payer: BC Managed Care – PPO

## 2021-09-21 ENCOUNTER — Other Ambulatory Visit: Payer: Self-pay

## 2021-09-21 ENCOUNTER — Ambulatory Visit (INDEPENDENT_AMBULATORY_CARE_PROVIDER_SITE_OTHER): Payer: BC Managed Care – PPO | Admitting: Orthopedic Surgery

## 2021-09-21 ENCOUNTER — Encounter: Payer: Self-pay | Admitting: Orthopedic Surgery

## 2021-09-21 DIAGNOSIS — S42342D Displaced spiral fracture of shaft of humerus, left arm, subsequent encounter for fracture with routine healing: Secondary | ICD-10-CM

## 2021-09-21 DIAGNOSIS — M2550 Pain in unspecified joint: Secondary | ICD-10-CM | POA: Insufficient documentation

## 2021-09-21 DIAGNOSIS — G9332 Myalgic encephalomyelitis/chronic fatigue syndrome: Secondary | ICD-10-CM | POA: Insufficient documentation

## 2021-09-21 MED ORDER — HYDROCODONE-ACETAMINOPHEN 5-325 MG PO TABS: 1.0000 | ORAL_TABLET | Freq: Four times a day (QID) | ORAL | 0 refills | Status: DC | PRN

## 2021-09-21 NOTE — Progress Notes (Signed)
Chief Complaint  Patient presents with   Fracture    Left humerus fracture DOI 09/07/21    Encounter Diagnosis  Name Primary?   Closed displaced spiral fracture of shaft of left humerus with routine healing, subsequent encounter 09/07/21 Yes   Samantha Rosales is here for her 2-week follow-up x-ray on the left humerus  She has good pain control.  She has normal radial nerve function with normal extension of the metacarpophalangeal joints and thumb and wrist  Her x-ray shows a long spiral fracture with separation of fracture fragments but normal alignment on both views  Patient is placed in a fracture brace and sling  Follow-up in 4 to 6 weeks for repeat x-ray   Meds ordered this encounter  Medications   HYDROcodone-acetaminophen (NORCO/VICODIN) 5-325 MG tablet    Sig: Take 1 tablet by mouth every 6 (six) hours as needed.    Dispense:  28 tablet    Refill:  0

## 2021-10-04 DIAGNOSIS — Z419 Encounter for procedure for purposes other than remedying health state, unspecified: Secondary | ICD-10-CM | POA: Diagnosis not present

## 2021-10-06 ENCOUNTER — Ambulatory Visit: Payer: BC Managed Care – PPO

## 2021-10-12 ENCOUNTER — Ambulatory Visit (INDEPENDENT_AMBULATORY_CARE_PROVIDER_SITE_OTHER): Payer: BC Managed Care – PPO | Admitting: Orthopedic Surgery

## 2021-10-12 ENCOUNTER — Other Ambulatory Visit: Payer: Self-pay | Admitting: Orthopedic Surgery

## 2021-10-12 ENCOUNTER — Other Ambulatory Visit: Payer: Self-pay

## 2021-10-12 ENCOUNTER — Encounter: Payer: Self-pay | Admitting: Orthopedic Surgery

## 2021-10-12 ENCOUNTER — Ambulatory Visit: Payer: BC Managed Care – PPO

## 2021-10-12 DIAGNOSIS — S42342D Displaced spiral fracture of shaft of humerus, left arm, subsequent encounter for fracture with routine healing: Secondary | ICD-10-CM

## 2021-10-12 NOTE — Progress Notes (Signed)
Chief Complaint  Patient presents with   Fracture    Left humerus 09/07/21   5weeks status post spiral fracture left humerus.  Converted to a fracture brace.  X-rays show a long spiral fracture with good apposition minimal to no angulation on either view  Patient's radial nerve is intact  Recommend x-ray in 6 weeks continue fracture cuff

## 2021-10-13 ENCOUNTER — Other Ambulatory Visit: Payer: Self-pay | Admitting: Orthopedic Surgery

## 2021-10-13 DIAGNOSIS — S42342D Displaced spiral fracture of shaft of humerus, left arm, subsequent encounter for fracture with routine healing: Secondary | ICD-10-CM

## 2021-10-13 MED ORDER — HYDROCODONE-ACETAMINOPHEN 5-325 MG PO TABS: 1.0000 | ORAL_TABLET | Freq: Four times a day (QID) | ORAL | 0 refills | Status: DC | PRN

## 2021-10-13 NOTE — Telephone Encounter (Signed)
Patient said she was asked about refill - she has called back to ask if she may have it: HYDROcodone-acetaminophen (NORCO/VICODIN) 5-325 MG tablet 28 tablet 0                [facility]

## 2021-11-03 DIAGNOSIS — Z419 Encounter for procedure for purposes other than remedying health state, unspecified: Secondary | ICD-10-CM | POA: Diagnosis not present

## 2021-11-09 ENCOUNTER — Other Ambulatory Visit: Payer: Self-pay

## 2021-11-09 ENCOUNTER — Ambulatory Visit (INDEPENDENT_AMBULATORY_CARE_PROVIDER_SITE_OTHER): Payer: BC Managed Care – PPO | Admitting: Orthopedic Surgery

## 2021-11-09 ENCOUNTER — Ambulatory Visit: Payer: BC Managed Care – PPO

## 2021-11-09 ENCOUNTER — Encounter: Payer: Self-pay | Admitting: Orthopedic Surgery

## 2021-11-09 DIAGNOSIS — S42342D Displaced spiral fracture of shaft of humerus, left arm, subsequent encounter for fracture with routine healing: Secondary | ICD-10-CM

## 2021-11-09 NOTE — Progress Notes (Signed)
Chief Complaint  Patient presents with   Arm Injury    Left humerus fracture 09/07/21   2 months post humerus fracture shaft  X-rays show good alignment and callus formation  Radial nerve INTACT   Pain meds  Follow-up 4 weeks xrays   START CODMAN EXERCISES

## 2021-11-25 ENCOUNTER — Other Ambulatory Visit: Payer: Self-pay

## 2021-11-25 ENCOUNTER — Other Ambulatory Visit: Payer: Self-pay | Admitting: Nurse Practitioner

## 2021-11-25 DIAGNOSIS — E876 Hypokalemia: Secondary | ICD-10-CM

## 2021-11-25 NOTE — Telephone Encounter (Signed)
Per chart pt has not refilled potassium since 04/01/21. Please advise, thanks!

## 2021-12-04 DIAGNOSIS — Z419 Encounter for procedure for purposes other than remedying health state, unspecified: Secondary | ICD-10-CM | POA: Diagnosis not present

## 2021-12-07 ENCOUNTER — Ambulatory Visit: Payer: BC Managed Care – PPO

## 2021-12-07 ENCOUNTER — Ambulatory Visit (INDEPENDENT_AMBULATORY_CARE_PROVIDER_SITE_OTHER): Payer: BC Managed Care – PPO | Admitting: Orthopedic Surgery

## 2021-12-07 ENCOUNTER — Other Ambulatory Visit: Payer: Self-pay

## 2021-12-07 ENCOUNTER — Encounter: Payer: Self-pay | Admitting: Orthopedic Surgery

## 2021-12-07 DIAGNOSIS — S42342D Displaced spiral fracture of shaft of humerus, left arm, subsequent encounter for fracture with routine healing: Secondary | ICD-10-CM

## 2021-12-07 NOTE — Progress Notes (Signed)
Chief Complaint  Patient presents with   Arm Injury    09/07/21 left humerus fracture improving has d/c brace last week   Ms. Herbon remove the brace  Her exam shows an intact radial nerve with good wrist extension MCP joint extension thumb extension  No pain at her fracture site no motion at the fracture site  Patient has 120 degrees of forward elevation  X-rays show a long oblique to spiral humerus fracture with alignment less than 15 degrees of angulation in any plane.  Shoulder x-rays as you can still see the long fracture line  She has good callus formation  Recommend that she not lift anything over 10 pounds  We will repeat the x-ray in 3 months

## 2022-01-04 DIAGNOSIS — Z419 Encounter for procedure for purposes other than remedying health state, unspecified: Secondary | ICD-10-CM | POA: Diagnosis not present

## 2022-01-29 ENCOUNTER — Other Ambulatory Visit: Payer: BC Managed Care – PPO

## 2022-02-01 DIAGNOSIS — Z419 Encounter for procedure for purposes other than remedying health state, unspecified: Secondary | ICD-10-CM | POA: Diagnosis not present

## 2022-02-06 ENCOUNTER — Ambulatory Visit: Payer: BC Managed Care – PPO | Admitting: Orthopedic Surgery

## 2022-02-07 ENCOUNTER — Telehealth: Payer: Self-pay | Admitting: Family Medicine

## 2022-02-07 NOTE — Telephone Encounter (Signed)
Spoke with patient and she has already scheduled OV for Thursday.  ? ?Advised she can take meclizine OTC for nausea, increase fluid intake, can try Pedialyte/Gatorade as well. Bland diet with lighter fare foods. Patient voiced understanding. Nothing further needed at this time.  ? ?

## 2022-02-07 NOTE — Telephone Encounter (Signed)
Received call from patient to request call back from nurse. Patient states unable to eat (vomits immediately); also has loose stools. Feet tingly (not numb). Unsure if it's gallbladder related. ? ?Please advise at [phone]. ?

## 2022-02-09 ENCOUNTER — Ambulatory Visit (INDEPENDENT_AMBULATORY_CARE_PROVIDER_SITE_OTHER): Payer: BC Managed Care – PPO | Admitting: Family Medicine

## 2022-02-09 ENCOUNTER — Emergency Department (HOSPITAL_COMMUNITY): Payer: BC Managed Care – PPO

## 2022-02-09 ENCOUNTER — Inpatient Hospital Stay (HOSPITAL_COMMUNITY)
Admission: EM | Admit: 2022-02-09 | Discharge: 2022-02-12 | DRG: 641 | Disposition: A | Discharge status: Home / Self Care | Payer: BC Managed Care – PPO | Source: Ambulatory Visit | Attending: Internal Medicine | Admitting: Internal Medicine

## 2022-02-09 ENCOUNTER — Other Ambulatory Visit: Payer: Self-pay

## 2022-02-09 ENCOUNTER — Encounter: Payer: Self-pay | Admitting: Family Medicine

## 2022-02-09 ENCOUNTER — Encounter (HOSPITAL_COMMUNITY): Payer: Self-pay | Admitting: Internal Medicine

## 2022-02-09 VITALS — BP 132/82 | HR 116 | Temp 97.7°F | Resp 18 | Ht 68.0 in

## 2022-02-09 DIAGNOSIS — F1010 Alcohol abuse, uncomplicated: Secondary | ICD-10-CM | POA: Diagnosis present

## 2022-02-09 DIAGNOSIS — G9340 Encephalopathy, unspecified: Secondary | ICD-10-CM | POA: Diagnosis not present

## 2022-02-09 DIAGNOSIS — E039 Hypothyroidism, unspecified: Secondary | ICD-10-CM | POA: Diagnosis present

## 2022-02-09 DIAGNOSIS — Z8249 Family history of ischemic heart disease and other diseases of the circulatory system: Secondary | ICD-10-CM

## 2022-02-09 DIAGNOSIS — M0579 Rheumatoid arthritis with rheumatoid factor of multiple sites without organ or systems involvement: Secondary | ICD-10-CM | POA: Diagnosis present

## 2022-02-09 DIAGNOSIS — I10 Essential (primary) hypertension: Secondary | ICD-10-CM | POA: Diagnosis not present

## 2022-02-09 DIAGNOSIS — G629 Polyneuropathy, unspecified: Secondary | ICD-10-CM | POA: Diagnosis not present

## 2022-02-09 DIAGNOSIS — Z79899 Other long term (current) drug therapy: Secondary | ICD-10-CM

## 2022-02-09 DIAGNOSIS — F1020 Alcohol dependence, uncomplicated: Secondary | ICD-10-CM | POA: Diagnosis present

## 2022-02-09 DIAGNOSIS — Z20822 Contact with and (suspected) exposure to covid-19: Secondary | ICD-10-CM | POA: Diagnosis present

## 2022-02-09 DIAGNOSIS — R9431 Abnormal electrocardiogram [ECG] [EKG]: Secondary | ICD-10-CM | POA: Diagnosis present

## 2022-02-09 DIAGNOSIS — R748 Abnormal levels of other serum enzymes: Secondary | ICD-10-CM | POA: Diagnosis present

## 2022-02-09 DIAGNOSIS — M6281 Muscle weakness (generalized): Secondary | ICD-10-CM | POA: Diagnosis not present

## 2022-02-09 DIAGNOSIS — F17210 Nicotine dependence, cigarettes, uncomplicated: Secondary | ICD-10-CM | POA: Diagnosis present

## 2022-02-09 DIAGNOSIS — R1310 Dysphagia, unspecified: Secondary | ICD-10-CM

## 2022-02-09 DIAGNOSIS — E7800 Pure hypercholesterolemia, unspecified: Secondary | ICD-10-CM | POA: Diagnosis present

## 2022-02-09 DIAGNOSIS — D696 Thrombocytopenia, unspecified: Secondary | ICD-10-CM | POA: Diagnosis present

## 2022-02-09 DIAGNOSIS — R531 Weakness: Secondary | ICD-10-CM

## 2022-02-09 DIAGNOSIS — E876 Hypokalemia: Secondary | ICD-10-CM | POA: Diagnosis not present

## 2022-02-09 DIAGNOSIS — Z793 Long term (current) use of hormonal contraceptives: Secondary | ICD-10-CM

## 2022-02-09 DIAGNOSIS — E781 Pure hyperglyceridemia: Secondary | ICD-10-CM | POA: Diagnosis present

## 2022-02-09 DIAGNOSIS — F330 Major depressive disorder, recurrent, mild: Secondary | ICD-10-CM | POA: Diagnosis present

## 2022-02-09 DIAGNOSIS — D638 Anemia in other chronic diseases classified elsewhere: Secondary | ICD-10-CM

## 2022-02-09 DIAGNOSIS — K219 Gastro-esophageal reflux disease without esophagitis: Secondary | ICD-10-CM | POA: Diagnosis present

## 2022-02-09 DIAGNOSIS — Z880 Allergy status to penicillin: Secondary | ICD-10-CM

## 2022-02-09 LAB — BASIC METABOLIC PANEL
Anion gap: 19 — ABNORMAL HIGH (ref 5–15)
Anion gap: 20 — ABNORMAL HIGH (ref 5–15)
BUN: 15 mg/dL (ref 6–20)
BUN: 13 mg/dL (ref 6–20)
CO2: 26 mmol/L (ref 22–32)
CO2: 25 mmol/L (ref 22–32)
Calcium: 7.1 mg/dL — ABNORMAL LOW (ref 8.9–10.3)
Calcium: 7.2 mg/dL — ABNORMAL LOW (ref 8.9–10.3)
Chloride: 94 mmol/L — ABNORMAL LOW (ref 98–111)
Chloride: 98 mmol/L (ref 98–111)
Creatinine, Ser: 0.68 mg/dL (ref 0.44–1.00)
Creatinine, Ser: 0.78 mg/dL (ref 0.44–1.00)
GFR, Estimated: >60 mL/min (ref >60)
GFR, Estimated: >60 mL/min (ref >60)
Glucose, Bld: 106 mg/dL — ABNORMAL HIGH (ref 70–99)
Glucose, Bld: 102 mg/dL — ABNORMAL HIGH (ref 70–99)
Potassium: <2 mmol/L — CL (ref 3.5–5.1)
Potassium: <2 mmol/L — CL (ref 3.5–5.1)
Sodium: 139 mmol/L (ref 135–145)
Sodium: 143 mmol/L (ref 135–145)

## 2022-02-09 LAB — COMPREHENSIVE METABOLIC PANEL
ALT: 44 U/L (ref 0–44)
AST: 168 U/L — ABNORMAL HIGH (ref 15–41)
Albumin: 4.1 g/dL (ref 3.5–5.0)
Alkaline Phosphatase: 100 U/L (ref 38–126)
Anion gap: 24 — ABNORMAL HIGH (ref 5–15)
BUN: 15 mg/dL (ref 6–20)
CO2: 27 mmol/L (ref 22–32)
Calcium: 7.7 mg/dL — ABNORMAL LOW (ref 8.9–10.3)
Chloride: 86 mmol/L — ABNORMAL LOW (ref 98–111)
Creatinine, Ser: 0.79 mg/dL (ref 0.44–1.00)
GFR, Estimated: >60 mL/min (ref >60)
Glucose, Bld: 129 mg/dL — ABNORMAL HIGH (ref 70–99)
Potassium: <2 mmol/L — CL (ref 3.5–5.1)
Sodium: 137 mmol/L (ref 135–145)
Total Bilirubin: 3.1 mg/dL — ABNORMAL HIGH (ref 0.3–1.2)
Total Protein: 8.7 g/dL — ABNORMAL HIGH (ref 6.5–8.1)

## 2022-02-09 LAB — CBC WITH DIFFERENTIAL/PLATELET
Abs Immature Granulocytes: 0.04 10*3/uL (ref 0.00–0.07)
Basophils Absolute: 0 10*3/uL (ref 0.0–0.1)
Basophils Relative: 0 %
Eosinophils Absolute: 0 10*3/uL (ref 0.0–0.5)
Eosinophils Relative: 0 %
HCT: 33.3 % — ABNORMAL LOW (ref 36.0–46.0)
Hemoglobin: 11.8 g/dL — ABNORMAL LOW (ref 12.0–15.0)
Immature Granulocytes: 1 %
Lymphocytes Relative: 8 %
Lymphs Abs: 0.5 10*3/uL — ABNORMAL LOW (ref 0.7–4.0)
MCH: 33.9 pg (ref 26.0–34.0)
MCHC: 35.4 g/dL (ref 30.0–36.0)
MCV: 95.7 fL (ref 80.0–100.0)
Monocytes Absolute: 0.7 10*3/uL (ref 0.1–1.0)
Monocytes Relative: 10 %
Neutro Abs: 5.2 10*3/uL (ref 1.7–7.7)
Neutrophils Relative %: 81 %
Platelets: 105 10*3/uL — ABNORMAL LOW (ref 150–400)
RBC: 3.48 MIL/uL — ABNORMAL LOW (ref 3.87–5.11)
RDW: 13 % (ref 11.5–15.5)
WBC: 6.4 10*3/uL (ref 4.0–10.5)
nRBC: 0 % (ref 0.0–0.2)

## 2022-02-09 LAB — RAPID URINE DRUG SCREEN, HOSP PERFORMED
Amphetamines: NOT DETECTED
Barbiturates: NOT DETECTED
Benzodiazepines: NOT DETECTED
Cocaine: NOT DETECTED
Opiates: NOT DETECTED
Tetrahydrocannabinol: NOT DETECTED

## 2022-02-09 LAB — RESP PANEL BY RT-PCR (FLU A&B, COVID) ARPGX2
Influenza A by PCR: NEGATIVE
Influenza B by PCR: NEGATIVE
SARS Coronavirus 2 by RT PCR: NEGATIVE

## 2022-02-09 LAB — URINALYSIS, ROUTINE W REFLEX MICROSCOPIC
Bilirubin Urine: NEGATIVE
Glucose, UA: NEGATIVE mg/dL
Ketones, ur: 20 mg/dL — AB
Leukocytes,Ua: NEGATIVE
Nitrite: NEGATIVE
Protein, ur: NEGATIVE mg/dL
Specific Gravity, Urine: 1.009 (ref 1.005–1.030)
pH: 7 (ref 5.0–8.0)

## 2022-02-09 LAB — AMMONIA: Ammonia: 34 umol/L (ref 9–35)

## 2022-02-09 LAB — MAGNESIUM
Magnesium: 1.2 mg/dL — ABNORMAL LOW (ref 1.7–2.4)
Magnesium: 1.7 mg/dL (ref 1.7–2.4)

## 2022-02-09 LAB — HEMOGLOBIN A1C
Hgb A1c MFr Bld: 4.6 % — ABNORMAL LOW (ref 4.8–5.6)
Mean Plasma Glucose: 85.32 mg/dL

## 2022-02-09 LAB — ETHANOL: Alcohol, Ethyl (B): <10 mg/dL (ref <10)

## 2022-02-09 LAB — VITAMIN B12: Vitamin B-12: 381 pg/mL (ref 180–914)

## 2022-02-09 LAB — CBG MONITORING, ED: Glucose-Capillary: 133 mg/dL — ABNORMAL HIGH (ref 70–99)

## 2022-02-09 LAB — TSH: TSH: 1.685 u[IU]/mL (ref 0.350–4.500)

## 2022-02-09 IMAGING — MR MR HEAD WO/W CM
11 of 13 series · 31 of 48 positions shown · IV contrast (gadavist)
Comparison: Head CT [DATE]

CLINICAL DATA: Mental status change, unknown cause. Ataxia. Neck
weakness. Hallucinations. History of alcohol abuse with concern for
Wernicke encephalopathy.

EXAM:
MRI HEAD WITHOUT AND WITH CONTRAST
TECHNIQUE: Multiplanar, multiecho pulse sequences of the brain and surrounding
structures were obtained without and with intravenous contrast.
CONTRAST:  6mL GADAVIST GADOBUTROL 1 MMOL/ML IV SOLN

[Series 7: T2 · sagittal · 5.0mm · 0.47mm/px · 2 of 24 slices shown (1 of 3)]
[im 1/24]
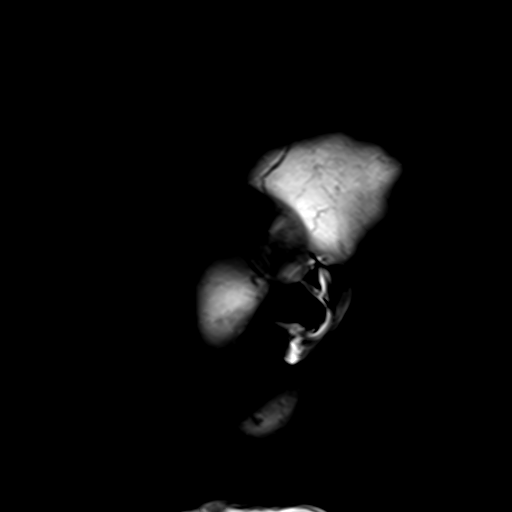
[im 24/24]
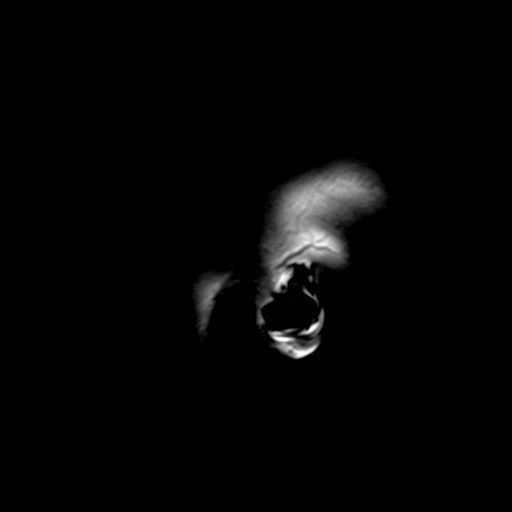

[Series 8: T2 · axial · 5.0mm · 0.45mm/px · z∈[-62,+78]mm · 2 of 24 slices shown (2 of 3)]
[im 1/24]
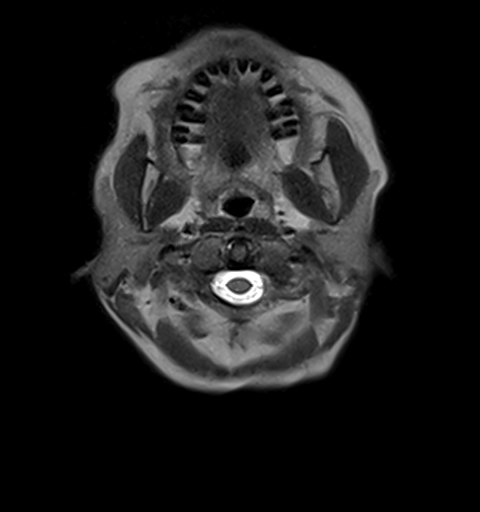
[im 24/24]
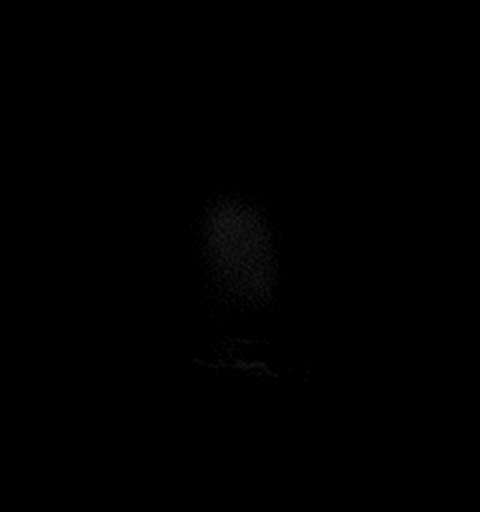

[Series 9: GRE · axial · 3.0mm · 0.45mm/px · z∈[-59,+73]mm · 4 of 48 slices shown]
[im 1/48]
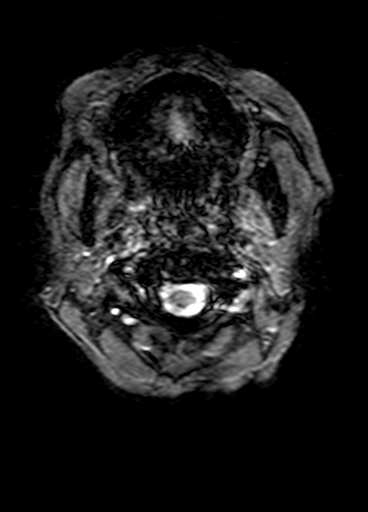
[im 16/48]
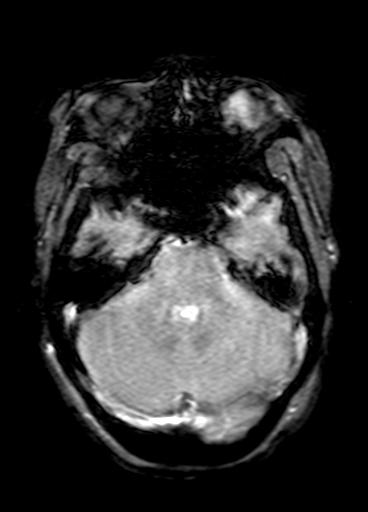
[im 32/48]
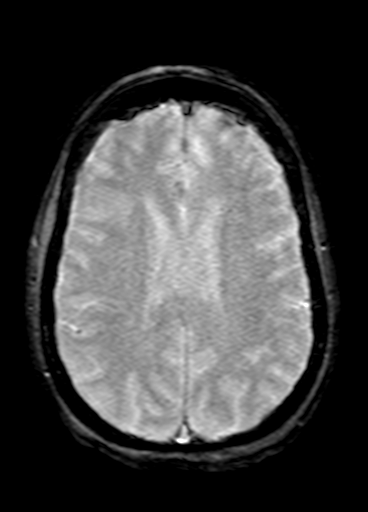
[im 48/48]
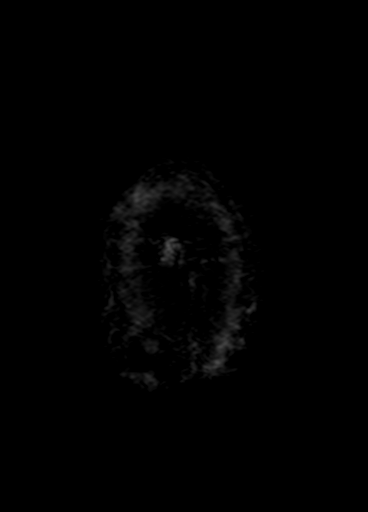

[Series 10: FLAIR · axial · 3.0mm · 0.86mm/px · z∈[-48,+78]mm · 4 of 46 slices shown]
[im 1/46]
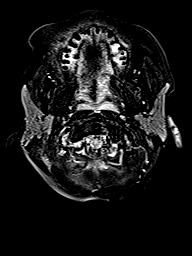
[im 16/46]
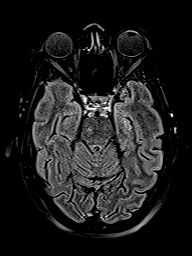
[im 31/46]
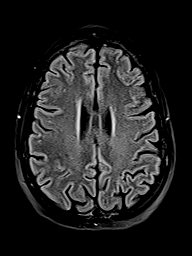
[im 46/46]
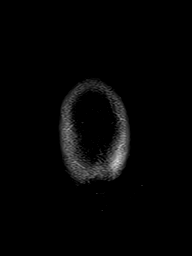

[Series 11: T1 · axial · 3.0mm · 0.45mm/px · z∈[-52,-10]mm · 2 of 48 slices shown]
[im 1/48]
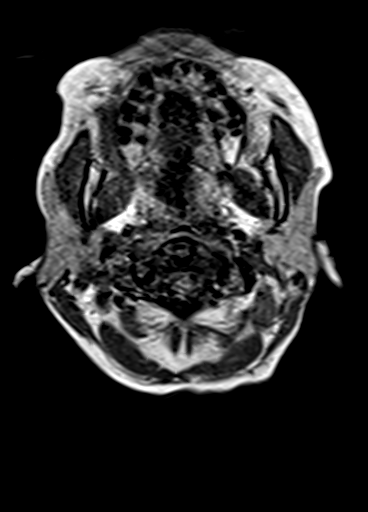
[im 16/48]
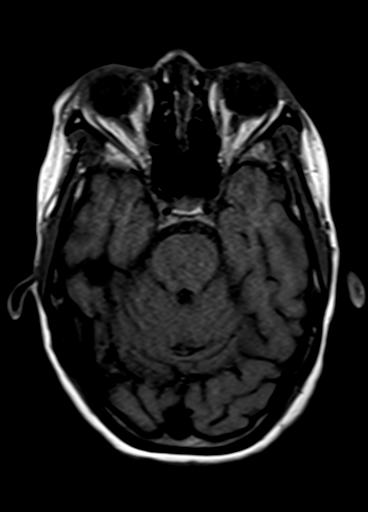

[Series 12: DWI · coronal · 5.0mm · 1.31mm/px · 5 of 52 slices shown (1 of 2)]
[im 1/52]
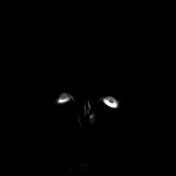
[im 13/52]
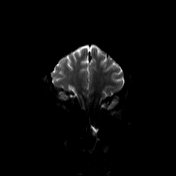
[im 26/52]
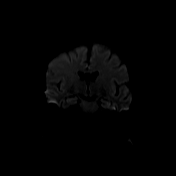
[im 39/52]
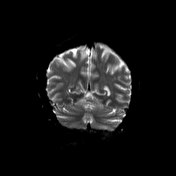
[im 52/52]
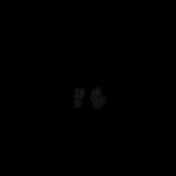

[Series 13: DWI · coronal · 5.0mm · 1.31mm/px · 2 of 26 slices shown (2 of 2)]
[im 1/26]
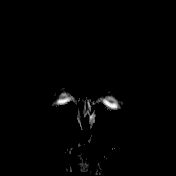
[im 26/26]
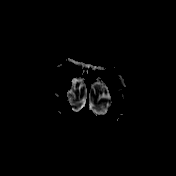

[Series 14: T2 · coronal · 5.0mm · 0.43mm/px · 2 of 24 slices shown (3 of 3)]
[im 1/24]
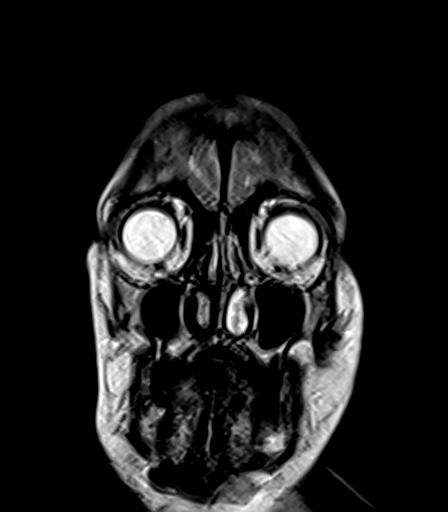
[im 24/24]
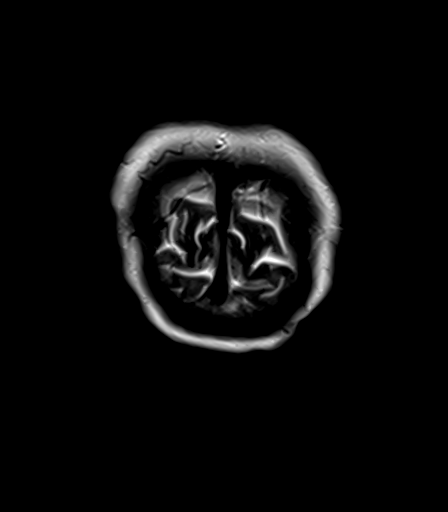

[Series 19: T1 post-contrast · axial · 3.0mm · 0.45mm/px · z∈[-87,+47]mm · 4 of 48 slices shown (1 of 3)]
[im 1/48]
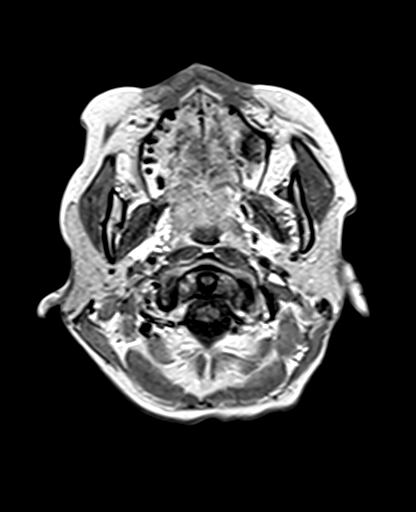
[im 16/48]
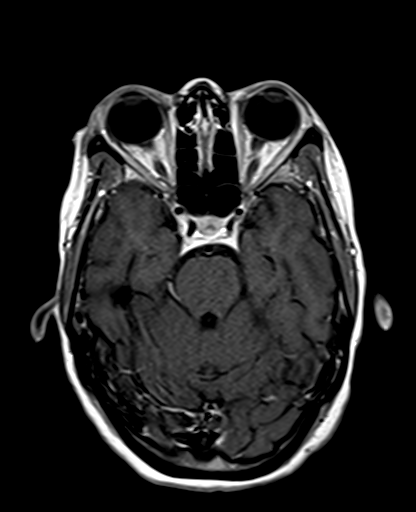
[im 32/48]
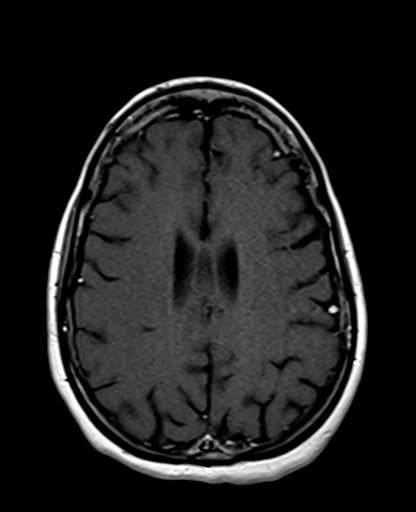
[im 48/48]
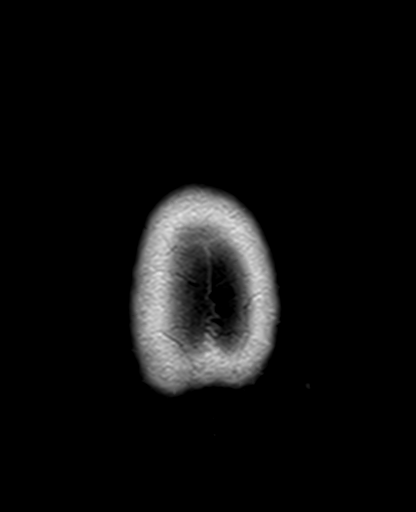

[Series 20: T1 post-contrast · coronal · 5.0mm · 0.43mm/px · 2 of 26 slices shown (2 of 3)]
[im 1/26]
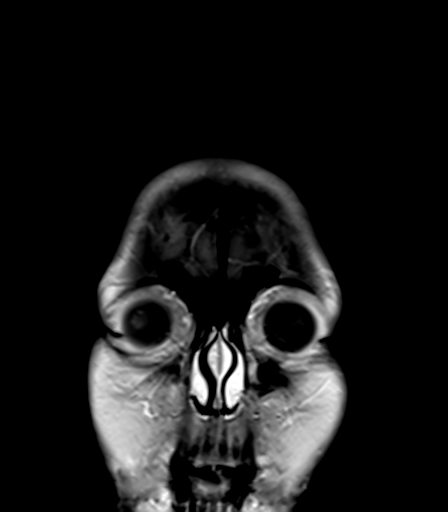
[im 26/26]
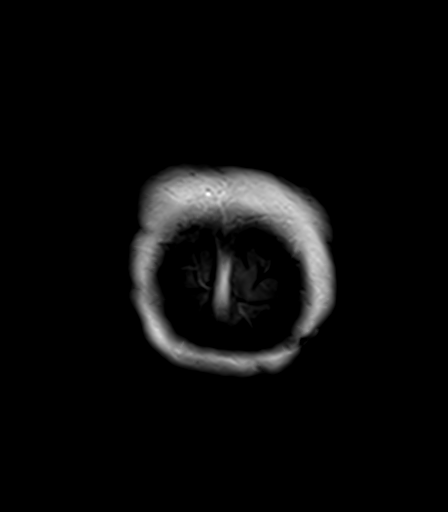

[Series 21: T1 post-contrast · sagittal · 5.0mm · 0.47mm/px · 2 of 24 slices shown (3 of 3)]
[im 1/24]
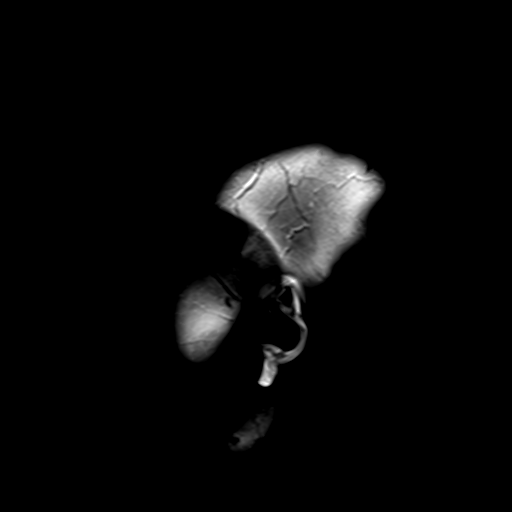
[im 24/24]
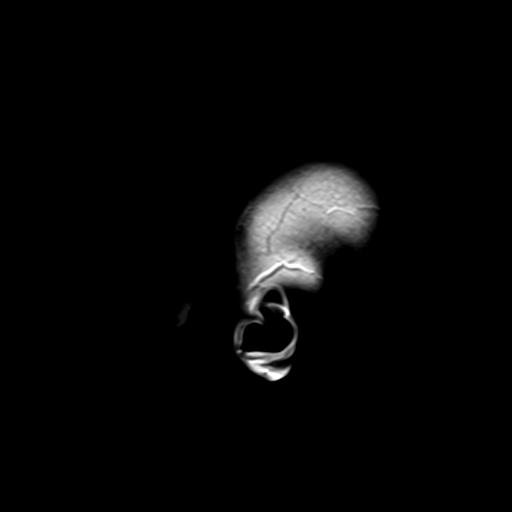

[31 of 48 positions shown; findings below may reference images not displayed]

FINDINGS: Brain: There is no evidence of an acute infarct, intracranial
hemorrhage, mass, midline shift, or extra-axial fluid collection.
Several small T2 hyperintensities are present in the subcortical
white matter of the frontal lobes, and there is also minimal T2
hyperintensity in the pons. No abnormal enhancement is identified. A
cavum septum pellucidum et vergae is noted. There is borderline age
advanced cerebellar atrophy. Supratentorial brain volume is within
normal limits. No abnormal signal is seen in the periaqueductal gray
matter, mamillary bodies, or thalami to indicate Wernicke
encephalopathy.

Vascular: Major intracranial vascular flow voids are preserved.

Skull and upper cervical spine: Unremarkable bone marrow signal.

Sinuses/Orbits: Unremarkable orbits. Paranasal sinuses and mastoid
air cells are clear.

Other: None.
IMPRESSION: 1. No acute intracranial abnormality.
2. Minimal T2 signal changes in the white matter, nonspecific though
may reflect early chronic small vessel ischemic disease, migraines,
or prior infection/inflammation.

## 2022-02-09 IMAGING — DX DG CHEST 1V PORT
1 series · 1 of 1 positions shown · non-contrast
Comparison: Chest radiograph [DATE]

CLINICAL DATA: Altered mental status

EXAM:
PORTABLE CHEST 1 VIEW

[chest ap]
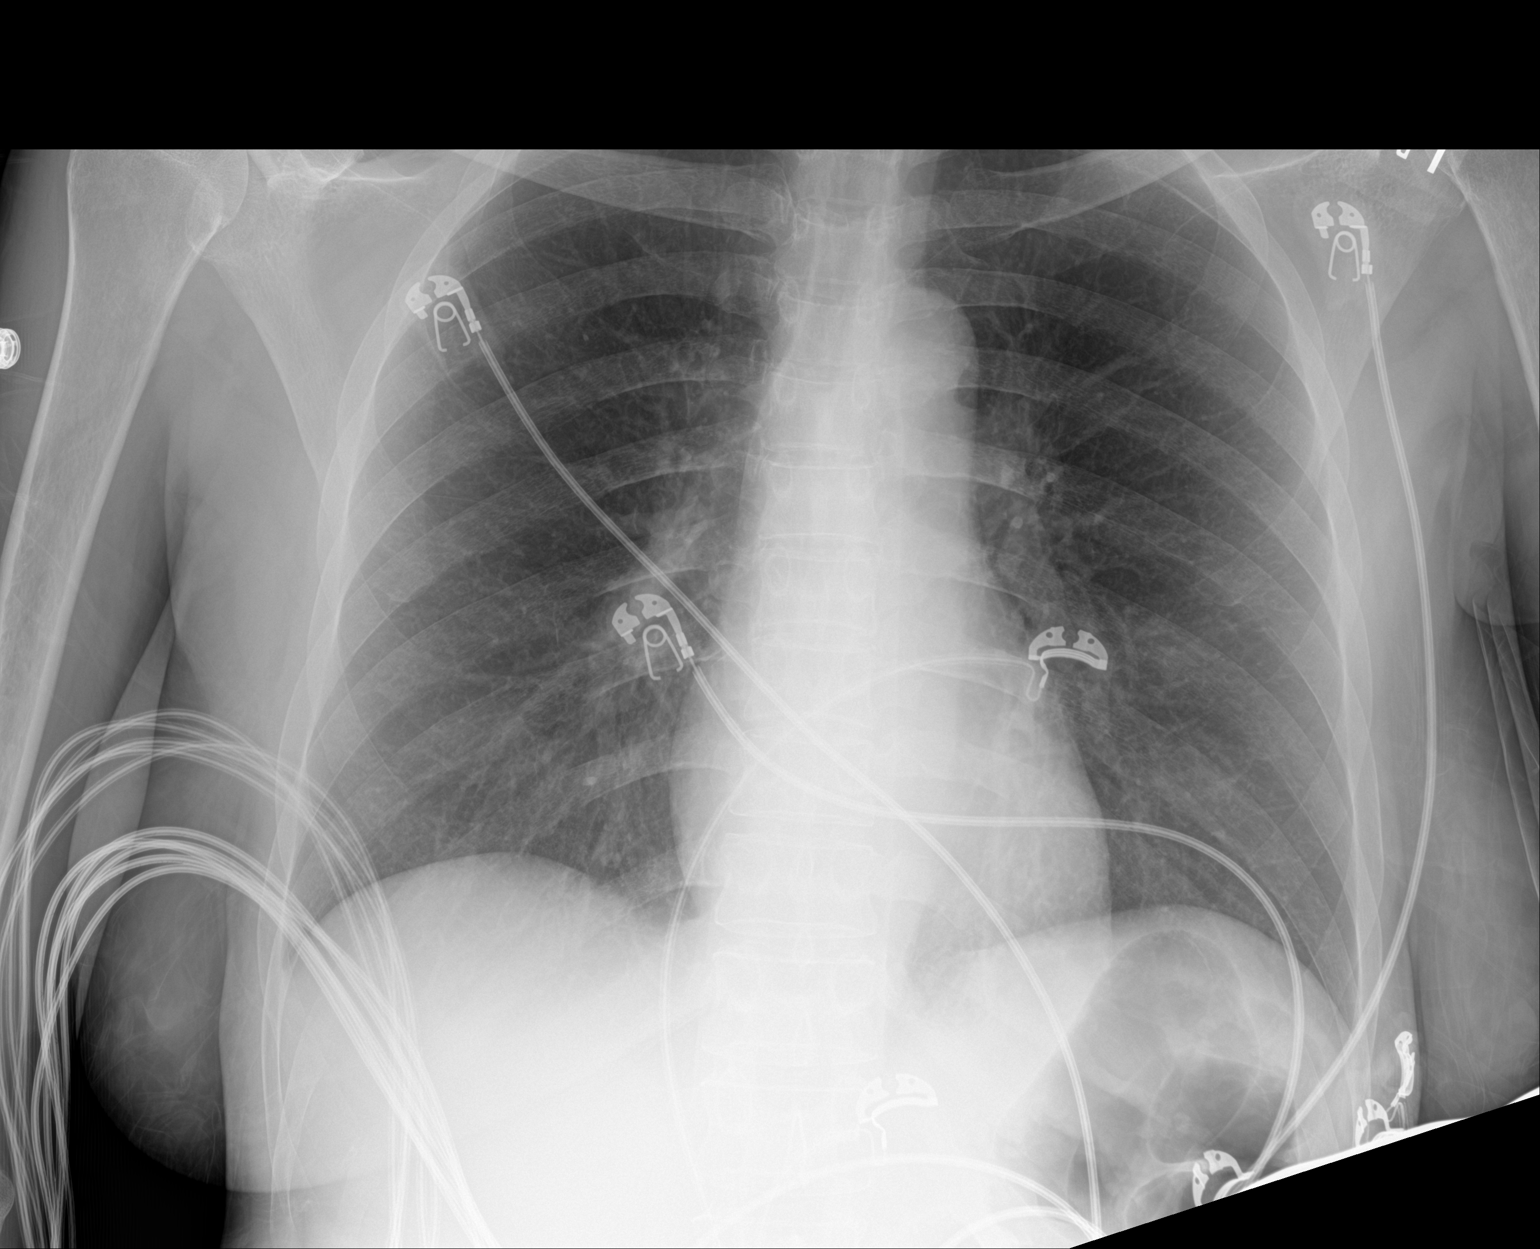

[1 of 1 positions shown; findings below may reference images not displayed]

FINDINGS: The cardiomediastinal silhouette is normal.

There is no focal consolidation or pulmonary edema. There is no
pleural effusion or pneumothorax.

There is no acute osseous abnormality.
IMPRESSION: No radiographic evidence of acute cardiopulmonary process.

## 2022-02-09 IMAGING — CT CT HEAD W/O CM
3 series · 16 of 47 positions shown, 19 images · non-contrast
Comparison: [DATE]

CLINICAL DATA: Mental status change, unknown cause



[Series 2: head wo · axial · 0.40mm/px · z∈[+1404,+1529]mm · 10 of 30 slices shown, 13 images]
[im 3/30  brain]
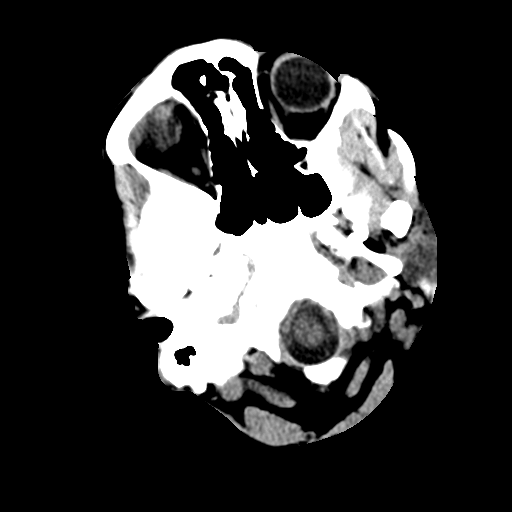
[im 3/30  bone]
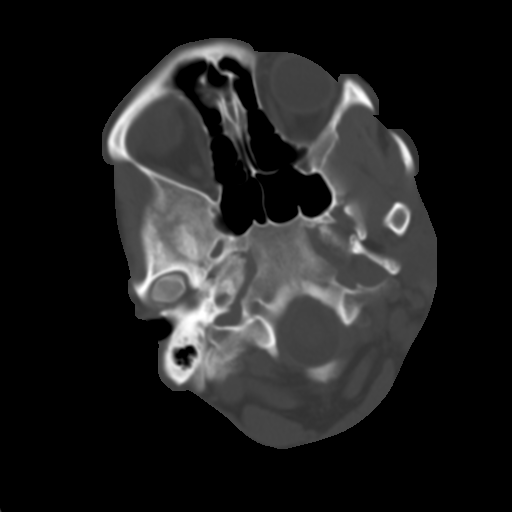
[im 6/30  brain]
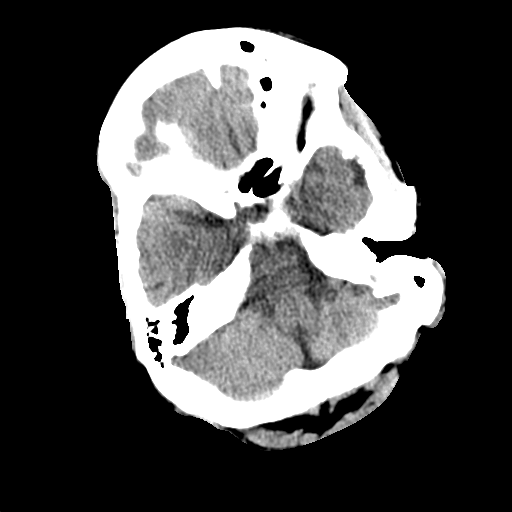
[im 9/30  brain]
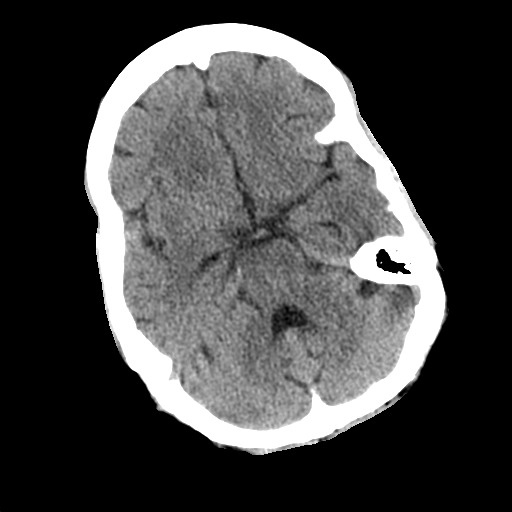
[im 11/30  brain]
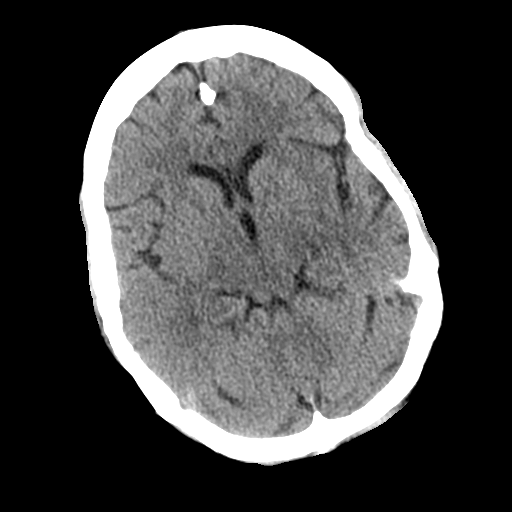
[im 14/30  brain]
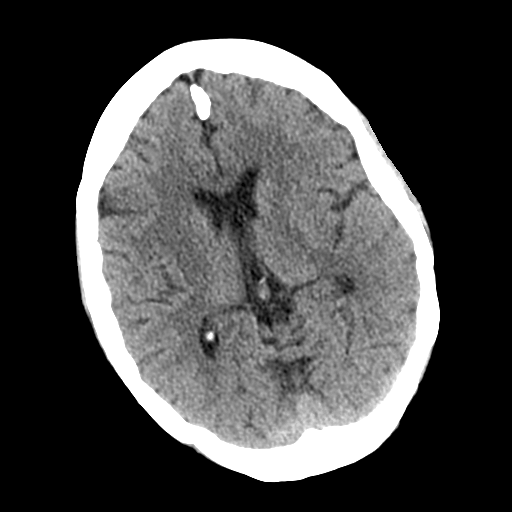
[im 14/30  bone]
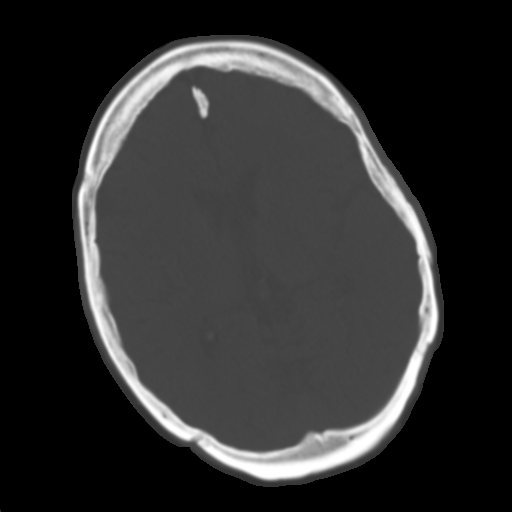
[im 17/30  brain]
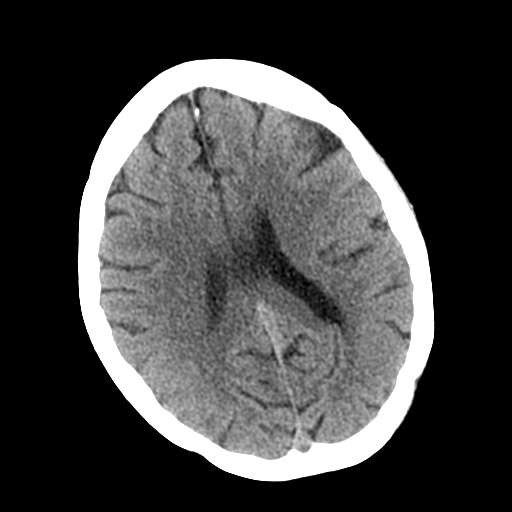
[im 20/30  brain]
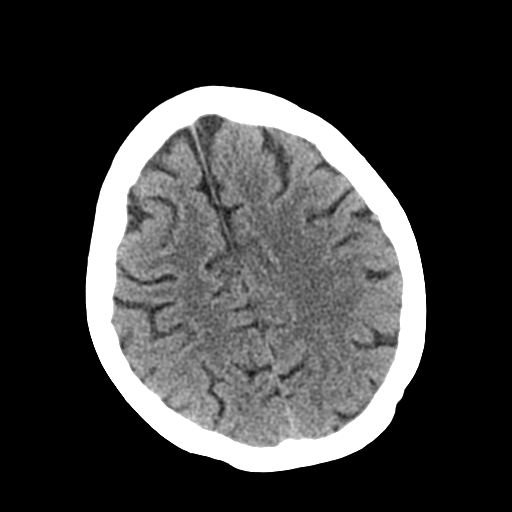
[im 23/30  brain]
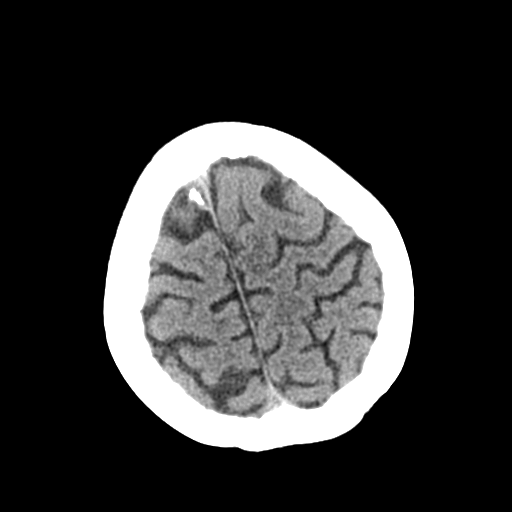
[im 25/30  brain]
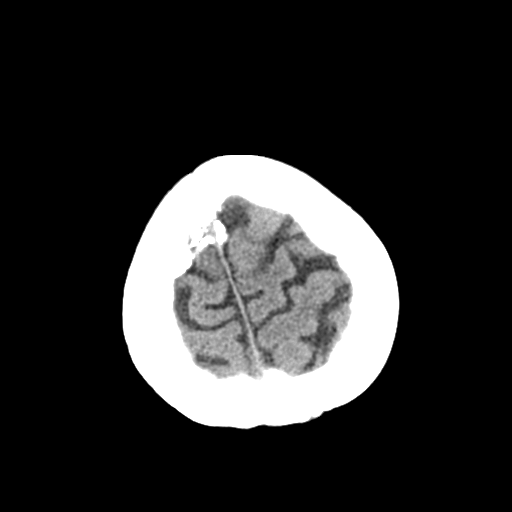
[im 25/30  bone]
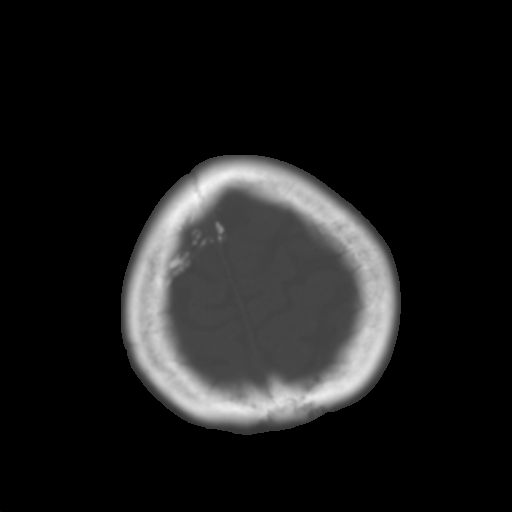
[im 28/30  brain]
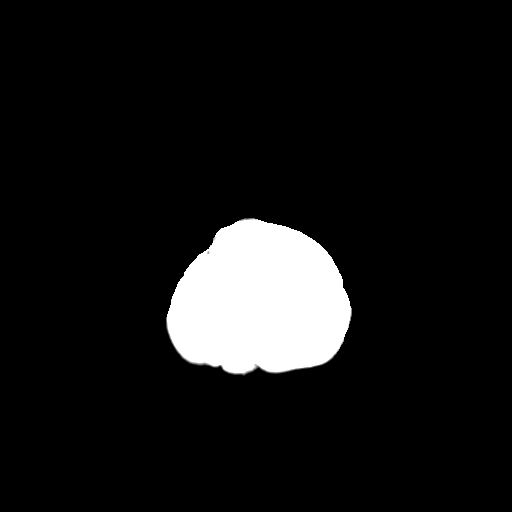

[Series 5: coronal soft tissue · coronal · 0.29mm/px · 3 of 62 slices shown]
[im 21/62  brain]
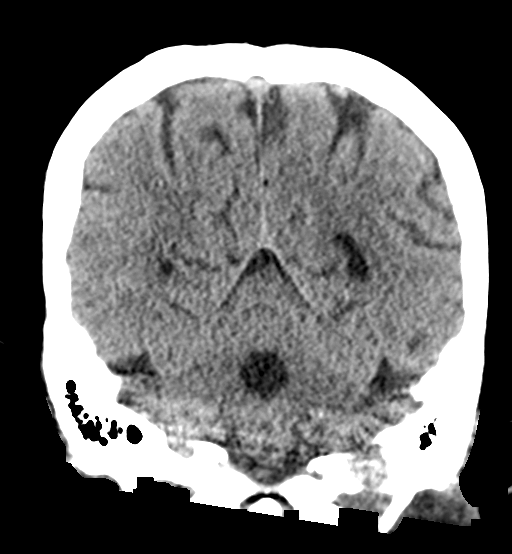
[im 28/62  brain]
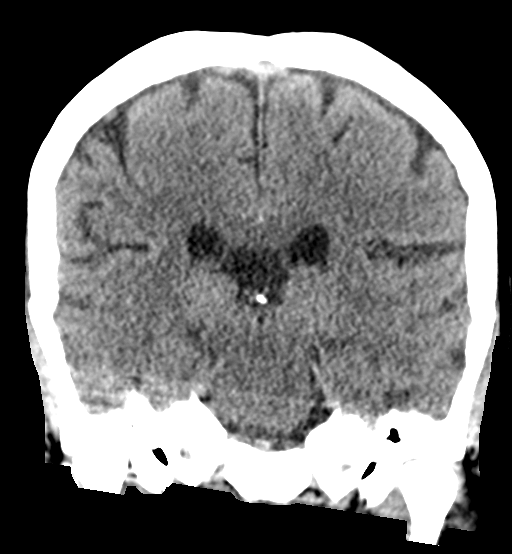
[im 34/62  brain]
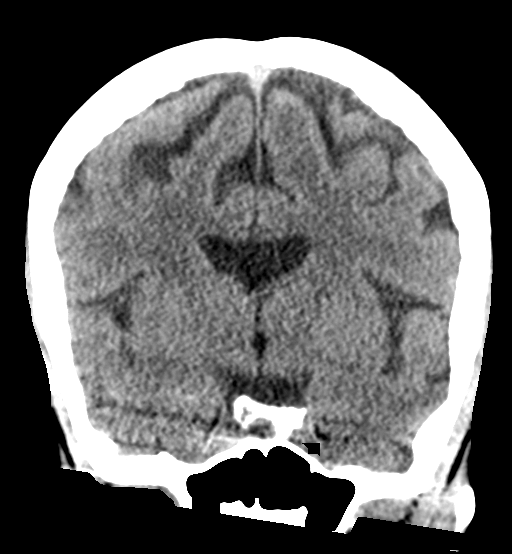

[Series 6: sagittal soft tissue · sagittal · 0.31mm/px · 3 of 49 slices shown]
[im 19/49  brain]
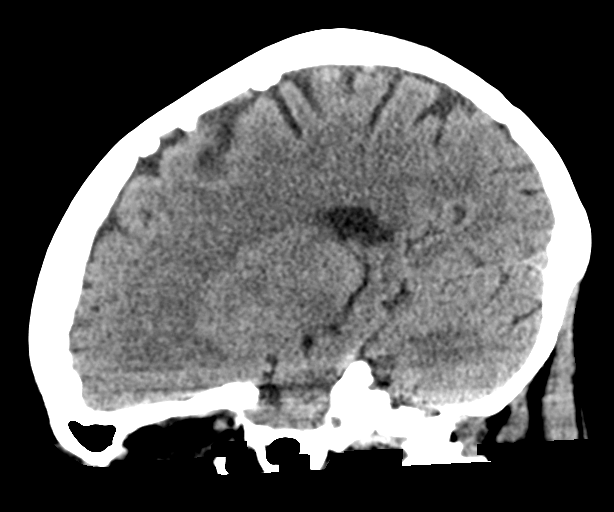
[im 25/49  brain]
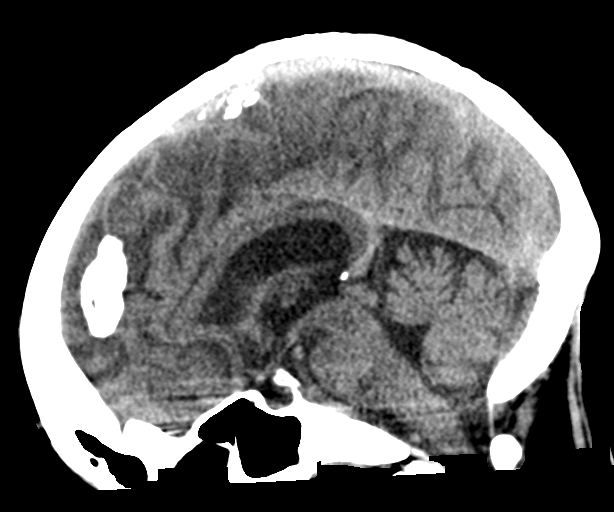
[im 30/49  brain]
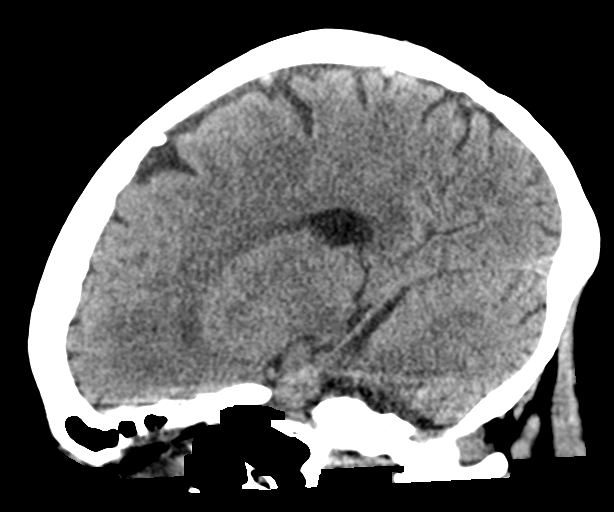

[16 of 47 positions shown; findings below may reference images not displayed]

FINDINGS: Brain: No evidence of acute infarction, hemorrhage, hydrocephalus,
extra-axial collection or mass lesion/mass effect. Cavum septum
pellucidum.

Vascular: No hyperdense vessel or unexpected calcification.

Skull: Normal. Negative for fracture or focal lesion.

Sinuses/Orbits: No acute finding.

Other: None.
IMPRESSION: No acute intracranial abnormality.

## 2022-02-09 IMAGING — MR MR CERVICAL SPINE WO/W CM
4 of 8 series · 19 of 48 positions shown · IV contrast (gadavist)
Comparison: None.

CLINICAL DATA: Ataxia. Neck weakness. Hallucinations. History of
alcohol abuse with concern for Wernicke encephalopathy.

EXAM:
MRI CERVICAL SPINE WITHOUT AND WITH CONTRAST
TECHNIQUE: Multiplanar and multiecho pulse sequences of the cervical spine, to
include the craniocervical junction and cervicothoracic junction,
were obtained without and with intravenous contrast.
CONTRAST:  6mL GADAVIST GADOBUTROL 1 MMOL/ML IV SOLN

[Series 5: T1 · sagittal · 3.0mm · 0.43mm/px · 4 of 15 slices shown (1 of 2)]
[im 1/15]
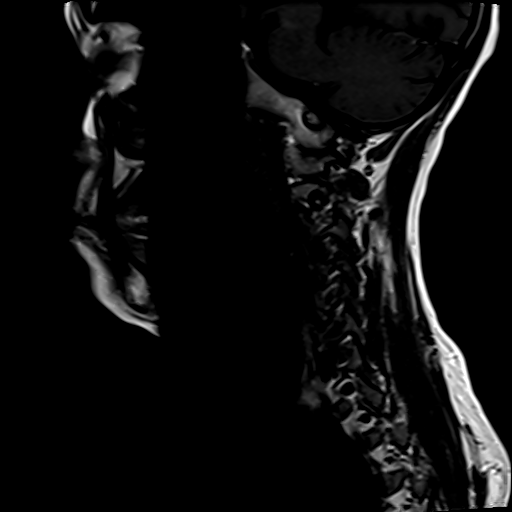
[im 5/15]
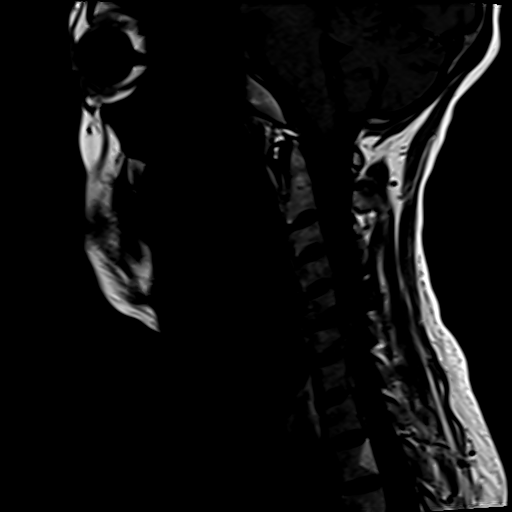
[im 10/15]
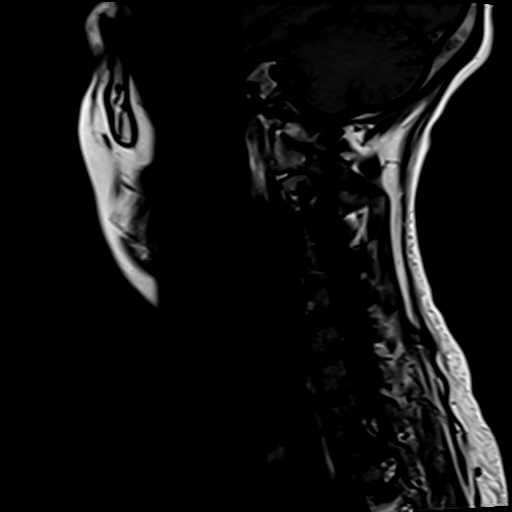
[im 15/15]
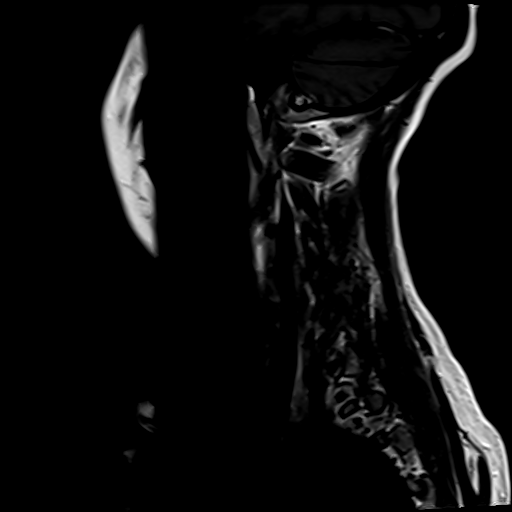

[Series 7: T2 · axial · 3.0mm · 0.35mm/px · z∈[-86,+17]mm · 8 of 32 slices shown]
[im 1/32]
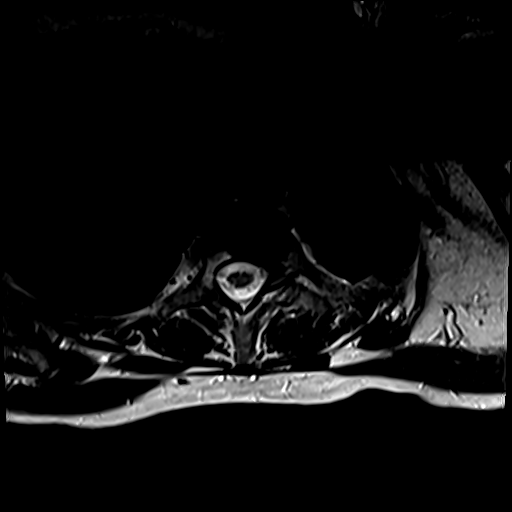
[im 5/32]
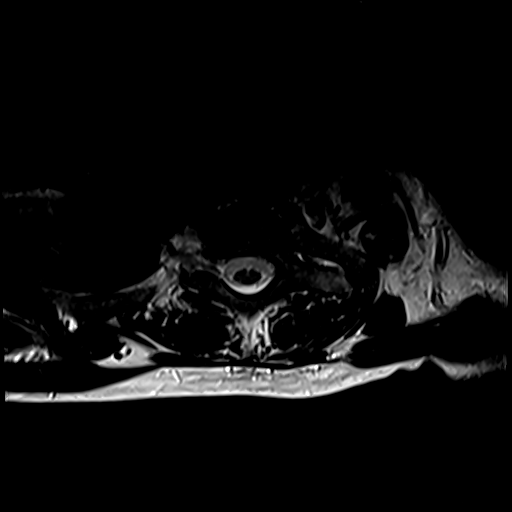
[im 9/32]
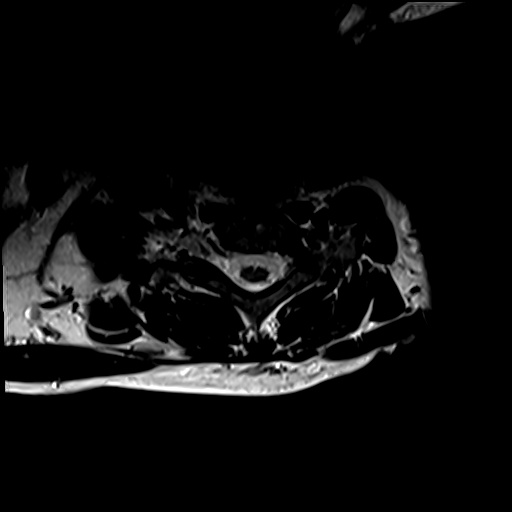
[im 14/32]
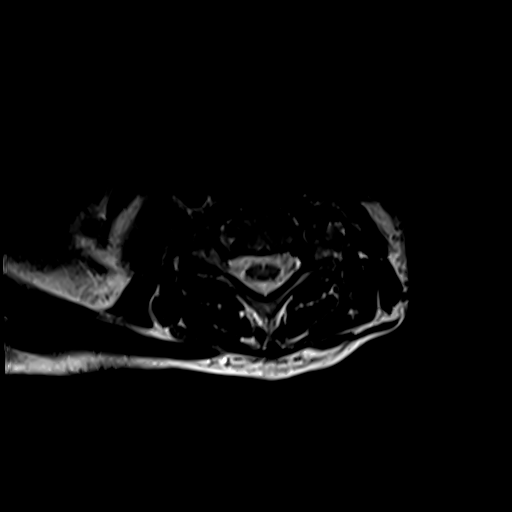
[im 18/32]
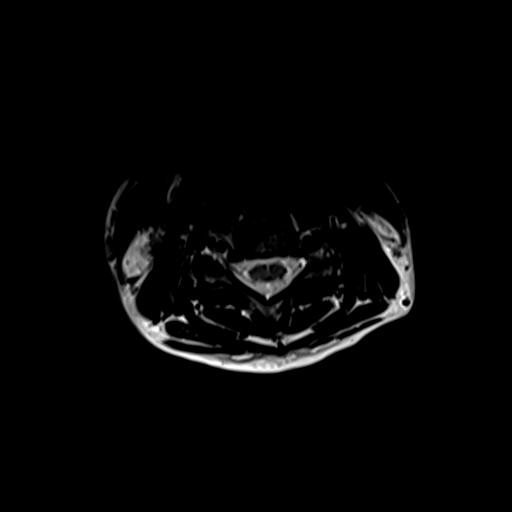
[im 23/32]
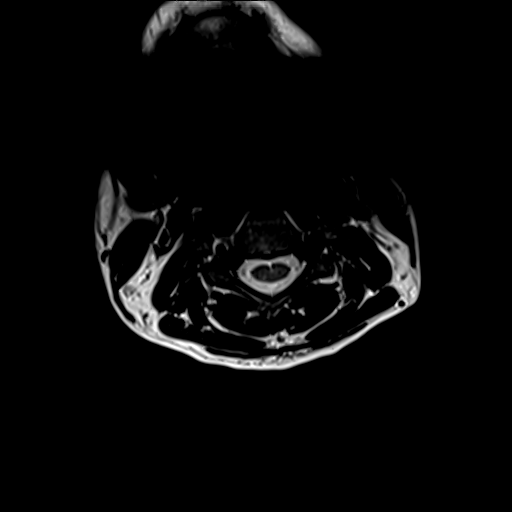
[im 27/32]
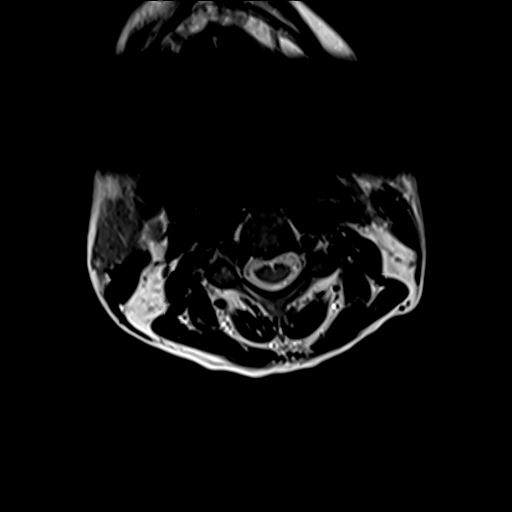
[im 32/32]
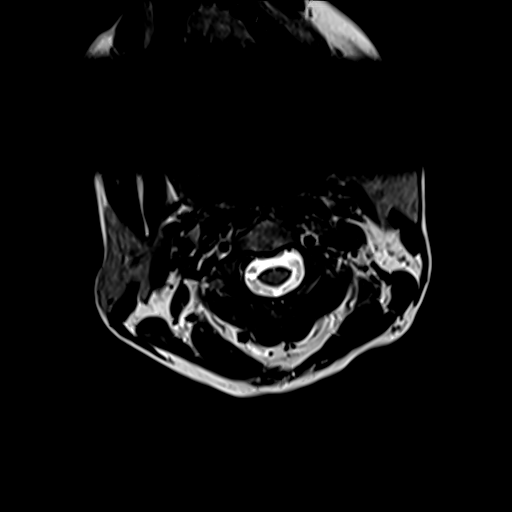

[Series 9: T1 · axial · 3.0mm · 0.35mm/px · z∈[-73,+0]mm · 3 of 32 slices shown (2 of 2)]
[im 5/32]
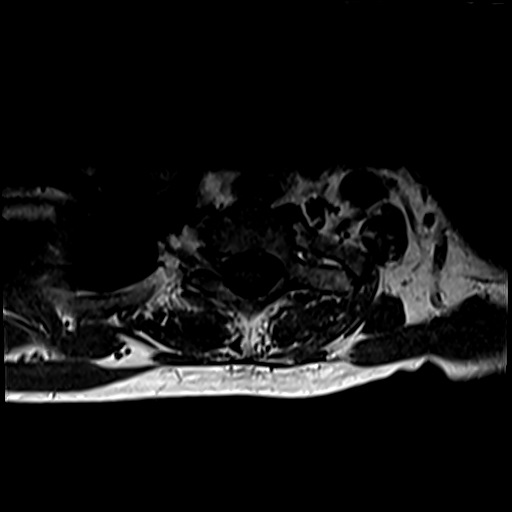
[im 18/32]
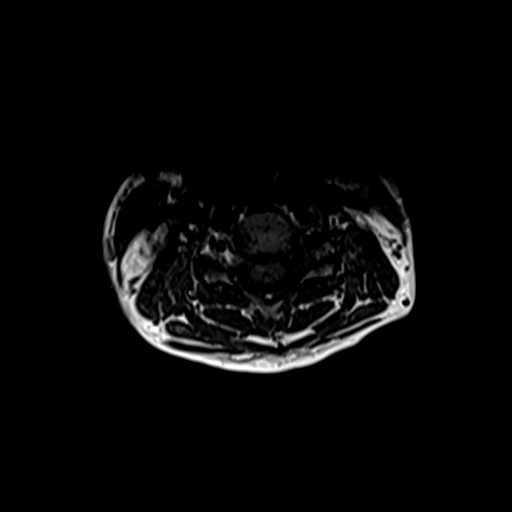
[im 27/32]
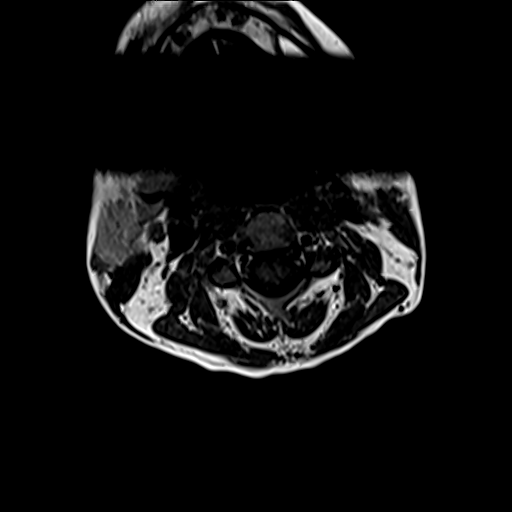

[Series 10: T2 post-contrast · sagittal · 3.0mm · 0.43mm/px · 4 of 15 slices shown]
[im 1/15]
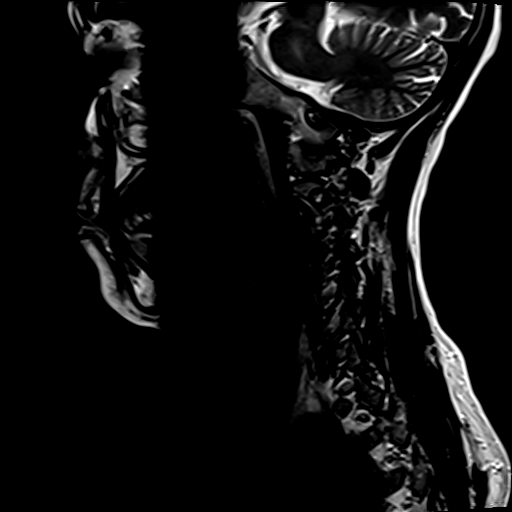
[im 5/15]
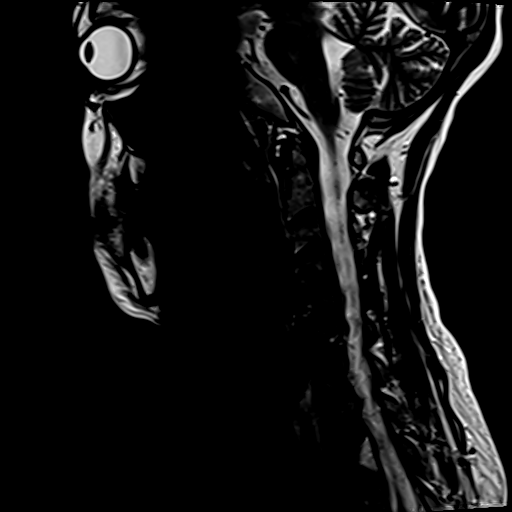
[im 10/15]
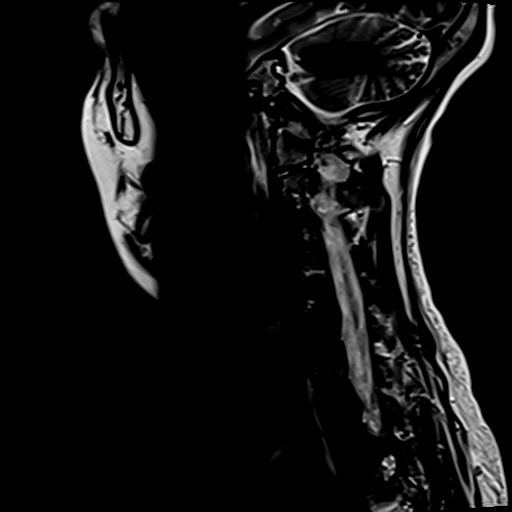
[im 15/15]
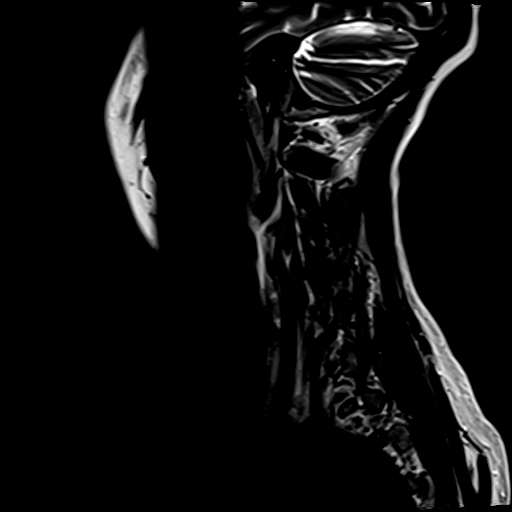

[19 of 48 positions shown; findings below may reference images not displayed]

FINDINGS: Alignment: Straightening of the normal cervical lordosis. No
listhesis.

Vertebrae: No fracture, suspicious marrow lesion, or significant
marrow edema. Hemangioma involving the posterior aspect of the T2
vertebral body and pedicle on the right.

Cord: Normal cord signal and morphology. No abnormal intradural
enhancement.

Posterior Fossa, vertebral arteries, paraspinal tissues: Posterior
fossa more fully evaluated on today's separate head MRI. Preserved
vertebral artery flow voids.

Disc levels:

Preserved disc space heights. Small central disc protrusion at C3-4
and minimal disc bulging at C4-5 and C5-6 without stenosis.
IMPRESSION: 1. Normal appearance of the cervical spinal cord.
2. Mild cervical spondylosis without stenosis.

## 2022-02-09 MED ORDER — POTASSIUM CHLORIDE 10 MEQ/100ML IV SOLN
10.0000 meq | INTRAVENOUS | Status: AC
  Administered 2022-02-09 (×4): 10 meq via INTRAVENOUS
  Filled 2022-02-09 (×4): qty 100

## 2022-02-09 MED ORDER — SPIRONOLACTONE 25 MG PO TABS
25.0000 mg | ORAL_TABLET | Freq: Every day | ORAL | Status: DC
  Administered 2022-02-10: 25 mg via ORAL
  Filled 2022-02-09 (×2): qty 1

## 2022-02-09 MED ORDER — SODIUM CHLORIDE 0.9 % IV BOLUS
1000.0000 mL | Freq: Once | INTRAVENOUS | Status: AC
  Administered 2022-02-09: 13:00:00 1000 mL via INTRAVENOUS

## 2022-02-09 MED ORDER — CYCLOSPORINE 0.05 % OP EMUL
1.0000 [drp] | Freq: Two times a day (BID) | OPHTHALMIC | Status: DC
  Administered 2022-02-10 – 2022-02-12 (×5): 1 [drp] via OPHTHALMIC
  Filled 2022-02-09 (×6): qty 30

## 2022-02-09 MED ORDER — THIAMINE HCL 100 MG PO TABS
100.0000 mg | ORAL_TABLET | Freq: Two times a day (BID) | ORAL | Status: DC
  Administered 2022-02-10 – 2022-02-12 (×5): 100 mg via ORAL
  Filled 2022-02-09 (×5): qty 1

## 2022-02-09 MED ORDER — POLYETHYLENE GLYCOL 3350 17 G PO PACK: 17.0000 g | PACK | Freq: Every day | ORAL | Status: DC | PRN

## 2022-02-09 MED ORDER — GADOBUTROL 1 MMOL/ML IV SOLN
6.0000 mL | Freq: Once | INTRAVENOUS | Status: AC | PRN
  Administered 2022-02-09: 15:00:00 6 mL via INTRAVENOUS

## 2022-02-09 MED ORDER — ACETAMINOPHEN 325 MG PO TABS: 650.0000 mg | ORAL_TABLET | Freq: Four times a day (QID) | ORAL | Status: DC | PRN

## 2022-02-09 MED ORDER — POTASSIUM CHLORIDE 10 MEQ/100ML IV SOLN
10.0000 meq | INTRAVENOUS | Status: AC
  Administered 2022-02-09 (×2): 10 meq via INTRAVENOUS
  Filled 2022-02-09 (×2): qty 100

## 2022-02-09 MED ORDER — POTASSIUM CHLORIDE 10 MEQ/100ML IV SOLN
10.0000 meq | INTRAVENOUS | Status: DC
  Administered 2022-02-09: 19:00:00 10 meq via INTRAVENOUS
  Filled 2022-02-09: qty 100

## 2022-02-09 MED ORDER — PANTOPRAZOLE SODIUM 40 MG PO TBEC
40.0000 mg | DELAYED_RELEASE_TABLET | Freq: Two times a day (BID) | ORAL | Status: DC
Start: 2022-02-09 — End: 2022-02-12
  Administered 2022-02-10 – 2022-02-12 (×5): 40 mg via ORAL
  Filled 2022-02-09 (×5): qty 1

## 2022-02-09 MED ORDER — POTASSIUM CHLORIDE CRYS ER 20 MEQ PO TBCR
40.0000 meq | EXTENDED_RELEASE_TABLET | Freq: Once | ORAL | Status: DC
  Filled 2022-02-09: qty 2

## 2022-02-09 MED ORDER — SODIUM CHLORIDE 0.9% FLUSH
3.0000 mL | Freq: Two times a day (BID) | INTRAVENOUS | Status: DC
  Administered 2022-02-10 – 2022-02-12 (×3): 3 mL via INTRAVENOUS

## 2022-02-09 MED ORDER — ACETAMINOPHEN 650 MG RE SUPP: 650.0000 mg | Freq: Four times a day (QID) | RECTAL | Status: DC | PRN

## 2022-02-09 MED ORDER — ENOXAPARIN SODIUM 40 MG/0.4ML IJ SOSY
40.0000 mg | PREFILLED_SYRINGE | INTRAMUSCULAR | Status: DC
  Administered 2022-02-09 – 2022-02-11 (×3): 40 mg via SUBCUTANEOUS
  Filled 2022-02-09 (×3): qty 0.4

## 2022-02-09 MED ORDER — FOLIC ACID 1 MG PO TABS
1.0000 mg | ORAL_TABLET | Freq: Every day | ORAL | Status: DC
  Administered 2022-02-10 – 2022-02-12 (×3): 1 mg via ORAL
  Filled 2022-02-09 (×3): qty 1

## 2022-02-09 MED ORDER — MAGNESIUM SULFATE 2 GM/50ML IV SOLN
2.0000 g | Freq: Once | INTRAVENOUS | Status: AC
  Administered 2022-02-09: 16:00:00 2 g via INTRAVENOUS
  Filled 2022-02-09: qty 50

## 2022-02-09 MED ORDER — LORAZEPAM 2 MG/ML IJ SOLN
1.0000 mg | Freq: Once | INTRAMUSCULAR | Status: AC | PRN
  Administered 2022-02-09: 14:00:00 1 mg via INTRAVENOUS
  Filled 2022-02-09: qty 1

## 2022-02-09 NOTE — ED Notes (Signed)
K under 2.0, MD Wentz aware.  ?

## 2022-02-09 NOTE — ED Notes (Signed)
Date and time results received: 02/09/22 1:02 PM ? ?(use smartphrase ".now" to insert current time) ? ?Test: K+ ?Critical Value: <2.0 ? ?Name of Provider Notified: Dr. Gilford Raid ? ?Orders Received? Or Actions Taken?: see chart ?

## 2022-02-09 NOTE — Progress Notes (Addendum)
UPDATE ? ?Patient again failed bedside swallow screen.  Given she also reports difficulty swallowing larger pills causing her not to take them.  We will make n.p.o. for now and get formal SLP evaluation in the morning. ? ?UPDATE 2 ? ?>Repeat potassium has remained less than 2.  Has received 60 mEq IV.  Unable to tolerate p.o.'s has not received dose 40 mEq.  I have reordered additional 60 mEq IV because even if potassium measures greater than 2 will need at least that much for continued repletion and likely much more. ?> I have also ordered additional 1 g of magnesium as has returned at low normal at 1.7. ?- Continue IV potassium repletion, continue to trend BMP ?- 1 g additional magnesium IV, recheck magnesium in the morning ?

## 2022-02-09 NOTE — Progress Notes (Signed)
Subjective:    Patient ID: Samantha Rosales, female    DOB: 1981-11-11, 41 y.o.   MRN: [id number]  HPI  07/05/21 Recently received word from her rheumatologist that her liver function tests were elevated.  She is no longer on methotrexate and is currently on Humira for rheumatoid arthritis.  She does have a history of heavy alcohol use and had an episode of pancreatitis requiring hospital admission in the past.  This has been the assumption as to the cause of her elevated liver function test.  She is even seeing gastroenterology.  Approximately 3 weeks ago they recommended that she try to wean away from alcohol which she has been slowly doing.  She states that she is currently drinking 1 beer a day.  She does have a rosacea like rash on her cheeks and on her nasal bridge which she states has been attributed to rosacea in the past.  He is also still on fenofibrate.  2 years ago she had negative anti-smooth muscle antibodies and negative antiliver kidney microsomal antibodies and an evaluation for autoimmune hepatitis given that she had a positive ANA.  At that time, my plan was: Patient states that she has lost weight due to the fact that every time she eats she becomes nauseated and has diarrhea.  I believe that this is likely a sign of pancreatic insufficiency due to underlying chronic pancreatitis.  Repeat CBC CMP today.  If liver function test are elevated, obtain an ultrasound of the liver to rule out malignancy in the liver.  Also check sed rate ANA antiliver kidney microsomal antibody and also anti-smooth muscle antibody to rule out autoimmune hepatitis.  Discontinue fenofibrate.  Depending upon lab results, likely will start the patient on pancreatic enzyme supplementation.  02/09/22 Admission on 09/07/2021, Discharged on 09/07/2021  Component Date Value Ref Range Status   Preg Test, Ur 09/07/2021 NEGATIVE  NEGATIVE Final   Comment:        THE SENSITIVITY OF THIS METHODOLOGY IS >20  mIU/mL. Performed at [facility], [address] [place], [place] [address]   Office Visit on 07/05/2021  Component Date Value Ref Range Status   WBC 07/05/2021 5.2  3.8 - 10.8 Thousand/uL Final   RBC 07/05/2021 3.08 (L)  3.80 - 5.10 Million/uL Final   Hemoglobin 07/05/2021 10.2 (L)  11.7 - 15.5 g/dL Final   HCT 07/05/2021 30.0 (L)  35.0 - 45.0 % Final   MCV 07/05/2021 97.4  80.0 - 100.0 fL Final   MCH 07/05/2021 33.1 (H)  27.0 - 33.0 pg Final   MCHC 07/05/2021 34.0  32.0 - 36.0 g/dL Final   RDW 07/05/2021 13.6  11.0 - 15.0 % Final   Platelets 07/05/2021 136 (L)  140 - 400 Thousand/uL Final   MPV 07/05/2021 12.3  7.5 - 12.5 fL Final   Neutro Abs 07/05/2021 3,292  1,500 - 7,800 cells/uL Final   Lymphs Abs 07/05/2021 1,534  850 - 3,900 cells/uL Final   Absolute Monocytes 07/05/2021 333  200 - 950 cells/uL Final   Eosinophils Absolute 07/05/2021 21  15 - 500 cells/uL Final   Basophils Absolute 07/05/2021 21  0 - 200 cells/uL Final   Neutrophils Relative % 07/05/2021 63.3  % Final   Total Lymphocyte 07/05/2021 29.5  % Final   Monocytes Relative 07/05/2021 6.4  % Final   Eosinophils Relative 07/05/2021 0.4  % Final   Basophils Relative 07/05/2021 0.4  % Final   Glucose, Bld 07/05/2021 88  65 -  99 mg/dL Final   Comment: .            Fasting reference interval .    BUN 07/05/2021 8  7 - 25 mg/dL Final   Creat 07/05/2021 0.58  0.50 - 0.99 mg/dL Final   eGFR 07/05/2021 117  > OR = 60 mL/min/1.66m Final   Comment: The eGFR is based on the CKD-EPI 2021 equation. To calculate  the new eGFR from a previous Creatinine or Cystatin C result, go to https://www.kidney.org/professionals/ kdoqi/gfr%5Fcalculator    BUN/Creatinine Ratio 097/94/8016NOT APPLICABLE  6 - 22 (calc) Final   Sodium 07/05/2021 138  135 - 146 mmol/L Final   Potassium 07/05/2021 3.0 (L)  3.5 - 5.3 mmol/L Final   Chloride 07/05/2021 100  98 - 110 mmol/L Final   CO2 07/05/2021 26  20 - 32 mmol/L Final   Calcium 07/05/2021  8.5 (L)  8.6 - 10.2 mg/dL Final   Total Protein 07/05/2021 7.1  6.1 - 8.1 g/dL Final   Albumin 07/05/2021 4.1  3.6 - 5.1 g/dL Final   Globulin 07/05/2021 3.0  1.9 - 3.7 g/dL (calc) Final   AG Ratio 07/05/2021 1.4  1.0 - 2.5 (calc) Final   Total Bilirubin 07/05/2021 1.3 (H)  0.2 - 1.2 mg/dL Final   Alkaline phosphatase (APISO) 07/05/2021 156 (H)  31 - 125 U/L Final   AST 07/05/2021 101 (H)  10 - 30 U/L Final   ALT 07/05/2021 41 (H)  6 - 29 U/L Final   Sed Rate 07/05/2021 72 (H)  0 - 20 mm/h Final   Anti Nuclear Antibody (ANA) 07/05/2021 NEGATIVE  NEGATIVE Final   Comment: ANA IFA is a first line screen for detecting the presence of up to approximately 150 autoantibodies in various autoimmune diseases. A negative ANA IFA result suggests an ANA-associated autoimmune disease is not present at this time, but is not definitive. If there is high clinical suspicion for Sjogren's syndrome, testing for anti-SS-A/Ro antibody should be considered. Anti-Jo-1 antibody should be considered for clinically suspected inflammatory myopathies. . AC-0: Negative . International Consensus on ANA Patterns (hhttps://www.hernandez-brewer.com/ . For additional information, please refer to http://education.[facility].com/faq/FAQ177 (This link is being provided for informational/ educational purposes only.) .    LKM1 Ab 07/05/2021 <=20.0  <=20.0 U Final   Comment: . Reference Range:   <=20.0      Negative   20.1-24.9   Equivocal   >=25.0      Positive . Anti-liver/kidney microsomal antibodies (Anti-LKM-1) were previously tested by indirect immunofluorescence (IF) using rodent liver/kidney substrate. Identification of a specific antibody target as cytochrome P450 IID6 has led to the current recombinant based ELISA. Antibodies to this cytochrome are present in approximately 70% of patients with autoimmune hepatitis type 2. This antibody is also present in approximately 10% of patients with  hepatitis C infection. .Marland Kitchen   Actin (Smooth Muscle) Antibody (IG* 07/05/2021 <20  <20 U Final   Comment: . Reference Range:    <20 U: Negative >or=20 U: Positive . .Marland KitchenAntibodies recognizing actin are the main component of smooth muscle antibodies associated with auto- immune liver disease. Actin antibodies are found in approximately 75% of patients with autoimmune hepatitis (AIH) type 1, approximately 65% of patients with autoimmune cholangitis, approximately 30% of patients with primary biliary cirrhosis and approximately 2% of healthy controls. High values are closely correlated with AIH type 1. .    UKorearevealed: IMPRESSION: 1. 4 mm gallbladder polyp, likely benign. No additional follow-up is recommended. This  recommendation follows ACR consensus guidelines: White Paper of the ACR Incidental Findings Committee II on Gallbladder and Biliary Findings. J Am Coll Radiol 2013:;10:953-956. 2. Echogenic focus within the common bile duct lumen which may represent a small gallstone. MRCP is recommended to exclude the presence of a small intraluminal mass. 3. Hepatic steatosis.  I do not see where MRCP occurred. Patient has a history of alcoholic pancreatitis, rheumatoid arthritis, (I suspect chronic pancreatitis or pancreatic insufficiency) who presents today with weakness  Patient is in a wheelchair today.  She is unable to stand.  She is unable to flex her hip against gravity.  She cannot even lift her legs out of the wheelchair.  She has 2/5 strength in knee extension and knee flexion and 3/5 strength in ankle flexion and ankle extension.  She is unable to fully extend her neck.  Her head is drooped forward with her chin touching her chest.  She states that this has been present for the last week.  She has decreased grip strength.  She has 3/5 strength with elbow flexion and extension.  She has diffuse muscular weakness.  She is also hallucinating.  She is seeing spiders crawling all  over her body.  She reports numbness in her feet and in her hands.  I suspect Warnicke's encephalopathy due to thiamine deficiency.  Mini-Mental status exam today is 30 out of 30 with excellent recall.  Past Medical History:  Diagnosis Date   Abnormal Pap smear    Alcohol use    Allergic rhinitis    BV (bacterial vaginosis)    GERD (gastroesophageal reflux disease)    Hepatitis    LFT's >1000 (2019) due to methotrexate vs alcohol   High cholesterol    HSV-2 (herpes simplex virus 2) infection    Hypertension    Pancreatitis    12/19 (thought to be due to alcohol)   Rheumatoid arthritis (HCC)    Seizures (HCC)    dx as child with epilepsy but no seizures since 89 and on no meds   Vaginal Pap smear, abnormal    Past Surgical History:  Procedure Laterality Date   CERVICAL CONIZATION W/BX  12/20/2012   Procedure: CONIZATION CERVIX WITH BIOPSY;  Surgeon: Florian Buff, MD;  Location: [facility];  Service: Gynecology;  Laterality: N/A;   HOLMIUM LASER APPLICATION  7/49/4496   Procedure: HOLMIUM LASER APPLICATION;  Surgeon: Florian Buff, MD;  Location: [facility];  Service: Gynecology;  Laterality: N/A;   Current Outpatient Medications on File Prior to Visit  Medication Sig Dispense Refill   Biotin 1000 MCG tablet Take 1,000 mcg by mouth daily.     cycloSPORINE (RESTASIS) 0.05 % ophthalmic emulsion INSTILL 1 DROP INTO AFFECTED EYE EVERY 12 HOURS     escitalopram (LEXAPRO) 10 MG tablet TAKE 1 TABLET BY MOUTH EVERY DAY 90 tablet 4   fluticasone (FLONASE) 50 MCG/ACT nasal spray USE TWO SPRAY(S) IN EACH NOSTRIL ONCE DAILY (Patient taking differently: as needed.) 16 g 11   folic acid (FOLVITE) 1 MG tablet Take 1 mg by mouth daily.     HUMIRA PEN 40 MG/0.4ML PNKT Inject 40 mg into the skin every 14 (fourteen) days.      ibuprofen (ADVIL) 600 MG tablet Take 1 tablet (600 mg total) by mouth every 6 (six) hours as needed. 30 tablet 0   Icosapent Ethyl (VASCEPA) 1 g CAPS Take 2 capsules (2 g total) by  mouth 2 (two) times daily. 120 capsule 11   medroxyPROGESTERone (  DEPO-PROVERA) 150 MG/ML injection Inject 1 mL (150 mg total) into the muscle every 3 (three) months. INJECT 1 ML INTRAMUSCULARLY IN DOCTOR'S OFFICE EVERY 3 MONTHS. 1 mL 3   nebivolol (BYSTOLIC) 10 MG tablet Take 1 tablet (10 mg total) by mouth daily. 90 tablet 3   pantoprazole (PROTONIX) 40 MG tablet TAKE 1 TABLET BY MOUTH TWICE A DAY 60 tablet 0   potassium chloride SA (KLOR-CON M) 20 MEQ tablet TAKE ONE TABLET BY MOUTH ONCE DAILY. 30 tablet 0   predniSONE (STERAPRED UNI-PAK 48 TAB) 5 MG (48) TBPK tablet Take by mouth as directed.     spironolactone (ALDACTONE) 25 MG tablet Take 1 tablet (25 mg total) by mouth daily. 30 tablet 3   thiamine 100 MG tablet Take 100 mg by mouth 2 (two) times daily.     valACYclovir (VALTREX) 1000 MG tablet Take 1 tablet by mouth in the morning and at bedtime.     No current facility-administered medications on file prior to visit.   Allergies  Allergen Reactions   Penicillins Hives    Has patient had a PCN reaction causing immediate rash, facial/tongue/throat swelling, SOB or lightheadedness with hypotension: Unknown Has patient had a PCN reaction causing severe rash involving mucus membranes or skin necrosis: Unknown Has patient had a PCN reaction that required hospitalization: Unknown Has patient had a PCN reaction occurring within the last 10 years: No If all of the above answers are "NO", then may proceed with Cephalosporin use.    Social History   Socioeconomic History   Marital status: Married    Spouse name: Not on file   Number of children: 2   Years of education: Not on file   Highest education level: Not on file  Occupational History   Occupation: [employer]    Employer: [employer]   Tobacco Use   Smoking status: Every Day    Packs/day: 0.50    Years: 15.00    Pack years: 7.50    Types: Cigarettes   Smokeless tobacco: Never  Vaping Use   Vaping  Use: Never used  Substance and Sexual Activity   Alcohol use: Yes    Alcohol/week: 2.0 standard drinks    Types: 2 Standard drinks or equivalent per week    Comment: about 2 drinks per day   Drug use: No   Sexual activity: Yes    Birth control/protection: Injection    Comment: Depo Provera every 3 months  Other Topics Concern   Not on file  Social History Narrative   Not on file   Social Determinants of Health   Financial Resource Strain: Not on file  Food Insecurity: Not on file  Transportation Needs: Not on file  Physical Activity: Not on file  Stress: Not on file  Social Connections: Not on file  Intimate Partner Violence: Not on file   Family History  Problem Relation Age of Onset   Hypertension Mother    Hypertension Father    Pancreatic cancer Paternal Uncle    Colon cancer Neg Hx    Esophageal cancer Neg Hx    Inflammatory bowel disease Neg Hx    Liver disease Neg Hx    Rectal cancer Neg Hx    Stomach cancer Neg Hx       Review of Systems     Objective:   Physical Exam Constitutional:      General: She is not in acute distress.    Appearance: She is underweight. She is  ill-appearing. She is not toxic-appearing or diaphoretic.  Cardiovascular:     Rate and Rhythm: Regular rhythm. Tachycardia present.     Heart sounds: Normal heart sounds. No murmur heard.   No gallop.  Pulmonary:     Effort: Pulmonary effort is normal. No respiratory distress.     Breath sounds: Normal breath sounds. No stridor. No wheezing, rhonchi or rales.  Abdominal:     General: Bowel sounds are normal. There is no distension.     Palpations: Abdomen is soft.     Tenderness: There is no abdominal tenderness. There is no guarding.  Musculoskeletal:     Cervical back: Neck supple. No tenderness.  Skin:    Findings: Rash present.  Neurological:     Mental Status: She is alert. She is disoriented.     Cranial Nerves: Cranial nerve deficit present.     Sensory: Sensory deficit  present.     Motor: Weakness present.     Coordination: Coordination abnormal.     Gait: Gait abnormal.  Psychiatric:        Attention and Perception: Attention normal. She perceives visual hallucinations.        Mood and Affect: Mood normal.        Speech: Speech normal.          Assessment & Plan:  Neuropathy - Plan: CBC with Differential/Platelet, COMPLETE METABOLIC PANEL WITH GFR, Vitamin B12, TSH, Hemoglobin A1c  Muscle weakness  Encephalopathy Patient is demonstrating visual hallucinations seeing spiders crawling on her that are not present.  She also has diffuse muscle weakness and the inability to move and control her neck muscles.  She is unable to walk and stand.  Differential diagnosis includes Warnicke's encephalopathy.  Autoimmune encephalopathy.  Acute myasthenia crisis.  Patient needs to go immediately to the emergency room.  I would recommend B12, thiamine levels, folic acid levels, and MRI of the brain, and neurology consultation.  Patient has a longstanding history of alcohol abuse.  I suspect vitamin deficiencies of the most likely cause.  Patient appears cachectic.

## 2022-02-09 NOTE — ED Triage Notes (Signed)
Sent from PCP for an abnormal neuro exam, PCP concerned for Wernicke's encephalopathy. Pt is chornic alcohol user, drinks 3-4 beverages per day. Husband reports hallucinations, spiders and people who are not present. Reports neck weakness and unable to hold up head. Progressive since Monday.  ?

## 2022-02-09 NOTE — H&P (Addendum)
History and Physical   Samantha Rosales ZOX:[id number] DOB: 01/01/1981 DOA: 02/09/2022  PCP: Donita BrooksPickard, Warren T, MD   Patient coming from: PCP office  Chief Complaint: Weakness  HPI: Samantha HumphreyLamisha M Rosales is a 41 y.o. female with medical history significant of idiopathic urticaria, rheumatoid arthritis, pancreatitis, hypothyroidism, hypomagnesemia, hypokalemia, hyperlipidemia, hypertension, alcohol use with history of withdrawal, GERD, seizure history last seizure 1999 not on medications presenting with ongoing weakness and some altered mental status.  Patient has had diffuse weakness with inability to walk and difficulty controlling her head today.  Also reports some hallucination.  And was reportedly encephalopathic earlier though has improved in the ED.  Saw her PCP earlier today and was sent to the ED for evaluation.  States that she is prescribed potassium but has been taking it due to the pill being large and difficult to swallow.  She also has not been taking Vascepa due to difficulty swallowing.  Denies fevers, chills, chest pain, shortness of breath, abdominal pain, constipation, diarrhea, nausea, vomiting.  ED Course: Vital signs in the ED significant for heart rate in the 100s to 110s initially improved to the 80s to 90s, blood pressure in the 120s to 1 telemetry systolic, respiratory rate in the teens to 20s.  Lab work-up included CMP which showed sodium 137, potassium initially less than 2.0 and then less than 2.0 again on repeat, chloride 86, normal bicarb but gap of 24 which improved to 19 on repeat.  Glucose 129, calcium 10.7, protein 8.7, AST 168 which is similar to previous, T. bili 3.1 which is new.  Review of Systems: As per HPI otherwise all other systems reviewed and are negative.  Past Medical History:  Diagnosis Date   Abnormal Pap smear    Alcohol use    Allergic rhinitis    BV (bacterial vaginosis)    GERD (gastroesophageal reflux disease)    Hepatitis    LFT's >1000  (2019) due to methotrexate vs alcohol   High cholesterol    HSV-2 (herpes simplex virus 2) infection    Hypertension    Hypothyroidism 10/17/2019   Pancreatitis    12/19 (thought to be due to alcohol)   Rheumatoid arthritis (HCC)    Seizures (HCC)    dx as child with epilepsy but no seizures since 89 and on no meds   Vaginal Pap smear, abnormal     Past Surgical History:  Procedure Laterality Date   CERVICAL CONIZATION W/BX  12/20/2012   Procedure: CONIZATION CERVIX WITH BIOPSY;  Surgeon: Lazaro ArmsLuther H Eure, MD;  Location: [facility];  Service: Gynecology;  Laterality: N/A;   HOLMIUM LASER APPLICATION  12/20/2012   Procedure: HOLMIUM LASER APPLICATION;  Surgeon: Lazaro ArmsLuther H Eure, MD;  Location: [facility];  Service: Gynecology;  Laterality: N/A;    Social History  reports that she has been smoking cigarettes. She has a 7.50 pack-year smoking history. She has never used smokeless tobacco. She reports current alcohol use of about 2.0 standard drinks per week. She reports that she does not use drugs.  Allergies  Allergen Reactions   Penicillins Hives    Has patient had a PCN reaction causing immediate rash, facial/tongue/throat swelling, SOB or lightheadedness with hypotension: Unknown Has patient had a PCN reaction causing severe rash involving mucus membranes or skin necrosis: Unknown Has patient had a PCN reaction that required hospitalization: Unknown Has patient had a PCN reaction occurring within the last 10 years: No If all of the above answers are "NO", then may proceed  with Cephalosporin use.     Family History  Problem Relation Age of Onset   Hypertension Mother    Hypertension Father    Pancreatic cancer Paternal Uncle    Colon cancer Neg Hx    Esophageal cancer Neg Hx    Inflammatory bowel disease Neg Hx    Liver disease Neg Hx    Rectal cancer Neg Hx    Stomach cancer Neg Hx   Reviewed on admission  Prior to Admission medications   Medication Sig Start Date End Date Taking?  Authorizing Provider  Biotin 1000 MCG tablet Take 1,000 mcg by mouth daily.    [provider]  cycloSPORINE (RESTASIS) 0.05 % ophthalmic emulsion INSTILL 1 DROP INTO AFFECTED EYE EVERY 12 HOURS 12/31/20   [provider]  escitalopram (LEXAPRO) 10 MG tablet TAKE 1 TABLET BY MOUTH EVERY DAY 01/17/21   [place], Velna Hatchet, MD  fluticasone (FLONASE) 50 MCG/ACT nasal spray USE TWO SPRAY(S) IN EACH NOSTRIL ONCE DAILY Patient taking differently: as needed. 06/21/17   Donita Brooks, MD  folic acid (FOLVITE) 1 MG tablet Take 1 mg by mouth daily.    [provider]  HUMIRA PEN 40 MG/0.4ML PNKT Inject 40 mg into the skin every 14 (fourteen) days.  05/28/20   [provider]  ibuprofen (ADVIL) 600 MG tablet Take 1 tablet (600 mg total) by mouth every 6 (six) hours as needed. 05/03/21   Moshe Cipro, NP  Icosapent Ethyl (VASCEPA) 1 g CAPS Take 2 capsules (2 g total) by mouth 2 (two) times daily. 09/05/18   Donita Brooks, MD  medroxyPROGESTERone (DEPO-PROVERA) 150 MG/ML injection Inject 1 mL (150 mg total) into the muscle every 3 (three) months. INJECT 1 ML INTRAMUSCULARLY IN DOCTOR'S OFFICE EVERY 3 MONTHS. 04/19/21   Adline Potter, NP  nebivolol (BYSTOLIC) 10 MG tablet Take 1 tablet (10 mg total) by mouth daily. 04/05/21   Donita Brooks, MD  pantoprazole (PROTONIX) 40 MG tablet TAKE 1 TABLET BY MOUTH TWICE A DAY 12/31/19   Mansouraty, Netty Starring., MD  potassium chloride SA (KLOR-CON M) 20 MEQ tablet TAKE ONE TABLET BY MOUTH ONCE DAILY. 11/29/21   Donita Brooks, MD  spironolactone (ALDACTONE) 25 MG tablet Take 1 tablet (25 mg total) by mouth daily. 04/05/21   Donita Brooks, MD  thiamine 100 MG tablet Take 100 mg by mouth 2 (two) times daily. 04/18/21   [provider]  valACYclovir (VALTREX) 1000 MG tablet Take 1 tablet by mouth in the morning and at bedtime.    [provider]    Physical Exam: Vitals:   02/09/22 1630 02/09/22 1700  02/09/22 1730 02/09/22 1815  BP: 135/86 (!) 154/105 (!) 149/98 (!) 140/95  Pulse: 93 95 88 89  Resp: (!) 21 20 (!) 22 19  Temp:      TempSrc:      SpO2: 100% 99% 100% 99%  Weight:      Height:        Physical Exam Constitutional:      General: She is not in acute distress.    Appearance: Normal appearance.  HENT:     Head: Normocephalic and atraumatic.     Mouth/Throat:     Mouth: Mucous membranes are moist.     Pharynx: Oropharynx is clear.  Eyes:     Extraocular Movements: Extraocular movements intact.     Pupils: Pupils are equal, round, and reactive to light.  Cardiovascular:     Rate  and Rhythm: Normal rate and regular rhythm.     Pulses: Normal pulses.     Heart sounds: Normal heart sounds.  Pulmonary:     Effort: Pulmonary effort is normal. No respiratory distress.     Breath sounds: Normal breath sounds.  Abdominal:     General: Bowel sounds are normal. There is no distension.     Palpations: Abdomen is soft.     Tenderness: There is no abdominal tenderness.  Musculoskeletal:        General: No swelling or deformity.  Skin:    General: Skin is warm and dry.  Neurological:     General: No focal deficit present.     Motor: Weakness (Generalized) present.    Labs on Admission: I have personally reviewed following labs and imaging studies  CBC: Recent Labs  Lab 02/09/22 1132  WBC 6.4  NEUTROABS 5.2  HGB 11.8*  HCT 33.3*  MCV 95.7  PLT 105*    Basic Metabolic Panel: Recent Labs  Lab 02/09/22 1132 02/09/22 1707  NA 137 139  K <2.0* <2.0*  CL 86* 94*  CO2 27 26  GLUCOSE 129* 106*  BUN 15 15  CREATININE 0.79 0.68  CALCIUM 7.7* 7.1*  MG 1.2*  --     GFR: Estimated Creatinine Clearance: 84.7 mL/min (by C-G formula based on SCr of 0.68 mg/dL).  Liver Function Tests: Recent Labs  Lab 02/09/22 1132  AST 168*  ALT 44  ALKPHOS 100  BILITOT 3.1*  PROT 8.7*  ALBUMIN 4.1    Urine analysis:    Component Value Date/Time   COLORURINE  YELLOW 02/09/2022 1111   APPEARANCEUR CLEAR 02/09/2022 1111   LABSPEC 1.009 02/09/2022 1111   PHURINE 7.0 02/09/2022 1111   GLUCOSEU NEGATIVE 02/09/2022 1111   HGBUR MODERATE (A) 02/09/2022 1111   BILIRUBINUR NEGATIVE 02/09/2022 1111   KETONESUR 20 (A) 02/09/2022 1111   PROTEINUR NEGATIVE 02/09/2022 1111   UROBILINOGEN 0.2 12/13/2012 1517   NITRITE NEGATIVE 02/09/2022 1111   LEUKOCYTESUR NEGATIVE 02/09/2022 1111    Radiological Exams on Admission: CT Head Wo Contrast  Result Date: 02/09/2022 CLINICAL DATA:  Mental status change, unknown cause EXAM: CT HEAD WITHOUT CONTRAST TECHNIQUE: Contiguous axial images were obtained from the base of the skull through the vertex without intravenous contrast. RADIATION DOSE REDUCTION: This exam was performed according to the departmental dose-optimization program which includes automated exposure control, adjustment of the mA and/or kV according to patient size and/or use of iterative reconstruction technique. COMPARISON:  06/10/2020 FINDINGS: Brain: No evidence of acute infarction, hemorrhage, hydrocephalus, extra-axial collection or mass lesion/mass effect. Cavum septum pellucidum. Vascular: No hyperdense vessel or unexpected calcification. Skull: Normal. Negative for fracture or focal lesion. Sinuses/Orbits: No acute finding. Other: None. IMPRESSION: No acute intracranial abnormality. Electronically Signed   By: Duanne Guess D.O.   On: 02/09/2022 11:58   MR BRAIN W WO CONTRAST  Result Date: 02/09/2022 CLINICAL DATA:  Mental status change, unknown cause. Ataxia. Neck weakness. Hallucinations. History of alcohol abuse with concern for Wernicke encephalopathy. EXAM: MRI HEAD WITHOUT AND WITH CONTRAST TECHNIQUE: Multiplanar, multiecho pulse sequences of the brain and surrounding structures were obtained without and with intravenous contrast. CONTRAST:  6mL GADAVIST GADOBUTROL 1 MMOL/ML IV SOLN COMPARISON:  Head CT 02/09/2022 FINDINGS: Brain: There is no  evidence of an acute infarct, intracranial hemorrhage, mass, midline shift, or extra-axial fluid collection. Several small T2 hyperintensities are present in the subcortical white matter of the frontal lobes, and there is also minimal  T2 hyperintensity in the pons. No abnormal enhancement is identified. A cavum septum pellucidum et vergae is noted. There is borderline age advanced cerebellar atrophy. Supratentorial brain volume is within normal limits. No abnormal signal is seen in the periaqueductal gray matter, mamillary bodies, or thalami to indicate Wernicke encephalopathy. Vascular: Major intracranial vascular flow voids are preserved. Skull and upper cervical spine: Unremarkable bone marrow signal. Sinuses/Orbits: Unremarkable orbits. Paranasal sinuses and mastoid air cells are clear. Other: None. IMPRESSION: 1. No acute intracranial abnormality. 2. Minimal T2 signal changes in the white matter, nonspecific though may reflect early chronic small vessel ischemic disease, migraines, or prior infection/inflammation. Electronically Signed   By: Sebastian Ache M.D.   On: 02/09/2022 15:31   MR Cervical Spine W or Wo Contrast  Result Date: 02/09/2022 CLINICAL DATA:  Ataxia. Neck weakness. Hallucinations. History of alcohol abuse with concern for Wernicke encephalopathy. EXAM: MRI CERVICAL SPINE WITHOUT AND WITH CONTRAST TECHNIQUE: Multiplanar and multiecho pulse sequences of the cervical spine, to include the craniocervical junction and cervicothoracic junction, were obtained without and with intravenous contrast. CONTRAST:  57mL GADAVIST GADOBUTROL 1 MMOL/ML IV SOLN COMPARISON:  None. FINDINGS: Alignment: Straightening of the normal cervical lordosis. No listhesis. Vertebrae: No fracture, suspicious marrow lesion, or significant marrow edema. Hemangioma involving the posterior aspect of the T2 vertebral body and pedicle on the right. Cord: Normal cord signal and morphology. No abnormal intradural enhancement.  Posterior Fossa, vertebral arteries, paraspinal tissues: Posterior fossa more fully evaluated on today's separate head MRI. Preserved vertebral artery flow voids. Disc levels: Preserved disc space heights. Small central disc protrusion at C3-4 and minimal disc bulging at C4-5 and C5-6 without stenosis. IMPRESSION: 1. Normal appearance of the cervical spinal cord. 2. Mild cervical spondylosis without stenosis. Electronically Signed   By: Sebastian Ache M.D.   On: 02/09/2022 15:22   DG Chest Portable 1 View  Result Date: 02/09/2022 CLINICAL DATA:  Altered mental status EXAM: PORTABLE CHEST 1 VIEW COMPARISON:  Chest radiograph 10/16/2019 FINDINGS: The cardiomediastinal silhouette is normal. There is no focal consolidation or pulmonary edema. There is no pleural effusion or pneumothorax. There is no acute osseous abnormality. IMPRESSION: No radiographic evidence of acute cardiopulmonary process. Electronically Signed   By: Lesia Hausen M.D.   On: 02/09/2022 12:51    EKG: Independently reviewed.  Sinus rhythm at 95 bpm.  Baseline artifact.  QTc significantly prolonged at 613.  Assessment/Plan Principal Problem:   Hypokalemia Active Problems:   Elevated liver enzymes   Essential hypertension   High triglycerides   Hypomagnesemia   Alcohol abuse   Rheumatoid arthritis involving multiple sites with positive rheumatoid factor (HCC)   Weakness Hypokalemia Hypomagnesemia Prolonged QTc > Patient presenting with diffuse weakness including inability to walk and difficulty controlling her head.  Initially presented with encephalopathy but this appears to clear the ED (neurology was consulted and patient had normal CT head, MRI brain, MRI C-spine, and neurology has signed off as well as patient's encephalopathy improving). > Noted to have significant hypokalemia at less than 2 and so far repeat lab remained less than 2 and magnesium low at 1.2.  Has history of hypokalemia and hypomagnesemia in the chart. >  Unclear etiology at this time.  No obvious medication effect is on spironolactone which should help improve potassium.  Baseline potassium per chart review appears to be around 3. > Has not been taking her prescribed potassium pills due to them being too big it difficult to swallow. > Has received 40 mEq  p.o. and 40 mEq IV potassium in the ED.  Despite initial intervention potassium lab remained less than 2. > EKG with prolonged QTc of 613. - Monitor on progressive unit with telemetry for frequent labs and cardiac monitoring - Trend BMP every 3 hours - Add additional 20 mEq IV for total of 60 mEq IV in addition to 40 mEq p.o. - Recheck magnesium - Recheck EKG in the morning. - Hold QTc prolonging Lexapro  RA - On Humira  Hyperlipidemia - Is prescribed Vascepa but not currently taking this  Hypertension - Continue home spironolactone  Elevated liver enzymes History of alcohol use and history of withdrawal > States she drinks 3-4 drinks a day currently and has stopped for several days in the past month without withdrawal symptoms. > AST 168 and T. bili 3.1. - CIWA without Ativan for now - Folate and thiamine - Trend hepatic function  GERD - Continue home PPI  DVT prophylaxis: Lovenox Code Status:   Full Family Communication:  Significant other updated at bedside Disposition Plan:   Patient is from:  Home  Anticipated DC to:  Home  Anticipated DC date:  1 to 3 days  Anticipated DC barriers: None  Consults called:  Neurology, consulted in the ED, but have signed off. Admission status:  Observation, progressive  Severity of Illness: The appropriate patient status for this patient is OBSERVATION. Observation status is judged to be reasonable and necessary in order to provide the required intensity of service to ensure the patient's safety. The patient's presenting symptoms, physical exam findings, and initial radiographic and laboratory data in the context of their medical  condition is felt to place them at decreased risk for further clinical deterioration. Furthermore, it is anticipated that the patient will be medically stable for discharge from the hospital within 2 midnights of admission.    Synetta Fail MD [facility]  How to contact the [facility] Attending or Consulting provider 7A - 7P or covering provider during after hours 7P -7A, for this patient?   Check the care team in [facility] and look for a) attending/consulting [facility] provider listed and b) the [facility] team listed Log into [email or web address] and use [facility] universal password to access. If you do not have the password, please contact the hospital operator. Locate the [facility] provider you are looking for under [facility] and page to a number that you can be directly reached. If you still have difficulty reaching the provider, please page the [facility] (Director on Call) for the Hospitalists listed on amion for assistance.  02/09/2022, 7:39 PM

## 2022-02-09 NOTE — ED Notes (Signed)
Hospitalist at the bedside. Will do swallow screen to see if patient can tolerate PO fluids when he is done assessing patient. ?

## 2022-02-09 NOTE — ED Notes (Signed)
Patient transported to MRI 

## 2022-02-09 NOTE — ED Provider Notes (Signed)
[facility]-EMERGENCY DEPT Provider Note   CSN: [id number] Arrival date & time: 02/09/22  1025     History  No chief complaint on file.   Samantha Rosales is a 41 y.o. female.  Pt presents to the ED today with AMS.  Pt is a 41 yo female with a pmhx significant for gerd, htn, seizures as a child, etoh abuse, hepatitis and pancreatitis secondary to etoh abuse.  She said she has diffuse muscle weakness and the inability to control her head.  She has had hallucinations.  She is unable to walk.  She is unable to feed or dress herself.  She initially went to her pcp and was sent to the ED for further eval.  No fevers.      Home Medications Prior to Admission medications   Medication Sig Start Date End Date Taking? Authorizing Provider  Biotin 1000 MCG tablet Take 1,000 mcg by mouth daily.    [provider]  cycloSPORINE (RESTASIS) 0.05 % ophthalmic emulsion INSTILL 1 DROP INTO AFFECTED EYE EVERY 12 HOURS 12/31/20   [provider]  escitalopram (LEXAPRO) 10 MG tablet TAKE 1 TABLET BY MOUTH EVERY DAY 01/17/21   Mosby, Velna HatchetKawanta F, MD  fluticasone (FLONASE) 50 MCG/ACT nasal spray USE TWO SPRAY(S) IN EACH NOSTRIL ONCE DAILY Patient taking differently: as needed. 06/21/17   Donita BrooksPickard, Warren T, MD  folic acid (FOLVITE) 1 MG tablet Take 1 mg by mouth daily.    [provider]  HUMIRA PEN 40 MG/0.4ML PNKT Inject 40 mg into the skin every 14 (fourteen) days.  05/28/20   [provider]  ibuprofen (ADVIL) 600 MG tablet Take 1 tablet (600 mg total) by mouth every 6 (six) hours as needed. 05/03/21   Moshe CiproMatthews, Stephanie, NP  Icosapent Ethyl (VASCEPA) 1 g CAPS Take 2 capsules (2 g total) by mouth 2 (two) times daily. 09/05/18   Donita BrooksPickard, Warren T, MD  medroxyPROGESTERone (DEPO-PROVERA) 150 MG/ML injection Inject 1 mL (150 mg total) into the muscle every 3 (three) months. INJECT 1 ML INTRAMUSCULARLY IN DOCTOR'S OFFICE EVERY 3 MONTHS. 04/19/21   Adline PotterGriffin,  Jennifer A, NP  nebivolol (BYSTOLIC) 10 MG tablet Take 1 tablet (10 mg total) by mouth daily. 04/05/21   Donita BrooksPickard, Warren T, MD  pantoprazole (PROTONIX) 40 MG tablet TAKE 1 TABLET BY MOUTH TWICE A DAY 12/31/19   Mansouraty, Netty StarringGabriel Jr., MD  potassium chloride SA (KLOR-CON M) 20 MEQ tablet TAKE ONE TABLET BY MOUTH ONCE DAILY. 11/29/21   Donita BrooksPickard, Warren T, MD  spironolactone (ALDACTONE) 25 MG tablet Take 1 tablet (25 mg total) by mouth daily. 04/05/21   Donita BrooksPickard, Warren T, MD  thiamine 100 MG tablet Take 100 mg by mouth 2 (two) times daily. 04/18/21   [provider]  valACYclovir (VALTREX) 1000 MG tablet Take 1 tablet by mouth in the morning and at bedtime.    [provider]      Allergies    Penicillins    Review of Systems   Review of Systems  Neurological:  Positive for weakness.  All other systems reviewed and are negative.  Physical Exam Updated Vital Signs BP (!) 171/115    Pulse 91    Temp 98.7 F (37.1 C) (Oral)    Resp (!) 23    Ht 5\' 8"  (1.727 m)    Wt 58 kg    SpO2 100%    BMI 19.44 kg/m  Physical Exam Vitals and nursing note reviewed.  HENT:  Head: Normocephalic and atraumatic.     Right Ear: External ear normal.     Left Ear: External ear normal.     Nose: Nose normal.     Mouth/Throat:     Mouth: Mucous membranes are moist.     Pharynx: Oropharynx is clear.  Eyes:     Extraocular Movements: Extraocular movements intact.     Conjunctiva/sclera: Conjunctivae normal.     Pupils: Pupils are equal, round, and reactive to light.  Cardiovascular:     Rate and Rhythm: Normal rate and regular rhythm.     Pulses: Normal pulses.     Heart sounds: Normal heart sounds.  Pulmonary:     Effort: Pulmonary effort is normal.     Breath sounds: Normal breath sounds.  Abdominal:     General: Abdomen is flat. Bowel sounds are normal.     Palpations: Abdomen is soft.  Musculoskeletal:        General: Normal range of motion.     Cervical back: Normal range of  motion and neck supple.  Skin:    General: Skin is warm.     Capillary Refill: Capillary refill takes less than 2 seconds.  Neurological:     Mental Status: She is alert and oriented to person, place, and time.     Cranial Nerves: Dysarthria present.     Comments: Distal and proximal motor strength weakness  Psychiatric:     Comments: No current hallucinations    ED Results / Procedures / Treatments   Labs (all labs ordered are listed, but only abnormal results are displayed) Labs Reviewed  CBC WITH DIFFERENTIAL/PLATELET - Abnormal; Notable for the following components:      Result Value   RBC 3.48 (*)    Hemoglobin 11.8 (*)    HCT 33.3 (*)    Platelets 105 (*)    Lymphs Abs 0.5 (*)    All other components within normal limits  COMPREHENSIVE METABOLIC PANEL - Abnormal; Notable for the following components:   Potassium <2.0 (*)    Chloride 86 (*)    Glucose, Bld 129 (*)    Calcium 7.7 (*)    Total Protein 8.7 (*)    AST 168 (*)    Total Bilirubin 3.1 (*)    Anion gap 24 (*)    All other components within normal limits  URINALYSIS, ROUTINE W REFLEX MICROSCOPIC - Abnormal; Notable for the following components:   Hgb urine dipstick MODERATE (*)    Ketones, ur 20 (*)    Bacteria, UA RARE (*)    All other components within normal limits  HEMOGLOBIN A1C - Abnormal; Notable for the following components:   Hgb A1c MFr Bld 4.6 (*)    All other components within normal limits  MAGNESIUM - Abnormal; Notable for the following components:   Magnesium 1.2 (*)    All other components within normal limits  CBG MONITORING, ED - Abnormal; Notable for the following components:   Glucose-Capillary 133 (*)    All other components within normal limits  RESP PANEL BY RT-PCR (FLU A&B, COVID) ARPGX2  AMMONIA  TSH  VITAMIN B12  ETHANOL  RAPID URINE DRUG SCREEN, HOSP PERFORMED  BASIC METABOLIC PANEL    EKG EKG Interpretation  Date/Time:  Thursday February 09 2022 11:24:36  EST Ventricular Rate:  95 PR Interval:  123 QRS Duration: 90 QT Interval:  487 QTC Calculation: 613 R Axis:   42 Text Interpretation: Sinus rhythm Probable LVH with secondary repol abnrm  Prolonged QT interval Artifact in lead(s) I III aVR aVL No significant change since last tracing Confirmed by Jacalyn Lefevre [phone]) on 02/09/2022 12:02:17 PM  Radiology CT Head Wo Contrast  Result Date: 02/09/2022 CLINICAL DATA:  Mental status change, unknown cause EXAM: CT HEAD WITHOUT CONTRAST TECHNIQUE: Contiguous axial images were obtained from the base of the skull through the vertex without intravenous contrast. RADIATION DOSE REDUCTION: This exam was performed according to the departmental dose-optimization program which includes automated exposure control, adjustment of the mA and/or kV according to patient size and/or use of iterative reconstruction technique. COMPARISON:  06/10/2020 FINDINGS: Brain: No evidence of acute infarction, hemorrhage, hydrocephalus, extra-axial collection or mass lesion/mass effect. Cavum septum pellucidum. Vascular: No hyperdense vessel or unexpected calcification. Skull: Normal. Negative for fracture or focal lesion. Sinuses/Orbits: No acute finding. Other: None. IMPRESSION: No acute intracranial abnormality. Electronically Signed   By: Duanne Guess D.O.   On: 02/09/2022 11:58   DG Chest Portable 1 View  Result Date: 02/09/2022 CLINICAL DATA:  Altered mental status EXAM: PORTABLE CHEST 1 VIEW COMPARISON:  Chest radiograph 10/16/2019 FINDINGS: The cardiomediastinal silhouette is normal. There is no focal consolidation or pulmonary edema. There is no pleural effusion or pneumothorax. There is no acute osseous abnormality. IMPRESSION: No radiographic evidence of acute cardiopulmonary process. Electronically Signed   By: Lesia Hausen M.D.   On: 02/09/2022 12:51    Procedures Procedures    Medications Ordered in ED Medications  potassium chloride 10 mEq in 100 mL IVPB (0 mEq  Intravenous Stopped 02/09/22 1410)  potassium chloride SA (KLOR-CON M) CR tablet 40 mEq (40 mEq Oral Not Given 02/09/22 1306)  magnesium sulfate IVPB 2 g 50 mL (has no administration in time range)  LORazepam (ATIVAN) injection 1 mg (1 mg Intravenous Given 02/09/22 1418)  sodium chloride 0.9 % bolus 1,000 mL (0 mLs Intravenous Stopped 02/09/22 1420)  gadobutrol (GADAVIST) 1 MMOL/ML injection 6 mL (6 mLs Intravenous Contrast Given 02/09/22 1501)    ED Course/ Medical Decision Making/ A&P                           Medical Decision Making Amount and/or Complexity of Data Reviewed Labs: ordered. Radiology: ordered.  Risk Prescription drug management.   This patient presents to the ED for concern of ams, this involves an extensive number of treatment options, and is a complaint that carries with it a high risk of complications and morbidity.  The differential diagnosis includes electrolyte abn, cva, ms, vitamin deficinecy   Co morbidities that complicate the patient evaluation  gerd, htn, seizures as a child, etoh abuse, hepatitis and pancreatitis secondary to etoh abuse   Additional history obtained:  Additional history obtained from epic chart review External records from outside source obtained and reviewed including husband   Lab Tests:  I Ordered, and personally interpreted labs.  The pertinent results include:  cbc with thrombocytopenia and mild anemia.  Covid/flu neg.  Very low K and low Mg.  B12 nl.  EtoH neg.   Imaging Studies ordered:  I ordered imaging studies including ct head, mri head/c-spine  I independently visualized and interpreted imaging which showed ct head:  IMPRESSION:  No acute intracranial abnormality.   I agree with the radiologist interpretation   Cardiac Monitoring:  The patient was maintained on a cardiac monitor.  I personally viewed and interpreted the cardiac monitored which showed an underlying rhythm of: nsr   Medicines ordered and prescription  drug management:  I ordered medication including kcl and mg  for hypokalemia and hypomagnesemia  Reevaluation of the patient after these medicines showed that the patient stayed the same I have reviewed the patients home medicines and have made adjustments as needed   Test Considered:  MRI   Critical Interventions:  K and Mg   Consultations Obtained:  I requested consultation with the neurologist (Dr. Wilford Corner),  and discussed lab and imaging findings as well as pertinent plan - they recommend: MRI brain and cervical spine with and without contrast.  Call him after results.   Problem List / ED Course:  Severe weakness:  low K and Mg Hypokalemia:  replaced Hypomagnesemia: replaced Repeat Bmp ordered   Reevaluation:  After the interventions noted above, I reevaluated the patient and found that they have :stayed the same   Social Determinants of Health:  Lives at home with husband.  Alcohol abuse   Dispostion:  After consideration of the diagnostic results and the patients response to treatment, I feel that the patent would benefit from admission.    Pt signed out to Dr. Effie Shy at shift change.  CRITICAL CARE Performed by: Jacalyn Lefevre   Total critical care time: 30 minutes  Critical care time was exclusive of separately billable procedures and treating other patients.  Critical care was necessary to treat or prevent imminent or life-threatening deterioration.  Critical care was time spent personally by me on the following activities: development of treatment plan with patient and/or surrogate as well as nursing, discussions with consultants, evaluation of patient's response to treatment, examination of patient, obtaining history from patient or surrogate, ordering and performing treatments and interventions, ordering and review of laboratory studies, ordering and review of radiographic studies, pulse oximetry and re-evaluation of patient's condition.          Final Clinical Impression(s) / ED Diagnoses Final diagnoses:  Hypokalemia  Hypomagnesemia  Generalized weakness  Alcohol abuse    Rx / DC Orders ED Discharge Orders     None         Jacalyn Lefevre, MD 02/09/22 1510

## 2022-02-09 NOTE — ED Provider Notes (Signed)
3:30 PM-checkout from Dr. Particia NearingHaviland to evaluate patient after imaging and laboratory tests have returned.  She presents today with altered mental status.  She has diffuse muscle weakness and is having trouble controlling her head. ? ?6:15 PM-labs returned with profound hypokalemia likely source for weakness.  Additional abnormalities include chloride low, glucose high, calcium low, hemoglobin A1c low, magnesium low, total bilirubin high, anion gap high, remainder of labs are reassuring. ? ?Medications ordered to treat severe metabolic abnormality with cardiac compromise, prolonged QT.  IV bolus magnesium, IV potassium for runs ordered. ? ?MRI of brain and cervical spine, did not show any acute abnormalities. Likewise portable chest x-ray and CT head were normal. ? ?6:30 PM-patient asleep on stretcher, her partner is at the bedside, also sleep.  Patient was sleepy and required 30 seconds of stimulation to awaken her.  After that she was alert and conversant.  She did not appear to be significantly confused.  There was no dysarthria.  She understands that she needs to be admitted to the hospital. ? ?6:33 PM-Consult complete with hospitalist. Patient case explained and discussed.  He agrees to admit patient for further evaluation and treatment. Call ended at 7:10 PM ?  ?Samantha Rosales, Samantha Brunell, MD ?02/09/22 1911 ? ?

## 2022-02-10 DIAGNOSIS — R1310 Dysphagia, unspecified: Secondary | ICD-10-CM | POA: Diagnosis present

## 2022-02-10 DIAGNOSIS — K219 Gastro-esophageal reflux disease without esophagitis: Secondary | ICD-10-CM | POA: Diagnosis present

## 2022-02-10 DIAGNOSIS — E876 Hypokalemia: Secondary | ICD-10-CM | POA: Diagnosis present

## 2022-02-10 DIAGNOSIS — F1020 Alcohol dependence, uncomplicated: Secondary | ICD-10-CM | POA: Diagnosis present

## 2022-02-10 DIAGNOSIS — Z880 Allergy status to penicillin: Secondary | ICD-10-CM | POA: Diagnosis not present

## 2022-02-10 DIAGNOSIS — I10 Essential (primary) hypertension: Secondary | ICD-10-CM | POA: Diagnosis present

## 2022-02-10 DIAGNOSIS — F17210 Nicotine dependence, cigarettes, uncomplicated: Secondary | ICD-10-CM | POA: Diagnosis present

## 2022-02-10 DIAGNOSIS — Z8249 Family history of ischemic heart disease and other diseases of the circulatory system: Secondary | ICD-10-CM | POA: Diagnosis not present

## 2022-02-10 DIAGNOSIS — D638 Anemia in other chronic diseases classified elsewhere: Secondary | ICD-10-CM | POA: Diagnosis present

## 2022-02-10 DIAGNOSIS — Z79899 Other long term (current) drug therapy: Secondary | ICD-10-CM | POA: Diagnosis not present

## 2022-02-10 DIAGNOSIS — R748 Abnormal levels of other serum enzymes: Secondary | ICD-10-CM | POA: Diagnosis present

## 2022-02-10 DIAGNOSIS — D696 Thrombocytopenia, unspecified: Secondary | ICD-10-CM | POA: Diagnosis present

## 2022-02-10 DIAGNOSIS — E781 Pure hyperglyceridemia: Secondary | ICD-10-CM | POA: Diagnosis present

## 2022-02-10 DIAGNOSIS — F330 Major depressive disorder, recurrent, mild: Secondary | ICD-10-CM | POA: Diagnosis present

## 2022-02-10 DIAGNOSIS — M0579 Rheumatoid arthritis with rheumatoid factor of multiple sites without organ or systems involvement: Secondary | ICD-10-CM | POA: Diagnosis present

## 2022-02-10 DIAGNOSIS — E7800 Pure hypercholesterolemia, unspecified: Secondary | ICD-10-CM | POA: Diagnosis present

## 2022-02-10 DIAGNOSIS — Z793 Long term (current) use of hormonal contraceptives: Secondary | ICD-10-CM | POA: Diagnosis not present

## 2022-02-10 DIAGNOSIS — E039 Hypothyroidism, unspecified: Secondary | ICD-10-CM | POA: Diagnosis present

## 2022-02-10 DIAGNOSIS — R9431 Abnormal electrocardiogram [ECG] [EKG]: Secondary | ICD-10-CM | POA: Diagnosis present

## 2022-02-10 DIAGNOSIS — Z20822 Contact with and (suspected) exposure to covid-19: Secondary | ICD-10-CM | POA: Diagnosis present

## 2022-02-10 DIAGNOSIS — R443 Hallucinations, unspecified: Secondary | ICD-10-CM | POA: Diagnosis not present

## 2022-02-10 DIAGNOSIS — G9340 Encephalopathy, unspecified: Secondary | ICD-10-CM | POA: Diagnosis present

## 2022-02-10 DIAGNOSIS — R531 Weakness: Secondary | ICD-10-CM

## 2022-02-10 LAB — BASIC METABOLIC PANEL
Anion gap: 20 — ABNORMAL HIGH (ref 5–15)
Anion gap: 18 — ABNORMAL HIGH (ref 5–15)
BUN: 11 mg/dL (ref 6–20)
BUN: 9 mg/dL (ref 6–20)
CO2: 24 mmol/L (ref 22–32)
CO2: 25 mmol/L (ref 22–32)
Calcium: 6.9 mg/dL — ABNORMAL LOW (ref 8.9–10.3)
Calcium: 7.2 mg/dL — ABNORMAL LOW (ref 8.9–10.3)
Chloride: 96 mmol/L — ABNORMAL LOW (ref 98–111)
Chloride: 97 mmol/L — ABNORMAL LOW (ref 98–111)
Creatinine, Ser: 0.67 mg/dL (ref 0.44–1.00)
Creatinine, Ser: 0.56 mg/dL (ref 0.44–1.00)
GFR, Estimated: >60 mL/min (ref >60)
GFR, Estimated: >60 mL/min (ref >60)
Glucose, Bld: 95 mg/dL (ref 70–99)
Glucose, Bld: 114 mg/dL — ABNORMAL HIGH (ref 70–99)
Potassium: 2 mmol/L — CL (ref 3.5–5.1)
Potassium: 2 mmol/L — CL (ref 3.5–5.1)
Sodium: 140 mmol/L (ref 135–145)
Sodium: 140 mmol/L (ref 135–145)

## 2022-02-10 LAB — COMPREHENSIVE METABOLIC PANEL
ALT: 48 U/L — ABNORMAL HIGH (ref 0–44)
AST: 280 U/L — ABNORMAL HIGH (ref 15–41)
Albumin: 3.1 g/dL — ABNORMAL LOW (ref 3.5–5.0)
Alkaline Phosphatase: 113 U/L (ref 38–126)
Anion gap: 20 — ABNORMAL HIGH (ref 5–15)
BUN: 10 mg/dL (ref 6–20)
CO2: 24 mmol/L (ref 22–32)
Calcium: 7.1 mg/dL — ABNORMAL LOW (ref 8.9–10.3)
Chloride: 97 mmol/L — ABNORMAL LOW (ref 98–111)
Creatinine, Ser: 0.54 mg/dL (ref 0.44–1.00)
GFR, Estimated: >60 mL/min (ref >60)
Glucose, Bld: 94 mg/dL (ref 70–99)
Potassium: 2 mmol/L — CL (ref 3.5–5.1)
Sodium: 141 mmol/L (ref 135–145)
Total Bilirubin: 1.8 mg/dL — ABNORMAL HIGH (ref 0.3–1.2)
Total Protein: 6.7 g/dL (ref 6.5–8.1)

## 2022-02-10 LAB — CBC
HCT: 28.6 % — ABNORMAL LOW (ref 36.0–46.0)
Hemoglobin: 9.8 g/dL — ABNORMAL LOW (ref 12.0–15.0)
MCH: 33.1 pg (ref 26.0–34.0)
MCHC: 34.3 g/dL (ref 30.0–36.0)
MCV: 96.6 fL (ref 80.0–100.0)
Platelets: 91 10*3/uL — ABNORMAL LOW (ref 150–400)
RBC: 2.96 MIL/uL — ABNORMAL LOW (ref 3.87–5.11)
RDW: 13 % (ref 11.5–15.5)
WBC: 6.1 10*3/uL (ref 4.0–10.5)
nRBC: 0 % (ref 0.0–0.2)

## 2022-02-10 LAB — POTASSIUM: Potassium: 2.8 mmol/L — ABNORMAL LOW (ref 3.5–5.1)

## 2022-02-10 LAB — HIV ANTIBODY (ROUTINE TESTING W REFLEX): HIV Screen 4th Generation wRfx: NONREACTIVE

## 2022-02-10 LAB — PHOSPHORUS: Phosphorus: 2 mg/dL — ABNORMAL LOW (ref 2.5–4.6)

## 2022-02-10 LAB — MAGNESIUM: Magnesium: 1.5 mg/dL — ABNORMAL LOW (ref 1.7–2.4)

## 2022-02-10 MED ORDER — POTASSIUM CHLORIDE IN NACL 40-0.9 MEQ/L-% IV SOLN
INTRAVENOUS | Status: DC
  Filled 2022-02-10 (×6): qty 1000

## 2022-02-10 MED ORDER — LIP MEDEX EX OINT
TOPICAL_OINTMENT | CUTANEOUS | Status: DC | PRN
  Administered 2022-02-10: 75 via TOPICAL
  Filled 2022-02-10: qty 7

## 2022-02-10 MED ORDER — MAGNESIUM SULFATE 2 GM/50ML IV SOLN
2.0000 g | INTRAVENOUS | Status: AC
  Administered 2022-02-10: 2 g via INTRAVENOUS
  Filled 2022-02-10: qty 50

## 2022-02-10 MED ORDER — ADULT MULTIVITAMIN W/MINERALS CH
1.0000 | ORAL_TABLET | Freq: Every day | ORAL | Status: DC
  Administered 2022-02-10 – 2022-02-12 (×3): 1 via ORAL
  Filled 2022-02-10 (×3): qty 1

## 2022-02-10 MED ORDER — BOOST / RESOURCE BREEZE PO LIQD CUSTOM
1.0000 | Freq: Two times a day (BID) | ORAL | Status: DC
  Administered 2022-02-10 – 2022-02-12 (×4): 1 via ORAL

## 2022-02-10 MED ORDER — ENSURE ENLIVE PO LIQD
237.0000 mL | Freq: Two times a day (BID) | ORAL | Status: DC
  Administered 2022-02-10 – 2022-02-11 (×4): 237 mL via ORAL

## 2022-02-10 MED ORDER — ZINC SULFATE 220 (50 ZN) MG PO CAPS
220.0000 mg | ORAL_CAPSULE | Freq: Every day | ORAL | Status: DC
  Administered 2022-02-10 – 2022-02-12 (×3): 220 mg via ORAL
  Filled 2022-02-10 (×3): qty 1

## 2022-02-10 MED ORDER — ZINC GLUCONATE 50 MG PO TABS: 50.0000 mg | ORAL_TABLET | Freq: Every day | ORAL | Status: DC

## 2022-02-10 MED ORDER — POTASSIUM CHLORIDE IN NACL 40-0.9 MEQ/L-% IV SOLN
INTRAVENOUS | Status: DC
  Filled 2022-02-10: qty 1000

## 2022-02-10 MED ORDER — MAGNESIUM SULFATE IN D5W 1-5 GM/100ML-% IV SOLN
1.0000 g | Freq: Once | INTRAVENOUS | Status: AC
  Administered 2022-02-10: 1 g via INTRAVENOUS
  Filled 2022-02-10: qty 100

## 2022-02-10 MED ORDER — POTASSIUM CHLORIDE 10 MEQ/100ML IV SOLN
10.0000 meq | INTRAVENOUS | Status: AC
  Administered 2022-02-10 (×6): 10 meq via INTRAVENOUS
  Filled 2022-02-10 (×6): qty 100

## 2022-02-10 MED ORDER — POTASSIUM CHLORIDE 20 MEQ PO PACK
60.0000 meq | PACK | Freq: Once | ORAL | Status: AC
  Administered 2022-02-10: 60 meq via ORAL
  Filled 2022-02-10: qty 3

## 2022-02-10 MED ORDER — POTASSIUM PHOSPHATES 15 MMOLE/5ML IV SOLN
20.0000 mmol | Freq: Once | INTRAVENOUS | Status: AC
  Administered 2022-02-10: 20 mmol via INTRAVENOUS
  Filled 2022-02-10: qty 6.67

## 2022-02-10 MED ORDER — NEBIVOLOL HCL 10 MG PO TABS
10.0000 mg | ORAL_TABLET | Freq: Every day | ORAL | Status: DC
  Administered 2022-02-10 – 2022-02-12 (×3): 10 mg via ORAL
  Filled 2022-02-10 (×3): qty 1

## 2022-02-10 MED ORDER — MAGNESIUM SULFATE IN D5W 1-5 GM/100ML-% IV SOLN
1.0000 g | INTRAVENOUS | Status: DC
  Filled 2022-02-10: qty 100

## 2022-02-10 MED ORDER — POTASSIUM CHLORIDE 20 MEQ PO PACK
60.0000 meq | PACK | ORAL | Status: AC
  Administered 2022-02-10: 60 meq via ORAL
  Filled 2022-02-10: qty 3

## 2022-02-10 NOTE — Assessment & Plan Note (Signed)
On humira ?

## 2022-02-10 NOTE — Assessment & Plan Note (Addendum)
Seen by SLP. ?

## 2022-02-10 NOTE — Assessment & Plan Note (Addendum)
Elevated LFTs AST 162 2 bili 3.1-likely from alcohol induced. ?History of alcohol abuse and history of withdrawal: ?she reports her last drink was on Monday-reports only drinks beer and not liquor now.no signs of withdrawal, alcohol cessation was advised.  Seen by psych and cleared.   ?

## 2022-02-10 NOTE — Assessment & Plan Note (Addendum)
Hypomagnesemia ?Hypophosphatemia ?Hypocalcemia (corrected ca 7): ?Longstanding history of Hypokalemia mostly ranging 2.8-3.1 range. ?Electrolytes at this time have improved, continue oral supplement at home prescription sent.  Follow-up with PCP and repeat labs in 5 to 7 days.  ?Recent Labs  ?Lab 02/09/22 ?2139 02/10/22 ?[id number] 02/10/22 ?[id number] 02/10/22 ?0654 02/10/22 ?1911 02/11/22 ?0145 02/11/22 ?[id number] 02/11/22 ?1126 02/12/22 ?0350  ?K <2.0* 2.0* 2.0* 2.0* 2.8* 2.9*  --  3.8 4.4  ?CALCIUM 7.2* 6.9* 7.1* 7.2*  --  6.6*  --   --   --   ?MG 1.7  --  1.5*  --   --  1.3* 1.6*  --  1.5*  ?PHOS  --   --   --  2.0*  --  1.4* 2.4*  --  4.0  ? ?

## 2022-02-10 NOTE — Assessment & Plan Note (Addendum)
Continue home Bystolic.  Not on Aldactone.   ?

## 2022-02-10 NOTE — Assessment & Plan Note (Addendum)
See above

## 2022-02-10 NOTE — Progress Notes (Signed)
?   02/10/22 0500  ?Provider Notification  ?Provider Name/Title Blount  ?Date Provider Notified 02/10/22  ?Time Provider Notified [phone]  ?Notification Type Page  ?Notification Reason Critical result  ?Test performed and critical result K+ 2.0  ?Date Critical Result Received 02/10/22  ?Time Critical Result Received 0500  ? ? ?

## 2022-02-10 NOTE — Evaluation (Signed)
Clinical/Bedside Swallow Evaluation ?Patient Details  ?Name: Samantha Rosales ?MRN: [id number] ?Date of Birth: 07/21/1981 ? ?Today's Date: 02/10/2022 ?Time: SLP Start Time (ACUTE ONLY): 1118 SLP Stop Time (ACUTE ONLY): 1131 ?SLP Time Calculation (min) (ACUTE ONLY): 13 min ? ?Past Medical History:  ?Past Medical History:  ?Diagnosis Date  ? Abnormal Pap smear   ? Alcohol use   ? Allergic rhinitis   ? BV (bacterial vaginosis)   ? GERD (gastroesophageal reflux disease)   ? Hepatitis   ? LFT's >1000 (2019) due to methotrexate vs alcohol  ? High cholesterol   ? HSV-2 (herpes simplex virus 2) infection   ? Hypertension   ? Hypothyroidism 10/17/2019  ? Pancreatitis   ? 12/19 (thought to be due to alcohol)  ? Rheumatoid arthritis (HCC)   ? Seizures (HCC)   ? dx as child with epilepsy but no seizures since 5699 and on no meds  ? Vaginal Pap smear, abnormal   ? ?Past Surgical History:  ?Past Surgical History:  ?Procedure Laterality Date  ? CERVICAL CONIZATION W/BX  12/20/2012  ? Procedure: CONIZATION CERVIX WITH BIOPSY;  Surgeon: Lazaro ArmsLuther H Eure, MD;  Location: [facility];  Service: Gynecology;  Laterality: N/A;  ? HOLMIUM LASER APPLICATION  12/20/2012  ? Procedure: HOLMIUM LASER APPLICATION;  Surgeon: Lazaro ArmsLuther H Eure, MD;  Location: [facility];  Service: Gynecology;  Laterality: N/A;  ? ?HPI:  ?Pt is a 41 yo female presenting wtih weakness and AMS, admitted with hypokalemia. CXR and MRI Brain without acute changes. Pt reported difficulty swallowing her large potassium pill. PMH includes: GERD, RA, hypothyroidism, hypomagnesemia, hypokalemia, hyperlipidemia, hypertension, alcohol use with history of withdrawal, seizures (last in 1999)  ?  ?Assessment / Plan / Recommendation  ?Clinical Impression ? Pt's [facility]roph[facility] swallowing [facility] to [facility]. She has no overt s/s of aspiration. She says that she only has trouble swallowing her two big pills, as they make her gag and then she has to spit them back out. Would consider if these two medications  could be given crushed and/or in a different form, such as a liquid. Discussed with RN, who said that potassium has already been switched to a powder. No SLP f/u indicated at this point. ?SLP Visit Diagnosis: Dysphagia, unspecified (R13.10) ?   ?Aspiration Risk ? No limitations  ?  ?Diet Recommendation Regular;Thin liquid  ? ?Liquid Administration via: Cup;Straw ?Medication Administration: Whole meds with liquid (for smaller pills; try to give larger pills in alternative way) ?Supervision: Patient able to self feed ?Postural Changes: Seated upright at 90 degrees  ?  ?Other  Recommendations Oral Care Recommendations: Oral care BID   ? ?Recommendations for follow up therapy are one component of a multi-disciplinary discharge planning process, led by the attending physician.  Recommendations may be updated based on patient status, additional functional criteria and insurance authorization. ? ?Follow up Recommendations No SLP follow up  ? ? ?  ?Assistance Recommended at Discharge PRN  ?Functional Status Assessment Patient has not had a recent decline in their functional status  ?Frequency and Duration    ?  ?  ?   ? ?Prognosis Prognosis for Safe Diet Advancement: Good  ? ?  ? ?Swallow Study   ?General HPI: Pt is a 41 yo female presenting wtih weakness and AMS, admitted with hypokalemia. CXR and MRI Brain without acute changes. Pt reported difficulty swallowing her large potassium pill. PMH includes: GERD, RA, hypothyroidism, hypomagnesemia, hypokalemia, hyperlipidemia, hypertension, alcohol use with history of withdrawal, seizures (last in  1999) ?Type of Study: Bedside Swallow Evaluation ?Previous Swallow Assessment: none in chart ?Diet Prior to this Study: Thin liquids ?Temperature Spikes Noted: No ?Respiratory Status: Room air ?History of Recent Intubation: No ?Behavior/Cognition: Alert;Cooperative;Pleasant mood ?Oral Cavity Assessment: Within Functional Limits ?Oral Care Completed by SLP: No ?Oral Cavity - Dentition:  Adequate natural dentition ?Vision: Functional for self-feeding ?Self-Feeding Abilities: Able to feed self ?Patient Positioning: Upright in chair ?Baseline Vocal Quality: Normal ?Volitional Cough: Strong ?Volitional Swallow: Able to elicit  ?  ?Oral/Motor/Sensory Function Overall Oral Motor/Sensory Function: Within functional limits   ?Ice Chips Ice chips: Not tested   ?Thin Liquid Thin Liquid: Within functional limits ?Presentation: Self Fed;Straw;Cup  ?  ?Nectar Thick Nectar Thick Liquid: Not tested   ?Honey Thick Honey Thick Liquid: Not tested   ?Puree Puree: Within functional limits ?Presentation: Spoon;Self Fed   ?Solid ? ? ?  Solid: Within functional limits ?Presentation: Self Fed  ? ?  ? ?Samantha Rosales., M.A. CCC-SLP ?Acute Rehabilitation Services ?Pager [phone] ?Office [phone] ? ?02/10/2022,12:45 PM ? ? ? ?

## 2022-02-10 NOTE — Assessment & Plan Note (Signed)
See above

## 2022-02-10 NOTE — Progress Notes (Signed)
Initial Nutrition Assessment ? ?DOCUMENTATION CODES:  ? ?Not applicable ? ?INTERVENTION:  ?- will order Boost Breeze BID, each supplement provides 250 kcal and 9 grams of protein. ?- will order Ensure Plus High Protein BID, each supplement provides 350 kcal and 20 grams of protein. ?- will order 1 tablet multivitamin with minerals daily. ?- complete NFPE when feasible.  ? ? ?NUTRITION DIAGNOSIS:  ? ?Increased nutrient needs related to acute illness as evidenced by estimated needs. ? ?GOAL:  ? ?Patient will meet greater than or equal to 90% of their needs ? ?MONITOR:  ? ?PO intake, Supplement acceptance, Labs, Weight trends ? ?REASON FOR ASSESSMENT:  ? ?Malnutrition Screening Tool ? ?ASSESSMENT:  ? ?41 y.o. female with medical history of idiopathic urticaria, rheumatoid arthritis, pancreatitis, hypothyroidism, hypomagnesemia, hypokalemia, HLD, HTN, and alcohol use, GERD, seizure history last seizure 1999 not on medications. She presented to the ED per direction of PCP due to generalized weakness, confusion, and low K. she reported difficulty walking, weakness, difficulty controlling her head, and hallucinations. She has not been taking prescribed potassium at home due to the pills being too large and difficult to swallow. She was admitted due to hypokalemia. ? ?Unable to see patient at time of attempted visit. She was previously OBS status and has been changed to Inpatient status. ? ?Diet advanced to CLD at 1025 and to Regular at 1130. No meal intakes documented. ? ?MST report note indicates patient reported losing 50 lb in the past 7-8 months. ? ?Weight yesterday was 128 lb, weight on 10/10 was 127 lb, and weight on 02/03/21 was 126 lb. Non-pitting edema to BLE documented in the edema section of flow sheet.  ? ?Per notes: ?- hypokalemia ?- hypophosphatemia ?- hx of alcohol abuse and last drink was on 3/6--drinks beer, no longer liquor ?- dysphagia with swallowing large pills--SLP consulted ?- anemia of chronic  disease ? ? ?Labs reviewed; K: 2 mmol/l, Cl: 97 mmol/l, Ca: 7.2 mg/dl, Phos: 2 mg/dl, Mg: 1.5 mg/dl, LFTs elevated.  ? ?Medications reviewed; 1 mg folvite/day, 40 mg oral protonix BID, 60 mEq Klor-Con x1 dose 3/10, 10 mEq IV KCl x2 runs 3/9 and x6 runs 3/10, 20 mmol IV KPhos x1 run 3/10, 100 mg oral thiamine BID, 220 mg oral zinc sulfate/day. ? ?IVF; NS-40 mEq KCl @ 125 ml/hr. ?  ? ?NUTRITION - FOCUSED PHYSICAL EXAM: ? ?Unable to complete at this time.  ? ?Diet Order:   ?Diet Order   ? ?       ?  Diet regular Room service appropriate? Yes; Fluid consistency: Thin  Diet effective now       ?  ? ?  ?  ? ?  ? ? ?EDUCATION NEEDS:  ? ?No education needs have been identified at this time ? ?Skin:  Skin Assessment: Reviewed RN Assessment ? ?Last BM:  PTA/unknown ? ?Height:  ? ?Ht Readings from Last 1 Encounters:  ?02/09/22 5\' 8"  (1.727 m)  ? ? ?Weight:  ? ?Wt Readings from Last 1 Encounters:  ?02/09/22 57.2 kg  ? ? ?BMI:  Body mass index is 19.17 kg/m?. ? ?Estimated Nutritional Needs:  ?Kcal:  1800-2000 kcal ?Protein:  85-100 grams ?Fluid:  >/= 2.2 L/day ? ? ? ? ?Trenton Gammon, MS, RD, LDN ?Inpatient Clinical Dietitian ?RD pager # available in [facility]ION  ?After hours/weekend pager # available in [facility]ION ? ?

## 2022-02-10 NOTE — Progress Notes (Signed)
?   02/09/22 2200  ?Provider Notification  ?Provider Name/Title Blount MD  ?Date Provider Notified 02/09/22  ?Time Provider Notified 2205  ?Notification Type Page  ?Notification Reason Critical result ?(K <2.0)  ?Test performed and critical result Potassium <2  ?Date Critical Result Received 02/09/22  ?Time Critical Result Received 2139  ? ? ?

## 2022-02-10 NOTE — Progress Notes (Signed)
?   02/10/22 0119  ?Provider Notification  ?Provider Name/Title Alinda Money MD  ?Date Provider Notified 02/10/22  ?Time Provider Notified [phone]  ?Notification Type Page  ?Notification Reason Critical result  ?Test performed and critical result Potassium 2.0  ?Date Critical Result Received 02/10/22  ?Time Critical Result Received 0117  ?Provider response See new orders  ? ? ?

## 2022-02-10 NOTE — Assessment & Plan Note (Signed)
Hemoglobin ranging from 9.8 to 10 g clinically.  Monitor ?

## 2022-02-10 NOTE — Hospital Course (Addendum)
41 y.o. female with medical history significant of idiopathic urticaria, rheumatoid arthritis, pancreatitis, hypothyroidism, hypomagnesemia, hypokalemia, hyperlipidemia, hypertension, alcohol use with history of withdrawal, GERD, seizure history last seizure 1999 not on medications presented generalized weakness some confusion and low potassium level on advice from PCP office.  She has been having difficulty with walking, weakness and difficulty with controlling her head, also reports some hallucination earlier but was improved in the ED, saw her PCP and was sent to the ED. she was prescribed potassium but has not been taking due to pill being large and difficult to swallow,has not been taking Vascepa due to difficulty swallowing.In the ED mildly tachycardic improved subsequently blood work with significant hypokalemia, hypophosphatemia and hypomagnesemia.  Her electrolytes were aggressively repleted at this time has improved.  She is supposed to be on oral potassium supplement at home we will give refill also prescribe oral magnesium oxide.  No signs of alcohol withdrawal cleared by psychiatry, encourage adequate oral nutrition at home which is likely the cause of her electrolyte imbalance and also chronic alcoholism.  ? ?

## 2022-02-10 NOTE — Progress Notes (Signed)
PROGRESS NOTE Samantha Rosales  EXN:[id number] DOB: [birth year] DOA: 02/09/2022 PCP: Donita Brooks, MD   Brief Narrative/Hospital Course: 41 y.o. female with medical history significant of idiopathic urticaria, rheumatoid arthritis, pancreatitis, hypothyroidism, hypomagnesemia, hypokalemia, hyperlipidemia, hypertension, alcohol use with history of withdrawal, GERD, seizure history last seizure 1999 not on medications presented generalized weakness some confusion and low potassium level on advice from PCP office.  She has been having difficulty with walking, weakness and difficulty with controlling her head, also reports some hallucination earlier but was improved in the ED, saw her PCP and was sent to the ED. she was prescribed potassium but has not been taking due to pill being large and difficult to swallow,has not been taking Vascepa due to difficulty swallowing. In the ED mildly tachycardic improved subsequently blood work with significant hypokalemia <2.0,AST 168 which is similar to previous, T. bili 3.1 which is new. Placed on aggressive potassium replacement and admitted.    Subjective: Seen and examined this morning.  She is alert awake and oriented.  Feeling somewhat better, reports she has a low appetite has not been eating very well at home denies diarrhea.  Assessment and Plan: * Hypokalemia Longstanding history of hypokalemia mostly ranging 2.8-3.1 range.  Acute hypokalemia symptomatic with weakness.  We will be replacing aggressively p.o. IV and with NSS-along with IV Phos and magnesium. pharmacy consulted.Also monitor mag and Phos Recent Labs  Lab 02/09/22 1132 02/09/22 1707 02/09/22 2139 02/10/22 0048 02/10/22 0341 02/10/22 0654  K <2.0* <2.0* <2.0* 2.0* 2.0* 2.0*  CALCIUM 7.7* 7.1* 7.2* 6.9* 7.1* 7.2*  MG 1.2*  --  1.7  --  1.5*  --   PHOS  --   --   --   --   --  2.0*    Hypophosphatemia Being repleted.  Pharmacy consulted.  Monitor  Hypomagnesemia Being repleted  iv.  Monitor  Alcohol abuse Elevated LFTs AST 162 2 bili 3.1-likely from alcohol induced. History of alcohol abuse and history of withdrawal: she reports her last drink was on Monday-reports only drinks beer and not liquor now.Continue thiamine folate trend LFTs and continue CIWA- and consider ativan if needed.      Elevated liver enzymes See above  Dysphagia Endorses difficulty swallowing large pill however bedside swallow eval good able to drink liquid potassium.  Speech has been consulted.  Anemia, chronic disease Hemoglobin ranging from 9.8 to 10 g clinically.  Monitor  Generalized weakness Due to hypokalemia.  PT OT eval will be requested once k better.  Rheumatoid arthritis involving multiple sites with positive rheumatoid factor (HCC) On humira  Essential hypertension Continue home Aldactone, Bystolic.  DVT prophylaxis: enoxaparin (LOVENOX) injection 40 mg Start: 02/09/22 2000 Code Status:   Code Status: Full Code Family Communication: plan of care discussed with patient at bedside.  Disposition: Currently not medically stable for discharge. Status is: Observation The patient will require care spanning > 2 midnights and should be moved to inpatient because: Due to ongoing severe electrolyte imbalance and needing IV replacement Objective: Vitals last 24 hrs: Vitals:   02/09/22 2100 02/09/22 2113 02/10/22 0100 02/10/22 0453  BP: (!) 147/106  (!) 157/90 (!) 151/102  Pulse: (!) 109  96 85  Resp: 19  20 20   Temp: 98.4 F (36.9 C)  98.7 F (37.1 C) 98.3 F (36.8 C)  TempSrc: Oral  Oral Oral  SpO2: 100%  98% 98%  Weight:  57.2 kg    Height:  5\' 8"  (1.727 m)  Weight change:   Physical Examination: General exam: Aa0x3, pleasant,older than stated age, weak appearing. HEENT:Oral mucosa moist, Ear/Nose WNL grossly, dentition normal. Respiratory system: bilaterally diminished BS, no use of accessory muscle Cardiovascular system: S1 & S2 +, No JVD,. Gastrointestinal  system: Abdomen soft, mildly distended NT BS+ Nervous System:Alert, awake, moving extremities and grossly nonfocal Extremities: LE edema none,distal peripheral pulses palpable.  Skin: No rashes,no icterus. MSK: Normal muscle bulk,tone, power  Medications reviewed: Scheduled Meds:  cycloSPORINE  1 drop Both Eyes BID   enoxaparin (LOVENOX) injection  40 mg Subcutaneous Q24H   folic acid  1 mg Oral Daily   nebivolol  10 mg Oral Daily   pantoprazole  40 mg Oral BID   sodium chloride flush  3 mL Intravenous Q12H   spironolactone  25 mg Oral Daily   thiamine  100 mg Oral BID   zinc sulfate  220 mg Oral Daily   Continuous Infusions:  0.9 % NaCl with KCl 40 mEq / L 125 mL/hr at 02/10/22 1610   potassium chloride 10 mEq (02/10/22 1049)   potassium PHOSPHATE IVPB (in mmol)      Diet Order             Diet clear liquid Room service appropriate? Yes; Fluid consistency: Thin  Diet effective now                  Intake/Output Summary (Last 24 hours) at 02/10/2022 1056 Last data filed at 02/10/2022 0735 Gross per 24 hour  Intake 1588.33 ml  Output 1750 ml  Net -161.67 ml   Net IO Since Admission: -161.67 mL [02/10/22 1056]  Wt Readings from Last 3 Encounters:  02/09/22 57.2 kg  09/12/21 57.8 kg  09/07/21 54.4 kg     Unresulted Labs (From admission, onward)     Start     Ordered   02/16/22 0500  Creatinine, serum  (enoxaparin (LOVENOX)    CrCl >/= 30 ml/min)  Weekly,   R     Comments: while on enoxaparin therapy    02/09/22 1911   02/11/22 0500  Magnesium  Tomorrow morning,   R       Question:  Specimen collection method  Answer:  Lab=Lab collect   02/10/22 0744   02/11/22 0500  Basic metabolic panel  Tomorrow morning,   R       Question:  Specimen collection method  Answer:  Lab=Lab collect   02/10/22 0802   02/11/22 0500  Hepatic function panel  Daily,   R     Question:  Specimen collection method  Answer:  Lab=Lab collect   02/10/22 1056   Unscheduled  Potassium  Once,    R       Question:  Specimen collection method  Answer:  Lab=Lab collect   02/10/22 1056   Unscheduled  Phosphorus  Tomorrow morning,   R       Question:  Specimen collection method  Answer:  Lab=Lab collect   02/10/22 1056          Data Reviewed: I have personally reviewed following labs and imaging studies CBC: Recent Labs  Lab 02/09/22 1132 02/10/22 0341  WBC 6.4 6.1  NEUTROABS 5.2  --   HGB 11.8* 9.8*  HCT 33.3* 28.6*  MCV 95.7 96.6  PLT 105* 91*   Basic Metabolic Panel: Recent Labs  Lab 02/09/22 1132 02/09/22 1707 02/09/22 2139 02/10/22 0048 02/10/22 0341 02/10/22 0654  NA 137 139 143 140 141 140  K <2.0* <2.0* <2.0* 2.0* 2.0* 2.0*  CL 86* 94* 98 96* 97* 97*  CO2 27 26 25 24 24 25   GLUCOSE 129* 106* 102* 95 94 114*  BUN 15 15 13 11 10 9   CREATININE 0.79 0.68 0.78 0.67 0.54 0.56  CALCIUM 7.7* 7.1* 7.2* 6.9* 7.1* 7.2*  MG 1.2*  --  1.7  --  1.5*  --   PHOS  --   --   --   --   --  2.0*   GFR: Estimated Creatinine Clearance: 83.6 mL/min (by C-G formula based on SCr of 0.56 mg/dL). Liver Function Tests: Recent Labs  Lab 02/09/22 1132 02/10/22 0341  AST 168* 280*  ALT 44 48*  ALKPHOS 100 113  BILITOT 3.1* 1.8*  PROT 8.7* 6.7  ALBUMIN 4.1 3.1*   No results for input(s): LIPASE, AMYLASE in the last 168 hours. Recent Labs  Lab 02/09/22 1134  AMMONIA 34   Coagulation Profile: No results for input(s): INR, PROTIME in the last 168 hours. Cardiac Enzymes: No results for input(s): CKTOTAL, CKMB, CKMBINDEX, TROPONINI in the last 168 hours. BNP (last 3 results) No results for input(s): PROBNP in the last 8760 hours. HbA1C: Recent Labs    02/09/22 1134  HGBA1C 4.6*   CBG: Recent Labs  Lab 02/09/22 1412  GLUCAP 133*   Lipid Profile: No results for input(s): CHOL, HDL, LDLCALC, TRIG, CHOLHDL, LDLDIRECT in the last 72 hours. Thyroid Function Tests: Recent Labs    02/09/22 1134  TSH 1.685   Anemia Panel: Recent Labs    02/09/22 1134   VITAMINB12 381   Sepsis Labs: No results for input(s): PROCALCITON, LATICACIDVEN in the last 168 hours.  Antimicrobials: Anti-infectives (From admission, onward)    None      Culture/Microbiology No results found for: SDES, SPECREQUEST, CULT, REPTSTATUS  Radiology Studies: CT Head Wo Contrast  Result Date: 02/09/2022 CLINICAL DATA:  Mental status change, unknown cause EXAM: CT HEAD WITHOUT CONTRAST TECHNIQUE: Contiguous axial images were obtained from the base of the skull through the vertex without intravenous contrast. RADIATION DOSE REDUCTION: This exam was performed according to the departmental dose-optimization program which includes automated exposure control, adjustment of the mA and/or kV according to patient size and/or use of iterative reconstruction technique. COMPARISON:  06/10/2020 FINDINGS: Brain: No evidence of acute infarction, hemorrhage, hydrocephalus, extra-axial collection or mass lesion/mass effect. Cavum septum pellucidum. Vascular: No hyperdense vessel or unexpected calcification. Skull: Normal. Negative for fracture or focal lesion. Sinuses/Orbits: No acute finding. Other: None. IMPRESSION: No acute intracranial abnormality. Electronically Signed   By: Duanne GuessNicholas  Plundo D.O.   On: 02/09/2022 11:58   MR BRAIN W WO CONTRAST  Result Date: 02/09/2022 CLINICAL DATA:  Mental status change, unknown cause. Ataxia. Neck weakness. Hallucinations. History of alcohol abuse with concern for Wernicke encephalopathy. EXAM: MRI HEAD WITHOUT AND WITH CONTRAST TECHNIQUE: Multiplanar, multiecho pulse sequences of the brain and surrounding structures were obtained without and with intravenous contrast. CONTRAST:  6mL GADAVIST GADOBUTROL 1 MMOL/ML IV SOLN COMPARISON:  Head CT 02/09/2022 FINDINGS: Brain: There is no evidence of an acute infarct, intracranial hemorrhage, mass, midline shift, or extra-axial fluid collection. Several small T2 hyperintensities are present in the subcortical white  matter of the frontal lobes, and there is also minimal T2 hyperintensity in the pons. No abnormal enhancement is identified. A cavum septum pellucidum et vergae is noted. There is borderline age advanced cerebellar atrophy. Supratentorial brain volume is within normal limits. No abnormal signal is seen in  the periaqueductal gray matter, mamillary bodies, or thalami to indicate Wernicke encephalopathy. Vascular: Major intracranial vascular flow voids are preserved. Skull and upper cervical spine: Unremarkable bone marrow signal. Sinuses/Orbits: Unremarkable orbits. Paranasal sinuses and mastoid air cells are clear. Other: None. IMPRESSION: 1. No acute intracranial abnormality. 2. Minimal T2 signal changes in the white matter, nonspecific though may reflect early chronic small vessel ischemic disease, migraines, or prior infection/inflammation. Electronically Signed   By: Sebastian Ache M.D.   On: 02/09/2022 15:31   MR Cervical Spine W or Wo Contrast  Result Date: 02/09/2022 CLINICAL DATA:  Ataxia. Neck weakness. Hallucinations. History of alcohol abuse with concern for Wernicke encephalopathy. EXAM: MRI CERVICAL SPINE WITHOUT AND WITH CONTRAST TECHNIQUE: Multiplanar and multiecho pulse sequences of the cervical spine, to include the craniocervical junction and cervicothoracic junction, were obtained without and with intravenous contrast. CONTRAST:  6mL GADAVIST GADOBUTROL 1 MMOL/ML IV SOLN COMPARISON:  None. FINDINGS: Alignment: Straightening of the normal cervical lordosis. No listhesis. Vertebrae: No fracture, suspicious marrow lesion, or significant marrow edema. Hemangioma involving the posterior aspect of the T2 vertebral body and pedicle on the right. Cord: Normal cord signal and morphology. No abnormal intradural enhancement. Posterior Fossa, vertebral arteries, paraspinal tissues: Posterior fossa more fully evaluated on today's separate head MRI. Preserved vertebral artery flow voids. Disc levels: Preserved  disc space heights. Small central disc protrusion at C3-4 and minimal disc bulging at C4-5 and C5-6 without stenosis. IMPRESSION: 1. Normal appearance of the cervical spinal cord. 2. Mild cervical spondylosis without stenosis. Electronically Signed   By: Sebastian Ache M.D.   On: 02/09/2022 15:22   DG Chest Portable 1 View  Result Date: 02/09/2022 CLINICAL DATA:  Altered mental status EXAM: PORTABLE CHEST 1 VIEW COMPARISON:  Chest radiograph 10/16/2019 FINDINGS: The cardiomediastinal silhouette is normal. There is no focal consolidation or pulmonary edema. There is no pleural effusion or pneumothorax. There is no acute osseous abnormality. IMPRESSION: No radiographic evidence of acute cardiopulmonary process. Electronically Signed   By: Lesia Hausen M.D.   On: 02/09/2022 12:51     LOS: 0 days   Lanae Boast, MD [facility]  02/10/2022, 10:56 AM

## 2022-02-10 NOTE — Assessment & Plan Note (Addendum)
Continue PT OT once K stable hopefully today. ?

## 2022-02-10 NOTE — TOC Progression Note (Signed)
Transition of Care (TOC) - Progression Note  ? ? ?Patient Details  ?Name: Audrie LiaLamisha M Reger ?MRN: [id number] ?Date of Birth: 05/14/1981 ? ?Transition of Care (TOC) CM/SW Contact  ?Geni BersMcGibboney, Exie Chrismer, RN ?Phone Number: ?02/10/2022, 9:45 AM ? ?Clinical Narrative:    ? ? ?Transition of Care (TOC) Screening Note ? ? ?Patient Details  ?Name: Audrie LiaLamisha M Kassab ?Date of Birth: 03/01/1981 ? ? ?Transition of Care (TOC) CM/SW Contact:    ?Geni BersMcGibboney, Joseandres Mazer, RN ?Phone Number: ?02/10/2022, 9:45 AM ? ? ? ?Transition of Care Department [facility](TOC) has reviewed patient and no TOC needs have been identified at this time. We will continue to monitor patient advancement through interdisciplinary progression rounds. If new patient transition needs arise, please place a TOC consult. ?  ? ?  ?  ? ?Expected Discharge Plan and Services ?  ?  ?  ?  ?  ?                ?  ?  ?  ?  ?  ?  ?  ?  ?  ?  ? ? ?Social Determinants of Health (SDOH) Interventions ?  ? ?Readmission Risk Interventions ?No flowsheet data found. ? ?

## 2022-02-11 DIAGNOSIS — R1310 Dysphagia, unspecified: Secondary | ICD-10-CM | POA: Diagnosis not present

## 2022-02-11 DIAGNOSIS — E876 Hypokalemia: Secondary | ICD-10-CM | POA: Diagnosis not present

## 2022-02-11 DIAGNOSIS — R443 Hallucinations, unspecified: Secondary | ICD-10-CM | POA: Diagnosis not present

## 2022-02-11 DIAGNOSIS — F330 Major depressive disorder, recurrent, mild: Secondary | ICD-10-CM | POA: Diagnosis present

## 2022-02-11 LAB — HEPATIC FUNCTION PANEL
ALT: 59 U/L — ABNORMAL HIGH (ref 0–44)
AST: 182 U/L — ABNORMAL HIGH (ref 15–41)
Albumin: 3.5 g/dL (ref 3.5–5.0)
Alkaline Phosphatase: 126 U/L (ref 38–126)
Bilirubin, Direct: 0.3 mg/dL — ABNORMAL HIGH (ref 0.0–0.2)
Indirect Bilirubin: 0.5 mg/dL (ref 0.3–0.9)
Total Bilirubin: 0.8 mg/dL (ref 0.3–1.2)
Total Protein: 7.4 g/dL (ref 6.5–8.1)

## 2022-02-11 LAB — BASIC METABOLIC PANEL
Anion gap: 8 (ref 5–15)
BUN: <5 mg/dL — ABNORMAL LOW (ref 6–20)
CO2: 24 mmol/L (ref 22–32)
Calcium: 6.6 mg/dL — ABNORMAL LOW (ref 8.9–10.3)
Chloride: 104 mmol/L (ref 98–111)
Creatinine, Ser: 0.41 mg/dL — ABNORMAL LOW (ref 0.44–1.00)
GFR, Estimated: >60 mL/min (ref >60)
Glucose, Bld: 203 mg/dL — ABNORMAL HIGH (ref 70–99)
Potassium: 2.9 mmol/L — ABNORMAL LOW (ref 3.5–5.1)
Sodium: 136 mmol/L (ref 135–145)

## 2022-02-11 LAB — POTASSIUM: Potassium: 3.8 mmol/L (ref 3.5–5.1)

## 2022-02-11 LAB — MAGNESIUM
Magnesium: 1.3 mg/dL — ABNORMAL LOW (ref 1.7–2.4)
Magnesium: 1.6 mg/dL — ABNORMAL LOW (ref 1.7–2.4)

## 2022-02-11 LAB — PHOSPHORUS
Phosphorus: 1.4 mg/dL — ABNORMAL LOW (ref 2.5–4.6)
Phosphorus: 2.4 mg/dL — ABNORMAL LOW (ref 2.5–4.6)

## 2022-02-11 MED ORDER — MAGNESIUM SULFATE 2 GM/50ML IV SOLN
2.0000 g | INTRAVENOUS | Status: AC
  Administered 2022-02-11: 2 g via INTRAVENOUS
  Filled 2022-02-11: qty 50

## 2022-02-11 MED ORDER — MAGNESIUM OXIDE -MG SUPPLEMENT 400 (240 MG) MG PO TABS
400.0000 mg | ORAL_TABLET | Freq: Two times a day (BID) | ORAL | Status: DC
  Administered 2022-02-11 – 2022-02-12 (×3): 400 mg via ORAL
  Filled 2022-02-11 (×3): qty 1

## 2022-02-11 MED ORDER — POTASSIUM CHLORIDE 20 MEQ PO PACK
60.0000 meq | PACK | Freq: Once | ORAL | Status: AC
  Administered 2022-02-11: 60 meq via ORAL
  Filled 2022-02-11: qty 3

## 2022-02-11 MED ORDER — METHOCARBAMOL 500 MG PO TABS
500.0000 mg | ORAL_TABLET | Freq: Once | ORAL | Status: AC
Start: 2022-02-11 — End: 2022-02-11
  Administered 2022-02-11: 500 mg via ORAL
  Filled 2022-02-11: qty 1

## 2022-02-11 MED ORDER — CALCIUM CARBONATE ANTACID 500 MG PO CHEW
1.0000 | CHEWABLE_TABLET | Freq: Two times a day (BID) | ORAL | Status: DC
Start: 2022-02-11 — End: 2022-02-12
  Administered 2022-02-11 – 2022-02-12 (×3): 200 mg via ORAL
  Filled 2022-02-11 (×3): qty 1

## 2022-02-11 MED ORDER — POTASSIUM PHOSPHATES 15 MMOLE/5ML IV SOLN
20.0000 mmol | Freq: Once | INTRAVENOUS | Status: AC
  Administered 2022-02-11: 20 mmol via INTRAVENOUS
  Filled 2022-02-11 (×2): qty 6.67

## 2022-02-11 MED ORDER — K PHOS MONO-SOD PHOS DI & MONO 155-852-130 MG PO TABS
500.0000 mg | ORAL_TABLET | Freq: Three times a day (TID) | ORAL | Status: AC
  Administered 2022-02-11 (×3): 500 mg via ORAL
  Filled 2022-02-11 (×3): qty 2

## 2022-02-11 NOTE — Consult Note (Signed)
[facility] Face-to-Face Psychiatry Consult   Reason for Consult: Hallucinations Referring Physician:  Lanae Boast Patient Identification: Samantha Rosales MRN:  [id number] Principal Diagnosis: Hypokalemia Diagnosis:  Principal Problem:   Hypokalemia Active Problems:   Elevated liver enzymes   Essential hypertension   High triglycerides   Hypomagnesemia   Alcohol abuse   Rheumatoid arthritis involving multiple sites with positive rheumatoid factor (HCC)   Generalized weakness   Anemia, chronic disease   Hypophosphatemia   Dysphagia   Hypocalcemia   MDD (major depressive disorder), recurrent episode, mild (HCC)   Total Time Spent in Direct Patient Care:  I personally spent 45 minutes on the unit in direct patient care. The direct patient care time included face-to-face time with the patient, reviewing the patient's chart, communicating with other professionals, and coordinating care. Greater than 50% of this time was spent in counseling or coordinating care with the patient regarding goals of hospitalization, psycho-education, and discharge planning needs.  Subjective: "I started prednisone the day I came to the hospital."  HPI:  Samantha Rosales is a 40 y.o. female patient admitted with medical history significant of idiopathic urticaria, rheumatoid arthritis, pancreatitis, hypothyroidism, hypomagnesemia, hypokalemia, hyperlipidemia, hypertension, alcohol use with history of withdrawal, GERD, seizure history last seizure 1999 not on medications presenting with ongoing weakness and some altered mental status.  Patient has had diffuse weakness with inability to walk and difficulty controlling her head today.  Also reports some hallucination.  And was reportedly encephalopathic earlier though has improved in the ED.  Saw her PCP earlier today and was sent to the ED for evaluation.   States that she is prescribed potassium but has been taking it due to the pill being large and difficult to  swallow.  She also has not been taking Vascepa due to difficulty swallowing..  On evaluation today, patient reports a history of mild depression.  She states that she has been prescribed Lexapro, but does not take this regularly.  She states she has not had a dose of Lexapro for 2 weeks.  Patient states that she has rheumatoid arthritis and follows with her rheumatologist and prescribed Humira and folic acid.  She had her Humira dose on the Wednesday prior to admission.  The morning she came to the hospital, she states that she called her rheumatologist and said that she was unable to walk, and was started on prednisone.  She recalls that she took 1 dose of prednisone before coming to the hospital.  Patient denies any recollection of hallucinations.  She denies any past history of hallucinations, particularly in association with depressed mood.  She denies any suicidal or homicidal ideation. Patient states that she feels ready to go home, however was told that her electrolyte levels are not yet normalized.  She is able to verbalize understanding risks of abnormal electrolyte balance, and is willing to stay in the hospital for treatment.  Substance use: Patient states that she smokes cigarettes 1/2 pack/day; she drinks Seltzer beer 3-4 a day.  Labs reviewed: Urine drug screen negative; blood alcohol level less than 10. Altered electrolyte panel, which could be contributing.  Past Psychiatric History: Depression, managed by PCP.  She has not had any inpatient psychiatric treatment  Risk to Self: Denies Risk to Others: Denies Prior Inpatient Therapy: Denies Prior Outpatient Therapy: Prescribed Lexapro, which she takes "as needed"  Past Medical History:  Past Medical History:  Diagnosis Date   Abnormal Pap smear    Alcohol use    Allergic rhinitis  BV (bacterial vaginosis)    GERD (gastroesophageal reflux disease)    Hepatitis    LFT's >1000 (2019) due to methotrexate vs alcohol   High  cholesterol    HSV-2 (herpes simplex virus 2) infection    Hypertension    Hypothyroidism 10/17/2019   Pancreatitis    12/19 (thought to be due to alcohol)   Rheumatoid arthritis (HCC)    Seizures (HCC)    dx as child with epilepsy but no seizures since 89 and on no meds   Vaginal Pap smear, abnormal     Past Surgical History:  Procedure Laterality Date   CERVICAL CONIZATION W/BX  12/20/2012   Procedure: CONIZATION CERVIX WITH BIOPSY;  Surgeon: Lazaro Arms, MD;  Location: [facility];  Service: Gynecology;  Laterality: N/A;   HOLMIUM LASER APPLICATION  12/20/2012   Procedure: HOLMIUM LASER APPLICATION;  Surgeon: Lazaro Arms, MD;  Location: [facility];  Service: Gynecology;  Laterality: N/A;   Family History:  Family History  Problem Relation Age of Onset   Hypertension Mother    Hypertension Father    Pancreatic cancer Paternal Uncle    Colon cancer Neg Hx    Esophageal cancer Neg Hx    Inflammatory bowel disease Neg Hx    Liver disease Neg Hx    Rectal cancer Neg Hx    Stomach cancer Neg Hx    Family Psychiatric  History: None indicated  Social History:  Social History   Substance and Sexual Activity  Alcohol Use Yes   Alcohol/week: 28.0 standard drinks   Types: 28 Standard drinks or equivalent per week   Comment: 4-6 drinks per day depending what theyre doing.     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   Drug use: No   Sexual activity: Yes    Birth control/protection: Injection    Comment: Depo Provera every 3 months  Other Topics Concern   Not on file  Social History Narrative   Not on file   Social Determinants of Health   Financial Resource Strain: Not on file  Food Insecurity: Not on file  Transportation Needs: Not on file  Physical Activity: Not on file  Stress: Not on file  Social Connections: Not on file   Additional Social History:    Allergies:   Allergies  Allergen Reactions   Penicillins Hives    Has patient had a PCN reaction causing immediate rash, facial/tongue/throat swelling, SOB or lightheadedness with hypotension: Unknown Has patient had a PCN reaction causing severe rash involving mucus membranes or skin necrosis: Unknown Has patient had a PCN reaction that required hospitalization: Unknown  Has patient had a PCN reaction occurring within the last 10 years: No If all of the above answers are "NO", then may proceed with Cephalosporin use.     Labs:  Results for orders placed or performed during the hospital encounter of 02/09/22 (from the past 48 hour(s))  Urinalysis, Routine w reflex microscopic     Status: Abnormal   Collection Time: 02/09/22 11:11 AM  Result Value Ref Range   Color, Urine YELLOW YELLOW   APPearance CLEAR CLEAR   Specific Gravity, Urine 1.009 1.005 - 1.030   pH 7.0 5.0 - 8.0   Glucose, UA NEGATIVE NEGATIVE mg/dL   Hgb urine dipstick MODERATE (A) NEGATIVE   Bilirubin Urine NEGATIVE NEGATIVE   Ketones, ur 20 (A) NEGATIVE mg/dL   Protein, ur NEGATIVE NEGATIVE mg/dL   Nitrite NEGATIVE NEGATIVE   Leukocytes,Ua NEGATIVE NEGATIVE   RBC / HPF 0-5 0 - 5 RBC/hpf   WBC, UA 0-5 0 - 5 WBC/hpf   Bacteria, UA RARE (A) NONE SEEN   Squamous Epithelial / LPF 0-5 0 - 5    Comment: Performed at [facility], [address], [place], [place] [address]  CBC with Differential     Status: Abnormal   Collection Time: 02/09/22 11:32 AM  Result Value Ref Range   WBC 6.4 4.0 - 10.5 K/uL   RBC 3.48 (L) 3.87 - 5.11 MIL/uL   Hemoglobin 11.8 (L) 12.0 - 15.0 g/dL   HCT 60.4 (L) 54.0 - 98.1 %   MCV 95.7 80.0 - 100.0 fL   MCH 33.9 26.0 - 34.0 pg   MCHC 35.4 30.0 - 36.0 g/dL   RDW 19.1 47.8 - 29.5 %   Platelets 105 (L) 150 - 400 K/uL    Comment: SPECIMEN CHECKED FOR CLOTS Immature Platelet Fraction may be clinically indicated, consider ordering this additional test [id number]    nRBC 0.0 0.0 - 0.2 %   Neutrophils Relative % 81 %   Neutro Abs 5.2 1.7 - 7.7 K/uL   Lymphocytes Relative 8 %   Lymphs Abs 0.5 (L) 0.7 - 4.0 K/uL   Monocytes Relative 10 %   Monocytes Absolute 0.7 0.1 - 1.0 K/uL   Eosinophils Relative 0 %   Eosinophils Absolute 0.0 0.0 - 0.5 K/uL   Basophils Relative 0 %   Basophils Absolute 0.0 0.0 - 0.1 K/uL   RBC Morphology MORPHOLOGY UNREMARKABLE    Immature Granulocytes 1 %   Abs Immature Granulocytes 0.04 0.00 - 0.07 K/uL    Comment: Performed at [facility], [address] [place], [place] [address]  Comprehensive metabolic panel     Status: Abnormal   Collection Time: 02/09/22 11:32 AM  Result Value Ref Range   Sodium 137 135 - 145 mmol/L    Comment: REPEATED TO VERIFY   Potassium <2.0 (LL) 3.5 - 5.1 mmol/L    Comment: REPEATED TO VERIFY CRITICAL RESULT CALLED TO, READ BACK BY AND VERIFIED WITH: DOSS,M. RN AT 1256 02/09/22 MULLINS,T    Chloride 86 (L) 98 - 111 mmol/L    Comment: REPEATED TO VERIFY   CO2 27 22 - 32 mmol/L    Comment: REPEATED TO VERIFY   Glucose, Bld 129 (H) 70 - 99 mg/dL    Comment: Glucose reference range applies only to samples taken after fasting for at least 8 hours.   BUN 15 6 - 20 mg/dL   Creatinine, Ser 6.29 0.44 - 1.00 mg/dL   Calcium 7.7 (L) 8.9 -  10.3 mg/dL   Total Protein 8.7 (H) 6.5 - 8.1 g/dL    Albumin 4.1 3.5 - 5.0 g/dL   AST 914 (H) 15 - 41 U/L   ALT 44 0 - 44 U/L   Alkaline Phosphatase 100 38 - 126 U/L   Total Bilirubin 3.1 (H) 0.3 - 1.2 mg/dL   GFR, Estimated >78 >29 mL/min    Comment: (NOTE) Calculated using the CKD-EPI Creatinine Equation (2021)    Anion gap 24 (H) 5 - 15    Comment: REPEATED TO VERIFY Performed at [facility], [address] [place], [place] [address]   Magnesium     Status: Abnormal   Collection Time: 02/09/22 11:32 AM  Result Value Ref Range   Magnesium 1.2 (L) 1.7 - 2.4 mg/dL    Comment: Performed at [facility], [address], [place], [place] [address]  Ammonia     Status: None   Collection Time: 02/09/22 11:34 AM  Result Value Ref Range   Ammonia 34 9 - 35 umol/L    Comment: Performed at [facility], [address], [place], [place] [address]  TSH     Status: None   Collection Time: 02/09/22 11:34 AM  Result Value Ref Range   TSH 1.685 0.350 - 4.500 uIU/mL    Comment: Performed by a 3rd Generation assay with a functional sensitivity of <=0.01 uIU/mL. Performed at [facility], [address], [place], [place] [address]   Vitamin B12     Status: None   Collection Time: 02/09/22 11:34 AM  Result Value Ref Range   Vitamin B-12 381 180 - 914 pg/mL    Comment: (NOTE) This assay is not validated for testing neonatal or myeloproliferative syndrome specimens for Vitamin B12 levels. Performed at [facility], [address], [place], [place] [address]   Hemoglobin A1c     Status: Abnormal   Collection Time: 02/09/22 11:34 AM  Result Value Ref Range   Hgb A1c MFr Bld 4.6 (L) 4.8 - 5.6 %    Comment: (NOTE) Pre diabetes:          5.7%-6.4%  Diabetes:              >6.4%  Glycemic control for   <7.0% adults with diabetes    Mean Plasma Glucose 85.32 mg/dL    Comment: Performed at [facility], [address] [place], [place]  [address]  Rapid urine drug screen (hospital performed)     Status: None   Collection Time: 02/09/22 11:34 AM  Result Value Ref Range   Opiates NONE DETECTED NONE DETECTED   Cocaine NONE DETECTED NONE DETECTED   Benzodiazepines NONE DETECTED NONE DETECTED   Amphetamines NONE DETECTED NONE DETECTED   Tetrahydrocannabinol NONE DETECTED NONE DETECTED   Barbiturates NONE DETECTED NONE DETECTED    Comment: (NOTE) DRUG SCREEN FOR MEDICAL PURPOSES ONLY.  IF CONFIRMATION IS NEEDED FOR ANY PURPOSE, NOTIFY LAB WITHIN 5 DAYS.  LOWEST DETECTABLE LIMITS FOR URINE DRUG SCREEN Drug Class                     Cutoff (ng/mL) Amphetamine and metabolites    1000 Barbiturate and metabolites    200 Benzodiazepine                 200 Tricyclics and metabolites     300 Opiates and metabolites        300 Cocaine and  metabolites        300 THC                            50 Performed at [facility], [address], [place], [place] [address]   Resp Panel by RT-PCR (Flu A&B, Covid) Nasopharyngeal Swab     Status: None   Collection Time: 02/09/22 11:42 AM   Specimen: Nasopharyngeal Swab; Nasopharyngeal(NP) swabs in vial transport medium  Result Value Ref Range   SARS Coronavirus 2 by RT PCR NEGATIVE NEGATIVE    Comment: (NOTE) SARS-CoV-2 target nucleic acids are NOT DETECTED.  The SARS-CoV-2 RNA is generally detectable in upper respiratory specimens during the acute phase of infection. The lowest concentration of SARS-CoV-2 viral copies this assay can detect is 138 copies/mL. A negative result does not preclude SARS-Cov-2 infection and should not be used as the sole basis for treatment or other patient management decisions. A negative result may occur with  improper specimen collection/handling, submission of specimen other than nasopharyngeal swab, presence of viral mutation(s) within the areas targeted by this assay, and inadequate number of viral copies(<138 copies/mL). A negative  result must be combined with clinical observations, patient history, and epidemiological information. The expected result is Negative.  Fact Sheet for Patients:  BloggerCourse.com  Fact Sheet for Healthcare Providers:  SeriousBroker.it  This test is no t yet approved or cleared by the [place] FDA and  has been authorized for detection and/or diagnosis of SARS-CoV-2 by FDA under an Emergency Use Authorization (EUA). This EUA will remain  in effect (meaning this test can be used) for the duration of the COVID-19 declaration under Section 564(b)(1) of the Act, 21 U.S.C.section 360bbb-3(b)(1), unless the authorization is terminated  or revoked sooner.       Influenza A by PCR NEGATIVE NEGATIVE   Influenza B by PCR NEGATIVE NEGATIVE    Comment: (NOTE) The Xpert Xpress SARS-CoV-2/FLU/RSV plus assay is intended as an aid in the diagnosis of influenza from Nasopharyngeal swab specimens and should not be used as a sole basis for treatment. Nasal washings and aspirates are unacceptable for Xpert Xpress SARS-CoV-2/FLU/RSV testing.  Fact Sheet for Patients: BloggerCourse.com  Fact Sheet for Healthcare Providers: SeriousBroker.it  This test is not yet approved or cleared by the [place] FDA and has been authorized for detection and/or diagnosis of SARS-CoV-2 by FDA under an Emergency Use Authorization (EUA). This EUA will remain in effect (meaning this test can be used) for the duration of the COVID-19 declaration under Section 564(b)(1) of the Act, 21 U.S.C. section 360bbb-3(b)(1), unless the authorization is terminated or revoked.  Performed at [facility], [address], [place], [place] [address]   Ethanol     Status: None   Collection Time: 02/09/22 11:42 AM  Result Value Ref Range   Alcohol, Ethyl (B) <10 <10 mg/dL    Comment: (NOTE) Lowest  detectable limit for serum alcohol is 10 mg/dL.  For medical purposes only. Performed at [facility], [address], [place], [place] [address]   CBG monitoring, ED     Status: Abnormal   Collection Time: 02/09/22  2:12 PM  Result Value Ref Range   Glucose-Capillary 133 (H) 70 - 99 mg/dL    Comment: Glucose reference range applies only to samples taken after fasting for at least 8 hours.  Basic metabolic panel     Status: Abnormal   Collection Time:  02/09/22  5:07 PM  Result Value Ref Range   Sodium 139 135 - 145 mmol/L   Potassium <2.0 (LL) 3.5 - 5.1 mmol/L    Comment: CONSISTENT WITH PREVIOUS RESULT CRITICAL RESULT CALLED TO, READ BACK BY AND VERIFIED WITH: GARRISON,G. RN AT 1103 02/09/22 MULLINS,T    Chloride 94 (L) 98 - 111 mmol/L   CO2 26 22 - 32 mmol/L   Glucose, Bld 106 (H) 70 - 99 mg/dL    Comment: Glucose reference range applies only to samples taken after fasting for at least 8 hours.   BUN 15 6 - 20 mg/dL   Creatinine, Ser 1.59 0.44 - 1.00 mg/dL   Calcium 7.1 (L) 8.9 - 10.3 mg/dL   GFR, Estimated >45 >85 mL/min    Comment: (NOTE) Calculated using the CKD-EPI Creatinine Equation (2021)    Anion gap 19 (H) 5 - 15    Comment: Performed at [facility], [address], [place], [place] [address]  HIV Antibody (routine testing w rflx)     Status: None   Collection Time: 02/09/22  9:39 PM  Result Value Ref Range   HIV Screen 4th Generation wRfx Non Reactive Non Reactive    Comment: Performed at [facility], [address], [place], [place] [address]  Basic metabolic panel     Status: Abnormal   Collection Time: 02/09/22  9:39 PM  Result Value Ref Range   Sodium 143 135 - 145 mmol/L   Potassium <2.0 (LL) 3.5 - 5.1 mmol/L    Comment: CRITICAL RESULT CALLED TO, READ BACK BY AND VERIFIED WITH:  ASHLEY LANG RN 02/09/22 @ 2201 VS    Chloride 98 98 - 111 mmol/L   CO2 25 22 - 32 mmol/L   Glucose, Bld 102 (H) 70 - 99 mg/dL     Comment: Glucose reference range applies only to samples taken after fasting for at least 8 hours.   BUN 13 6 - 20 mg/dL   Creatinine, Ser 3.81 0.44 - 1.00 mg/dL   Calcium 7.2 (L) 8.9 - 10.3 mg/dL   GFR, Estimated >77 >11 mL/min    Comment: (NOTE) Calculated using the CKD-EPI Creatinine Equation (2021)    Anion gap 20 (H) 5 - 15    Comment: Performed at [facility], [address], [place], [place] [address]  Magnesium     Status: None   Collection Time: 02/09/22  9:39 PM  Result Value Ref Range   Magnesium 1.7 1.7 - 2.4 mg/dL    Comment: Performed at [facility], [address] [place], [place] [address]  Basic metabolic panel     Status: Abnormal   Collection Time: 02/10/22 12:48 AM  Result Value Ref Range   Sodium 140 135 - 145 mmol/L   Potassium 2.0 (LL) 3.5 - 5.1 mmol/L    Comment: CRITICAL RESULT CALLED TO, READ BACK BY AND VERIFIED WITH:  PAMELA HAMILTON RN 02/10/22 @ 0115 VS    Chloride 96 (L) 98 - 111 mmol/L   CO2 24 22 - 32 mmol/L   Glucose, Bld 95 70 - 99 mg/dL    Comment: Glucose reference range applies only to samples taken after fasting for at least 8 hours.   BUN 11 6 - 20 mg/dL   Creatinine, Ser 8.32 0.44 - 1.00 mg/dL   Calcium 6.9 (L) 8.9 - 10.3 mg/dL   GFR, Estimated >91 >91 mL/min    Comment: (NOTE) Calculated using the CKD-EPI Creatinine  Equation (2021)    Anion gap 20 (H) 5 - 15    Comment: Performed at [facility], [address] [place], [place] [address]  CBC     Status: Abnormal   Collection Time: 02/10/22  3:41 AM  Result Value Ref Range   WBC 6.1 4.0 - 10.5 K/uL   RBC 2.96 (L) 3.87 - 5.11 MIL/uL   Hemoglobin 9.8 (L) 12.0 - 15.0 g/dL   HCT 60.4 (L) 54.0 - 98.1 %   MCV 96.6 80.0 - 100.0 fL   MCH 33.1 26.0 - 34.0 pg   MCHC 34.3 30.0 - 36.0 g/dL   RDW 19.1 47.8 - 29.5 %   Platelets 91 (L) 150 - 400 K/uL    Comment: Immature Platelet Fraction may be clinically indicated,  consider ordering this additional test [id number]    nRBC 0.0 0.0 - 0.2 %    Comment: Performed at [facility], [address], [place], [place] [address]  Comprehensive metabolic panel     Status: Abnormal   Collection Time: 02/10/22  3:41 AM  Result Value Ref Range   Sodium 141 135 - 145 mmol/L   Potassium 2.0 (LL) 3.5 - 5.1 mmol/L    Comment: CRITICAL RESULT CALLED TO, READ BACK BY AND VERIFIED WITH:  Malen Gauze RN 02/10/22 @ 0457 VS    Chloride 97 (L) 98 - 111 mmol/L   CO2 24 22 - 32 mmol/L   Glucose, Bld 94 70 - 99 mg/dL    Comment: Glucose reference range applies only to samples taken after fasting for at least 8 hours.   BUN 10 6 - 20 mg/dL   Creatinine, Ser 6.29 0.44 - 1.00 mg/dL   Calcium 7.1 (L) 8.9 - 10.3 mg/dL   Total Protein 6.7 6.5 - 8.1 g/dL   Albumin 3.1 (L) 3.5 - 5.0 g/dL   AST 528 (H) 15 - 41 U/L   ALT 48 (H) 0 - 44 U/L   Alkaline Phosphatase 113 38 - 126 U/L   Total Bilirubin 1.8 (H) 0.3 - 1.2 mg/dL   GFR, Estimated >41 >32 mL/min    Comment: (NOTE) Calculated using the CKD-EPI Creatinine Equation (2021)    Anion gap 20 (H) 5 - 15    Comment: Performed at [facility], [address] [place], [place] [address]  Magnesium     Status: Abnormal   Collection Time: 02/10/22  3:41 AM  Result Value Ref Range   Magnesium 1.5 (L) 1.7 - 2.4 mg/dL    Comment: Performed at [facility], [address], [place], [place] [address]  Basic metabolic panel     Status: Abnormal   Collection Time: 02/10/22  6:54 AM  Result Value Ref Range   Sodium 140 135 - 145 mmol/L   Potassium 2.0 (LL) 3.5 - 5.1 mmol/L    Comment: CRITICAL RESULT CALLED TO, READ BACK BY AND VERIFIED WITH: LANGE,A AT 6644 ON 02/10/2022 BY LUZOLOP    Chloride 97 (L) 98 - 111 mmol/L   CO2 25 22 - 32 mmol/L   Glucose, Bld 114 (H) 70 - 99 mg/dL    Comment: Glucose reference range applies only to samples taken after fasting for at least 8 hours.    BUN 9 6 - 20 mg/dL   Creatinine, Ser 0.34 0.44 - 1.00 mg/dL   Calcium 7.2 (L) 8.9 - 10.3 mg/dL   GFR, Estimated >74 >25 mL/min    Comment: (NOTE) Calculated  using the CKD-EPI Creatinine Equation (2021)    Anion gap 18 (H) 5 - 15    Comment: Performed at [facility], [address] [place], [place] [address]  Phosphorus     Status: Abnormal   Collection Time: 02/10/22  6:54 AM  Result Value Ref Range   Phosphorus 2.0 (L) 2.5 - 4.6 mg/dL    Comment: Performed at [facility], [address] [place], [place] [address]  Potassium     Status: Abnormal   Collection Time: 02/10/22  7:11 PM  Result Value Ref Range   Potassium 2.8 (L) 3.5 - 5.1 mmol/L    Comment: DELTA CHECK NOTED Performed at [facility], [address] [place], [place] [address]   Basic metabolic panel     Status: Abnormal   Collection Time: 02/11/22  1:45 AM  Result Value Ref Range   Sodium 136 135 - 145 mmol/L   Potassium 2.9 (L) 3.5 - 5.1 mmol/L   Chloride 104 98 - 111 mmol/L   CO2 24 22 - 32 mmol/L   Glucose, Bld 203 (H) 70 - 99 mg/dL    Comment: Glucose reference range applies only to samples taken after fasting for at least 8 hours.   BUN <5 (L) 6 - 20 mg/dL   Creatinine, Ser 2.440.41 (L) 0.44 - 1.00 mg/dL   Calcium 6.6 (L) 8.9 - 10.3 mg/dL   GFR, Estimated >01>60 >02>60 mL/min    Comment: (NOTE) Calculated using the CKD-EPI Creatinine Equation (2021)    Anion gap 8 5 - 15    Comment: Performed at [facility], [address] [place], [place] [address]  Magnesium     Status: Abnormal   Collection Time: 02/11/22  1:45 AM  Result Value Ref Range   Magnesium 1.3 (L) 1.7 - 2.4 mg/dL    Comment: Performed at [facility], [address] [place], [place] [address]  Phosphorus     Status: Abnormal   Collection Time: 02/11/22  1:45 AM  Result Value Ref Range   Phosphorus 1.4 (L) 2.5 - 4.6 mg/dL    Comment: Performed  at [facility], [address] [place], [place] [address]  Magnesium     Status: Abnormal   Collection Time: 02/11/22  8:10 AM  Result Value Ref Range   Magnesium 1.6 (L) 1.7 - 2.4 mg/dL    Comment: Performed at [facility], [address] [place], [place] [address]  Phosphorus     Status: Abnormal   Collection Time: 02/11/22  8:10 AM  Result Value Ref Range   Phosphorus 2.4 (L) 2.5 - 4.6 mg/dL    Comment: Performed at [facility], [address] [place], [place] [id number]  Hepatic function panel     Status: Abnormal   Collection Time: 02/11/22  8:10 AM  Result Value Ref Range   Total Protein 7.4 6.5 - 8.1 g/dL   Albumin 3.5 3.5 - 5.0 g/dL   AST 329182 (H) 15 - 41 U/L   ALT 59 (H) 0 - 44 U/L   Alkaline Phosphatase 126 38 - 126 U/L   Total Bilirubin 0.8 0.3 - 1.2 mg/dL   Bilirubin, Direct 0.3 (H) 0.0 - 0.2 mg/dL   Indirect Bilirubin 0.5 0.3 - 0.9 mg/dL    Comment: Performed at [facility], [address] [place], [place] [id number]    Current Facility-Administered Medications  Medication Dose Route  Frequency Provider Last Rate Last Admin   0.9 % NaCl with KCl 40 mEq / L  infusion   Intravenous Continuous Lanae Boast, MD 125 mL/hr at 02/11/22 0801 New Bag at 02/11/22 0801   acetaminophen (TYLENOL) tablet 650 mg  650 mg Oral Q6H PRN Synetta Fail, MD       Or   acetaminophen (TYLENOL) suppository 650 mg  650 mg Rectal Q6H PRN Synetta Fail, MD       calcium carbonate (TUMS - dosed in mg elemental calcium) chewable tablet 200 mg of elemental calcium  1 tablet Oral BID WC Kc, Ramesh, MD       cycloSPORINE (RESTASIS) 0.05 % ophthalmic emulsion 1 drop  1 drop Both Eyes BID Synetta Fail, MD   1 drop at 02/11/22 0924   enoxaparin (LOVENOX) injection 40 mg  40 mg Subcutaneous Q24H Synetta Fail, MD   40 mg at 02/10/22 2040   feeding supplement (BOOST / RESOURCE BREEZE) liquid 1 Container  1  Container Oral BID BM Lanae Boast, MD   1 Container at 02/11/22 0956   feeding supplement (ENSURE ENLIVE / ENSURE PLUS) liquid 237 mL  237 mL Oral BID BM Kc, Ramesh, MD   237 mL at 02/10/22 2050   folic acid (FOLVITE) tablet 1 mg  1 mg Oral Daily Synetta Fail, MD   1 mg at 02/11/22 1610   lip balm (CARMEX) ointment   Topical PRN Lanae Boast, MD   75 application. at 02/10/22 0618   magnesium oxide (MAG-OX) tablet 400 mg  400 mg Oral BID Kc, Dayna Barker, MD       multivitamin with minerals tablet 1 tablet  1 tablet Oral Daily Kc, Ramesh, MD   1 tablet at 02/11/22 0923   nebivolol (BYSTOLIC) tablet 10 mg  10 mg Oral Daily Kc, Dayna Barker, MD   10 mg at 02/11/22 0924   pantoprazole (PROTONIX) EC tablet 40 mg  40 mg Oral BID Synetta Fail, MD   40 mg at 02/11/22 9604   phosphorus (K PHOS NEUTRAL) tablet 500 mg  500 mg Oral TID Lanae Boast, MD   500 mg at 02/11/22 0955   polyethylene glycol (MIRALAX / GLYCOLAX) packet 17 g  17 g Oral Daily PRN Synetta Fail, MD       sodium chloride flush (NS) 0.9 % injection 3 mL  3 mL Intravenous Q12H Synetta Fail, MD   3 mL at 02/10/22 1037   thiamine tablet 100 mg  100 mg Oral BID Synetta Fail, MD   100 mg at 02/11/22 5409   zinc sulfate capsule 220 mg  220 mg Oral Daily Lanae Boast, MD   220 mg at 02/11/22 8119    Musculoskeletal: Strength & Muscle Tone: within normal limits Gait & Station: normal Patient leans: N/A            Psychiatric Specialty Exam:  Presentation  General Appearance: Appropriate for Environment; Casual Eye Contact:Good Speech:Clear and Coherent Speech Volume:Normal Handedness:No data recorded  Mood and Affect  Mood:Euthymic Affect:Appropriate; Congruent  Thought Process  Thought Processes:Linear Descriptions of Associations:Intact  Orientation:Full (Time, Place and Person)  Thought Content:Logical  History of Schizophrenia/Schizoaffective disorder:No data recorded Duration of Psychotic  Symptoms:No data recorded Hallucinations:Hallucinations: None  Ideas of Reference:None  Suicidal Thoughts:Suicidal Thoughts: No  Homicidal Thoughts:Homicidal Thoughts: No   Sensorium  Memory:Immediate Good; Recent Good; Remote Good Judgment:Good Insight:Good  Executive Functions  Concentration:Good Attention Span:Good Recall:Good Fund of Knowledge:Good  Language:Good  Psychomotor Activity  Psychomotor Activity:Psychomotor Activity: Normal  Assets  Assets:Communication Skills; Desire for Improvement; Financial Resources/Insurance; Housing; Intimacy; Resilience; Social Support  Sleep  Sleep:Sleep: Good  Physical Exam: Physical Exam Vitals and nursing note (Nursing at bedside, has not witnessed patient experiencing AVH) reviewed.  Constitutional:      Appearance: Normal appearance.  Pulmonary:     Effort: Pulmonary effort is normal. No respiratory distress.  Musculoskeletal:        General: Normal range of motion.  Neurological:     General: No focal deficit present.     Mental Status: She is alert and oriented to person, place, and time.   Review of Systems  Psychiatric/Behavioral:  Negative for depression, hallucinations and suicidal ideas. The patient is not nervous/anxious and does not have insomnia.   Blood pressure (!) 167/119, pulse 85, temperature 98.7 F (37.1 C), temperature source Oral, resp. rate 19, height 5\' 8"  (1.727 m), weight 57.2 kg, SpO2 100 %. Body mass index is 19.17 kg/m.  Treatment Plan Summary: AMBERLEY HAMLER is a 41 y.o. female with a history of rheumatoid arthritis and altered electrolytes who presented with psychotic features in the context of starting prednisone.  Patient has previously taken prednisone without hallucinations.  I have asked her to review reaction to prednisone in context of recent Humira dose and altered electrolytes to more closely monitor in future versus avoidance of steroids.  At this time, patient has not been taking  antidepressants.  Please provide resources at discharge for outpatient mental health support for therapy and psychiatrist.  -No psychiatric medication recommendations at this time   Disposition: No evidence of imminent risk to self or others at present.   Patient does not meet criteria for psychiatric inpatient admission. Supportive therapy provided about ongoing stressors. Provide psychiatric resources at discharge, and ensure safety plan to return to hospital ED, [facility], or call 911 for worsening depression or suicidal/homicidal ideation or psychotic symptoms.  Samantha Craft, MD 02/11/2022 10:31 AM

## 2022-02-11 NOTE — Assessment & Plan Note (Signed)
See above

## 2022-02-11 NOTE — Progress Notes (Signed)
PROGRESS NOTE Samantha Rosales  [id number] DOB: 02-15-1981 DOA: 02/09/2022 PCP: [name], MD   Brief Narrative/Hospital Course: 41 y.o. female with medical history significant of idiopathic urticaria, rheumatoid arthritis, pancreatitis, hypothyroidism, hypomagnesemia, hypokalemia, hyperlipidemia, hypertension, alcohol use with history of withdrawal, GERD, seizure history last seizure 1999 not on medications presented generalized weakness some confusion and low potassium level on advice from PCP office.  She has been having difficulty with walking, weakness and difficulty with controlling her head, also reports some hallucination earlier but was improved in the ED, saw her PCP and was sent to the ED. she was prescribed potassium but has not been taking due to pill being large and difficult to swallow,has not been taking Vascepa due to difficulty swallowing. In the ED mildly tachycardic improved subsequently blood work with significant hypokalemia <2.0,AST 168 which is similar to previous, T. bili 3.1 which is new. Placed on aggressive potassium replacement and admitted.    Subjective: Seen and examined this morning.  Resting in the bedside chair no complaints no hallucination no tremors. Overnight no fever blood pressure 140s to 160s Potassium coming up still low 2.9 and phosphorus was 1.4 x 1.3 patient finishing out 20 mm of K-Phos, received 2 g of mag mag rider.  Assessment and Plan: * Hypokalemia Hypomagnesemia Hypophosphatemia Hypocalcemia (corrected ca 7): Longstanding history of Hypokalemia mostly ranging 2.8-3.1 range.  Acute hypokalemia symptomatic with weakness.  Slowly coming up with aggressive replacement-on IVF  Normal saline w/ 40 meq kcl, give 60 p.o. x1, finishing her K-Phos, repeat K level this afternoon.  Give additional 2 g mag rider, start K-Phos 3 times daily, calcium carbonate, Mag-Ox. And will need scheduled oral electrolytes. Recent Labs  Lab 02/09/22 1132  02/09/22 1707 02/09/22 2139 02/10/22 0048 02/10/22 0341 02/10/22 0654 02/10/22 1911 02/11/22 0145  K <2.0*   < > <2.0* 2.0* 2.0* 2.0* 2.8* 2.9*  CALCIUM 7.7*   < > 7.2* 6.9* 7.1* 7.2*  --  6.6*  MG 1.2*  --  1.7  --  1.5*  --   --  1.3*  PHOS  --   --   --   --   --  2.0*  --  1.4*   < > = values in this interval not displayed.    Hypocalcemia See above  Hypophosphatemia See above  Hypomagnesemia See above  Alcohol abuse Elevated LFTs AST 162 2 bili 3.1-likely from alcohol induced. History of alcohol abuse and history of withdrawal: she reports her last drink was on Monday-reports only drinks beer and not liquor now.Continue thiamine folate trend LFTs and continue CIWA- and consider ativan if needed.Psych was consulted question hallucination  Elevated liver enzymes See above  Dysphagia Seen by SLP.  Anemia, chronic disease Hemoglobin ranging from 9.8 to 10 g clinically.  Monitor  Generalized weakness Continue PT OT once K stable hopefully today.  Rheumatoid arthritis involving multiple sites with positive rheumatoid factor ([place]) On humira  Essential hypertension Continue home Bystolic.  Not on Aldactone.    DVT prophylaxis: enoxaparin (LOVENOX) injection 40 mg Start: 02/09/22 2000 Code Status:   Code Status: Full Code Family Communication: plan of care discussed with patient at bedside.  Disposition: Currently not medically stable for discharge. Status is: Inpatient Remains hospitalized for ongoing management of severe electrolyte imbalance.    Objective: Vitals last 24 hrs: Vitals:   02/10/22 0453 02/10/22 1158 02/10/22 2043 02/11/22 0700  BP: (!) 151/102 (!) 153/108 (!) 141/102 (!) 167/119  Pulse: 85 (!) 106  91 85  Resp: 20 19 19    Temp: 98.3 F (36.8 C) 98.4 F (36.9 C) 98.4 F (36.9 C) 98.7 F (37.1 C)  TempSrc: Oral  Oral Oral  SpO2: 98% 100% 100% 100%  Weight:      Height:       Weight change:   Physical Examination: General exam:  AAOX3,older than stated age, weak appearing. HEENT:Oral mucosa moist, Ear/Nose WNL grossly, dentition normal. Respiratory system: bilaterally clear,no use of accessory muscle Cardiovascular system: S1 & S2 +, No JVD,. Gastrointestinal system: Abdomen soft,NT,ND, BS+ Nervous System:Alert, awake, moving extremities and grossly nonfocal Extremities: edema neg,distal peripheral pulses palpable.  Skin: No rashes,no icterus. MSK: Normal muscle bulk,tone, power   Medications reviewed: Scheduled Meds:  cycloSPORINE  1 drop Both Eyes BID   enoxaparin (LOVENOX) injection  40 mg Subcutaneous Q24H   feeding supplement  1 Container Oral BID BM   feeding supplement  237 mL Oral BID BM   folic acid  1 mg Oral Daily   multivitamin with minerals  1 tablet Oral Daily   nebivolol  10 mg Oral Daily   pantoprazole  40 mg Oral BID   phosphorus  500 mg Oral TID   sodium chloride flush  3 mL Intravenous Q12H   thiamine  100 mg Oral BID   zinc sulfate  220 mg Oral Daily   Continuous Infusions:  0.9 % NaCl with KCl 40 mEq / L 125 mL/hr at 02/11/22 0801   magnesium sulfate bolus IVPB 2 g (02/11/22 0928)    Diet Order             Diet regular Room service appropriate? Yes; Fluid consistency: Thin  Diet effective now                  Intake/Output Summary (Last 24 hours) at 02/11/2022 1011 Last data filed at 02/11/2022 0600 Gross per 24 hour  Intake 4349.18 ml  Output --  Net 4349.18 ml   Net IO Since Admission: 4,187.51 mL [02/11/22 1011]  Wt Readings from Last 3 Encounters:  02/09/22 57.2 kg  09/12/21 57.8 kg  09/07/21 54.4 kg     Unresulted Labs (From admission, onward)     Start     Ordered   02/16/22 0500  Creatinine, serum  (enoxaparin (LOVENOX)    CrCl >/= 30 ml/min)  Weekly,   R     Comments: while on enoxaparin therapy    02/09/22 1911   02/11/22 1130  Potassium  Once,   R       Question:  Specimen collection method  Answer:  Lab=Lab collect   02/11/22 0820   02/11/22 0500   Hepatic function panel  Daily,   R     Question:  Specimen collection method  Answer:  Lab=Lab collect   02/10/22 1056          Data Reviewed: I have personally reviewed following labs and imaging studies CBC: Recent Labs  Lab 02/09/22 1132 02/10/22 0341  WBC 6.4 6.1  NEUTROABS 5.2  --   HGB 11.8* 9.8*  HCT 33.3* 28.6*  MCV 95.7 96.6  PLT 105* 91*   Basic Metabolic Panel: Recent Labs  Lab 02/09/22 1132 02/09/22 1707 02/09/22 2139 02/10/22 0048 02/10/22 0341 02/10/22 0654 02/10/22 1911 02/11/22 0145 02/11/22 0810  NA 137   < > 143 140 141 140  --  136  --   K <2.0*   < > <2.0* 2.0* 2.0* 2.0* 2.8* 2.9*  --  CL 86*   < > 98 96* 97* 97*  --  104  --   CO2 27   < > 25 24 24 25   --  24  --   GLUCOSE 129*   < > 102* 95 94 114*  --  203*  --   BUN 15   < > 13 11 10 9   --  <5*  --   CREATININE 0.79   < > 0.78 0.67 0.54 0.56  --  0.41*  --   CALCIUM 7.7*   < > 7.2* 6.9* 7.1* 7.2*  --  6.6*  --   MG 1.2*  --  1.7  --  1.5*  --   --  1.3* 1.6*  PHOS  --   --   --   --   --  2.0*  --  1.4* 2.4*   < > = values in this interval not displayed.   GFR: Estimated Creatinine Clearance: 83.6 mL/min (A) (by C-G formula based on SCr of 0.41 mg/dL (L)). Liver Function Tests: Recent Labs  Lab 02/09/22 1132 02/10/22 0341 02/11/22 0810  AST 168* 280* 182*  ALT 44 48* 59*  ALKPHOS 100 113 126  BILITOT 3.1* 1.8* 0.8  PROT 8.7* 6.7 7.4  ALBUMIN 4.1 3.1* 3.5   No results for input(s): LIPASE, AMYLASE in the last 168 hours. Recent Labs  Lab 02/09/22 1134  AMMONIA 34   Coagulation Profile: No results for input(s): INR, PROTIME in the last 168 hours. Cardiac Enzymes: No results for input(s): CKTOTAL, CKMB, CKMBINDEX, TROPONINI in the last 168 hours. BNP (last 3 results) No results for input(s): PROBNP in the last 8760 hours. HbA1C: Recent Labs    02/09/22 1134  HGBA1C 4.6*   CBG: Recent Labs  Lab 02/09/22 1412  GLUCAP 133*   Lipid Profile: No results for input(s):  CHOL, HDL, LDLCALC, TRIG, CHOLHDL, LDLDIRECT in the last 72 hours. Thyroid Function Tests: Recent Labs    02/09/22 1134  TSH 1.685   Anemia Panel: Recent Labs    02/09/22 1134  VITAMINB12 381   Sepsis Labs: No results for input(s): PROCALCITON, LATICACIDVEN in the last 168 hours.  Antimicrobials: Anti-infectives (From admission, onward)    None      Culture/Microbiology No results found for: SDES, SPECREQUEST, CULT, REPTSTATUS  Radiology Studies: CT Head Wo Contrast  Result Date: 02/09/2022 CLINICAL DATA:  Mental status change, unknown cause EXAM: CT HEAD WITHOUT CONTRAST TECHNIQUE: Contiguous axial images were obtained from the base of the skull through the vertex without intravenous contrast. RADIATION DOSE REDUCTION: This exam was performed according to the departmental dose-optimization program which includes automated exposure control, adjustment of the mA and/or kV according to patient size and/or use of iterative reconstruction technique. COMPARISON:  06/10/2020 FINDINGS: Brain: No evidence of acute infarction, hemorrhage, hydrocephalus, extra-axial collection or mass lesion/mass effect. Cavum septum pellucidum. Vascular: No hyperdense vessel or unexpected calcification. Skull: Normal. Negative for fracture or focal lesion. Sinuses/Orbits: No acute finding. Other: None. IMPRESSION: No acute intracranial abnormality. Electronically Signed   By: Davina Poke D.O.   On: 02/09/2022 11:58   MR BRAIN W WO CONTRAST  Result Date: 02/09/2022 CLINICAL DATA:  Mental status change, unknown cause. Ataxia. Neck weakness. Hallucinations. History of alcohol abuse with concern for Wernicke encephalopathy. EXAM: MRI HEAD WITHOUT AND WITH CONTRAST TECHNIQUE: Multiplanar, multiecho pulse sequences of the brain and surrounding structures were obtained without and with intravenous contrast. CONTRAST:  49mL GADAVIST GADOBUTROL 1 MMOL/ML IV SOLN  COMPARISON:  Head CT 02/09/2022 FINDINGS: Brain: There  is no evidence of an acute infarct, intracranial hemorrhage, mass, midline shift, or extra-axial fluid collection. Several small T2 hyperintensities are present in the subcortical white matter of the frontal lobes, and there is also minimal T2 hyperintensity in the pons. No abnormal enhancement is identified. A cavum septum pellucidum et vergae is noted. There is borderline age advanced cerebellar atrophy. Supratentorial brain volume is within normal limits. No abnormal signal is seen in the periaqueductal gray matter, mamillary bodies, or thalami to indicate Wernicke encephalopathy. Vascular: Major intracranial vascular flow voids are preserved. Skull and upper cervical spine: Unremarkable bone marrow signal. Sinuses/Orbits: Unremarkable orbits. Paranasal sinuses and mastoid air cells are clear. Other: None. IMPRESSION: 1. No acute intracranial abnormality. 2. Minimal T2 signal changes in the white matter, nonspecific though may reflect early chronic small vessel ischemic disease, migraines, or prior infection/inflammation. Electronically Signed   By: Logan Bores M.D.   On: 02/09/2022 15:31   MR Cervical Spine W or Wo Contrast  Result Date: 02/09/2022 CLINICAL DATA:  Ataxia. Neck weakness. Hallucinations. History of alcohol abuse with concern for Wernicke encephalopathy. EXAM: MRI CERVICAL SPINE WITHOUT AND WITH CONTRAST TECHNIQUE: Multiplanar and multiecho pulse sequences of the cervical spine, to include the craniocervical junction and cervicothoracic junction, were obtained without and with intravenous contrast. CONTRAST:  58mL GADAVIST GADOBUTROL 1 MMOL/ML IV SOLN COMPARISON:  None. FINDINGS: Alignment: Straightening of the normal cervical lordosis. No listhesis. Vertebrae: No fracture, suspicious marrow lesion, or significant marrow edema. Hemangioma involving the posterior aspect of the T2 vertebral body and pedicle on the right. Cord: Normal cord signal and morphology. No abnormal intradural enhancement.  Posterior Fossa, vertebral arteries, paraspinal tissues: Posterior fossa more fully evaluated on today's separate head MRI. Preserved vertebral artery flow voids. Disc levels: Preserved disc space heights. Small central disc protrusion at C3-4 and minimal disc bulging at C4-5 and C5-6 without stenosis. IMPRESSION: 1. Normal appearance of the cervical spinal cord. 2. Mild cervical spondylosis without stenosis. Electronically Signed   By: Logan Bores M.D.   On: 02/09/2022 15:22   DG Chest Portable 1 View  Result Date: 02/09/2022 CLINICAL DATA:  Altered mental status EXAM: PORTABLE CHEST 1 VIEW COMPARISON:  Chest radiograph 10/16/2019 FINDINGS: The cardiomediastinal silhouette is normal. There is no focal consolidation or pulmonary edema. There is no pleural effusion or pneumothorax. There is no acute osseous abnormality. IMPRESSION: No radiographic evidence of acute cardiopulmonary process. Electronically Signed   By: Valetta Mole M.D.   On: 02/09/2022 12:51     LOS: 1 day   Antonieta Pert, MD [facility]  02/11/2022, 10:11 AM

## 2022-02-12 DIAGNOSIS — E876 Hypokalemia: Secondary | ICD-10-CM | POA: Diagnosis not present

## 2022-02-12 LAB — HEPATIC FUNCTION PANEL
ALT: 52 U/L — ABNORMAL HIGH (ref 0–44)
AST: 117 U/L — ABNORMAL HIGH (ref 15–41)
Albumin: 3.2 g/dL — ABNORMAL LOW (ref 3.5–5.0)
Alkaline Phosphatase: 112 U/L (ref 38–126)
Bilirubin, Direct: 0.2 mg/dL (ref 0.0–0.2)
Indirect Bilirubin: 0.2 mg/dL — ABNORMAL LOW (ref 0.3–0.9)
Total Bilirubin: 0.4 mg/dL (ref 0.3–1.2)
Total Protein: 6.7 g/dL (ref 6.5–8.1)

## 2022-02-12 LAB — MAGNESIUM: Magnesium: 1.5 mg/dL — ABNORMAL LOW (ref 1.7–2.4)

## 2022-02-12 LAB — POTASSIUM: Potassium: 4.4 mmol/L (ref 3.5–5.1)

## 2022-02-12 LAB — PHOSPHORUS: Phosphorus: 4 mg/dL (ref 2.5–4.6)

## 2022-02-12 MED ORDER — MAGNESIUM OXIDE -MG SUPPLEMENT 400 (240 MG) MG PO TABS: 400.0000 mg | ORAL_TABLET | Freq: Two times a day (BID) | ORAL | 0 refills | Status: AC

## 2022-02-12 MED ORDER — ENSURE ENLIVE PO LIQD
237.0000 mL | Freq: Two times a day (BID) | ORAL | 0 refills | Status: DC
Start: 2022-02-12 — End: 2022-02-19

## 2022-02-12 MED ORDER — POTASSIUM CHLORIDE CRYS ER 20 MEQ PO TBCR: 20.0000 meq | EXTENDED_RELEASE_TABLET | Freq: Every day | ORAL | 0 refills | Status: DC

## 2022-02-12 MED ORDER — MAGNESIUM SULFATE 2 GM/50ML IV SOLN
2.0000 g | Freq: Once | INTRAVENOUS | Status: DC
Start: 2022-02-12 — End: 2022-02-12
  Filled 2022-02-12: qty 50

## 2022-02-12 MED ORDER — CALCIUM CARBONATE ANTACID 500 MG PO CHEW: 1.0000 | CHEWABLE_TABLET | Freq: Two times a day (BID) | ORAL | 0 refills | Status: AC

## 2022-02-12 NOTE — Discharge Summary (Signed)
Physician Discharge Summary  Samantha Rosales VFI:[id number] DOB: 1980-12-16 DOA: 02/09/2022  PCP: Donita Brooks, MD  Admit date: 02/09/2022 Discharge date: 02/12/2022  Admitted From: home Disposition:  home  Recommendations for Outpatient Follow-up:  Follow up with PCP in 1-2 weeks Please obtain BMP/CBC/mag in one week  Home Health:no  Equipment/Devices: none  Discharge Condition: Stable Code Status:   Code Status: Full Code Diet recommendation:  Diet Order             Diet regular Room service appropriate? Yes; Fluid consistency: Thin  Diet effective now                    Brief/Interim Summary: 41 y.o. female with medical history significant of idiopathic urticaria, rheumatoid arthritis, pancreatitis, hypothyroidism, hypomagnesemia, hypokalemia, hyperlipidemia, hypertension, alcohol use with history of withdrawal, GERD, seizure history last seizure 1999 not on medications presented generalized weakness some confusion and low potassium level on advice from PCP office.  She has been having difficulty with walking, weakness and difficulty with controlling her head, also reports some hallucination earlier but was improved in the ED, saw her PCP and was sent to the ED. she was prescribed potassium but has not been taking due to pill being large and difficult to swallow,has not been taking Vascepa due to difficulty swallowing.In the ED mildly tachycardic improved subsequently blood work with significant hypokalemia, hypophosphatemia and hypomagnesemia.  Her electrolytes were aggressively repleted at this time has improved.  She is supposed to be on oral potassium supplement at home we will give refill also prescribe oral magnesium oxide.  No signs of alcohol withdrawal cleared by psychiatry, encourage adequate oral nutrition at home which is likely the cause of her electrolyte imbalance and also chronic alcoholism.     Discharge Diagnoses:  *  Hypokalemia Hypomagnesemia Hypophosphatemia Hypocalcemia (corrected ca 7): Longstanding history of Hypokalemia mostly ranging 2.8-3.1 range. Electrolytes at this time have improved, continue oral supplement at home prescription sent.  Follow-up with PCP and repeat labs in 5 to 7 days.  Recent Labs  Lab 02/09/22 2139 02/10/22 0048 02/10/22 0341 02/10/22 0654 02/10/22 1911 02/11/22 0145 02/11/22 0810 02/11/22 1126 02/12/22 0350  K <2.0* 2.0* 2.0* 2.0* 2.8* 2.9*  --  3.8 4.4  CALCIUM 7.2* 6.9* 7.1* 7.2*  --  6.6*  --   --   --   MG 1.7  --  1.5*  --   --  1.3* 1.6*  --  1.5*  PHOS  --   --   --  2.0*  --  1.4* 2.4*  --  4.0    Hypocalcemia See above  Hypophosphatemia See above  Hypomagnesemia See above  Alcohol abuse Elevated LFTs AST 162 2 bili 3.1-likely from alcohol induced. History of alcohol abuse and history of withdrawal: she reports her last drink was on Monday-reports only drinks beer and not liquor now.no signs of withdrawal, alcohol cessation was advised.  Seen by psych and cleared.    Elevated liver enzymes See above  Dysphagia Seen by SLP.  Anemia, chronic disease Hemoglobin ranging from 9.8 to 10 g clinically.  Monitor  Generalized weakness Continue PT OT once K stable hopefully today.  Rheumatoid arthritis involving multiple sites with positive rheumatoid factor (HCC) On humira  Essential hypertension Continue home Bystolic.  Not on Aldactone.    Consults: psych  Subjective: Alert awake oriented, reports she feels better she wants to go home today.  No hallucination no tremors.  No more weak and  eating better Discharge Exam: Vitals:   02/12/22 0508 02/12/22 0600  BP: (!) 169/118 (!) 150/92  Pulse: 87   Resp: 20   Temp: 98.6 F (37 C)   SpO2: 99%    General: Pt is alert, awake, not in acute distress Cardiovascular: RRR, S1/S2 +, no rubs, no gallops Respiratory: CTA bilaterally, no wheezing, no rhonchi Abdominal: Soft, NT, ND, bowel  sounds + Extremities: no edema, no cyanosis  Discharge Instructions  Discharge Instructions     Discharge instructions   Complete by: As directed    Check CBC BMP in 1  Please call call MD or return to ER for similar or worsening recurring problem that brought you to hospital or if any fever,nausea/vomiting,abdominal pain, uncontrolled pain, chest pain,  shortness of breath or any other alarming symptoms.  Please follow-up your doctor as instructed in a week time and call the office for appointment.  Please avoid alcohol, smoking, or any other illicit substance and maintain healthy habits including taking your regular medications as prescribed.  You were cared for by a hospitalist during your hospital stay. If you have any questions about your discharge medications or the care you received while you were in the hospital after you are discharged, you can call the unit and ask to speak with the hospitalist on call if the hospitalist that took care of you is not available.  Once you are discharged, your primary care physician will handle any further medical issues. Please note that NO REFILLS for any discharge medications will be authorized once you are discharged, as it is imperative that you return to your primary care physician (or establish a relationship with a primary care physician if you do not have one) for your aftercare needs so that they can reassess your need for medications and monitor your lab values   Increase activity slowly   Complete by: As directed       Allergies as of 02/12/2022       Reactions   Penicillins Hives   Has patient had a PCN reaction causing immediate rash, facial/tongue/throat swelling, SOB or lightheadedness with hypotension: Unknown Has patient had a PCN reaction causing severe rash involving mucus membranes or skin necrosis: Unknown Has patient had a PCN reaction that required hospitalization: Unknown Has patient had a PCN reaction occurring within  the last 10 years: No If all of the above answers are "NO", then may proceed with Cephalosporin use.        Medication List     STOP taking these medications    spironolactone 25 MG tablet Commonly known as: Aldactone       TAKE these medications    Biotin 1000 MCG tablet Take 1,000 mcg by mouth daily.   calcium carbonate 500 MG chewable tablet Commonly known as: TUMS - dosed in mg elemental calcium Chew 1 tablet (200 mg of elemental calcium total) by mouth 2 (two) times daily with a meal.   cycloSPORINE 0.05 % ophthalmic emulsion Commonly known as: RESTASIS Place 1 drop into both eyes 2 (two) times daily.   escitalopram 10 MG tablet Commonly known as: LEXAPRO TAKE 1 TABLET BY MOUTH EVERY DAY What changed: when to take this   feeding supplement Liqd Take 237 mLs by mouth 2 (two) times daily between meals for 7 days.   fluticasone 50 MCG/ACT nasal spray Commonly known as: FLONASE USE TWO SPRAY(S) IN EACH NOSTRIL ONCE DAILY What changed: See the new instructions.   folic acid 1 MG tablet  Commonly known as: FOLVITE Take 1 mg by mouth daily.   Humira Pen 40 MG/0.4ML Pnkt Generic drug: Adalimumab Inject 40 mg into the skin every 14 (fourteen) days.   ibuprofen 600 MG tablet Commonly known as: ADVIL Take 1 tablet (600 mg total) by mouth every 6 (six) hours as needed. What changed:  how much to take reasons to take this   icosapent Ethyl 1 g capsule Commonly known as: Vascepa Take 2 capsules (2 g total) by mouth 2 (two) times daily.   magnesium oxide 400 (240 Mg) MG tablet Commonly known as: MAG-OX Take 1 tablet (400 mg total) by mouth 2 (two) times daily for 7 days.   medroxyPROGESTERone 150 MG/ML injection Commonly known as: DEPO-PROVERA Inject 1 mL (150 mg total) into the muscle every 3 (three) months. INJECT 1 ML INTRAMUSCULARLY IN DOCTOR'S OFFICE EVERY 3 MONTHS. What changed: additional instructions   nebivolol 10 MG tablet Commonly known as:  Bystolic Take 1 tablet (10 mg total) by mouth daily.   pantoprazole 40 MG tablet Commonly known as: PROTONIX TAKE 1 TABLET BY MOUTH TWICE A DAY   potassium chloride SA 20 MEQ tablet Commonly known as: KLOR-CON M Take 1 tablet (20 mEq total) by mouth daily for 14 days.   predniSONE 5 MG tablet Commonly known as: DELTASONE Take 5 mg by mouth See admin instructions. Dose pack for 12 days - 48 tablets   thiamine 100 MG tablet Take 100 mg by mouth 2 (two) times daily.   vitamin D3 50 MCG (2000 UT) Caps Take 4,000 Units by mouth daily.   zinc gluconate 50 MG tablet Take 50 mg by mouth daily.        Follow-up Information     Donita Brooks, MD Follow up in 1 week(s).   Specialty: Family Medicine Contact information: [address] [place] [address] [phone]                Allergies  Allergen Reactions   Penicillins Hives    Has patient had a PCN reaction causing immediate rash, facial/tongue/throat swelling, SOB or lightheadedness with hypotension: Unknown Has patient had a PCN reaction causing severe rash involving mucus membranes or skin necrosis: Unknown Has patient had a PCN reaction that required hospitalization: Unknown Has patient had a PCN reaction occurring within the last 10 years: No If all of the above answers are "NO", then may proceed with Cephalosporin use.     The results of significant diagnostics from this hospitalization (including imaging, microbiology, ancillary and laboratory) are listed below for reference.    Microbiology: Recent Results (from the past 240 hour(s))  Resp Panel by RT-PCR (Flu A&B, Covid) Nasopharyngeal Swab     Status: None   Collection Time: 02/09/22 11:42 AM   Specimen: Nasopharyngeal Swab; Nasopharyngeal(NP) swabs in vial transport medium  Result Value Ref Range Status   SARS Coronavirus 2 by RT PCR NEGATIVE NEGATIVE Final    Comment: (NOTE) SARS-CoV-2 target nucleic acids are NOT DETECTED.  The  SARS-CoV-2 RNA is generally detectable in upper respiratory specimens during the acute phase of infection. The lowest concentration of SARS-CoV-2 viral copies this assay can detect is 138 copies/mL. A negative result does not preclude SARS-Cov-2 infection and should not be used as the sole basis for treatment or other patient management decisions. A negative result may occur with  improper specimen collection/handling, submission of specimen other than nasopharyngeal swab, presence of viral mutation(s) within the areas targeted by this  assay, and inadequate number of viral copies(<138 copies/mL). A negative result must be combined with clinical observations, patient history, and epidemiological information. The expected result is Negative.  Fact Sheet for Patients:  BloggerCourse.com  Fact Sheet for Healthcare Providers:  SeriousBroker.it  This test is no t yet approved or cleared by the [place] FDA and  has been authorized for detection and/or diagnosis of SARS-CoV-2 by FDA under an Emergency Use Authorization (EUA). This EUA will remain  in effect (meaning this test can be used) for the duration of the COVID-19 declaration under Section 564(b)(1) of the Act, 21 U.S.C.section 360bbb-3(b)(1), unless the authorization is terminated  or revoked sooner.       Influenza A by PCR NEGATIVE NEGATIVE Final   Influenza B by PCR NEGATIVE NEGATIVE Final    Comment: (NOTE) The Xpert Xpress SARS-CoV-2/FLU/RSV plus assay is intended as an aid in the diagnosis of influenza from Nasopharyngeal swab specimens and should not be used as a sole basis for treatment. Nasal washings and aspirates are unacceptable for Xpert Xpress SARS-CoV-2/FLU/RSV testing.  Fact Sheet for Patients: BloggerCourse.com  Fact Sheet for Healthcare Providers: SeriousBroker.it  This test is not yet approved or  cleared by the [place] FDA and has been authorized for detection and/or diagnosis of SARS-CoV-2 by FDA under an Emergency Use Authorization (EUA). This EUA will remain in effect (meaning this test can be used) for the duration of the COVID-19 declaration under Section 564(b)(1) of the Act, 21 U.S.C. section 360bbb-3(b)(1), unless the authorization is terminated or revoked.  Performed at [facility], [address] [place], [place] [address]     Procedures/Studies: CT Head Wo Contrast  Result Date: 02/09/2022 CLINICAL DATA:  Mental status change, unknown cause EXAM: CT HEAD WITHOUT CONTRAST TECHNIQUE: Contiguous axial images were obtained from the base of the skull through the vertex without intravenous contrast. RADIATION DOSE REDUCTION: This exam was performed according to the departmental dose-optimization program which includes automated exposure control, adjustment of the mA and/or kV according to patient size and/or use of iterative reconstruction technique. COMPARISON:  06/10/2020 FINDINGS: Brain: No evidence of acute infarction, hemorrhage, hydrocephalus, extra-axial collection or mass lesion/mass effect. Cavum septum pellucidum. Vascular: No hyperdense vessel or unexpected calcification. Skull: Normal. Negative for fracture or focal lesion. Sinuses/Orbits: No acute finding. Other: None. IMPRESSION: No acute intracranial abnormality. Electronically Signed   By: Duanne Guess D.O.   On: 02/09/2022 11:58   MR BRAIN W WO CONTRAST  Result Date: 02/09/2022 CLINICAL DATA:  Mental status change, unknown cause. Ataxia. Neck weakness. Hallucinations. History of alcohol abuse with concern for Wernicke encephalopathy. EXAM: MRI HEAD WITHOUT AND WITH CONTRAST TECHNIQUE: Multiplanar, multiecho pulse sequences of the brain and surrounding structures were obtained without and with intravenous contrast. CONTRAST:  6mL GADAVIST GADOBUTROL 1 MMOL/ML IV SOLN COMPARISON:  Head CT  02/09/2022 FINDINGS: Brain: There is no evidence of an acute infarct, intracranial hemorrhage, mass, midline shift, or extra-axial fluid collection. Several small T2 hyperintensities are present in the subcortical white matter of the frontal lobes, and there is also minimal T2 hyperintensity in the pons. No abnormal enhancement is identified. A cavum septum pellucidum et vergae is noted. There is borderline age advanced cerebellar atrophy. Supratentorial brain volume is within normal limits. No abnormal signal is seen in the periaqueductal gray matter, mamillary bodies, or thalami to indicate Wernicke encephalopathy. Vascular: Major intracranial vascular flow voids are preserved. Skull and upper cervical spine: Unremarkable bone marrow signal. Sinuses/Orbits: Unremarkable orbits. Paranasal  sinuses and mastoid air cells are clear. Other: None. IMPRESSION: 1. No acute intracranial abnormality. 2. Minimal T2 signal changes in the white matter, nonspecific though may reflect early chronic small vessel ischemic disease, migraines, or prior infection/inflammation. Electronically Signed   By: Sebastian Ache M.D.   On: 02/09/2022 15:31   MR Cervical Spine W or Wo Contrast  Result Date: 02/09/2022 CLINICAL DATA:  Ataxia. Neck weakness. Hallucinations. History of alcohol abuse with concern for Wernicke encephalopathy. EXAM: MRI CERVICAL SPINE WITHOUT AND WITH CONTRAST TECHNIQUE: Multiplanar and multiecho pulse sequences of the cervical spine, to include the craniocervical junction and cervicothoracic junction, were obtained without and with intravenous contrast. CONTRAST:  44mL GADAVIST GADOBUTROL 1 MMOL/ML IV SOLN COMPARISON:  None. FINDINGS: Alignment: Straightening of the normal cervical lordosis. No listhesis. Vertebrae: No fracture, suspicious marrow lesion, or significant marrow edema. Hemangioma involving the posterior aspect of the T2 vertebral body and pedicle on the right. Cord: Normal cord signal and morphology. No  abnormal intradural enhancement. Posterior Fossa, vertebral arteries, paraspinal tissues: Posterior fossa more fully evaluated on today's separate head MRI. Preserved vertebral artery flow voids. Disc levels: Preserved disc space heights. Small central disc protrusion at C3-4 and minimal disc bulging at C4-5 and C5-6 without stenosis. IMPRESSION: 1. Normal appearance of the cervical spinal cord. 2. Mild cervical spondylosis without stenosis. Electronically Signed   By: Sebastian Ache M.D.   On: 02/09/2022 15:22   DG Chest Portable 1 View  Result Date: 02/09/2022 CLINICAL DATA:  Altered mental status EXAM: PORTABLE CHEST 1 VIEW COMPARISON:  Chest radiograph 10/16/2019 FINDINGS: The cardiomediastinal silhouette is normal. There is no focal consolidation or pulmonary edema. There is no pleural effusion or pneumothorax. There is no acute osseous abnormality. IMPRESSION: No radiographic evidence of acute cardiopulmonary process. Electronically Signed   By: Lesia Hausen M.D.   On: 02/09/2022 12:51    Labs: BNP (last 3 results) No results for input(s): BNP in the last 8760 hours. Basic Metabolic Panel: Recent Labs  Lab 02/09/22 2139 02/10/22 0048 02/10/22 0341 02/10/22 0654 02/10/22 1911 02/11/22 0145 02/11/22 0810 02/11/22 1126 02/12/22 0350  NA 143 140 141 140  --  136  --   --   --   K <2.0* 2.0* 2.0* 2.0* 2.8* 2.9*  --  3.8 4.4  CL 98 96* 97* 97*  --  104  --   --   --   CO2 25 24 24 25   --  24  --   --   --   GLUCOSE 102* 95 94 114*  --  203*  --   --   --   BUN 13 11 10 9   --  <5*  --   --   --   CREATININE 0.78 0.67 0.54 0.56  --  0.41*  --   --   --   CALCIUM 7.2* 6.9* 7.1* 7.2*  --  6.6*  --   --   --   MG 1.7  --  1.5*  --   --  1.3* 1.6*  --  1.5*  PHOS  --   --   --  2.0*  --  1.4* 2.4*  --  4.0   Liver Function Tests: Recent Labs  Lab 02/09/22 1132 02/10/22 0341 02/11/22 0810 02/12/22 0350  AST 168* 280* 182* 117*  ALT 44 48* 59* 52*  ALKPHOS 100 113 126 112  BILITOT 3.1*  1.8* 0.8 0.4  PROT 8.7* 6.7 7.4 6.7  ALBUMIN 4.1  3.1* 3.5 3.2*   No results for input(s): LIPASE, AMYLASE in the last 168 hours. Recent Labs  Lab 02/09/22 1134  AMMONIA 34   CBC: Recent Labs  Lab 02/09/22 1132 02/10/22 0341  WBC 6.4 6.1  NEUTROABS 5.2  --   HGB 11.8* 9.8*  HCT 33.3* 28.6*  MCV 95.7 96.6  PLT 105* 91*   Cardiac Enzymes: No results for input(s): CKTOTAL, CKMB, CKMBINDEX, TROPONINI in the last 168 hours. BNP: Invalid input(s): POCBNP CBG: Recent Labs  Lab 02/09/22 1412  GLUCAP 133*   D-Dimer No results for input(s): DDIMER in the last 72 hours. Hgb A1c Recent Labs    02/09/22 1134  HGBA1C 4.6*   Lipid Profile No results for input(s): CHOL, HDL, LDLCALC, TRIG, CHOLHDL, LDLDIRECT in the last 72 hours. Thyroid function studies Recent Labs    02/09/22 1134  TSH 1.685   Anemia work up Recent Labs    02/09/22 1134  VITAMINB12 381   Urinalysis    Component Value Date/Time   COLORURINE YELLOW 02/09/2022 1111   APPEARANCEUR CLEAR 02/09/2022 1111   LABSPEC 1.009 02/09/2022 1111   PHURINE 7.0 02/09/2022 1111   GLUCOSEU NEGATIVE 02/09/2022 1111   HGBUR MODERATE (A) 02/09/2022 1111   BILIRUBINUR NEGATIVE 02/09/2022 1111   KETONESUR 20 (A) 02/09/2022 1111   PROTEINUR NEGATIVE 02/09/2022 1111   UROBILINOGEN 0.2 12/13/2012 1517   NITRITE NEGATIVE 02/09/2022 1111   LEUKOCYTESUR NEGATIVE 02/09/2022 1111   Sepsis Labs Invalid input(s): PROCALCITONIN,  WBC,  LACTICIDVEN Microbiology Recent Results (from the past 240 hour(s))  Resp Panel by RT-PCR (Flu A&B, Covid) Nasopharyngeal Swab     Status: None   Collection Time: 02/09/22 11:42 AM   Specimen: Nasopharyngeal Swab; Nasopharyngeal(NP) swabs in vial transport medium  Result Value Ref Range Status   SARS Coronavirus 2 by RT PCR NEGATIVE NEGATIVE Final    Comment: (NOTE) SARS-CoV-2 target nucleic acids are NOT DETECTED.  The SARS-CoV-2 RNA is generally detectable in upper  respiratory specimens during the acute phase of infection. The lowest concentration of SARS-CoV-2 viral copies this assay can detect is 138 copies/mL. A negative result does not preclude SARS-Cov-2 infection and should not be used as the sole basis for treatment or other patient management decisions. A negative result may occur with  improper specimen collection/handling, submission of specimen other than nasopharyngeal swab, presence of viral mutation(s) within the areas targeted by this assay, and inadequate number of viral copies(<138 copies/mL). A negative result must be combined with clinical observations, patient history, and epidemiological information. The expected result is Negative.  Fact Sheet for Patients:  BloggerCourse.com  Fact Sheet for Healthcare Providers:  SeriousBroker.it  This test is no t yet approved or cleared by the [place] FDA and  has been authorized for detection and/or diagnosis of SARS-CoV-2 by FDA under an Emergency Use Authorization (EUA). This EUA will remain  in effect (meaning this test can be used) for the duration of the COVID-19 declaration under Section 564(b)(1) of the Act, 21 U.S.C.section 360bbb-3(b)(1), unless the authorization is terminated  or revoked sooner.       Influenza A by PCR NEGATIVE NEGATIVE Final   Influenza B by PCR NEGATIVE NEGATIVE Final    Comment: (NOTE) The Xpert Xpress SARS-CoV-2/FLU/RSV plus assay is intended as an aid in the diagnosis of influenza from Nasopharyngeal swab specimens and should not be used as a sole basis for treatment. Nasal washings and aspirates are unacceptable for Xpert Xpress SARS-CoV-2/FLU/RSV testing.  Fact Sheet for  Patients: BloggerCourse.com  Fact Sheet for Healthcare Providers: SeriousBroker.it  This test is not yet approved or cleared by the [place] FDA and has been  authorized for detection and/or diagnosis of SARS-CoV-2 by FDA under an Emergency Use Authorization (EUA). This EUA will remain in effect (meaning this test can be used) for the duration of the COVID-19 declaration under Section 564(b)(1) of the Act, 21 U.S.C. section 360bbb-3(b)(1), unless the authorization is terminated or revoked.  Performed at [facility], [address], [place], [place] [address]      Time coordinating discharge: 25 minutes  SIGNED: Lanae Boast, MD  [facility] 02/12/2022, 10:57 AM  If 7PM-7AM, please contact night-coverage [email or web address]

## 2022-02-12 NOTE — Plan of Care (Signed)
  Problem: Health Behavior/Discharge Planning: Goal: Ability to manage health-related needs will improve Outcome: Progressing   Problem: Clinical Measurements: Goal: Ability to maintain clinical measurements within normal limits will improve Outcome: Progressing Goal: Diagnostic test results will improve Outcome: Progressing   Problem: Activity: Goal: Risk for activity intolerance will decrease Outcome: Progressing   

## 2022-02-12 NOTE — Plan of Care (Signed)
  Problem: Safety: Goal: Ability to remain free from injury will improve Outcome: Progressing   Problem: Skin Integrity: Goal: Risk for impaired skin integrity will decrease Outcome: Progressing   

## 2022-02-12 NOTE — Progress Notes (Signed)
Provided and discussed discharge instructions. Addressed all questions and concerns. Iv removed intact. Danessa Mensch N Christian Treadway  

## 2022-02-13 ENCOUNTER — Telehealth: Payer: Self-pay | Admitting: Family Medicine

## 2022-02-13 ENCOUNTER — Telehealth: Payer: Self-pay

## 2022-02-13 NOTE — Telephone Encounter (Signed)
Transition Care Management Follow-up Telephone Call ?Date of discharge and from where: 3/12/[address]  ?How have you been since you were released from the hospital? Pt stated she is doing fine.  ?Any questions or concerns? No ? ?Items Reviewed: ?Did the pt receive and understand the discharge instructions provided? Yes  ?Medications obtained and verified? Yes  ?Other? No  ?Any new allergies since your discharge? No  ?Dietary orders reviewed? No ?Do you have support at home? Yes  ? ?Home Care and Equipment/Supplies: ?Were home health services ordered? not applicable ?If so, what is the name of the agency? N/A  ?Has the agency set up a time to come to the patient's home? not applicable ?Were any new equipment or medical supplies ordered?  No ?What is the name of the medical supply agency? N/A ?Were you able to get the supplies/equipment? not applicable ?Do you have any questions related to the use of the equipment or supplies? No ? ?Functional Questionnaire: (I = Independent and D = Dependent) ?ADLs: I ? ?Bathing/Dressing- I ? ?Meal Prep- I ? ?Eating- I ? ?Maintaining continence- I ? ?Transferring/Ambulation- I ? ?Managing Meds- I ? ?Follow up appointments reviewed: ? ?PCP Hospital f/u appt confirmed? No   ?Specialist Hospital f/u appt confirmed? No   ?Are transportation arrangements needed? No  ?If their condition worsens, is the pt aware to call PCP or go to the Emergency Dept.? Yes ?Was the patient provided with contact information for the PCP's office or ED? Yes ?Was to pt encouraged to call back with questions or concerns? Yes  ?

## 2022-02-13 NOTE — Telephone Encounter (Signed)
Spoke with patient and advised to continue to hold medication until OV. Patient voiced understanding. Nothing further needed at this time.  ? ?

## 2022-02-13 NOTE — Telephone Encounter (Signed)
Patient called to follow up on recent hospitalization; states hospital provider told her while being discharged to discontinue taking spironolactone (ALDACTONE) tablet 25 mg   [id number] ? ?Patient wants to know if she should stop taking it permanently or temporarily.  Please advise at [phone]. ?

## 2022-02-17 ENCOUNTER — Ambulatory Visit (INDEPENDENT_AMBULATORY_CARE_PROVIDER_SITE_OTHER): Payer: BC Managed Care – PPO | Admitting: Family Medicine

## 2022-02-17 ENCOUNTER — Telehealth: Payer: Self-pay

## 2022-02-17 ENCOUNTER — Encounter: Payer: Self-pay | Admitting: Family Medicine

## 2022-02-17 ENCOUNTER — Other Ambulatory Visit: Payer: Self-pay

## 2022-02-17 DIAGNOSIS — M6281 Muscle weakness (generalized): Secondary | ICD-10-CM

## 2022-02-17 DIAGNOSIS — E876 Hypokalemia: Secondary | ICD-10-CM

## 2022-02-17 DIAGNOSIS — G9340 Encephalopathy, unspecified: Secondary | ICD-10-CM

## 2022-02-17 MED ORDER — POTASSIUM CHLORIDE CRYS ER 20 MEQ PO TBCR: 20.0000 meq | EXTENDED_RELEASE_TABLET | Freq: Every day | ORAL | 11 refills | Status: DC

## 2022-02-17 MED ORDER — ESCITALOPRAM OXALATE 10 MG PO TABS: 10.0000 mg | ORAL_TABLET | Freq: Every day | ORAL | 3 refills | Status: DC

## 2022-02-17 NOTE — Telephone Encounter (Signed)
Pharmacy faxed refill request for  ? ?escitalopram (LEXAPRO) 10 MG tablet [[id number]]  ?  Order Details ?Dose, Route, Frequency: As Directed  ?Dispense Quantity: 90 tablet Refills: 4   ?     ?Sig: TAKE 1 TABLET BY MOUTH EVERY DAY  ?Patient taking differently: Take 10 mg by mouth at bedtime.  ?     ?Start Date: 01/17/21 End Date: --  ?Written Date: 01/17/21 Expiration Date: 01/17/22  ? ?

## 2022-02-17 NOTE — Progress Notes (Signed)
? ?Subjective:  ? ? Patient ID: Samantha Rosales, female    DOB: Oct 20, 1981, 41 y.o.   MRN: [id number] ? ?HPI  ?Patient is a very sweet 41 year old African-American female who is here today for hospital follow-up.  When I last saw the patient she was in a wheelchair.  She was unable to move her upper extremities or control her head.  She had diffuse global muscle weakness and was hallucinating.  Due to her history of alcohol abuse I was concerned about Warnicke's encephalopathy.  However, at the hospital she was found to have profound hypokalemia and hypomagnesemia.  She was repleted with potassium and magnesium and has resolved her muscle weakness has improved dramatically.  Today she has full control of her head, her upper extremities, and her lower extremities.  She is now eating.  She is keeping down food.  She is on 20 mill equivalents a day of potassium and 400 mg a day of magnesium oxide.  They did discontinue spironolactone in the hospital.  The patient attributes the drop to the fact that she was not eating solid food.  She was primarily drinking seltzer beer.  Since discharge from the hospital she is no longer drinking regular diet. ? ?Past Medical History:  ?Diagnosis Date  ? Abnormal Pap smear   ? Alcohol use   ? Allergic rhinitis   ? BV (bacterial vaginosis)   ? GERD (gastroesophageal reflux disease)   ? Hepatitis   ? LFT's >1000 (2019) due to methotrexate vs alcohol  ? High cholesterol   ? HSV-2 (herpes simplex virus 2) infection   ? Hypertension   ? Hypothyroidism 10/17/2019  ? Pancreatitis   ? 12/19 (thought to be due to alcohol)  ? Rheumatoid arthritis (HCC)   ? Seizures (HCC)   ? dx as child with epilepsy but no seizures since 28 and on no meds  ? Vaginal Pap smear, abnormal   ? ?Past Surgical History:  ?Procedure Laterality Date  ? CERVICAL CONIZATION W/BX  12/20/2012  ? Procedure: CONIZATION CERVIX WITH BIOPSY;  Surgeon: Lazaro Arms, MD;  Location: [facility];  Service: Gynecology;  Laterality: N/A;   ? HOLMIUM LASER APPLICATION  12/20/2012  ? Procedure: HOLMIUM LASER APPLICATION;  Surgeon: Lazaro Arms, MD;  Location: [facility];  Service: Gynecology;  Laterality: N/A;  ? ?Current Outpatient Medications on File Prior to Visit  ?Medication Sig Dispense Refill  ? Biotin 1000 MCG tablet Take 1,000 mcg by mouth daily.    ? calcium carbonate (TUMS - DOSED IN MG ELEMENTAL CALCIUM) 500 MG chewable tablet Chew 1 tablet (200 mg of elemental calcium total) by mouth 2 (two) times daily with a meal. 60 tablet 0  ? Cholecalciferol (VITAMIN D3) 50 MCG (2000 UT) CAPS Take 4,000 Units by mouth daily.    ? cycloSPORINE (RESTASIS) 0.05 % ophthalmic emulsion Place 1 drop into both eyes 2 (two) times daily.    ? feeding supplement (ENSURE ENLIVE / ENSURE PLUS) LIQD Take 237 mLs by mouth 2 (two) times daily between meals for 7 days. 3318 mL 0  ? fluticasone (FLONASE) 50 MCG/ACT nasal spray USE TWO SPRAY(S) IN EACH NOSTRIL ONCE DAILY (Patient taking differently: 1 spray daily as needed for allergies.) 16 g 11  ? folic acid (FOLVITE) 1 MG tablet Take 1 mg by mouth daily.    ? HUMIRA PEN 40 MG/0.4ML PNKT Inject 40 mg into the skin every 14 (fourteen) days.     ? ibuprofen (ADVIL) 600 MG tablet  Take 1 tablet (600 mg total) by mouth every 6 (six) hours as needed. (Patient taking differently: Take 1,200 mg by mouth every 6 (six) hours as needed for mild pain.) 30 tablet 0  ? magnesium oxide (MAG-OX) 400 (240 Mg) MG tablet Take 1 tablet (400 mg total) by mouth 2 (two) times daily for 7 days. (Patient taking differently: Take 400 mg by mouth daily.) 14 tablet 0  ? nebivolol (BYSTOLIC) 10 MG tablet Take 1 tablet (10 mg total) by mouth daily. 90 tablet 3  ? thiamine 100 MG tablet Take 100 mg by mouth 2 (two) times daily.    ? zinc gluconate 50 MG tablet Take 50 mg by mouth daily.    ? Icosapent Ethyl (VASCEPA) 1 g CAPS Take 2 capsules (2 g total) by mouth 2 (two) times daily. (Patient not taking: Reported on 02/09/2022) 120 capsule 11  ? ?No  current facility-administered medications on file prior to visit.  ? ?Allergies  ?Allergen Reactions  ? Penicillins Hives  ?  Has patient had a PCN reaction causing immediate rash, facial/tongue/throat swelling, SOB or lightheadedness with hypotension: Unknown ?Has patient had a PCN reaction causing severe rash involving mucus membranes or skin necrosis: Unknown ?Has patient had a PCN reaction that required hospitalization: Unknown ?Has patient had a PCN reaction occurring within the last 10 years: No ?If all of the above answers are "NO", then may proceed with Cephalosporin use. ?  ? ?Social History  ? ?Socioeconomic History  ? Marital status: Married  ?  Spouse name: Not on file  ? Number of children: 2  ? Years of education: Not on file  ? Highest education level: Not on file  ?Occupational History  ? 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  Employer: [employer]   ?Tobacco Use  ? Smoking status: Every Day  ?  Packs/day: 0.50  ?  Years: 15.00  ?  Pack years: 7.50  ?  Types: Cigarettes  ? Smokeless tobacco: Never  ?Vaping Use  ? Vaping Use: Never used  ?Substance and Sexual Activity  ? Alcohol use: Yes  ?  Alcohol/week: 28.0 standard drinks  ?  Types: 28 Standard drinks or equivalent per week  ?  Comment: 4-6 drinks per day depending what theyre doing.  ? Drug use: No  ? Sexual activity: Yes  ?  Birth control/protection: Injection  ?  Comment: Depo Provera every 3 months  ?Other Topics Concern  ? Not on file  ?Social History Narrative  ? Not on file  ? ?Social Determinants of Health  ? ?Financial Resource Strain: Not on file  ?Food Insecurity: Not on file  ?Transportation Needs: Not on file  ?Physical Activity: Not on file  ?Stress: Not on file  ?Social Connections: Not on file  ?Intimate Partner Violence: Not on file  ? ?Family History  ?Problem Relation Age of Onset  ? Hypertension Mother   ? Hypertension Father   ? Pancreatic cancer Paternal Uncle   ? Colon cancer Neg Hx   ? Esophageal cancer Neg Hx    ? Inflammatory bowel disease Neg Hx   ? Liver disease Neg Hx   ? Rectal cancer Neg Hx   ? Stomach cancer Neg Hx   ? ? ? ? ?Review of Systems ? ?   ?Objective:  ? Physical Exam ?Constitutional:   ?   General: She is not in acute distress. ?   Appearance: Normal appearance. She is normal weight. She is not ill-appearing, toxic-appearing or  diaphoretic.  ?Cardiovascular:  ?   Rate and Rhythm: Regular rhythm. Tachycardia present.  ?   Heart sounds: Normal heart sounds. No murmur heard. ?  No gallop.  ?Pulmonary:  ?   Effort: Pulmonary effort is normal. No respiratory distress.  ?   Breath sounds: Normal breath sounds. No stridor. No wheezing, rhonchi or rales.  ?Abdominal:  ?   General: Bowel sounds are normal. There is no distension.  ?   Palpations: Abdomen is soft.  ?   Tenderness: There is no abdominal tenderness. There is no guarding.  ?Musculoskeletal:  ?   Cervical back: Neck supple. No tenderness.  ?Skin: ?   Findings: No rash.  ?Neurological:  ?   General: No focal deficit present.  ?   Mental Status: She is alert and oriented to person, place, and time.  ?   Cranial Nerves: No cranial nerve deficit.  ?   Sensory: No sensory deficit.  ?   Motor: No weakness.  ?   Coordination: Coordination normal.  ?   Gait: Gait normal.  ?Psychiatric:     ?   Attention and Perception: Attention normal. She perceives visual hallucinations.     ?   Mood and Affect: Mood normal.     ?   Speech: Speech normal.  ? ? ? ? ? ?   ?Assessment & Plan:  ?Hypomagnesemia - Plan: Magnesium, CBC with Differential/Platelet, COMPLETE METABOLIC PANEL WITH GFR ? ?Low blood potassium - Plan: potassium chloride SA (KLOR-CON M) 20 MEQ tablet, Magnesium, CBC with Differential/Platelet, COMPLETE METABOLIC PANEL WITH GFR ? ?Muscle weakness ? ?Hypocalcemia ? ?Encephalopathy ?Thankfully, the patient's encephalopathy and muscle weakness has resolved after repletion of her potassium and magnesium.  I had a long discussion today with the patient about her  alcohol abuse.  I strongly recommended an outpatient program such as Alcoholics Anonymous.  The patient states that she does not need a treatment program.  She states that she has abstained from alcohol s

## 2022-02-17 NOTE — Telephone Encounter (Signed)
Rx sent to pharmacy   

## 2022-02-18 LAB — CBC WITH DIFFERENTIAL/PLATELET
Absolute Monocytes: 625 cells/uL (ref 200–950)
Basophils Absolute: 78 cells/uL (ref 0–200)
Basophils Relative: 1.1 %
Eosinophils Absolute: 43 cells/uL (ref 15–500)
Eosinophils Relative: 0.6 %
HCT: 29.9 % — ABNORMAL LOW (ref 35.0–45.0)
Hemoglobin: 10 g/dL — ABNORMAL LOW (ref 11.7–15.5)
Lymphs Abs: 1967 cells/uL (ref 850–3900)
MCH: 32.5 pg (ref 27.0–33.0)
MCHC: 33.4 g/dL (ref 32.0–36.0)
MCV: 97.1 fL (ref 80.0–100.0)
MPV: 11.7 fL (ref 7.5–12.5)
Monocytes Relative: 8.8 %
Neutro Abs: 4388 cells/uL (ref 1500–7800)
Neutrophils Relative %: 61.8 %
Platelets: 374 10*3/uL (ref 140–400)
RBC: 3.08 10*6/uL — ABNORMAL LOW (ref 3.80–5.10)
RDW: 12.3 % (ref 11.0–15.0)
Total Lymphocyte: 27.7 %
WBC: 7.1 10*3/uL (ref 3.8–10.8)

## 2022-02-18 LAB — COMPLETE METABOLIC PANEL WITH GFR
AG Ratio: 1.1 (calc) (ref 1.0–2.5)
ALT: 26 U/L (ref 6–29)
AST: 28 U/L (ref 10–30)
Albumin: 3.9 g/dL (ref 3.6–5.1)
Alkaline phosphatase (APISO): 105 U/L (ref 31–125)
BUN: 11 mg/dL (ref 7–25)
CO2: 25 mmol/L (ref 20–32)
Calcium: 9.9 mg/dL (ref 8.6–10.2)
Chloride: 104 mmol/L (ref 98–110)
Creat: 0.73 mg/dL (ref 0.50–0.99)
Globulin: 3.7 g/dL (calc) (ref 1.9–3.7)
Glucose, Bld: 98 mg/dL (ref 65–99)
Potassium: 6.4 mmol/L (ref 3.5–5.3)
Sodium: 139 mmol/L (ref 135–146)
Total Bilirubin: 0.4 mg/dL (ref 0.2–1.2)
Total Protein: 7.6 g/dL (ref 6.1–8.1)
eGFR: 106 mL/min/{1.73_m2} (ref >=60)

## 2022-02-18 LAB — MAGNESIUM: Magnesium: 2.1 mg/dL (ref 1.5–2.5)

## 2022-02-21 ENCOUNTER — Other Ambulatory Visit: Payer: Self-pay

## 2022-02-21 ENCOUNTER — Other Ambulatory Visit: Payer: BC Managed Care – PPO

## 2022-02-21 DIAGNOSIS — E876 Hypokalemia: Secondary | ICD-10-CM

## 2022-02-22 LAB — POTASSIUM: Potassium: 5.5 mmol/L — ABNORMAL HIGH (ref 3.5–5.3)

## 2022-02-23 ENCOUNTER — Other Ambulatory Visit: Payer: Self-pay

## 2022-02-23 ENCOUNTER — Other Ambulatory Visit: Payer: Self-pay | Admitting: Family Medicine

## 2022-02-23 DIAGNOSIS — E875 Hyperkalemia: Secondary | ICD-10-CM

## 2022-02-23 MED ORDER — LOKELMA 5 G PO PACK: 5.0000 g | PACK | Freq: Once | ORAL | 0 refills | Status: AC

## 2022-02-27 ENCOUNTER — Ambulatory Visit (INDEPENDENT_AMBULATORY_CARE_PROVIDER_SITE_OTHER): Payer: BC Managed Care – PPO | Admitting: Orthopedic Surgery

## 2022-02-27 ENCOUNTER — Other Ambulatory Visit: Payer: Self-pay

## 2022-02-27 ENCOUNTER — Ambulatory Visit (INDEPENDENT_AMBULATORY_CARE_PROVIDER_SITE_OTHER): Payer: BC Managed Care – PPO

## 2022-02-27 DIAGNOSIS — S42342D Displaced spiral fracture of shaft of humerus, left arm, subsequent encounter for fracture with routine healing: Secondary | ICD-10-CM

## 2022-02-27 NOTE — Progress Notes (Signed)
FOLLOW UP  ? ?Encounter Diagnosis  ?Name Primary?  ? Closed displaced spiral fracture of shaft of left humerus with routine healing, subsequent encounter Yes  ? ? ? ?Chief Complaint  ?Patient presents with  ? fracture care  ?  LEFT/ closed displaced spiral fracture of shaft of LEFT humerus ?DOI 09/07/21  ? ? ? ?Ms. Herbon had a left proximal humerus fracture September 07, 2021 did well in a brace has remained neurovascularly intact she is here for her 89-month post injury x-ray ? ?She has no complaints her radial nerve is intact her x-ray shows fracture healing her exam shows full range of motion no pain at the fracture site ? ?We released her to normal activity ?

## 2022-03-03 ENCOUNTER — Other Ambulatory Visit: Payer: BC Managed Care – PPO

## 2022-03-04 DIAGNOSIS — Z419 Encounter for procedure for purposes other than remedying health state, unspecified: Secondary | ICD-10-CM | POA: Diagnosis not present

## 2022-04-03 DIAGNOSIS — Z419 Encounter for procedure for purposes other than remedying health state, unspecified: Secondary | ICD-10-CM | POA: Diagnosis not present

## 2022-04-20 ENCOUNTER — Other Ambulatory Visit: Payer: BC Managed Care – PPO

## 2022-04-20 DIAGNOSIS — E875 Hyperkalemia: Secondary | ICD-10-CM

## 2022-04-21 ENCOUNTER — Other Ambulatory Visit: Payer: Self-pay | Admitting: Family Medicine

## 2022-04-21 ENCOUNTER — Other Ambulatory Visit: Payer: Self-pay

## 2022-04-21 DIAGNOSIS — E876 Hypokalemia: Secondary | ICD-10-CM

## 2022-04-21 LAB — POTASSIUM: Potassium: 2.8 mmol/L — ABNORMAL LOW (ref 3.5–5.3)

## 2022-04-21 MED ORDER — POTASSIUM CHLORIDE CRYS ER 20 MEQ PO TBCR: 40.0000 meq | EXTENDED_RELEASE_TABLET | Freq: Every day | ORAL | 11 refills | Status: DC

## 2022-05-04 ENCOUNTER — Telehealth: Payer: Self-pay

## 2022-05-04 DIAGNOSIS — Z419 Encounter for procedure for purposes other than remedying health state, unspecified: Secondary | ICD-10-CM | POA: Diagnosis not present

## 2022-05-04 NOTE — Telephone Encounter (Signed)
Pt returned call. Pt is aware of Rx being sent. Pt just now started taking Rx (Kdur)will recheck K+ labs on 05/15/22.

## 2022-05-04 NOTE — Telephone Encounter (Signed)
Returned pt call to let her know that her medication was sent [facility] on [address]. Let her detailed vm to advise her of this.

## 2022-05-15 ENCOUNTER — Other Ambulatory Visit: Payer: Self-pay

## 2022-06-03 DIAGNOSIS — Z419 Encounter for procedure for purposes other than remedying health state, unspecified: Secondary | ICD-10-CM | POA: Diagnosis not present

## 2022-06-20 ENCOUNTER — Observation Stay (HOSPITAL_COMMUNITY)
Admission: EM | Admit: 2022-06-20 | Discharge: 2022-06-23 | Disposition: A | Discharge status: Home / Self Care | Payer: BC Managed Care – PPO | Attending: Internal Medicine | Admitting: Internal Medicine

## 2022-06-20 ENCOUNTER — Emergency Department (HOSPITAL_COMMUNITY): Payer: BC Managed Care – PPO

## 2022-06-20 ENCOUNTER — Other Ambulatory Visit: Payer: Self-pay

## 2022-06-20 ENCOUNTER — Encounter (HOSPITAL_COMMUNITY): Payer: Self-pay | Admitting: Emergency Medicine

## 2022-06-20 DIAGNOSIS — F17210 Nicotine dependence, cigarettes, uncomplicated: Secondary | ICD-10-CM | POA: Diagnosis not present

## 2022-06-20 DIAGNOSIS — D696 Thrombocytopenia, unspecified: Secondary | ICD-10-CM | POA: Insufficient documentation

## 2022-06-20 DIAGNOSIS — R0602 Shortness of breath: Secondary | ICD-10-CM | POA: Diagnosis present

## 2022-06-20 DIAGNOSIS — R7989 Other specified abnormal findings of blood chemistry: Secondary | ICD-10-CM | POA: Diagnosis not present

## 2022-06-20 DIAGNOSIS — Z79899 Other long term (current) drug therapy: Secondary | ICD-10-CM | POA: Diagnosis not present

## 2022-06-20 DIAGNOSIS — E43 Unspecified severe protein-calorie malnutrition: Secondary | ICD-10-CM | POA: Diagnosis not present

## 2022-06-20 DIAGNOSIS — E8729 Other acidosis: Secondary | ICD-10-CM

## 2022-06-20 DIAGNOSIS — E872 Acidosis, unspecified: Principal | ICD-10-CM

## 2022-06-20 DIAGNOSIS — R739 Hyperglycemia, unspecified: Secondary | ICD-10-CM | POA: Diagnosis not present

## 2022-06-20 DIAGNOSIS — E039 Hypothyroidism, unspecified: Secondary | ICD-10-CM | POA: Diagnosis not present

## 2022-06-20 DIAGNOSIS — I10 Essential (primary) hypertension: Secondary | ICD-10-CM | POA: Diagnosis present

## 2022-06-20 DIAGNOSIS — Z794 Long term (current) use of insulin: Secondary | ICD-10-CM | POA: Insufficient documentation

## 2022-06-20 DIAGNOSIS — F1010 Alcohol abuse, uncomplicated: Secondary | ICD-10-CM | POA: Diagnosis present

## 2022-06-20 DIAGNOSIS — E876 Hypokalemia: Secondary | ICD-10-CM | POA: Diagnosis present

## 2022-06-20 LAB — CBC WITH DIFFERENTIAL/PLATELET
Abs Immature Granulocytes: 0.03 10*3/uL (ref 0.00–0.07)
Basophils Absolute: 0 10*3/uL (ref 0.0–0.1)
Basophils Relative: 0 %
Eosinophils Absolute: 0 10*3/uL (ref 0.0–0.5)
Eosinophils Relative: 0 %
HCT: 39 % (ref 36.0–46.0)
Hemoglobin: 12.4 g/dL (ref 12.0–15.0)
Immature Granulocytes: 1 %
Lymphocytes Relative: 15 %
Lymphs Abs: 0.9 10*3/uL (ref 0.7–4.0)
MCH: 34 pg (ref 26.0–34.0)
MCHC: 31.8 g/dL (ref 30.0–36.0)
MCV: 106.8 fL — ABNORMAL HIGH (ref 80.0–100.0)
Monocytes Absolute: 0.4 10*3/uL (ref 0.1–1.0)
Monocytes Relative: 7 %
Neutro Abs: 4.8 10*3/uL (ref 1.7–7.7)
Neutrophils Relative %: 77 %
Platelets: 76 10*3/uL — ABNORMAL LOW (ref 150–400)
RBC: 3.65 MIL/uL — ABNORMAL LOW (ref 3.87–5.11)
RDW: 13.8 % (ref 11.5–15.5)
WBC: 6.2 10*3/uL (ref 4.0–10.5)
nRBC: 0 % (ref 0.0–0.2)

## 2022-06-20 LAB — BASIC METABOLIC PANEL
Anion gap: 20 — ABNORMAL HIGH (ref 5–15)
BUN: <5 mg/dL — ABNORMAL LOW (ref 6–20)
BUN: 5 mg/dL — ABNORMAL LOW (ref 6–20)
CO2: <7 mmol/L — ABNORMAL LOW (ref 22–32)
CO2: 9 mmol/L — ABNORMAL LOW (ref 22–32)
Calcium: 6.4 mg/dL — CL (ref 8.9–10.3)
Calcium: 8.1 mg/dL — ABNORMAL LOW (ref 8.9–10.3)
Chloride: 111 mmol/L (ref 98–111)
Chloride: 108 mmol/L (ref 98–111)
Creatinine, Ser: 0.78 mg/dL (ref 0.44–1.00)
Creatinine, Ser: 0.79 mg/dL (ref 0.44–1.00)
GFR, Estimated: >60 mL/min (ref >60)
GFR, Estimated: >60 mL/min (ref >60)
Glucose, Bld: 247 mg/dL — ABNORMAL HIGH (ref 70–99)
Glucose, Bld: 263 mg/dL — ABNORMAL HIGH (ref 70–99)
Potassium: 4.1 mmol/L (ref 3.5–5.1)
Potassium: 3.4 mmol/L — ABNORMAL LOW (ref 3.5–5.1)
Sodium: 138 mmol/L (ref 135–145)
Sodium: 137 mmol/L (ref 135–145)

## 2022-06-20 LAB — COMPREHENSIVE METABOLIC PANEL
ALT: 64 U/L — ABNORMAL HIGH (ref 0–44)
AST: 232 U/L — ABNORMAL HIGH (ref 15–41)
Albumin: 4.2 g/dL (ref 3.5–5.0)
Alkaline Phosphatase: 185 U/L — ABNORMAL HIGH (ref 38–126)
BUN: 6 mg/dL (ref 6–20)
CO2: <7 mmol/L — ABNORMAL LOW (ref 22–32)
Calcium: 8.4 mg/dL — ABNORMAL LOW (ref 8.9–10.3)
Chloride: 95 mmol/L — ABNORMAL LOW (ref 98–111)
Creatinine, Ser: 1.02 mg/dL — ABNORMAL HIGH (ref 0.44–1.00)
GFR, Estimated: >60 mL/min (ref >60)
Glucose, Bld: 123 mg/dL — ABNORMAL HIGH (ref 70–99)
Potassium: 3.1 mmol/L — ABNORMAL LOW (ref 3.5–5.1)
Sodium: 139 mmol/L (ref 135–145)
Total Bilirubin: 3.8 mg/dL — ABNORMAL HIGH (ref 0.3–1.2)
Total Protein: 8.9 g/dL — ABNORMAL HIGH (ref 6.5–8.1)

## 2022-06-20 LAB — LACTIC ACID, PLASMA: Lactic Acid, Venous: 1.9 mmol/L (ref 0.5–1.9)

## 2022-06-20 LAB — BLOOD GAS, VENOUS
Acid-base deficit: 19.8 mmol/L — ABNORMAL HIGH (ref 0.0–2.0)
Acid-base deficit: 21.1 mmol/L — ABNORMAL HIGH (ref 0.0–2.0)
Bicarbonate: 7.7 mmol/L — ABNORMAL LOW (ref 20.0–28.0)
Bicarbonate: 6.7 mmol/L — ABNORMAL LOW (ref 20.0–28.0)
O2 Saturation: 38.7 %
O2 Saturation: 58.8 %
Patient temperature: 37
Patient temperature: 36.4
pCO2, Ven: 23 mmHg — ABNORMAL LOW (ref 44–60)
pCO2, Ven: 20 mmHg — ABNORMAL LOW (ref 44–60)
pH, Ven: 7.13 — CL (ref 7.25–7.43)
pH, Ven: 7.12 — CL (ref 7.25–7.43)
pO2, Ven: 35 mmHg (ref 32–45)
pO2, Ven: 41 mmHg (ref 32–45)

## 2022-06-20 LAB — URINALYSIS, ROUTINE W REFLEX MICROSCOPIC
Bilirubin Urine: NEGATIVE
Glucose, UA: NEGATIVE mg/dL
Ketones, ur: 80 mg/dL — AB
Leukocytes,Ua: NEGATIVE
Nitrite: NEGATIVE
Protein, ur: 100 mg/dL — AB
Specific Gravity, Urine: 1.012 (ref 1.005–1.030)
pH: 6 (ref 5.0–8.0)

## 2022-06-20 LAB — GLUCOSE, CAPILLARY
Glucose-Capillary: 206 mg/dL — ABNORMAL HIGH (ref 70–99)
Glucose-Capillary: 236 mg/dL — ABNORMAL HIGH (ref 70–99)
Glucose-Capillary: 238 mg/dL — ABNORMAL HIGH (ref 70–99)
Glucose-Capillary: 260 mg/dL — ABNORMAL HIGH (ref 70–99)
Glucose-Capillary: 268 mg/dL — ABNORMAL HIGH (ref 70–99)
Glucose-Capillary: 271 mg/dL — ABNORMAL HIGH (ref 70–99)
Glucose-Capillary: 294 mg/dL — ABNORMAL HIGH (ref 70–99)

## 2022-06-20 LAB — ETHANOL: Alcohol, Ethyl (B): <10 mg/dL (ref <10)

## 2022-06-20 LAB — MRSA NEXT GEN BY PCR, NASAL: MRSA by PCR Next Gen: NOT DETECTED

## 2022-06-20 LAB — PROTIME-INR
INR: 1.1 (ref 0.8–1.2)
Prothrombin Time: 13.7 seconds (ref 11.4–15.2)

## 2022-06-20 LAB — BETA-HYDROXYBUTYRIC ACID
Beta-Hydroxybutyric Acid: >8 mmol/L — ABNORMAL HIGH (ref 0.05–0.27)
Beta-Hydroxybutyric Acid: >8 mmol/L — ABNORMAL HIGH (ref 0.05–0.27)

## 2022-06-20 LAB — SALICYLATE LEVEL: Salicylate Lvl: <7 mg/dL — ABNORMAL LOW (ref 7.0–30.0)

## 2022-06-20 LAB — OSMOLALITY: Osmolality: 320 mOsm/kg — ABNORMAL HIGH (ref 275–295)

## 2022-06-20 LAB — MAGNESIUM: Magnesium: 1.7 mg/dL (ref 1.7–2.4)

## 2022-06-20 MED ORDER — LORAZEPAM 2 MG/ML IJ SOLN: 1.0000 mg | INTRAMUSCULAR | Status: DC | PRN

## 2022-06-20 MED ORDER — CALCIUM GLUCONATE-NACL 1-0.675 GM/50ML-% IV SOLN
1.0000 g | Freq: Once | INTRAVENOUS | Status: AC
Start: 2022-06-20 — End: 2022-06-20
  Administered 2022-06-20: 1000 mg via INTRAVENOUS
  Filled 2022-06-20: qty 50

## 2022-06-20 MED ORDER — LORAZEPAM 1 MG PO TABS
0.0000 mg | ORAL_TABLET | ORAL | Status: DC
  Administered 2022-06-20 (×2): 1 mg via ORAL
  Filled 2022-06-20 (×2): qty 1

## 2022-06-20 MED ORDER — ONDANSETRON HCL 4 MG PO TABS: 4.0000 mg | ORAL_TABLET | Freq: Four times a day (QID) | ORAL | Status: DC | PRN

## 2022-06-20 MED ORDER — LORAZEPAM 1 MG PO TABS: 1.0000 mg | ORAL_TABLET | ORAL | Status: DC | PRN

## 2022-06-20 MED ORDER — FOLIC ACID 1 MG PO TABS
1.0000 mg | ORAL_TABLET | Freq: Every day | ORAL | Status: DC
  Administered 2022-06-20 – 2022-06-23 (×3): 1 mg via ORAL
  Filled 2022-06-20 (×4): qty 1

## 2022-06-20 MED ORDER — INSULIN ASPART 100 UNIT/ML IJ SOLN
6.0000 [IU] | Freq: Once | INTRAMUSCULAR | Status: AC
  Administered 2022-06-20: 6 [IU] via SUBCUTANEOUS

## 2022-06-20 MED ORDER — PANTOPRAZOLE SODIUM 40 MG IV SOLR
40.0000 mg | INTRAVENOUS | Status: DC
Start: 2022-06-20 — End: 2022-06-22
  Administered 2022-06-20 – 2022-06-21 (×2): 40 mg via INTRAVENOUS
  Filled 2022-06-20 (×2): qty 10

## 2022-06-20 MED ORDER — ADULT MULTIVITAMIN W/MINERALS CH
1.0000 | ORAL_TABLET | Freq: Every day | ORAL | Status: DC
  Administered 2022-06-20 – 2022-06-23 (×3): 1 via ORAL
  Filled 2022-06-20 (×4): qty 1

## 2022-06-20 MED ORDER — ONDANSETRON HCL 4 MG/2ML IJ SOLN
4.0000 mg | Freq: Four times a day (QID) | INTRAMUSCULAR | Status: DC | PRN
  Administered 2022-06-20: 4 mg via INTRAVENOUS
  Filled 2022-06-20: qty 2

## 2022-06-20 MED ORDER — LACTATED RINGERS IV SOLN: INTRAVENOUS | Status: DC

## 2022-06-20 MED ORDER — DEXTROSE 10 % IV SOLN: INTRAVENOUS | Status: DC

## 2022-06-20 MED ORDER — MAGNESIUM SULFATE IN D5W 1-5 GM/100ML-% IV SOLN
1.0000 g | Freq: Once | INTRAVENOUS | Status: AC
Start: 2022-06-20 — End: 2022-06-20
  Administered 2022-06-20: 1 g via INTRAVENOUS
  Filled 2022-06-20: qty 100

## 2022-06-20 MED ORDER — LORAZEPAM 1 MG PO TABS: 0.0000 mg | ORAL_TABLET | Freq: Three times a day (TID) | ORAL | Status: DC

## 2022-06-20 MED ORDER — INSULIN (MYXREDLIN) INFUSION FOR HYPERTRIGLYCERIDEMIA
0.0300 [IU]/kg/h | INTRAVENOUS | Status: DC
  Administered 2022-06-20: 0.03 [IU]/kg/h via INTRAVENOUS

## 2022-06-20 MED ORDER — HYDROMORPHONE HCL 1 MG/ML IJ SOLN: 0.5000 mg | INTRAMUSCULAR | Status: DC | PRN

## 2022-06-20 MED ORDER — MAGNESIUM SULFATE IN D5W 1-5 GM/100ML-% IV SOLN
1.0000 g | Freq: Once | INTRAVENOUS | Status: AC
  Administered 2022-06-20: 1 g via INTRAVENOUS
  Filled 2022-06-20: qty 100

## 2022-06-20 MED ORDER — LORAZEPAM 2 MG/ML IJ SOLN: 0.0000 mg | Freq: Two times a day (BID) | INTRAMUSCULAR | Status: DC

## 2022-06-20 MED ORDER — DEXTROSE 50 % IV SOLN: 0.0000 mL | INTRAVENOUS | Status: DC | PRN

## 2022-06-20 MED ORDER — SODIUM CHLORIDE 0.9 % IV BOLUS (SEPSIS)
1000.0000 mL | Freq: Once | INTRAVENOUS | Status: AC
  Administered 2022-06-20: 1000 mL via INTRAVENOUS

## 2022-06-20 MED ORDER — THIAMINE HCL 100 MG PO TABS
100.0000 mg | ORAL_TABLET | Freq: Every day | ORAL | Status: DC
  Administered 2022-06-20 – 2022-06-23 (×3): 100 mg via ORAL
  Filled 2022-06-20 (×3): qty 1

## 2022-06-20 MED ORDER — LORAZEPAM 2 MG/ML IJ SOLN: 0.0000 mg | Freq: Four times a day (QID) | INTRAMUSCULAR | Status: AC

## 2022-06-20 MED ORDER — POTASSIUM CHLORIDE CRYS ER 20 MEQ PO TBCR: 40.0000 meq | EXTENDED_RELEASE_TABLET | Freq: Once | ORAL | Status: DC

## 2022-06-20 MED ORDER — CHLORHEXIDINE GLUCONATE CLOTH 2 % EX PADS
6.0000 | MEDICATED_PAD | Freq: Every day | CUTANEOUS | Status: DC
Start: 2022-06-20 — End: 2022-06-23
  Administered 2022-06-20 – 2022-06-23 (×4): 6 via TOPICAL

## 2022-06-20 MED ORDER — INSULIN REGULAR(HUMAN) IN NACL 100-0.9 UT/100ML-% IV SOLN
INTRAVENOUS | Status: DC
  Administered 2022-06-20: 2.8 [IU]/h via INTRAVENOUS
  Filled 2022-06-20: qty 100

## 2022-06-20 MED ORDER — POTASSIUM CHLORIDE 10 MEQ/100ML IV SOLN
10.0000 meq | INTRAVENOUS | Status: AC
  Administered 2022-06-20 – 2022-06-21 (×2): 10 meq via INTRAVENOUS
  Filled 2022-06-20 (×2): qty 100

## 2022-06-20 MED ORDER — POTASSIUM CHLORIDE IN NACL 20-0.9 MEQ/L-% IV SOLN
INTRAVENOUS | Status: AC
  Filled 2022-06-20 (×2): qty 1000

## 2022-06-20 MED ORDER — ORAL CARE MOUTH RINSE: 15.0000 mL | OROMUCOSAL | Status: DC | PRN

## 2022-06-20 MED ORDER — SODIUM CHLORIDE 0.9 % IV SOLN
1000.0000 mL | INTRAVENOUS | Status: DC
  Administered 2022-06-20: 1000 mL via INTRAVENOUS

## 2022-06-20 MED ORDER — ACETAMINOPHEN 325 MG PO TABS
650.0000 mg | ORAL_TABLET | Freq: Four times a day (QID) | ORAL | Status: DC | PRN
  Administered 2022-06-23: 650 mg via ORAL
  Filled 2022-06-20: qty 2

## 2022-06-20 MED ORDER — KCL IN DEXTROSE-NACL 20-5-0.9 MEQ/L-%-% IV SOLN
INTRAVENOUS | Status: DC
  Filled 2022-06-20: qty 1000

## 2022-06-20 MED ORDER — THIAMINE HCL 100 MG/ML IJ SOLN
100.0000 mg | Freq: Every day | INTRAMUSCULAR | Status: DC
  Administered 2022-06-21: 100 mg via INTRAVENOUS
  Filled 2022-06-20: qty 2

## 2022-06-20 MED ORDER — ACETAMINOPHEN 650 MG RE SUPP: 650.0000 mg | Freq: Four times a day (QID) | RECTAL | Status: DC | PRN

## 2022-06-20 MED ORDER — NEBIVOLOL HCL 10 MG PO TABS
10.0000 mg | ORAL_TABLET | Freq: Every day | ORAL | Status: DC
  Administered 2022-06-20 – 2022-06-23 (×4): 10 mg via ORAL
  Filled 2022-06-20 (×5): qty 1

## 2022-06-20 MED ORDER — DEXTROSE IN LACTATED RINGERS 5 % IV SOLN
INTRAVENOUS | Status: DC
Start: 2022-06-20 — End: 2022-06-21

## 2022-06-20 MED ORDER — NICOTINE 14 MG/24HR TD PT24
14.0000 mg | MEDICATED_PATCH | Freq: Every day | TRANSDERMAL | Status: DC
  Administered 2022-06-23: 14 mg via TRANSDERMAL
  Filled 2022-06-20 (×2): qty 1

## 2022-06-20 NOTE — Progress Notes (Signed)
Messaged MD about blood sugar of 260 on D10 drip, and persistent nausea/gagging with oral intake. MD raised concern over BMP that had not been drawn in the ED. Instructed to hold patient NPO and start on an insulin drip. BMP sent to lab, insulin drip has been started and D5LR for glucostabilizer.

## 2022-06-20 NOTE — Plan of Care (Signed)
  Problem: Education: Goal: Knowledge of General Education information will improve Description: Including pain rating scale, medication(s)/side effects and non-pharmacologic comfort measures Outcome: Progressing   Problem: Health Behavior/Discharge Planning: Goal: Ability to manage health-related needs will improve Outcome: Progressing   Problem: Clinical Measurements: Goal: Ability to maintain clinical measurements within normal limits will improve Outcome: Progressing Goal: Will remain free from infection Outcome: Progressing Goal: Diagnostic test results will improve Outcome: Progressing Goal: Respiratory complications will improve Outcome: Progressing Goal: Cardiovascular complication will be avoided Outcome: Progressing   Problem: Activity: Goal: Risk for activity intolerance will decrease Outcome: Progressing   Problem: Nutrition: Goal: Adequate nutrition will be maintained Outcome: Progressing   Problem: Coping: Goal: Level of anxiety will decrease Outcome: Progressing   Problem: Elimination: Goal: Will not experience complications related to bowel motility Outcome: Progressing Goal: Will not experience complications related to urinary retention Outcome: Progressing   Problem: Pain Managment: Goal: General experience of comfort will improve Outcome: Progressing   Problem: Safety: Goal: Ability to remain free from injury will improve Outcome: Progressing   Problem: Skin Integrity: Goal: Risk for impaired skin integrity will decrease Outcome: Progressing   Problem: Education: Goal: Knowledge of disease or condition will improve Outcome: Progressing Goal: Understanding of discharge needs will improve Outcome: Progressing   Problem: Physical Regulation: Goal: Complications related to the disease process, condition or treatment will be avoided or minimized Outcome: Progressing   Problem: Safety: Goal: Ability to remain free from injury will  improve Outcome: Progressing   Problem: Safety: Goal: Ability to remain free from injury will improve Outcome: Progressing

## 2022-06-20 NOTE — ED Provider Triage Note (Signed)
Emergency Medicine Provider Triage Evaluation Note  Samantha Rosales , a 41 y.o. female  was evaluated in triage.  Pt complains of shortness of breath x 3 days.  Decrease in p.o. intake of for the last 3 days, limited history due to poor historian.   Review of Systems  Positive: Shortness of breath, weakness Negative: Fever, nausea, vomiting  Physical Exam  BP (!) 137/93 (BP Location: Right Arm)   Pulse (!) 112   Temp 97.8 F (36.6 C) (Oral)   Resp (!) 29   SpO2 99%  Gen:   Awake, no distress   Resp:  Normal effort  MSK:   Moves extremities without difficulty  Other:  Ill appearing, irregular vitals signs  Medical Decision Making  Medically screening exam initiated at 9:17 AM.  Appropriate orders placed.  Samantha Rosales was informed that the remainder of the evaluation will be completed by another provider, this initial triage assessment does not replace that evaluation, and the importance of remaining in the ED until their evaluation is complete.  Lactic and cultures   Claude Manges, PA-C 06/20/22 3244

## 2022-06-20 NOTE — ED Provider Notes (Signed)
[name]-EMERGENCY DEPT Provider Note   CSN: [id number] Arrival date & time: 06/20/22  0831     History  Chief Complaint  Patient presents with   Shortness of Breath   Nausea    Samantha Rosales is a 41 y.o. female.   Shortness of Breath    Pt states she is feeling short of breath and faint.  Sx started last night.  Pt has been coughing and sneezing.  Rhinitis. No fevers.  No leg swelling.  No blood clots.  Pt had some abd cramping 2 days ago.  It was a sharp pain.  SHe hasnt had any of that today. She also feels weak over all.  She has some tingling in her legs and feet. Pt does drink alcohol, usually a beer a day.  She has not had any in the last three days.  She use to drink more.  Patient denies any toxic alcohol ingestion.  She denies drinking hand appetizer or antifreeze or methanol  Home Medications Prior to Admission medications   Medication Sig Start Date End Date Taking? Authorizing Provider  Biotin 1000 MCG tablet Take 1,000 mcg by mouth daily.    [provider]  Cholecalciferol (VITAMIN D3) 50 MCG (2000 UT) CAPS Take 4,000 Units by mouth daily.    [provider]  cycloSPORINE (RESTASIS) 0.05 % ophthalmic emulsion Place 1 drop into both eyes 2 (two) times daily. 12/31/20   [provider]  escitalopram (LEXAPRO) 10 MG tablet Take 1 tablet (10 mg total) by mouth daily. 02/17/22   Donita Brooks, MD  fluticasone (FLONASE) 50 MCG/ACT nasal spray USE TWO SPRAY(S) IN EACH NOSTRIL ONCE DAILY Patient taking differently: 1 spray daily as needed for allergies. 06/21/17   Donita Brooks, MD  folic acid (FOLVITE) 1 MG tablet Take 1 mg by mouth daily.    [provider]  HUMIRA PEN 40 MG/0.4ML PNKT Inject 40 mg into the skin every 14 (fourteen) days.  05/28/20   [provider]  ibuprofen (ADVIL) 600 MG tablet Take 1 tablet (600 mg total) by mouth every 6 (six) hours as needed. Patient taking differently:  Take 1,200 mg by mouth every 6 (six) hours as needed for mild pain. 05/03/21   Moshe Cipro, NP  Icosapent Ethyl (VASCEPA) 1 g CAPS Take 2 capsules (2 g total) by mouth 2 (two) times daily. 09/05/18   Donita Brooks, MD  nebivolol (BYSTOLIC) 10 MG tablet Take 1 tablet (10 mg total) by mouth daily. 04/05/21   Donita Brooks, MD  potassium chloride SA (KLOR-CON M) 20 MEQ tablet Take 2 tablets (40 mEq total) by mouth daily. 04/21/22 05/21/22  Donita Brooks, MD  thiamine 100 MG tablet Take 100 mg by mouth 2 (two) times daily. 04/18/21   [provider]  zinc gluconate 50 MG tablet Take 50 mg by mouth daily.    [provider]      Allergies    Penicillins    Review of Systems   Review of Systems  Respiratory:  Positive for shortness of breath.     Physical Exam Updated Vital Signs BP (!) 137/116   Pulse (!) 101   Temp 97.8 F (36.6 C) (Oral)   Resp (!) 24   SpO2 100%  Physical Exam Vitals and nursing note reviewed.  Constitutional:      Appearance: She is well-developed. She is ill-appearing.  HENT:     Head: Normocephalic and atraumatic.  Right Ear: External ear normal.     Left Ear: External ear normal.  Eyes:     General: No scleral icterus.       Right eye: No discharge.        Left eye: No discharge.     Conjunctiva/sclera: Conjunctivae normal.  Neck:     Trachea: No tracheal deviation.  Cardiovascular:     Rate and Rhythm: Normal rate and regular rhythm.  Pulmonary:     Effort: Pulmonary effort is normal. Tachypnea present. No respiratory distress.     Breath sounds: Normal breath sounds. No stridor. No wheezing or rales.  Abdominal:     General: Bowel sounds are normal. There is no distension.     Palpations: Abdomen is soft.     Tenderness: There is no abdominal tenderness. There is no guarding or rebound.  Musculoskeletal:        General: No tenderness or deformity.     Cervical back: Neck supple.  Skin:    General: Skin is warm  and dry.     Findings: No rash.  Neurological:     General: No focal deficit present.     Mental Status: She is alert.     Cranial Nerves: No cranial nerve deficit (no facial droop, extraocular movements intact, no slurred speech).     Sensory: No sensory deficit.     Motor: No abnormal muscle tone or seizure activity.     Coordination: Coordination normal.  Psychiatric:        Mood and Affect: Mood normal.     ED Results / Procedures / Treatments   Labs (all labs ordered are listed, but only abnormal results are displayed) Labs Reviewed  COMPREHENSIVE METABOLIC PANEL - Abnormal; Notable for the following components:      Result Value   Potassium 3.1 (*)    Chloride 95 (*)    CO2 <7 (*)    Glucose, Bld 123 (*)    Creatinine, Ser 1.02 (*)    Calcium 8.4 (*)    Total Protein 8.9 (*)    AST 232 (*)    ALT 64 (*)    Alkaline Phosphatase 185 (*)    Total Bilirubin 3.8 (*)    All other components within normal limits  CBC WITH DIFFERENTIAL/PLATELET - Abnormal; Notable for the following components:   RBC 3.65 (*)    MCV 106.8 (*)    Platelets 76 (*)    All other components within normal limits  BLOOD GAS, VENOUS - Abnormal; Notable for the following components:   pH, Ven 7.13 (*)    pCO2, Ven 23 (*)    Bicarbonate 7.7 (*)    Acid-base deficit 19.8 (*)    All other components within normal limits  CULTURE, BLOOD (ROUTINE X 2)  CULTURE, BLOOD (ROUTINE X 2)  LACTIC ACID, PLASMA  PROTIME-INR  LACTIC ACID, PLASMA  URINALYSIS, ROUTINE W REFLEX MICROSCOPIC  OSMOLALITY  MAGNESIUM  SALICYLATE LEVEL  ETHANOL  BETA-HYDROXYBUTYRIC ACID  CBG MONITORING, ED    EKG EKG Interpretation  Date/Time:  Tuesday June 20 2022 08:44:46 EDT Ventricular Rate:  115 PR Interval:  134 QRS Duration: 84 QT Interval:  390 QTC Calculation: 540 R Axis:   79 Text Interpretation: Sinus tachycardia Right atrial enlargement Minimal ST depression, inferior leads Prolonged QT interval Since last  tracing rate faster Confirmed by Linwood Dibbles [phone]) on 06/20/2022 9:27:19 AM  Radiology DG Chest 2 View  Result Date: 06/20/2022 CLINICAL DATA:  Shortness of  breath EXAM: CHEST - 2 VIEW COMPARISON:  02/09/2022 FINDINGS: The heart size and mediastinal contours are within normal limits. Both lungs are clear. The visualized skeletal structures are unremarkable. IMPRESSION: No active cardiopulmonary disease. Electronically Signed   By: Ernie Avena M.D.   On: 06/20/2022 09:10    Procedures .Critical Care  Performed by: Linwood Dibbles, MD Authorized by: Linwood Dibbles, MD   Critical care provider statement:    Critical care time (minutes):  30   Critical care was time spent personally by me on the following activities:  Development of treatment plan with patient or surrogate, discussions with consultants, evaluation of patient's response to treatment, examination of patient, ordering and review of laboratory studies, ordering and review of radiographic studies, ordering and performing treatments and interventions, pulse oximetry, re-evaluation of patient's condition and review of old charts     Medications Ordered in ED Medications  sodium chloride 0.9 % bolus 1,000 mL (1,000 mLs Intravenous New Bag/Given 06/20/22 1016)    Followed by  0.9 %  sodium chloride infusion (1,000 mLs Intravenous New Bag/Given 06/20/22 1018)  dextrose 10 % infusion (has no administration in time range)  magnesium sulfate IVPB 1 g 100 mL (has no administration in time range)  0.9 % NaCl with KCl 20 mEq/ L  infusion (has no administration in time range)    ED Course/ Medical Decision Making/ A&P Clinical Course as of 06/20/22 1117  Tue Jun 20, 2022  0940 DG Chest 2 View Cxr images and radiology report reviewed.  No acute findings [JK]  1050 Comprehensive metabolic panel(!) Labs show anion gap acidosis.  pH is decreased at 7.13 [JK]  1051 CBC with Differential(!) Normal [JK]  1051 Blood gas, venous(!!) Metabolic  acidosis [JK]  1116 Discussed with Dr Robb Matar regarding admission [JK]    Clinical Course User Index [JK] Linwood Dibbles, MD                           Medical Decision Making Problems Addressed: Alcoholic ketoacidosis: acute illness or injury that poses a threat to life or bodily functions Hypokalemia: acute illness or injury that poses a threat to life or bodily functions Metabolic acidemia: acute illness or injury that poses a threat to life or bodily functions  Amount and/or Complexity of Data Reviewed Labs: ordered. Decision-making details documented in ED Course. Radiology: ordered and independent interpretation performed. Decision-making details documented in ED Course.  Risk Prescription drug management. Drug therapy requiring intensive monitoring for toxicity. Decision regarding hospitalization.   Patient presented to ED with complaints of decreased p.o. intake shortness of breath.  Patient does admit to chronic alcohol use.  Patient's laboratory tests were notable for an anion gap metabolic acidosis.  Patient denies any toxic alcohol ingestion.  She is not uremic.  Not consistent with DKA.  No evidence of lactic acidosis.  Patient denies any salicylate ingestion but we will send off a level.  I suspect this could be alcoholic ketoacidosis.  She does have elevated LFTs.  She does admit to chronic alcohol use but has a history of alcoholism.  We will start with a dextrose infusion.  Continue IV hydration.  Replete her potassium and I have also ordered IV magnesium.  I will consult the medical service for admission.        Final Clinical Impression(s) / ED Diagnoses Final diagnoses:  Alcoholic ketoacidosis  Metabolic acidemia  Hypokalemia    Rx / DC Orders ED Discharge  Orders     None         Linwood Dibbles, MD 06/20/22 [phone]

## 2022-06-20 NOTE — H&P (Signed)
History and Physical    Patient: Samantha Rosales FAO:[id number] DOB: 1981-10-30 DOA: 06/20/2022 DOS: the patient was seen and examined on 06/20/2022 PCP: Donita Brooks, MD  Patient coming from: Home  Chief Complaint:  Chief Complaint  Patient presents with   Shortness of Breath   Nausea   HPI: Samantha Rosales is a 41 y.o. female with medical history significant of abnormal Pap smear, allergic rhinitis, bacterial vaginosis, GERD, history of hepatitis due to methotrexate or alcohol use, hyperlipidemia, HSV-2 infection, hypertension, hypothyroidism, rheumatoid arthritis, seizures as a child but no episodes since 89, tobacco use, multiple episodes of alcoholic pancreatitis who is coming to the emergency department with complaints of dyspnea, nausea and vomiting since yesterday.  She started having abdominal pain and cramping 3 days ago.  She has not been eating for the past few days.  She told Dr. Lynelle Doctor earlier that she only drinks a beer a day, but admitted that she drinks at least 2 or 3 daily along with 2 liquid drinks.   No flank pain, dysuria, frequency or hematuria.Denied diarrhea, constipation, melena or hematochezia.  No fever, chills or night sweats. No sore throat, rhinorrhea, dyspnea, wheezing or hemoptysis.  No chest pain, palpitations, diaphoresis, PND, orthopnea or pitting edema of the lower extremities. No polyuria, polydipsia, polyphagia or blurred vision.  She denied drinking any antifreeze, cleaning or industrial products with non-ethanol types of alcohol.    ED course: Initial vital signs were temperature 97.8 F, pulse 112, respiration 18, BP 137/93 mmHg and O2 sat 99% on room air.  The patient received 1000 mL of normal saline bolus  Lab work: Urinalysis showed moderate hemoglobinuria, ketonuria of 80 and proteinuria 100 mg/dL with rare bacteria on microscopic examination.  CBC is her white count 6.2, hemoglobin 12.4 g/dL with an MCV of 696.2 fL and platelets of 76.  PT and INR  were normal.  Lactic acid unremarkable.  CMP showed a potassium of 3.1, chloride 95 and CO2 of less than 7 mmol/L with non-calculated anion gap.  A venous blood gas showed a pH of 7.13, PCO2 of 23 and PO2 of 35 mmHg.  Bicarbonate was 7.7 mmol/L and acid-base deficit 19.9 mmol/L.  BUN was 6, creatinine 1.02, calcium 8.4, bilirubin 3.8 and magnesium 1.7 mg/dL.  Total protein 8.9 and albumin 4.2 g/dL.  AST 232, ALT 64 and alkaline phosphatase 185 units/L.  Salicylate and alcohol levels are normal.  Imaging: A 2 view chest radiograph with no active cardiopulmonary disease.  RUQ ultrasound showed gallbladder sludge but no evidence of gallstones, wall thickening or surrounding fluid.  There was no CBD dilatation.  She has hepatic asteatosis.   Review of Systems: As mentioned in the history of present illness. All other systems reviewed and are negative.  Past Medical History:  Diagnosis Date   Abnormal Pap smear    Alcohol use    Allergic rhinitis    BV (bacterial vaginosis)    GERD (gastroesophageal reflux disease)    Hepatitis    LFT's >1000 (2019) due to methotrexate vs alcohol   High cholesterol    HSV-2 (herpes simplex virus 2) infection    Hypertension    Hypothyroidism 10/17/2019   Pancreatitis    12/19 (thought to be due to alcohol)   Rheumatoid arthritis (HCC)    Seizures (HCC)    dx as child with epilepsy but no seizures since 99 and on no meds   Vaginal Pap smear, abnormal    Past Surgical History:  Procedure Laterality Date   CERVICAL CONIZATION W/BX  12/20/2012   Procedure: CONIZATION CERVIX WITH BIOPSY;  Surgeon: Lazaro Arms, MD;  Location: [facility];  Service: Gynecology;  Laterality: N/A;   HOLMIUM LASER APPLICATION  12/20/2012   Procedure: HOLMIUM LASER APPLICATION;  Surgeon: Lazaro Arms, MD;  Location: [facility];  Service: Gynecology;  Laterality: N/A;   Social History:  reports that she has been smoking cigarettes. She has a 7.50 pack-year smoking history. She has never used  smokeless tobacco. She reports current alcohol use of about 28.0 standard drinks of alcohol per week. She reports that she does not use drugs.  Allergies  Allergen Reactions   Penicillins Hives    Has patient had a PCN reaction causing immediate rash, facial/tongue/throat swelling, SOB or lightheadedness with hypotension: Unknown Has patient had a PCN reaction causing severe rash involving mucus membranes or skin necrosis: Unknown Has patient had a PCN reaction that required hospitalization: Unknown Has patient had a PCN reaction occurring within the last 10 years: No If all of the above answers are "NO", then may proceed with Cephalosporin use.     Family History  Problem Relation Age of Onset   Hypertension Mother    Hypertension Father    Pancreatic cancer Paternal Uncle    Colon cancer Neg Hx    Esophageal cancer Neg Hx    Inflammatory bowel disease Neg Hx    Liver disease Neg Hx    Rectal cancer Neg Hx    Stomach cancer Neg Hx     Prior to Admission medications   Medication Sig Start Date End Date Taking? Authorizing Provider  Biotin 1000 MCG tablet Take 1,000 mcg by mouth daily.    [provider]  Cholecalciferol (VITAMIN D3) 50 MCG (2000 UT) CAPS Take 4,000 Units by mouth daily.    [provider]  cycloSPORINE (RESTASIS) 0.05 % ophthalmic emulsion Place 1 drop into both eyes 2 (two) times daily. 12/31/20   [provider]  escitalopram (LEXAPRO) 10 MG tablet Take 1 tablet (10 mg total) by mouth daily. 02/17/22   Donita Brooks, MD  fluticasone (FLONASE) 50 MCG/ACT nasal spray USE TWO SPRAY(S) IN EACH NOSTRIL ONCE DAILY Patient taking differently: 1 spray daily as needed for allergies. 06/21/17   Donita Brooks, MD  folic acid (FOLVITE) 1 MG tablet Take 1 mg by mouth daily.    [provider]  HUMIRA PEN 40 MG/0.4ML PNKT Inject 40 mg into the skin every 14 (fourteen) days.  05/28/20   [provider]  ibuprofen (ADVIL) 600 MG  tablet Take 1 tablet (600 mg total) by mouth every 6 (six) hours as needed. Patient taking differently: Take 1,200 mg by mouth every 6 (six) hours as needed for mild pain. 05/03/21   Moshe Cipro, NP  Icosapent Ethyl (VASCEPA) 1 g CAPS Take 2 capsules (2 g total) by mouth 2 (two) times daily. 09/05/18   Donita Brooks, MD  nebivolol (BYSTOLIC) 10 MG tablet Take 1 tablet (10 mg total) by mouth daily. 04/05/21   Donita Brooks, MD  potassium chloride SA (KLOR-CON M) 20 MEQ tablet Take 2 tablets (40 mEq total) by mouth daily. 04/21/22 05/21/22  Donita Brooks, MD  thiamine 100 MG tablet Take 100 mg by mouth 2 (two) times daily. 04/18/21   [provider]  zinc gluconate 50 MG tablet Take 50 mg by mouth daily.    [provider]    Physical Exam: Vitals:  06/20/22 1000 06/20/22 1030 06/20/22 1100 06/20/22 1122  BP: (!) 138/99 (!) 125/114 (!) 137/116   Pulse: 98 93 (!) 101   Resp: (!) 23 (!) 27 (!) 24   Temp:    (!) 97 F (36.1 C)  TempSrc:    Oral  SpO2: 100% 96% 100%    Physical Exam Vitals and nursing note reviewed.  Constitutional:      General: She is awake.     Appearance: She is well-developed. She is ill-appearing.     Comments: Chronically ill-appearing. Looks under nourished. Weight measurement pending.  HENT:     Head: Normocephalic.     Mouth/Throat:     Mouth: Mucous membranes are dry.  Eyes:     General: Scleral icterus present.     Pupils: Pupils are equal, round, and reactive to light.  Neck:     Vascular: No JVD.  Cardiovascular:     Rate and Rhythm: Regular rhythm. Tachycardia present.     Heart sounds: S1 normal and S2 normal.  Pulmonary:     Effort: Pulmonary effort is normal. Tachypnea present.     Breath sounds: Normal breath sounds. No wheezing, rhonchi or rales.  Abdominal:     General: Bowel sounds are normal.     Palpations: Abdomen is soft.  Musculoskeletal:     Cervical back: Neck supple.     Right lower leg: No edema.      Left lower leg: No edema.  Skin:    General: Skin is warm and dry.  Neurological:     General: No focal deficit present.     Mental Status: She is alert and oriented to person, place, and time.  Psychiatric:        Mood and Affect: Mood is anxious.    Data Reviewed:  Results are pending, will review when available.  Assessment and Plan: Principal Problem:   Metabolic acidosis Due to   Alcoholic ketoacidosis Observation/stepdown. Clear liquid diet. Advance as tolerated. Continue dextrose 10% infusion.. Monitor CBG closely. Follow-up BMP. BHA still pending.  Active Problems:   Hypokalemia Replacing. Magnesium was supplemented. Follow-up potassium level.    Alcohol abuse CIWA protocol with lorazepam. Magnesium sulfate supplementation. Folate, MVI and thiamine. Consult TOC.    Abnormal LFTs Alcohol cessation advised. Follow-up liver function in AM.    Essential hypertension Resume nebivolol Monitor BP and heart rate.     Advance Care Planning:   Code Status: Full Code   Consults:   Family Communication: Her mother was in the room.  Severity of Illness: The appropriate patient status for this patient is OBSERVATION. Observation status is judged to be reasonable and necessary in order to provide the required intensity of service to ensure the patient's safety. The patient's presenting symptoms, physical exam findings, and initial radiographic and laboratory data in the context of their medical condition is felt to place them at decreased risk for further clinical deterioration. Furthermore, it is anticipated that the patient will be medically stable for discharge from the hospital within 2 midnights of admission.   Author: Bobette Mo, MD 06/20/2022 12:53 PM  For on call review [email or web address].   This document was prepared using Dragon voice recognition software and may contain some unintended transcription errors.

## 2022-06-20 NOTE — ED Triage Notes (Signed)
Pt reports SHOB, N/V since yesterday. Pt denies CP

## 2022-06-20 NOTE — Progress Notes (Signed)
[facility] admitting physician addendum:  The nursing staff reported that the patient's CBG was 260 mg/dL on dextrose 33%.  She also had emesis in the emergency department prior to going to the stepdown unit.  She also had a BHA level of more than 8 mmol/L earlier due to alcoholic/fasting ketosis.  I have changed to NS plus KCl 20 mEq plus dextrose 5% at 125 mL/h and started an insulin infusion.  We will be following BMP and BHA.  Sanda Klein, MD.

## 2022-06-21 DIAGNOSIS — R7989 Other specified abnormal findings of blood chemistry: Secondary | ICD-10-CM

## 2022-06-21 DIAGNOSIS — F1010 Alcohol abuse, uncomplicated: Secondary | ICD-10-CM | POA: Diagnosis not present

## 2022-06-21 DIAGNOSIS — E8729 Other acidosis: Secondary | ICD-10-CM | POA: Diagnosis not present

## 2022-06-21 DIAGNOSIS — E872 Acidosis, unspecified: Secondary | ICD-10-CM | POA: Diagnosis not present

## 2022-06-21 DIAGNOSIS — E43 Unspecified severe protein-calorie malnutrition: Secondary | ICD-10-CM | POA: Insufficient documentation

## 2022-06-21 DIAGNOSIS — I10 Essential (primary) hypertension: Secondary | ICD-10-CM

## 2022-06-21 LAB — CBC
HCT: 31.2 % — ABNORMAL LOW (ref 36.0–46.0)
Hemoglobin: 10.3 g/dL — ABNORMAL LOW (ref 12.0–15.0)
MCH: 33.6 pg (ref 26.0–34.0)
MCHC: 33 g/dL (ref 30.0–36.0)
MCV: 101.6 fL — ABNORMAL HIGH (ref 80.0–100.0)
Platelets: 42 10*3/uL — ABNORMAL LOW (ref 150–400)
RBC: 3.07 MIL/uL — ABNORMAL LOW (ref 3.87–5.11)
RDW: 13.5 % (ref 11.5–15.5)
WBC: 6.2 10*3/uL (ref 4.0–10.5)
nRBC: 0 % (ref 0.0–0.2)

## 2022-06-21 LAB — GLUCOSE, CAPILLARY
Glucose-Capillary: 134 mg/dL — ABNORMAL HIGH (ref 70–99)
Glucose-Capillary: 134 mg/dL — ABNORMAL HIGH (ref 70–99)
Glucose-Capillary: 151 mg/dL — ABNORMAL HIGH (ref 70–99)
Glucose-Capillary: 153 mg/dL — ABNORMAL HIGH (ref 70–99)
Glucose-Capillary: 155 mg/dL — ABNORMAL HIGH (ref 70–99)
Glucose-Capillary: 175 mg/dL — ABNORMAL HIGH (ref 70–99)
Glucose-Capillary: 177 mg/dL — ABNORMAL HIGH (ref 70–99)
Glucose-Capillary: 98 mg/dL (ref 70–99)

## 2022-06-21 LAB — BASIC METABOLIC PANEL
Anion gap: 12 (ref 5–15)
BUN: 5 mg/dL — ABNORMAL LOW (ref 6–20)
CO2: 16 mmol/L — ABNORMAL LOW (ref 22–32)
Calcium: 9.2 mg/dL (ref 8.9–10.3)
Chloride: 112 mmol/L — ABNORMAL HIGH (ref 98–111)
Creatinine, Ser: 0.48 mg/dL (ref 0.44–1.00)
GFR, Estimated: >60 mL/min (ref >60)
Glucose, Bld: 140 mg/dL — ABNORMAL HIGH (ref 70–99)
Potassium: 2.7 mmol/L — CL (ref 3.5–5.1)
Sodium: 140 mmol/L (ref 135–145)

## 2022-06-21 LAB — HEMOGLOBIN A1C
Hgb A1c MFr Bld: 4.9 % (ref 4.8–5.6)
Mean Plasma Glucose: 93.93 mg/dL

## 2022-06-21 LAB — COMPREHENSIVE METABOLIC PANEL
ALT: 43 U/L (ref 0–44)
AST: 132 U/L — ABNORMAL HIGH (ref 15–41)
Albumin: 3.1 g/dL — ABNORMAL LOW (ref 3.5–5.0)
Alkaline Phosphatase: 142 U/L — ABNORMAL HIGH (ref 38–126)
Anion gap: 9 (ref 5–15)
BUN: 5 mg/dL — ABNORMAL LOW (ref 6–20)
CO2: 16 mmol/L — ABNORMAL LOW (ref 22–32)
Calcium: 8.5 mg/dL — ABNORMAL LOW (ref 8.9–10.3)
Chloride: 113 mmol/L — ABNORMAL HIGH (ref 98–111)
Creatinine, Ser: 0.61 mg/dL (ref 0.44–1.00)
GFR, Estimated: >60 mL/min (ref >60)
Glucose, Bld: 180 mg/dL — ABNORMAL HIGH (ref 70–99)
Potassium: 3.4 mmol/L — ABNORMAL LOW (ref 3.5–5.1)
Sodium: 138 mmol/L (ref 135–145)
Total Bilirubin: 2.1 mg/dL — ABNORMAL HIGH (ref 0.3–1.2)
Total Protein: 6.8 g/dL (ref 6.5–8.1)

## 2022-06-21 LAB — POTASSIUM: Potassium: 3.5 mmol/L (ref 3.5–5.1)

## 2022-06-21 LAB — MAGNESIUM: Magnesium: 1.9 mg/dL (ref 1.7–2.4)

## 2022-06-21 LAB — PHOSPHORUS
Phosphorus: <1 mg/dL — CL (ref 2.5–4.6)
Phosphorus: 1.7 mg/dL — ABNORMAL LOW (ref 2.5–4.6)

## 2022-06-21 LAB — BETA-HYDROXYBUTYRIC ACID: Beta-Hydroxybutyric Acid: 0.73 mmol/L — ABNORMAL HIGH (ref 0.05–0.27)

## 2022-06-21 MED ORDER — LACTATED RINGERS IV SOLN: INTRAVENOUS | Status: DC

## 2022-06-21 MED ORDER — LOSARTAN POTASSIUM 50 MG PO TABS
50.0000 mg | ORAL_TABLET | Freq: Every day | ORAL | Status: DC
  Administered 2022-06-21 – 2022-06-23 (×3): 50 mg via ORAL
  Filled 2022-06-21 (×3): qty 1

## 2022-06-21 MED ORDER — POTASSIUM CHLORIDE CRYS ER 20 MEQ PO TBCR
40.0000 meq | EXTENDED_RELEASE_TABLET | Freq: Once | ORAL | Status: AC
  Administered 2022-06-21: 40 meq via ORAL
  Filled 2022-06-21: qty 2

## 2022-06-21 MED ORDER — DEXTROSE 5 % IV SOLN
45.0000 mmol | Freq: Once | INTRAVENOUS | Status: AC
Start: 2022-06-21 — End: 2022-06-21
  Administered 2022-06-21: 45 mmol via INTRAVENOUS
  Filled 2022-06-21: qty 15

## 2022-06-21 MED ORDER — ENSURE ENLIVE PO LIQD
237.0000 mL | Freq: Two times a day (BID) | ORAL | Status: DC
  Administered 2022-06-22 – 2022-06-23 (×2): 237 mL via ORAL

## 2022-06-21 MED ORDER — TRAZODONE HCL 50 MG PO TABS: 25.0000 mg | ORAL_TABLET | Freq: Once | ORAL | Status: DC

## 2022-06-21 MED ORDER — POTASSIUM CHLORIDE 10 MEQ/100ML IV SOLN
10.0000 meq | INTRAVENOUS | Status: AC
  Administered 2022-06-21 (×4): 10 meq via INTRAVENOUS
  Filled 2022-06-21 (×4): qty 100

## 2022-06-21 MED ORDER — PROSOURCE PLUS PO LIQD
30.0000 mL | Freq: Two times a day (BID) | ORAL | Status: DC
  Administered 2022-06-22 – 2022-06-23 (×3): 30 mL via ORAL
  Filled 2022-06-21 (×3): qty 30

## 2022-06-21 MED ORDER — INSULIN ASPART 100 UNIT/ML IJ SOLN
0.0000 [IU] | Freq: Three times a day (TID) | INTRAMUSCULAR | Status: DC
  Administered 2022-06-21: 5 [IU] via SUBCUTANEOUS
  Administered 2022-06-22: 3 [IU] via SUBCUTANEOUS
  Administered 2022-06-22: 5 [IU] via SUBCUTANEOUS
  Administered 2022-06-23: 2 [IU] via SUBCUTANEOUS
  Administered 2022-06-23: 5 [IU] via SUBCUTANEOUS

## 2022-06-21 MED ORDER — INSULIN ASPART 100 UNIT/ML IJ SOLN: 0.0000 [IU] | Freq: Every day | INTRAMUSCULAR | Status: DC

## 2022-06-21 MED ORDER — BOOST / RESOURCE BREEZE PO LIQD CUSTOM: 1.0000 | Freq: Three times a day (TID) | ORAL | Status: DC

## 2022-06-21 MED ORDER — INSULIN ASPART 100 UNIT/ML IJ SOLN: 0.0000 [IU] | Freq: Three times a day (TID) | INTRAMUSCULAR | Status: DC

## 2022-06-21 NOTE — Progress Notes (Addendum)
Initial Nutrition Assessment  DOCUMENTATION CODES:   Severe malnutrition in context of chronic illness, Underweight  INTERVENTION:  - will order Boost Breeze po TID, each supplement provides 250 kcal and 9 grams of protein  - will order 30 ml Prosource Plus BID, each supplement provides 100 kcal and 15 grams protein.   - will order 1 tablet multivitamin with minerals/day.  - advance diet when feasible.    NUTRITION DIAGNOSIS:   Severe Malnutrition related to   as evidenced by severe muscle depletion, moderate fat depletion, moderate muscle depletion, percent weight loss.  GOAL:   Patient will meet greater than or equal to 90% of their needs  MONITOR:   Diet advancement, PO intake, Supplement acceptance, Labs, Weight trends  REASON FOR ASSESSMENT:   Malnutrition Screening Tool  ASSESSMENT:   41 year old female with medical history of rheumatoid arthritis, alcohol use, GERD, seizures, HTN, hypercholesterolemia, hepatitis, and hypothyroidism. She presented to the ED due to N/V and abdominal pain. In the ED she was noted to have severe metabolic acidosis thought to be related to alcoholic ketoacidosis. In the ED, she was noted to have hypokalemia and hypophosphatemia.  Patient's diet advanced from NPO to CLD following RD visit earlier this afternoon. Patient laying in bed with no visitors present.  Patient reports that it has been 3 days since she has eaten; this is the time frame of onset of symptoms that brought her to the hospital.   Patient reports that she is feeling very hungry and interested in being able to eat.  Patient reports overall decreased appetite for the past 1 year. She sometimes eats smaller portions or will skip a meal d/t lack of appetite.  She shares that her medications seem to be changed by her doctor every time she goes in for an appointment and that it can be difficult to recall what she is on and should be taking.   Weight yesterday was 110 lb and  weight on 02/17/22 was 130 lb. This indicates 20 lb weight loss (15% body weight) in the past 4 months; significant for time frame.   She shares that at home she takes daily: folate, thiamine, and vitamin D3.   Labs reviewed; CBGs: 134-177 mg/dl, K: 2.7 mmol/l, Cl: 657 mmol/l, BUN: 5 mg/dl, LFTs elevated.  Medications reviewed; 1 mg folvite/day, 1 g Mg sulfate x1 run 7/18, 1 tablet multivitamin with minerals/day, 40 mg IV protonix/day, 10 mEq IV KCl x2 runs 7/18 and x4 runs 7/19, 40 mEq Klor-Con x1 dose 7/19, 45 mmol IV KPhos x1 run 7/19, 100 mg thiamine/day.   IVF; LR @ 125 ml/hr.    NUTRITION - FOCUSED PHYSICAL EXAM:  Flowsheet Row Most Recent Value  Orbital Region Moderate depletion  Upper Arm Region Severe depletion  Thoracic and Lumbar Region Unable to assess  Buccal Region Moderate depletion  Temple Region Mild depletion  Clavicle Bone Region Moderate depletion  Clavicle and Acromion Bone Region Severe depletion  Scapular Bone Region Moderate depletion  Dorsal Hand Moderate depletion  Patellar Region Severe depletion  Anterior Thigh Region Severe depletion  Posterior Calf Region Severe depletion  Edema (RD Assessment) None  Hair Reviewed  Eyes Reviewed  Mouth Reviewed  Skin Reviewed  Nails Reviewed       Diet Order:   Diet Order             Diet clear liquid Room service appropriate? Yes; Fluid consistency: Thin  Diet effective now  EDUCATION NEEDS:   No education needs have been identified at this time  Skin:  Skin Assessment: Reviewed RN Assessment  Last BM:  7/19 (type 6 x1, small amount)  Height:   Ht Readings from Last 1 Encounters:  06/20/22 5\' 8"  (1.727 m)    Weight:   Wt Readings from Last 1 Encounters:  06/20/22 49.8 kg     BMI:  Body mass index is 16.69 kg/m.  Estimated Nutritional Needs:  Kcal:  1800-2000 kcal Protein:  90-105 grams Fluid:  >/= 2.2 L/day     06/22/22, MS, RD, LDN,  CNSC Registered Dietitian II Inpatient Clinical Nutrition RD pager # and on-call/weekend pager # available in [facility]

## 2022-06-21 NOTE — Progress Notes (Signed)
PROGRESS NOTE    Samantha Rosales  IOE:[id number] DOB: [birth year] DOA: 06/20/2022 PCP: Donita Brooks, MD    Brief Narrative:  41 year old female with a history of rheumatoid arthritis, alcohol use, admitted to the hospital with nausea and vomiting, abdominal pain.  Noted to have severe metabolic acidosis felt to be related to alcoholic ketoacidosis.  She started on aggressive IV fluid resuscitation.  Noted to have significant electrolyte abnormalities including hypokalemia and hypophosphatemia.   Assessment & Plan:   Principal Problem:   Metabolic acidosis Active Problems:   Hypokalemia   Alcohol abuse   Essential hypertension   Abnormal LFTs   Alcoholic ketoacidosis   Metabolic acidosis Alcoholic ketoacidosis -Treated with dextrose infusion/aggressive hydration -Beta hydroxybutyric acid elevated on admission, but has since trended down -A1c noted to be 4.9 -Overall serum bicarbonate has improved -She was placed on insulin infusion, blood sugars are not elevated -We will discontinue further insulin infusion, monitor blood sugars  Hypokalemia -Replace  Hypophosphatemia -Replace  Thrombocytopenia -Suspect is related to alcohol consumption -Continue to monitor  Hypertension -Continue unable  Rheumatoid arthritis -On chronic Humira therapy  Elevated LFTs -Likely related to alcohol -Appears to be improving  DVT prophylaxis: SCDs Start: 06/20/22 1127  Code Status: Full code Family Communication: No family present Disposition Plan: Status is: Observation The patient remains OBS appropriate and will d/c before 2 midnights.     Consultants:    Procedures:    Antimicrobials:      Subjective: She does not have any nausea or vomiting.  She wants to try to eat.  Objective: Vitals:   06/21/22 1000 06/21/22 1100 06/21/22 1136 06/21/22 1200  BP: (!) 161/97 (!) 149/105 (!) 152/106 (!) 136/99  Pulse: 77 82 86 81  Resp: 18 20 15 20   Temp:  98.4 F  (36.9 C)    TempSrc:  Oral    SpO2: 100% 100% 100% 99%  Weight:      Height:        Intake/Output Summary (Last 24 hours) at 06/21/2022 1251 Last data filed at 06/21/2022 1049 Gross per 24 hour  Intake 3982.37 ml  Output 1350 ml  Net 2632.37 ml   Filed Weights   06/20/22 1700  Weight: 49.8 kg    Examination:  General exam: Appears calm and comfortable  Respiratory system: Clear to auscultation. Respiratory effort normal. Cardiovascular system: S1 & S2 heard, RRR. No JVD, murmurs, rubs, gallops or clicks. No pedal edema. Gastrointestinal system: Abdomen is nondistended, soft and nontender. No organomegaly or masses felt. Normal bowel sounds heard. Central nervous system: Alert and oriented. No focal neurological deficits. Extremities: Symmetric 5 x 5 power. Skin: No rashes, lesions or ulcers Psychiatry: Judgement and insight appear normal. Mood & affect appropriate.     Data Reviewed: I have personally reviewed following labs and imaging studies  CBC: Recent Labs  Lab 06/20/22 0912 06/21/22 0134  WBC 6.2 6.2  NEUTROABS 4.8  --   HGB 12.4 10.3*  HCT 39.0 31.2*  MCV 106.8* 101.6*  PLT 76* 42*   Basic Metabolic Panel: Recent Labs  Lab 06/20/22 0912 06/20/22 1320 06/20/22 1733 06/20/22 2104 06/21/22 0134 06/21/22 0540  NA 139  --  138 137 138 140  K 3.1*  --  4.1 3.4* 3.4* 2.7*  CL 95*  --  111 108 113* 112*  CO2 <7*  --  <7* 9* 16* 16*  GLUCOSE 123*  --  247* 263* 180* 140*  BUN 6  --  <5* 5*  5* 5*  CREATININE 1.02*  --  0.78 0.79 0.61 0.48  CALCIUM 8.4*  --  6.4* 8.1* 8.5* 9.2  MG  --  1.7  --   --  1.9  --   PHOS  --   --   --   --  <1.0*  --    GFR: Estimated Creatinine Clearance: 72.8 mL/min (by C-G formula based on SCr of 0.48 mg/dL). Liver Function Tests: Recent Labs  Lab 06/20/22 0912 06/21/22 0134  AST 232* 132*  ALT 64* 43  ALKPHOS 185* 142*  BILITOT 3.8* 2.1*  PROT 8.9* 6.8  ALBUMIN 4.2 3.1*   No results for input(s): "LIPASE",  "AMYLASE" in the last 168 hours. No results for input(s): "AMMONIA" in the last 168 hours. Coagulation Profile: Recent Labs  Lab 06/20/22 0912  INR 1.1   Cardiac Enzymes: No results for input(s): "CKTOTAL", "CKMB", "CKMBINDEX", "TROPONINI" in the last 168 hours. BNP (last 3 results) No results for input(s): "PROBNP" in the last 8760 hours. HbA1C: Recent Labs    06/21/22 0134  HGBA1C 4.9   CBG: Recent Labs  Lab 06/21/22 0248 06/21/22 0337 06/21/22 0449 06/21/22 0544 06/21/22 0641  GLUCAP 155* 151* 153* 134* 134*   Lipid Profile: No results for input(s): "CHOL", "HDL", "LDLCALC", "TRIG", "CHOLHDL", "LDLDIRECT" in the last 72 hours. Thyroid Function Tests: No results for input(s): "TSH", "T4TOTAL", "FREET4", "T3FREE", "THYROIDAB" in the last 72 hours. Anemia Panel: No results for input(s): "VITAMINB12", "FOLATE", "FERRITIN", "TIBC", "IRON", "RETICCTPCT" in the last 72 hours. Sepsis Labs: Recent Labs  Lab 06/20/22 0912  LATICACIDVEN 1.9    Recent Results (from the past 240 hour(s))  Culture, blood (Routine x 2)     Status: None (Preliminary result)   Collection Time: 06/20/22  9:12 AM   Specimen: BLOOD  Result Value Ref Range Status   Specimen Description   Final    BLOOD LEFT ANTECUBITAL Performed at [facility], [address], [place], [place] [address]    Special Requests   Final    BOTTLES DRAWN AEROBIC ONLY Blood Culture results may not be optimal due to an inadequate volume of blood received in culture bottles Performed at [facility], [address] [place], [place] [address]    Culture   Final    NO GROWTH < 24 HOURS Performed at [facility], [address] [place], [place] [address]    Report Status PENDING  Incomplete  Culture, blood (Routine x 2)     Status: None (Preliminary result)   Collection Time: 06/20/22  9:31 AM   Specimen: BLOOD  Result Value Ref Range Status   Specimen Description   Final     BLOOD RIGHT ANTECUBITAL Performed at [facility], [address] [place], [place] [address]    Special Requests   Final    BOTTLES DRAWN AEROBIC AND ANAEROBIC Blood Culture results may not be optimal due to an inadequate volume of blood received in culture bottles Performed at [facility], [address] [place], [place] [address]    Culture   Final    NO GROWTH < 24 HOURS Performed at [facility], [address], [place], [place] [address]    Report Status PENDING  Incomplete  MRSA Next Gen by PCR, Nasal     Status: None   Collection Time: 06/20/22  4:46 PM   Specimen: Nasal Mucosa; Nasal Swab  Result Value Ref Range Status  MRSA by PCR Next Gen NOT DETECTED NOT DETECTED Final    Comment: (NOTE) The GeneXpert MRSA Assay (FDA approved for NASAL specimens only), is one component of a comprehensive MRSA colonization surveillance program. It is not intended to diagnose MRSA infection nor to guide or monitor treatment for MRSA infections. Test performance is not FDA approved in patients less than 67 years old. Performed at [facility], [address], [place], [place] [address]          Radiology Studies: US Abdomen Limited RUQ (LIVER/GB)  Result Date: 06/20/2022 CLINICAL DATA:  Elevated liver function tests. EXAM: ULTRASOUND ABDOMEN LIMITED RIGHT UPPER QUADRANT COMPARISON:  CT June of 2021 FINDINGS: Gallbladder: The gallbladder contains some sludge but no stones. No wall thickening or surrounding fluid. No Murphy sign. Common bile duct: Diameter: 4.7 mm, upper limits of normal. No intrahepatic ductal dilatation. Liver: Echogenic liver consistent with steatosis. No focal lesion. Portal vein is patent on color Doppler imaging with normal direction of blood flow towards the liver. Other: None. IMPRESSION: Gallbladder sludge but no evidence of gallstones, wall thickening or surrounding fluid. No ductal dilatation.  Diffusely echogenic liver parenchyma consistent with steatosis. Electronically Signed   By: Paulina Fusi M.D.   On: 06/20/2022 12:00   DG Chest 2 View  Result Date: 06/20/2022 CLINICAL DATA:  Shortness of breath EXAM: CHEST - 2 VIEW COMPARISON:  02/09/2022 FINDINGS: The heart size and mediastinal contours are within normal limits. Both lungs are clear. The visualized skeletal structures are unremarkable. IMPRESSION: No active cardiopulmonary disease. Electronically Signed   By: Ernie Avena M.D.   On: 06/20/2022 09:10        Scheduled Meds:  Chlorhexidine Gluconate Cloth  6 each Topical Daily   folic acid  1 mg Oral Daily   LORazepam  0-4 mg Intravenous Q6H   Followed by   Melene Muller ON 06/22/2022] LORazepam  0-4 mg Intravenous Q12H   multivitamin with minerals  1 tablet Oral Daily   nebivolol  10 mg Oral Daily   nicotine  14 mg Transdermal Daily   pantoprazole (PROTONIX) IV  40 mg Intravenous Q24H   thiamine  100 mg Oral Daily   Or   thiamine  100 mg Intravenous Daily   Continuous Infusions:  lactated ringers     potassium chloride 10 mEq (06/21/22 1239)   potassium PHOSPHATE IVPB (in mmol)       LOS: 0 days    Time spent:    Erick Blinks, MD [facility]   If 7PM-7AM, please contact night-coverage [email or web address]  06/21/2022, 12:51 PM

## 2022-06-21 NOTE — Plan of Care (Signed)
  Problem: Education: Goal: Knowledge of General Education information will improve Description: Including pain rating scale, medication(s)/side effects and non-pharmacologic comfort measures Outcome: Progressing   Problem: Health Behavior/Discharge Planning: Goal: Ability to manage health-related needs will improve Outcome: Progressing   Problem: Clinical Measurements: Goal: Ability to maintain clinical measurements within normal limits will improve Outcome: Progressing Goal: Will remain free from infection Outcome: Progressing Goal: Diagnostic test results will improve Outcome: Progressing Goal: Respiratory complications will improve Outcome: Progressing Goal: Cardiovascular complication will be avoided Outcome: Progressing   Problem: Activity: Goal: Risk for activity intolerance will decrease Outcome: Progressing   Problem: Nutrition: Goal: Adequate nutrition will be maintained Outcome: Progressing   Problem: Coping: Goal: Level of anxiety will decrease Outcome: Progressing   Problem: Elimination: Goal: Will not experience complications related to bowel motility Outcome: Progressing Goal: Will not experience complications related to urinary retention Outcome: Progressing   Problem: Skin Integrity: Goal: Risk for impaired skin integrity will decrease Outcome: Progressing   Problem: Education: Goal: Knowledge of disease or condition will improve Outcome: Progressing Goal: Understanding of discharge needs will improve Outcome: Progressing   Problem: Health Behavior/Discharge Planning: Goal: Ability to identify changes in lifestyle to reduce recurrence of condition will improve Outcome: Progressing Goal: Identification of resources available to assist in meeting health care needs will improve Outcome: Progressing   Problem: Physical Regulation: Goal: Complications related to the disease process, condition or treatment will be avoided or minimized Outcome:  Progressing   Problem: Education: Goal: Ability to describe self-care measures that may prevent or decrease complications (Diabetes Survival Skills Education) will improve Outcome: Progressing Goal: Individualized Educational Video(s) Outcome: Progressing   Problem: Coping: Goal: Ability to adjust to condition or change in health will improve Outcome: Progressing   Problem: Fluid Volume: Goal: Ability to maintain a balanced intake and output will improve Outcome: Progressing   Problem: Health Behavior/Discharge Planning: Goal: Ability to identify and utilize available resources and services will improve Outcome: Progressing Goal: Ability to manage health-related needs will improve Outcome: Progressing   Problem: Metabolic: Goal: Ability to maintain appropriate glucose levels will improve Outcome: Progressing   Problem: Nutritional: Goal: Maintenance of adequate nutrition will improve Outcome: Progressing Goal: Progress toward achieving an optimal weight will improve Outcome: Progressing   Problem: Skin Integrity: Goal: Risk for impaired skin integrity will decrease Outcome: Progressing   Problem: Tissue Perfusion: Goal: Adequacy of tissue perfusion will improve Outcome: Progressing

## 2022-06-22 DIAGNOSIS — E872 Acidosis, unspecified: Secondary | ICD-10-CM | POA: Diagnosis not present

## 2022-06-22 DIAGNOSIS — R7989 Other specified abnormal findings of blood chemistry: Secondary | ICD-10-CM | POA: Diagnosis not present

## 2022-06-22 DIAGNOSIS — E8729 Other acidosis: Secondary | ICD-10-CM | POA: Diagnosis not present

## 2022-06-22 DIAGNOSIS — F1010 Alcohol abuse, uncomplicated: Secondary | ICD-10-CM | POA: Diagnosis not present

## 2022-06-22 LAB — COMPREHENSIVE METABOLIC PANEL
ALT: 38 U/L (ref 0–44)
AST: 120 U/L — ABNORMAL HIGH (ref 15–41)
Albumin: 3 g/dL — ABNORMAL LOW (ref 3.5–5.0)
Alkaline Phosphatase: 161 U/L — ABNORMAL HIGH (ref 38–126)
Anion gap: 11 (ref 5–15)
BUN: <5 mg/dL — ABNORMAL LOW (ref 6–20)
CO2: 18 mmol/L — ABNORMAL LOW (ref 22–32)
Calcium: 8.8 mg/dL — ABNORMAL LOW (ref 8.9–10.3)
Chloride: 111 mmol/L (ref 98–111)
Creatinine, Ser: 0.44 mg/dL (ref 0.44–1.00)
GFR, Estimated: >60 mL/min (ref >60)
Glucose, Bld: 132 mg/dL — ABNORMAL HIGH (ref 70–99)
Potassium: 3.4 mmol/L — ABNORMAL LOW (ref 3.5–5.1)
Sodium: 140 mmol/L (ref 135–145)
Total Bilirubin: 1.7 mg/dL — ABNORMAL HIGH (ref 0.3–1.2)
Total Protein: 6.8 g/dL (ref 6.5–8.1)

## 2022-06-22 LAB — GLUCOSE, CAPILLARY
Glucose-Capillary: 118 mg/dL — ABNORMAL HIGH (ref 70–99)
Glucose-Capillary: 107 mg/dL — ABNORMAL HIGH (ref 70–99)
Glucose-Capillary: 145 mg/dL — ABNORMAL HIGH (ref 70–99)
Glucose-Capillary: 137 mg/dL — ABNORMAL HIGH (ref 70–99)
Glucose-Capillary: 240 mg/dL — ABNORMAL HIGH (ref 70–99)
Glucose-Capillary: 98 mg/dL (ref 70–99)
Glucose-Capillary: 241 mg/dL — ABNORMAL HIGH (ref 70–99)
Glucose-Capillary: 119 mg/dL — ABNORMAL HIGH (ref 70–99)
Glucose-Capillary: 157 mg/dL — ABNORMAL HIGH (ref 70–99)

## 2022-06-22 LAB — CBC
HCT: 30.1 % — ABNORMAL LOW (ref 36.0–46.0)
Hemoglobin: 10.2 g/dL — ABNORMAL LOW (ref 12.0–15.0)
MCH: 33.8 pg (ref 26.0–34.0)
MCHC: 33.9 g/dL (ref 30.0–36.0)
MCV: 99.7 fL (ref 80.0–100.0)
Platelets: 35 10*3/uL — ABNORMAL LOW (ref 150–400)
RBC: 3.02 MIL/uL — ABNORMAL LOW (ref 3.87–5.11)
RDW: 13.4 % (ref 11.5–15.5)
WBC: 5.1 10*3/uL (ref 4.0–10.5)
nRBC: 0 % (ref 0.0–0.2)

## 2022-06-22 LAB — MAGNESIUM: Magnesium: 1.4 mg/dL — ABNORMAL LOW (ref 1.7–2.4)

## 2022-06-22 LAB — VITAMIN B12: Vitamin B-12: 437 pg/mL (ref 180–914)

## 2022-06-22 LAB — PHOSPHORUS: Phosphorus: 1.8 mg/dL — ABNORMAL LOW (ref 2.5–4.6)

## 2022-06-22 MED ORDER — GLIPIZIDE 5 MG PO TABS
5.0000 mg | ORAL_TABLET | Freq: Every day | ORAL | Status: DC
  Filled 2022-06-22: qty 1

## 2022-06-22 MED ORDER — MAGNESIUM SULFATE 2 GM/50ML IV SOLN
2.0000 g | Freq: Once | INTRAVENOUS | Status: AC
Start: 2022-06-22 — End: 2022-06-22
  Administered 2022-06-22: 2 g via INTRAVENOUS
  Filled 2022-06-22: qty 50

## 2022-06-22 MED ORDER — POTASSIUM PHOSPHATES 15 MMOLE/5ML IV SOLN
30.0000 mmol | Freq: Once | INTRAVENOUS | Status: AC
  Administered 2022-06-22: 30 mmol via INTRAVENOUS
  Filled 2022-06-22: qty 10

## 2022-06-22 MED ORDER — POTASSIUM CHLORIDE CRYS ER 20 MEQ PO TBCR
20.0000 meq | EXTENDED_RELEASE_TABLET | Freq: Once | ORAL | Status: AC
  Administered 2022-06-22: 20 meq via ORAL
  Filled 2022-06-22: qty 1

## 2022-06-22 MED ORDER — MAGNESIUM SULFATE 4 GM/100ML IV SOLN
4.0000 g | Freq: Once | INTRAVENOUS | Status: DC
  Filled 2022-06-22: qty 100

## 2022-06-22 MED ORDER — PANTOPRAZOLE SODIUM 40 MG PO TBEC
40.0000 mg | DELAYED_RELEASE_TABLET | Freq: Every day | ORAL | Status: DC
  Administered 2022-06-22: 40 mg via ORAL
  Filled 2022-06-22: qty 1

## 2022-06-22 NOTE — Progress Notes (Signed)
PROGRESS NOTE    Samantha Rosales  DGU:[id number] DOB: [birth year] DOA: 06/20/2022 PCP: Donita Brooks, MD    Brief Narrative:  41 year old female with a history of rheumatoid arthritis, alcohol use, admitted to the hospital with nausea and vomiting, abdominal pain.  Noted to have severe metabolic acidosis felt to be related to alcoholic ketoacidosis.  She started on aggressive IV fluid resuscitation.  Noted to have significant electrolyte abnormalities including hypokalemia and hypophosphatemia.   Assessment & Plan:   Principal Problem:   Metabolic acidosis Active Problems:   Hypokalemia   Alcohol abuse   Essential hypertension   Abnormal LFTs   Alcoholic ketoacidosis   Protein-calorie malnutrition, severe   Metabolic acidosis Alcoholic ketoacidosis -Treated with dextrose infusion/aggressive hydration -Beta hydroxybutyric acid elevated on admission, but has since trended down -A1c noted to be 4.9 -Overall serum bicarbonate has improved -She was placed on insulin infusion, blood sugars are not elevated -insulin infusion subsequently discontinued -blood sugars have been variable, continue to monitor to ensure she does not need further treatment after discharge  Hypokalemia -Replace  Hypophosphatemia -Replace  ETOH use -seen by TOC and offered SA resources -no signs of withdrawal at present  Thrombocytopenia -Suspect is related to alcohol consumption -Continue to monitor  Hypertension -Continue bystolic -also started on losartan  Rheumatoid arthritis -On chronic Humira therapy  Elevated LFTs -Likely related to alcohol -Appears to be improving  DVT prophylaxis: SCDs Start: 06/20/22 1127  Code Status: Full code Family Communication: No family present Disposition Plan: Status is: Observation The patient remains OBS appropriate and will d/c before 2 midnights.     Consultants:    Procedures:    Antimicrobials:      Subjective: Tolerating  diet. Blood sugars have been fluctuating.   Objective: Vitals:   06/22/22 1500 06/22/22 1600 06/22/22 1700 06/22/22 1727  BP: (!) 146/105 (!) 133/111 (!) 131/112 (!) 135/103  Pulse:  81  93  Resp: 15 20 12    Temp:    98 F (36.7 C)  TempSrc:    Oral  SpO2:    95%  Weight:      Height:        Intake/Output Summary (Last 24 hours) at 06/22/2022 1910 Last data filed at 06/22/2022 1400 Gross per 24 hour  Intake 3000.51 ml  Output --  Net 3000.51 ml   Filed Weights   06/20/22 1700  Weight: 49.8 kg    Examination:  General exam: Appears calm and comfortable  Respiratory system: Clear to auscultation. Respiratory effort normal. Cardiovascular system: S1 & S2 heard, RRR. No JVD, murmurs, rubs, gallops or clicks. No pedal edema. Gastrointestinal system: Abdomen is nondistended, soft and nontender. No organomegaly or masses felt. Normal bowel sounds heard. Central nervous system: Alert and oriented. No focal neurological deficits. Extremities: Symmetric 5 x 5 power. Skin: No rashes, lesions or ulcers Psychiatry: Judgement and insight appear normal. Mood & affect appropriate.     Data Reviewed: I have personally reviewed following labs and imaging studies  CBC: Recent Labs  Lab 06/20/22 0912 06/21/22 0134 06/22/22 0319  WBC 6.2 6.2 5.1  NEUTROABS 4.8  --   --   HGB 12.4 10.3* 10.2*  HCT 39.0 31.2* 30.1*  MCV 106.8* 101.6* 99.7  PLT 76* 42* 35*   Basic Metabolic Panel: Recent Labs  Lab 06/20/22 1320 06/20/22 1733 06/20/22 2104 06/21/22 0134 06/21/22 0540 06/21/22 2025 06/22/22 0319  NA  --  138 137 138 140  --  140  K  --  4.1 3.4* 3.4* 2.7* 3.5 3.4*  CL  --  111 108 113* 112*  --  111  CO2  --  <7* 9* 16* 16*  --  18*  GLUCOSE  --  247* 263* 180* 140*  --  132*  BUN  --  <5* 5* 5* 5*  --  <5*  CREATININE  --  0.78 0.79 0.61 0.48  --  0.44  CALCIUM  --  6.4* 8.1* 8.5* 9.2  --  8.8*  MG 1.7  --   --  1.9  --   --  1.4*  PHOS  --   --   --  <1.0*  --  1.7*  1.8*   GFR: Estimated Creatinine Clearance: 72.8 mL/min (by C-G formula based on SCr of 0.44 mg/dL). Liver Function Tests: Recent Labs  Lab 06/20/22 0912 06/21/22 0134 06/22/22 0319  AST 232* 132* 120*  ALT 64* 43 38  ALKPHOS 185* 142* 161*  BILITOT 3.8* 2.1* 1.7*  PROT 8.9* 6.8 6.8  ALBUMIN 4.2 3.1* 3.0*   No results for input(s): "LIPASE", "AMYLASE" in the last 168 hours. No results for input(s): "AMMONIA" in the last 168 hours. Coagulation Profile: Recent Labs  Lab 06/20/22 0912  INR 1.1   Cardiac Enzymes: No results for input(s): "CKTOTAL", "CKMB", "CKMBINDEX", "TROPONINI" in the last 168 hours. BNP (last 3 results) No results for input(s): "PROBNP" in the last 8760 hours. HbA1C: Recent Labs    06/21/22 0134  HGBA1C 4.9   CBG: Recent Labs  Lab 06/21/22 2118 06/21/22 2201 06/22/22 0742 06/22/22 1128 06/22/22 1704  GLUCAP 98 98 241* 119* 157*   Lipid Profile: No results for input(s): "CHOL", "HDL", "LDLCALC", "TRIG", "CHOLHDL", "LDLDIRECT" in the last 72 hours. Thyroid Function Tests: No results for input(s): "TSH", "T4TOTAL", "FREET4", "T3FREE", "THYROIDAB" in the last 72 hours. Anemia Panel: Recent Labs    06/22/22 0755  VITAMINB12 437   Sepsis Labs: Recent Labs  Lab 06/20/22 0912  LATICACIDVEN 1.9    Recent Results (from the past 240 hour(s))  Culture, blood (Routine x 2)     Status: None (Preliminary result)   Collection Time: 06/20/22  9:12 AM   Specimen: BLOOD  Result Value Ref Range Status   Specimen Description   Final    BLOOD LEFT ANTECUBITAL Performed at [facility], [address] [place], [place] [address]    Special Requests   Final    BOTTLES DRAWN AEROBIC ONLY Blood Culture results may not be optimal due to an inadequate volume of blood received in culture bottles Performed at [facility], [address], [place], [place] [address]    Culture   Final    NO GROWTH 2 DAYS Performed at  [facility], [address] [place], [place] [address]    Report Status PENDING  Incomplete  Culture, blood (Routine x 2)     Status: None (Preliminary result)   Collection Time: 06/20/22  9:31 AM   Specimen: BLOOD  Result Value Ref Range Status   Specimen Description   Final    BLOOD RIGHT ANTECUBITAL Performed at [facility], [address] [place], [place] [address]    Special Requests   Final    BOTTLES DRAWN AEROBIC AND ANAEROBIC Blood Culture results may not be optimal due to an inadequate volume of blood received in culture bottles Performed at [facility], [address], [place], [place] [address]    Culture  Final    NO GROWTH 2 DAYS Performed at [facility], [address], [place], [place] [address]    Report Status PENDING  Incomplete  MRSA Next Gen by PCR, Nasal     Status: None   Collection Time: 06/20/22  4:46 PM   Specimen: Nasal Mucosa; Nasal Swab  Result Value Ref Range Status   MRSA by PCR Next Gen NOT DETECTED NOT DETECTED Final    Comment: (NOTE) The GeneXpert MRSA Assay (FDA approved for NASAL specimens only), is one component of a comprehensive MRSA colonization surveillance program. It is not intended to diagnose MRSA infection nor to guide or monitor treatment for MRSA infections. Test performance is not FDA approved in patients less than 83 years old. Performed at [facility], [address], [place], [place] [address]          Radiology Studies: No results found.      Scheduled Meds:  (feeding supplement) PROSource Plus  30 mL Oral BID BM   Chlorhexidine Gluconate Cloth  6 each Topical Daily   feeding supplement  237 mL Oral BID BM   folic acid  1 mg Oral Daily   insulin aspart  0-15 Units Subcutaneous TID WC   insulin aspart  0-5 Units Subcutaneous QHS   LORazepam  0-4 mg Intravenous Q12H   losartan  50 mg Oral Daily   multivitamin with minerals  1 tablet  Oral Daily   nebivolol  10 mg Oral Daily   nicotine  14 mg Transdermal Daily   pantoprazole  40 mg Oral QHS   thiamine  100 mg Oral Daily   Or   thiamine  100 mg Intravenous Daily   traZODone  25 mg Oral Once   Continuous Infusions:     LOS: 0 days    Time spent:    Erick Blinks, MD [facility]   If 7PM-7AM, please contact night-coverage [email or web address]  06/22/2022, 7:10 PM

## 2022-06-22 NOTE — TOC Progression Note (Signed)
Transition of Care [facility]) - Progression Note    Patient Details  Name: Samantha Rosales MRN: [id number] Date of Birth: 06-19-1981  [facility]) CM/SW Contact  Lennart Pall, LCSW Phone Number: 06/22/2022, 10:56 AM  Clinical Narrative:     TOC order placed to address SA concerns with pt.  Met with her this morning and she is very pleasant, however, when attempted to discuss SA concerns she quickly stated, "I don't want to talk about that" and states that she has cut down her ETOH use and does not feel it is a concern.  Pt IS agreeable with CSW placing SA resource information on AVS and this has been done. TOC will sign off at this time.  Please reorder if new needs arise.       Expected Discharge Plan and Services                                                 Social Determinants of Health (SDOH) Interventions    Readmission Risk Interventions     No data to display

## 2022-06-23 DIAGNOSIS — E872 Acidosis, unspecified: Secondary | ICD-10-CM | POA: Diagnosis not present

## 2022-06-23 DIAGNOSIS — R7989 Other specified abnormal findings of blood chemistry: Secondary | ICD-10-CM | POA: Diagnosis not present

## 2022-06-23 DIAGNOSIS — F1010 Alcohol abuse, uncomplicated: Secondary | ICD-10-CM | POA: Diagnosis not present

## 2022-06-23 DIAGNOSIS — E8729 Other acidosis: Secondary | ICD-10-CM | POA: Diagnosis not present

## 2022-06-23 LAB — MAGNESIUM: Magnesium: 1.9 mg/dL (ref 1.7–2.4)

## 2022-06-23 LAB — BASIC METABOLIC PANEL
Anion gap: 9 (ref 5–15)
BUN: 10 mg/dL (ref 6–20)
CO2: 25 mmol/L (ref 22–32)
Calcium: 9.1 mg/dL (ref 8.9–10.3)
Chloride: 106 mmol/L (ref 98–111)
Creatinine, Ser: 0.41 mg/dL — ABNORMAL LOW (ref 0.44–1.00)
GFR, Estimated: >60 mL/min (ref >60)
Glucose, Bld: 185 mg/dL — ABNORMAL HIGH (ref 70–99)
Potassium: 2.6 mmol/L — CL (ref 3.5–5.1)
Sodium: 140 mmol/L (ref 135–145)

## 2022-06-23 LAB — CBC
HCT: 28.5 % — ABNORMAL LOW (ref 36.0–46.0)
Hemoglobin: 9.8 g/dL — ABNORMAL LOW (ref 12.0–15.0)
MCH: 33.6 pg (ref 26.0–34.0)
MCHC: 34.4 g/dL (ref 30.0–36.0)
MCV: 97.6 fL (ref 80.0–100.0)
Platelets: 64 10*3/uL — ABNORMAL LOW (ref 150–400)
RBC: 2.92 MIL/uL — ABNORMAL LOW (ref 3.87–5.11)
RDW: 13.6 % (ref 11.5–15.5)
WBC: 3.9 10*3/uL — ABNORMAL LOW (ref 4.0–10.5)
nRBC: 0 % (ref 0.0–0.2)

## 2022-06-23 LAB — POTASSIUM: Potassium: 3.4 mmol/L — ABNORMAL LOW (ref 3.5–5.1)

## 2022-06-23 LAB — GLUCOSE, CAPILLARY
Glucose-Capillary: 180 mg/dL — ABNORMAL HIGH (ref 70–99)
Glucose-Capillary: 149 mg/dL — ABNORMAL HIGH (ref 70–99)
Glucose-Capillary: 209 mg/dL — ABNORMAL HIGH (ref 70–99)

## 2022-06-23 MED ORDER — BLOOD GLUCOSE MONITOR KIT: PACK | 0 refills | Status: DC

## 2022-06-23 MED ORDER — POTASSIUM CHLORIDE 10 MEQ/100ML IV SOLN
10.0000 meq | INTRAVENOUS | Status: AC
  Administered 2022-06-23 (×6): 10 meq via INTRAVENOUS
  Filled 2022-06-23 (×6): qty 100

## 2022-06-23 MED ORDER — LOSARTAN POTASSIUM 50 MG PO TABS: 50.0000 mg | ORAL_TABLET | Freq: Every day | ORAL | 0 refills | Status: DC

## 2022-06-23 MED ORDER — POTASSIUM CHLORIDE CRYS ER 20 MEQ PO TBCR
40.0000 meq | EXTENDED_RELEASE_TABLET | Freq: Once | ORAL | Status: AC
  Administered 2022-06-23: 40 meq via ORAL
  Filled 2022-06-23: qty 2

## 2022-06-23 NOTE — Discharge Summary (Signed)
Physician Discharge Summary  Samantha Rosales:[id number] DOB: 1981-03-16 DOA: 06/20/2022  PCP: [name], MD  Admit date: 06/20/2022 Discharge date: 06/23/2022  Admitted From: Home Disposition: Home  Recommendations for Outpatient Follow-up:  Follow up with PCP in 1-2 weeks Please obtain BMP/CBC in one week Patient has been advised to monitor her blood sugars at home and review them with PCP on follow-up  Home Health: Equipment/Devices:  Discharge Condition: Stable CODE STATUS: Full code Diet recommendation: Carb modified  Brief/Interim Summary: 41 year old female with a history of rheumatoid arthritis, alcohol use, admitted to the hospital with nausea and vomiting, abdominal pain.  Noted to have severe metabolic acidosis felt to be related to alcoholic ketoacidosis.  She started on aggressive IV fluid resuscitation.  Noted to have significant electrolyte abnormalities including hypokalemia and hypophosphatemia.  Discharge Diagnoses:  Principal Problem:   Metabolic acidosis Active Problems:   Hypokalemia   Alcohol abuse   Essential hypertension   Abnormal LFTs   Alcoholic ketoacidosis   Protein-calorie malnutrition, severe  Metabolic acidosis Alcoholic ketoacidosis -Treated with dextrose infusion/aggressive hydration -Beta hydroxybutyric acid elevated on admission, but has since trended down -A1c noted to be 4.9 -Overall serum bicarbonate has improved -She was placed on insulin infusion, blood sugars were not significantly elevated -insulin infusion subsequently discontinued -blood sugars have been variable, mostly controlled  Hyperglycemia, possibly new onset type 2 diabetes -She is noted to have elevated fasting blood sugars.  Her fasting sugar on 7/21 was 185, on 7/28 was 132.  This would give her a diagnosis of diabetes, although interestingly her A1c was noted to be 4.9. -I am not aware if she has any underlying hemoglobinopathy that would contribute to  falsely low A1c -Blood sugars are not significantly elevated requiring urgent treatment at this point -Patient plans on monitoring her blood sugars at home, she will be given a prescription for glucometer -She will plan on discussing further management with her primary care physician when she follows up in the next 1 to 2 weeks -Reluctant to start on medications at this time with her A1c being so low and fasting blood sugars being only mildly elevated -Would recommend that further work-up/discussion be had by PCP with patient.   Hypokalemia -Replace   Hypophosphatemia -Replace   ETOH use -seen by TOC and offered SA resources -no signs of withdrawal at present   Thrombocytopenia -Suspect is related to alcohol consumption -Continue to monitor   Hypertension -Continue bystolic -also started on losartan   Rheumatoid arthritis -On chronic Humira therapy   Elevated LFTs -Likely related to alcohol -Appears to be improving  Discharge Instructions  Discharge Instructions     Diet - low sodium heart healthy   Complete by: As directed    Increase activity slowly   Complete by: As directed       Allergies as of 06/23/2022       Reactions   Penicillins Hives   Has patient had a PCN reaction causing immediate rash, facial/tongue/throat swelling, SOB or lightheadedness with hypotension: Unknown Has patient had a PCN reaction causing severe rash involving mucus membranes or skin necrosis: Unknown Has patient had a PCN reaction that required hospitalization: Unknown Has patient had a PCN reaction occurring within the last 10 years: No If all of the above answers are "NO", then may proceed with Cephalosporin use.        Medication List     TAKE these medications    BIOTIN PO Take 1 tablet by mouth daily.  blood glucose meter kit and supplies Kit Dispense based on patient and insurance preference. Use up to four times daily as directed.   CALCIUM PO Take 1 tablet by  mouth daily.   cycloSPORINE 0.05 % ophthalmic emulsion Commonly known as: RESTASIS Place 1 drop into both eyes 2 (two) times daily as needed (watery eyes/irritation).   escitalopram 10 MG tablet Commonly known as: LEXAPRO Take 1 tablet (10 mg total) by mouth daily.   folic acid 1 MG tablet Commonly known as: FOLVITE Take 1 mg by mouth daily.   Humira 40 MG/0.4ML Pskt Generic drug: Adalimumab Inject 40 mg into the skin every 14 (fourteen) days.   losartan 50 MG tablet Commonly known as: COZAAR Take 1 tablet (50 mg total) by mouth daily. Start taking on: June 24, 2022   MAGNESIUM PO Take 1 tablet by mouth daily.   nebivolol 10 MG tablet Commonly known as: Bystolic Take 1 tablet (10 mg total) by mouth daily.   potassium chloride SA 20 MEQ tablet Commonly known as: KLOR-CON M Take 2 tablets (40 mEq total) by mouth daily.   thiamine 100 MG tablet Take 100 mg by mouth 2 (two) times daily.   TYLENOL PO Take 2 tablets by mouth every 6 (six) hours as needed (pain/headache).   VITAMIN D3 PO Take 1 tablet by mouth daily.        Allergies  Allergen Reactions   Penicillins Hives    Has patient had a PCN reaction causing immediate rash, facial/tongue/throat swelling, SOB or lightheadedness with hypotension: Unknown Has patient had a PCN reaction causing severe rash involving mucus membranes or skin necrosis: Unknown Has patient had a PCN reaction that required hospitalization: Unknown Has patient had a PCN reaction occurring within the last 10 years: No If all of the above answers are "NO", then may proceed with Cephalosporin use.     Consultations:    Procedures/Studies: US Abdomen Limited RUQ (LIVER/GB)  Result Date: 06/20/2022 CLINICAL DATA:  Elevated liver function tests. EXAM: ULTRASOUND ABDOMEN LIMITED RIGHT UPPER QUADRANT COMPARISON:  CT June of 2021 FINDINGS: Gallbladder: The gallbladder contains some sludge but no stones. No wall thickening or surrounding  fluid. No Murphy sign. Common bile duct: Diameter: 4.7 mm, upper limits of normal. No intrahepatic ductal dilatation. Liver: Echogenic liver consistent with steatosis. No focal lesion. Portal vein is patent on color Doppler imaging with normal direction of blood flow towards the liver. Other: None. IMPRESSION: Gallbladder sludge but no evidence of gallstones, wall thickening or surrounding fluid. No ductal dilatation. Diffusely echogenic liver parenchyma consistent with steatosis. Electronically Signed   By: Nelson Chimes M.D.   On: 06/20/2022 12:00   DG Chest 2 View  Result Date: 06/20/2022 CLINICAL DATA:  Shortness of breath EXAM: CHEST - 2 VIEW COMPARISON:  02/09/2022 FINDINGS: The heart size and mediastinal contours are within normal limits. Both lungs are clear. The visualized skeletal structures are unremarkable. IMPRESSION: No active cardiopulmonary disease. Electronically Signed   By: Elmer Picker M.D.   On: 06/20/2022 09:10      Subjective: Patient feels well today.  She does not have any nausea or vomiting.  Tolerating p.o. intake.  Ambulating without difficulty.  She is eager to discharge home.  Discharge Exam: Vitals:   06/22/22 1924 06/22/22 2326 06/23/22 0411 06/23/22 1340  BP: (!) 137/95 (!) 135/91 122/88 (!) 118/91  Pulse: 71 74 94 77  Resp: _0 Temp: 98.6 F (37 C) 98.6 F (37 C) 98.6  F (37 C) 98.6 F (37 C)  TempSrc: Oral  Oral Oral  SpO2: 100% 100% 99% 100%  Weight:      Height:        General: Pt is alert, awake, not in acute distress Cardiovascular: RRR, S1/S2 +, no rubs, no gallops Respiratory: CTA bilaterally, no wheezing, no rhonchi Abdominal: Soft, NT, ND, bowel sounds + Extremities: no edema, no cyanosis    The results of significant diagnostics from this hospitalization (including imaging, microbiology, ancillary and laboratory) are listed below for reference.     Microbiology: Recent Results (from the past 240 hour(s))  Culture,  blood (Routine x 2)     Status: None (Preliminary result)   Collection Time: 06/20/22  9:12 AM   Specimen: BLOOD  Result Value Ref Range Status   Specimen Description   Final    BLOOD LEFT ANTECUBITAL Performed at [facility] [address], [place], [place] [address]    Special Requests   Final    BOTTLES DRAWN AEROBIC ONLY Blood Culture results may not be optimal due to an inadequate volume of blood received in culture bottles Performed at [facility] [address], [place], [place] [address]    Culture   Final    NO GROWTH 3 DAYS Performed at [facility], [place] [address], [place], [place] [address]    Report Status PENDING  Incomplete  Culture, blood (Routine x 2)     Status: None (Preliminary result)   Collection Time: 06/20/22  9:31 AM   Specimen: BLOOD  Result Value Ref Range Status   Specimen Description   Final    BLOOD RIGHT ANTECUBITAL Performed at [facility] [address] [place], [place] [address]    Special Requests   Final    BOTTLES DRAWN AEROBIC AND ANAEROBIC Blood Culture results may not be optimal due to an inadequate volume of blood received in culture bottles Performed at [facility] [address] [place], [place] [address]    Culture   Final    NO GROWTH 3 DAYS Performed at [facility], [place] [address], [place], [place] [address]    Report Status PENDING  Incomplete  MRSA Next Gen by PCR, Nasal     Status: None   Collection Time: 06/20/22  4:46 PM   Specimen: Nasal Mucosa; Nasal Swab  Result Value Ref Range Status   MRSA by PCR Next Gen NOT DETECTED NOT DETECTED Final    Comment: (NOTE) The GeneXpert MRSA Assay (FDA approved for NASAL specimens only), is one component of a comprehensive MRSA colonization surveillance program. It is not intended to diagnose MRSA infection nor to guide or monitor treatment for MRSA infections. Test performance is not FDA  approved in patients less than 73 years old. Performed at [facility], [place] [address], [place],  [address]      Labs: BNP (last 3 results) No results for input(s): "BNP" in the last 8760 hours. Basic Metabolic Panel: Recent Labs  Lab 06/20/22 1320 06/20/22 1733 06/20/22 2104 06/21/22 0134 06/21/22 0540 06/21/22 2025 06/22/22 0319 06/23/22 0452 06/23/22 1358  NA  --    < > 137 138 140  --  140 140  --   K  --    < > 3.4* 3.4* 2.7* 3.5 3.4* 2.6* 3.4*  CL  --    < > 108 113* 112*  --  111 106  --  CO2  --    < > 9* 16* 16*  --  18* 25  --   GLUCOSE  --    < > 263* 180* 140*  --  132* 185*  --   BUN  --    < > 5* 5* 5*  --  <5* 10  --   CREATININE  --    < > 0.79 0.61 0.48  --  0.44 0.41*  --   CALCIUM  --    < > 8.1* 8.5* 9.2  --  8.8* 9.1  --   MG 1.7  --   --  1.9  --   --  1.4* 1.9  --   PHOS  --   --   --  <1.0*  --  1.7* 1.8*  --   --    < > = values in this interval not displayed.   Liver Function Tests: Recent Labs  Lab 06/20/22 0912 06/21/22 0134 06/22/22 0319  AST 232* 132* 120*  ALT 64* 43 38  ALKPHOS 185* 142* 161*  BILITOT 3.8* 2.1* 1.7*  PROT 8.9* 6.8 6.8  ALBUMIN 4.2 3.1* 3.0*   No results for input(s): "LIPASE", "AMYLASE" in the last 168 hours. No results for input(s): "AMMONIA" in the last 168 hours. CBC: Recent Labs  Lab 06/20/22 0912 06/21/22 0134 06/22/22 0319 06/23/22 0452  WBC 6.2 6.2 5.1 3.9*  NEUTROABS 4.8  --   --   --   HGB 12.4 10.3* 10.2* 9.8*  HCT 39.0 31.2* 30.1* 28.5*  MCV 106.8* 101.6* 99.7 97.6  PLT 76* 42* 35* 64*   Cardiac Enzymes: No results for input(s): "CKTOTAL", "CKMB", "CKMBINDEX", "TROPONINI" in the last 168 hours. BNP: Invalid input(s): "POCBNP" CBG: Recent Labs  Lab 06/22/22 1128 06/22/22 1704 06/22/22 2129 06/23/22 0747 06/23/22 1126  GLUCAP 119* 157* 180* 149* 209*   D-Dimer No results for input(s): "DDIMER" in the last 72 hours. Hgb A1c Recent Labs    06/21/22 0134   HGBA1C 4.9   Lipid Profile No results for input(s): "CHOL", "HDL", "LDLCALC", "TRIG", "CHOLHDL", "LDLDIRECT" in the last 72 hours. Thyroid function studies No results for input(s): "TSH", "T4TOTAL", "T3FREE", "THYROIDAB" in the last 72 hours.  Invalid input(s): "FREET3" Anemia work up Recent Labs    06/22/22 [place] 437   Urinalysis    Component Value Date/Time   COLORURINE YELLOW 06/20/2022 [place] 06/20/2022 0847   LABSPEC 1.012 06/20/2022 0847   PHURINE 6.0 06/20/2022 0847   GLUCOSEU NEGATIVE 06/20/2022 0847   HGBUR MODERATE (A) 06/20/2022 0847   BILIRUBINUR NEGATIVE 06/20/2022 0847   KETONESUR 80 (A) 06/20/2022 0847   PROTEINUR 100 (A) 06/20/2022 0847   UROBILINOGEN 0.2 12/13/2012 1517   NITRITE NEGATIVE 06/20/2022 0847   LEUKOCYTESUR NEGATIVE 06/20/2022 0847   Sepsis Labs Recent Labs  Lab 06/20/22 0912 06/21/22 0134 06/22/22 0319 06/23/22 0452  WBC 6.2 6.2 5.1 3.9*   Microbiology Recent Results (from the past 240 hour(s))  Culture, blood (Routine x 2)     Status: None (Preliminary result)   Collection Time: 06/20/22  9:12 AM   Specimen: BLOOD  Result Value Ref Range Status   Specimen Description   Final    BLOOD LEFT ANTECUBITAL Performed at [facility], [place] [address] [place], [place] [address]    Special Requests   Final    BOTTLES DRAWN AEROBIC ONLY Blood Culture results may not be optimal due to an inadequate volume of  blood received in culture bottles Performed at [facility], [place] [address], [place], [place] [address]    Culture   Final    NO GROWTH 3 DAYS Performed at [facility], [place] [address], [place], [place] [address]    Report Status PENDING  Incomplete  Culture, blood (Routine x 2)     Status: None (Preliminary result)   Collection Time: 06/20/22  9:31 AM   Specimen: BLOOD  Result Value Ref Range Status   Specimen Description   Final    BLOOD RIGHT  ANTECUBITAL Performed at [facility] [address], [place], [place] [address]    Special Requests   Final    BOTTLES DRAWN AEROBIC AND ANAEROBIC Blood Culture results may not be optimal due to an inadequate volume of blood received in culture bottles Performed at [facility] [address], [place], [place] [address]    Culture   Final    NO GROWTH 3 DAYS Performed at [facility], [place] [address], [place], [place] [address]    Report Status PENDING  Incomplete  MRSA Next Gen by PCR, Nasal     Status: None   Collection Time: 06/20/22  4:46 PM   Specimen: Nasal Mucosa; Nasal Swab  Result Value Ref Range Status   MRSA by PCR Next Gen NOT DETECTED NOT DETECTED Final    Comment: (NOTE) The GeneXpert MRSA Assay (FDA approved for NASAL specimens only), is one component of a comprehensive MRSA colonization surveillance program. It is not intended to diagnose MRSA infection nor to guide or monitor treatment for MRSA infections. Test performance is not FDA approved in patients less than 69 years old. Performed at [facility], [place] [address], [place], [place] [address]      Time coordinating discharge: 53mns  SIGNED:   JKathie Dike MD  [facility] 06/23/2022, 3:02 PM   If 7PM-7AM, please contact night-coverage [email or web address]

## 2022-06-23 NOTE — Plan of Care (Signed)
  Problem: Education: Goal: Knowledge of General Education information will improve Description: Including pain rating scale, medication(s)/side effects and non-pharmacologic comfort measures Outcome: Progressing   Problem: Health Behavior/Discharge Planning: Goal: Ability to manage health-related needs will improve Outcome: Progressing   Problem: Clinical Measurements: Goal: Ability to maintain clinical measurements within normal limits will improve Outcome: Progressing Goal: Will remain free from infection Outcome: Progressing Goal: Diagnostic test results will improve Outcome: Progressing Goal: Respiratory complications will improve Outcome: Progressing Goal: Cardiovascular complication will be avoided Outcome: Progressing   Problem: Activity: Goal: Risk for activity intolerance will decrease Outcome: Progressing   Problem: Nutrition: Goal: Adequate nutrition will be maintained Outcome: Progressing   Problem: Coping: Goal: Level of anxiety will decrease Outcome: Progressing   Problem: Elimination: Goal: Will not experience complications related to bowel motility Outcome: Progressing Goal: Will not experience complications related to urinary retention Outcome: Progressing   Problem: Pain Managment: Goal: General experience of comfort will improve Outcome: Progressing   Problem: Safety: Goal: Ability to remain free from injury will improve Outcome: Progressing   Problem: Skin Integrity: Goal: Risk for impaired skin integrity will decrease Outcome: Progressing   Problem: Education: Goal: Knowledge of disease or condition will improve Outcome: Progressing Goal: Understanding of discharge needs will improve Outcome: Progressing   Problem: Health Behavior/Discharge Planning: Goal: Ability to identify changes in lifestyle to reduce recurrence of condition will improve Outcome: Progressing Goal: Identification of resources available to assist in meeting health  care needs will improve Outcome: Progressing   Problem: Physical Regulation: Goal: Complications related to the disease process, condition or treatment will be avoided or minimized Outcome: Progressing   Problem: Safety: Goal: Ability to remain free from injury will improve Outcome: Progressing   Problem: Education: Goal: Ability to describe self-care measures that may prevent or decrease complications (Diabetes Survival Skills Education) will improve Outcome: Progressing Goal: Individualized Educational Video(s) Outcome: Progressing   Problem: Coping: Goal: Ability to adjust to condition or change in health will improve Outcome: Progressing   Problem: Fluid Volume: Goal: Ability to maintain a balanced intake and output will improve Outcome: Progressing   Problem: Health Behavior/Discharge Planning: Goal: Ability to identify and utilize available resources and services will improve Outcome: Progressing Goal: Ability to manage health-related needs will improve Outcome: Progressing   Problem: Metabolic: Goal: Ability to maintain appropriate glucose levels will improve Outcome: Progressing   Problem: Nutritional: Goal: Maintenance of adequate nutrition will improve Outcome: Progressing Goal: Progress toward achieving an optimal weight will improve Outcome: Progressing   Problem: Skin Integrity: Goal: Risk for impaired skin integrity will decrease Outcome: Progressing   Problem: Tissue Perfusion: Goal: Adequacy of tissue perfusion will improve Outcome: Progressing   

## 2022-06-23 NOTE — Progress Notes (Addendum)
Date and time results received: 06/23/22 0624 (use smartphrase ".now" to insert current time)  Test: CBC Critical Value: WBC = 3.9; K+ = 2.6  Name of Provider Notified: J.Daniels, NP  Orders Received? Or Actions Taken?: waiting for new order waiting for new order

## 2022-06-25 LAB — CULTURE, BLOOD (ROUTINE X 2)
Culture: NO GROWTH
Culture: NO GROWTH

## 2022-06-30 ENCOUNTER — Other Ambulatory Visit: Payer: BC Managed Care – PPO

## 2022-06-30 DIAGNOSIS — E876 Hypokalemia: Secondary | ICD-10-CM

## 2022-06-30 DIAGNOSIS — M6281 Muscle weakness (generalized): Secondary | ICD-10-CM

## 2022-06-30 DIAGNOSIS — I10 Essential (primary) hypertension: Secondary | ICD-10-CM

## 2022-07-01 LAB — COMPREHENSIVE METABOLIC PANEL
AG Ratio: 1.1 (calc) (ref 1.0–2.5)
ALT: 29 U/L (ref 6–29)
AST: 35 U/L — ABNORMAL HIGH (ref 10–30)
Albumin: 3.7 g/dL (ref 3.6–5.1)
Alkaline phosphatase (APISO): 133 U/L — ABNORMAL HIGH (ref 31–125)
BUN: 12 mg/dL (ref 7–25)
CO2: 24 mmol/L (ref 20–32)
Calcium: 9.6 mg/dL (ref 8.6–10.2)
Chloride: 106 mmol/L (ref 98–110)
Creat: 0.71 mg/dL (ref 0.50–0.99)
Globulin: 3.3 g/dL (calc) (ref 1.9–3.7)
Glucose, Bld: 104 mg/dL — ABNORMAL HIGH (ref 65–99)
Potassium: 4.9 mmol/L (ref 3.5–5.3)
Sodium: 137 mmol/L (ref 135–146)
Total Bilirubin: 0.6 mg/dL (ref 0.2–1.2)
Total Protein: 7 g/dL (ref 6.1–8.1)

## 2022-07-01 LAB — CBC WITH DIFFERENTIAL/PLATELET
Absolute Monocytes: 715 cells/uL (ref 200–950)
Basophils Absolute: 39 cells/uL (ref 0–200)
Basophils Relative: 0.8 %
Eosinophils Absolute: 29 cells/uL (ref 15–500)
Eosinophils Relative: 0.6 %
HCT: 28.2 % — ABNORMAL LOW (ref 35.0–45.0)
Hemoglobin: 9.4 g/dL — ABNORMAL LOW (ref 11.7–15.5)
Lymphs Abs: 2313 cells/uL (ref 850–3900)
MCH: 33.6 pg — ABNORMAL HIGH (ref 27.0–33.0)
MCHC: 33.3 g/dL (ref 32.0–36.0)
MCV: 100.7 fL — ABNORMAL HIGH (ref 80.0–100.0)
MPV: 11.3 fL (ref 7.5–12.5)
Monocytes Relative: 14.6 %
Neutro Abs: 1803 cells/uL (ref 1500–7800)
Neutrophils Relative %: 36.8 %
Platelets: 394 10*3/uL (ref 140–400)
RBC: 2.8 10*6/uL — ABNORMAL LOW (ref 3.80–5.10)
RDW: 12.2 % (ref 11.0–15.0)
Total Lymphocyte: 47.2 %
WBC: 4.9 10*3/uL (ref 3.8–10.8)

## 2022-07-01 LAB — MAGNESIUM: Magnesium: 2 mg/dL (ref 1.5–2.5)

## 2022-07-04 DIAGNOSIS — Z419 Encounter for procedure for purposes other than remedying health state, unspecified: Secondary | ICD-10-CM | POA: Diagnosis not present

## 2022-07-06 ENCOUNTER — Ambulatory Visit (INDEPENDENT_AMBULATORY_CARE_PROVIDER_SITE_OTHER): Payer: BC Managed Care – PPO | Admitting: Family Medicine

## 2022-07-06 VITALS — BP 100/78 | HR 93 | Temp 98.6°F | Ht 68.0 in | Wt 113.0 lb

## 2022-07-06 DIAGNOSIS — E8729 Other acidosis: Secondary | ICD-10-CM

## 2022-07-06 DIAGNOSIS — K8689 Other specified diseases of pancreas: Secondary | ICD-10-CM | POA: Diagnosis not present

## 2022-07-06 DIAGNOSIS — E876 Hypokalemia: Secondary | ICD-10-CM

## 2022-07-06 MED ORDER — PANCRELIPASE (LIP-PROT-AMYL) 36000-114000 UNITS PO CPEP: ORAL_CAPSULE | ORAL | 11 refills | Status: DC

## 2022-07-06 NOTE — Progress Notes (Signed)
Subjective:    Patient ID: Samantha Rosales, female    DOB: 04-19-1981, 41 y.o.   MRN: [id number]  HPI  41 year old female with a history of rheumatoid arthritis, alcohol use, admitted to the hospital with nausea and vomiting, abdominal pain.  Noted to have severe metabolic acidosis felt to be related to alcoholic ketoacidosis.  She started on aggressive IV fluid resuscitation.  Noted to have significant electrolyte abnormalities including hypokalemia and hypophosphatemia.   Discharge Diagnoses:  Principal Problem:   Metabolic acidosis Active Problems:   Hypokalemia   Alcohol abuse   Essential hypertension   Abnormal LFTs   Alcoholic ketoacidosis   Protein-calorie malnutrition, severe   Metabolic acidosis Alcoholic ketoacidosis -Treated with dextrose infusion/aggressive hydration -Beta hydroxybutyric acid elevated on admission, but has since trended down -A1c noted to be 4.9 -Overall serum bicarbonate has improved -She was placed on insulin infusion, blood sugars were not significantly elevated -insulin infusion subsequently discontinued -blood sugars have been variable, mostly controlled   Hyperglycemia, possibly new onset type 2 diabetes -She is noted to have elevated fasting blood sugars.  Her fasting sugar on 7/21 was 185, on 7/28 was 132.  This would give her a diagnosis of diabetes, although interestingly her A1c was noted to be 4.9. -I am not aware if she has any underlying hemoglobinopathy that would contribute to falsely low A1c -Blood sugars are not significantly elevated requiring urgent treatment at this point -Patient plans on monitoring her blood sugars at home, she will be given a prescription for glucometer -She will plan on discussing further management with her primary care physician when she follows up in the next 1 to 2 weeks -Reluctant to start on medications at this time with her A1c being so low and fasting blood sugars being only mildly elevated -Would  recommend that further work-up/discussion be had by PCP with patient.   Hypokalemia -Replace   Hypophosphatemia -Replace   ETOH use -seen by TOC and offered SA resources -no signs of withdrawal at present   Thrombocytopenia -Suspect is related to alcohol consumption -Continue to monitor   Hypertension -Continue bystolic -also started on losartan   Rheumatoid arthritis -On chronic Humira therapy   Elevated LFTs -Likely related to alcohol -Appears to be improving  07/06/22 Patient is here today for follow-up.  She is adamant that she was not drinking prior to her hospital admission.  She states that she is drinking 1 or 2 beers a day and no heavy liquor.  She states that she is not eating well.  She states as soon as she eats, she has to run to the bathroom to have diarrhea.  She certainly appears malnourished, frail, and cachectic.  She denies any abdominal pain but she does have a history of pancreatitis.  She does admit that she is stopped all of her blood pressure medicine, potassium, and magnesium prior to going to the hospital.  Past Medical History:  Diagnosis Date   Abnormal Pap smear    Alcohol use    Allergic rhinitis    BV (bacterial vaginosis)    GERD (gastroesophageal reflux disease)    Hepatitis    LFT's >1000 (2019) due to methotrexate vs alcohol   High cholesterol    HSV-2 (herpes simplex virus 2) infection    Hypertension    Hypothyroidism 10/17/2019   Pancreatitis    12/19 (thought to be due to alcohol)   Rheumatoid arthritis (HCC)    Seizures ([place])    dx as child with  epilepsy but no seizures since 99 and on no meds   Vaginal Pap smear, abnormal    Past Surgical History:  Procedure Laterality Date   CERVICAL CONIZATION W/BX  12/20/2012   Procedure: CONIZATION CERVIX WITH BIOPSY;  Surgeon: Florian Buff, MD;  Location: [facility];  Service: Gynecology;  Laterality: N/A;   HOLMIUM LASER APPLICATION  8/50/2774   Procedure: HOLMIUM LASER APPLICATION;   Surgeon: Florian Buff, MD;  Location: [facility];  Service: Gynecology;  Laterality: N/A;   Current Outpatient Medications on File Prior to Visit  Medication Sig Dispense Refill   Acetaminophen (TYLENOL PO) Take 2 tablets by mouth every 6 (six) hours as needed (pain/headache).     Adalimumab (HUMIRA) 40 MG/0.4ML PSKT Inject 40 mg into the skin every 14 (fourteen) days.     BIOTIN PO Take 1 tablet by mouth daily.     blood glucose meter kit and supplies KIT Dispense based on patient and insurance preference. Use up to four times daily as directed. 1 each 0   CALCIUM PO Take 1 tablet by mouth daily.     Cholecalciferol (VITAMIN D3 PO) Take 1 tablet by mouth daily.     cycloSPORINE (RESTASIS) 0.05 % ophthalmic emulsion Place 1 drop into both eyes 2 (two) times daily as needed (watery eyes/irritation).     escitalopram (LEXAPRO) 10 MG tablet Take 1 tablet (10 mg total) by mouth daily. 90 tablet 3   folic acid (FOLVITE) 1 MG tablet Take 1 mg by mouth daily.     losartan (COZAAR) 50 MG tablet Take 1 tablet (50 mg total) by mouth daily. 30 tablet 0   MAGNESIUM PO Take 1 tablet by mouth daily.     nebivolol (BYSTOLIC) 10 MG tablet Take 1 tablet (10 mg total) by mouth daily. 90 tablet 3   thiamine 100 MG tablet Take 100 mg by mouth 2 (two) times daily.     potassium chloride SA (KLOR-CON M) 20 MEQ tablet Take 2 tablets (40 mEq total) by mouth daily. (Patient not taking: Reported on 06/21/2022) 60 tablet 11   No current facility-administered medications on file prior to visit.   Allergies  Allergen Reactions   Penicillins Hives    Has patient had a PCN reaction causing immediate rash, facial/tongue/throat swelling, SOB or lightheadedness with hypotension: Unknown Has patient had a PCN reaction causing severe rash involving mucus membranes or skin necrosis: Unknown Has patient had a PCN reaction that required hospitalization: Unknown Has patient had a PCN reaction occurring within the last 10 years:  No If all of the above answers are "NO", then may proceed with Cephalosporin use.    Social History   Socioeconomic History   Marital status: Married    Spouse name: Not on file   Number of children: 2   Years of education: Not on file   Highest education level: Not on file  Occupational History   Occupation: [employer]    Employer: [employer]   Tobacco Use   Smoking status: Every Day    Packs/day: 0.50    Years: 15.00    Total pack years: 7.50    Types: Cigarettes   Smokeless tobacco: Never  Vaping Use   Vaping Use: Never used  Substance and Sexual Activity   Alcohol use: Yes    Alcohol/week: 28.0 standard drinks of alcohol    Types: 28 Standard drinks or equivalent per week    Comment: 4-6 drinks per day depending what theyre doing.  Drug use: No   Sexual activity: Yes    Birth control/protection: Injection    Comment: Depo Provera every 3 months  Other Topics Concern   Not on file  Social History Narrative   Not on file   Social Determinants of Health   Financial Resource Strain: Not on file  Food Insecurity: Not on file  Transportation Needs: Not on file  Physical Activity: Not on file  Stress: Not on file  Social Connections: Not on file  Intimate Partner Violence: Not on file   Family History  Problem Relation Age of Onset   Hypertension Mother    Hypertension Father    Pancreatic cancer Paternal Uncle    Colon cancer Neg Hx    Esophageal cancer Neg Hx    Inflammatory bowel disease Neg Hx    Liver disease Neg Hx    Rectal cancer Neg Hx    Stomach cancer Neg Hx       Review of Systems     Objective:   Physical Exam Constitutional:      General: She is not in acute distress.    Appearance: Normal appearance. She is cachectic. She is not ill-appearing, toxic-appearing or diaphoretic.  Cardiovascular:     Rate and Rhythm: Regular rhythm. Tachycardia present.     Heart sounds: Normal heart sounds. No murmur heard.     No gallop.  Pulmonary:     Effort: Pulmonary effort is normal. No respiratory distress.     Breath sounds: Normal breath sounds. No stridor. No wheezing, rhonchi or rales.  Abdominal:     General: Bowel sounds are normal. There is no distension.     Palpations: Abdomen is soft.     Tenderness: There is no abdominal tenderness. There is no guarding.  Musculoskeletal:     Cervical back: Neck supple. No tenderness.  Skin:    Findings: No rash.  Neurological:     General: No focal deficit present.     Mental Status: She is alert and oriented to person, place, and time.     Cranial Nerves: No cranial nerve deficit.     Sensory: No sensory deficit.     Motor: No weakness.     Coordination: Coordination normal.     Gait: Gait normal.  Psychiatric:        Attention and Perception: Attention normal.        Mood and Affect: Mood normal.        Speech: Speech normal.        Behavior: Behavior is cooperative.           Assessment & Plan:  Low blood potassium - Plan: Magnesium, COMPLETE METABOLIC PANEL WITH GFR, Phosphorus  Hypomagnesemia - Plan: Magnesium, COMPLETE METABOLIC PANEL WITH GFR, Phosphorus  Alcoholic ketoacidosis - Plan: Magnesium, COMPLETE METABOLIC PANEL WITH GFR, Phosphorus  Pancreatic insufficiency  I explained to the patient that alcoholic ketoacidosis is a condition essentially due to starvation and excessive alcohol intake.  Patient denies excessive alcohol intake but she does admit that she has not been eating primarily due to the fact that she has diarrhea and stomach upset as soon as she eats.  She has a history of pancreatitis and I suspect that she likely has underlying chronic pancreatitis and may have pancreatic insufficiency.  This could cause a malabsorptive diarrhea that essentially would create a starvation state of malnourishment.  Therefore we will start the patient on Creon 2 tablets with every meal and consult GI.  I  will repeat a CMP today, magnesium  level, and phosphorus level.  I essentially believe that if I can get the patient compliant with her medication, eating a healthy well-balanced diet that she can absorb by using Creon, and avoiding alcohol, we can prevent further hospitalizations.  However at the present time the patient is very malnourished.  If GI does not feel that this is pancreatic insufficiency, I would recommend a CT scan of the abdomen and pelvis to evaluate for any other occult malignancy

## 2022-07-07 LAB — COMPLETE METABOLIC PANEL WITH GFR
AG Ratio: 1.1 (calc) (ref 1.0–2.5)
ALT: 45 U/L — ABNORMAL HIGH (ref 6–29)
AST: 78 U/L — ABNORMAL HIGH (ref 10–30)
Albumin: 3.6 g/dL (ref 3.6–5.1)
Alkaline phosphatase (APISO): 123 U/L (ref 31–125)
BUN: 12 mg/dL (ref 7–25)
CO2: 27 mmol/L (ref 20–32)
Calcium: 9.1 mg/dL (ref 8.6–10.2)
Chloride: 106 mmol/L (ref 98–110)
Creat: 0.57 mg/dL (ref 0.50–0.99)
Globulin: 3.4 g/dL (calc) (ref 1.9–3.7)
Glucose, Bld: 191 mg/dL — ABNORMAL HIGH (ref 65–99)
Potassium: 3.8 mmol/L (ref 3.5–5.3)
Sodium: 144 mmol/L (ref 135–146)
Total Bilirubin: 0.3 mg/dL (ref 0.2–1.2)
Total Protein: 7 g/dL (ref 6.1–8.1)
eGFR: 117 mL/min/{1.73_m2} (ref >=60)

## 2022-07-07 LAB — PHOSPHORUS: Phosphorus: 5.1 mg/dL — ABNORMAL HIGH (ref 2.5–4.5)

## 2022-07-07 LAB — MAGNESIUM: Magnesium: 1.5 mg/dL (ref 1.5–2.5)

## 2022-07-10 ENCOUNTER — Telehealth: Payer: Self-pay

## 2022-07-10 NOTE — Telephone Encounter (Signed)
Spoke with pt, aware of lab results. Pt has been checking her BS levels. However, as right now pt has no income coming, so it's hard for pt to pay her co-pay.   Any advice?

## 2022-08-04 DIAGNOSIS — Z419 Encounter for procedure for purposes other than remedying health state, unspecified: Secondary | ICD-10-CM | POA: Diagnosis not present

## 2022-08-25 ENCOUNTER — Inpatient Hospital Stay (HOSPITAL_COMMUNITY)
Admission: EM | Admit: 2022-08-25 | Discharge: 2022-08-29 | DRG: 640 | Disposition: A | Discharge status: Home / Self Care | Payer: BC Managed Care – PPO | Attending: Internal Medicine | Admitting: Internal Medicine

## 2022-08-25 DIAGNOSIS — E7800 Pure hypercholesterolemia, unspecified: Secondary | ICD-10-CM | POA: Diagnosis present

## 2022-08-25 DIAGNOSIS — D696 Thrombocytopenia, unspecified: Secondary | ICD-10-CM | POA: Diagnosis present

## 2022-08-25 DIAGNOSIS — E870 Hyperosmolality and hypernatremia: Secondary | ICD-10-CM | POA: Diagnosis present

## 2022-08-25 DIAGNOSIS — F17210 Nicotine dependence, cigarettes, uncomplicated: Secondary | ICD-10-CM | POA: Diagnosis present

## 2022-08-25 DIAGNOSIS — K8681 Exocrine pancreatic insufficiency: Secondary | ICD-10-CM | POA: Diagnosis present

## 2022-08-25 DIAGNOSIS — E43 Unspecified severe protein-calorie malnutrition: Secondary | ICD-10-CM | POA: Diagnosis present

## 2022-08-25 DIAGNOSIS — E039 Hypothyroidism, unspecified: Secondary | ICD-10-CM | POA: Diagnosis present

## 2022-08-25 DIAGNOSIS — E876 Hypokalemia: Secondary | ICD-10-CM | POA: Diagnosis present

## 2022-08-25 DIAGNOSIS — R197 Diarrhea, unspecified: Secondary | ICD-10-CM | POA: Diagnosis present

## 2022-08-25 DIAGNOSIS — R569 Unspecified convulsions: Secondary | ICD-10-CM | POA: Diagnosis present

## 2022-08-25 DIAGNOSIS — R7989 Other specified abnormal findings of blood chemistry: Secondary | ICD-10-CM | POA: Diagnosis present

## 2022-08-25 DIAGNOSIS — R109 Unspecified abdominal pain: Secondary | ICD-10-CM | POA: Diagnosis present

## 2022-08-25 DIAGNOSIS — K760 Fatty (change of) liver, not elsewhere classified: Secondary | ICD-10-CM | POA: Diagnosis present

## 2022-08-25 DIAGNOSIS — K759 Inflammatory liver disease, unspecified: Secondary | ICD-10-CM | POA: Diagnosis present

## 2022-08-25 DIAGNOSIS — Z880 Allergy status to penicillin: Secondary | ICD-10-CM

## 2022-08-25 DIAGNOSIS — F1010 Alcohol abuse, uncomplicated: Secondary | ICD-10-CM | POA: Diagnosis present

## 2022-08-25 DIAGNOSIS — K861 Other chronic pancreatitis: Secondary | ICD-10-CM | POA: Diagnosis present

## 2022-08-25 DIAGNOSIS — I10 Essential (primary) hypertension: Secondary | ICD-10-CM | POA: Diagnosis present

## 2022-08-25 DIAGNOSIS — N179 Acute kidney failure, unspecified: Secondary | ICD-10-CM | POA: Diagnosis present

## 2022-08-25 DIAGNOSIS — J309 Allergic rhinitis, unspecified: Secondary | ICD-10-CM | POA: Diagnosis present

## 2022-08-25 DIAGNOSIS — R739 Hyperglycemia, unspecified: Secondary | ICD-10-CM | POA: Diagnosis present

## 2022-08-25 DIAGNOSIS — R823 Hemoglobinuria: Secondary | ICD-10-CM | POA: Diagnosis present

## 2022-08-25 DIAGNOSIS — D638 Anemia in other chronic diseases classified elsewhere: Secondary | ICD-10-CM | POA: Diagnosis present

## 2022-08-25 DIAGNOSIS — E8729 Other acidosis: Secondary | ICD-10-CM | POA: Diagnosis not present

## 2022-08-25 DIAGNOSIS — Z8249 Family history of ischemic heart disease and other diseases of the circulatory system: Secondary | ICD-10-CM

## 2022-08-25 DIAGNOSIS — R824 Acetonuria: Secondary | ICD-10-CM | POA: Diagnosis present

## 2022-08-25 DIAGNOSIS — K219 Gastro-esophageal reflux disease without esophagitis: Secondary | ICD-10-CM | POA: Diagnosis present

## 2022-08-25 DIAGNOSIS — R110 Nausea: Secondary | ICD-10-CM | POA: Diagnosis present

## 2022-08-25 DIAGNOSIS — Z720 Tobacco use: Secondary | ICD-10-CM | POA: Diagnosis present

## 2022-08-25 DIAGNOSIS — R809 Proteinuria, unspecified: Secondary | ICD-10-CM | POA: Diagnosis present

## 2022-08-25 DIAGNOSIS — M069 Rheumatoid arthritis, unspecified: Secondary | ICD-10-CM | POA: Diagnosis present

## 2022-08-25 DIAGNOSIS — R112 Nausea with vomiting, unspecified: Secondary | ICD-10-CM

## 2022-08-25 DIAGNOSIS — A6009 Herpesviral infection of other urogenital tract: Secondary | ICD-10-CM | POA: Diagnosis present

## 2022-08-25 DIAGNOSIS — Z681 Body mass index (BMI) 19 or less, adult: Secondary | ICD-10-CM

## 2022-08-25 DIAGNOSIS — Z79899 Other long term (current) drug therapy: Secondary | ICD-10-CM

## 2022-08-25 NOTE — ED Triage Notes (Signed)
Patient arrived with complaints of abdominal pain with NVD and central chest pain that started Tuesday

## 2022-08-26 ENCOUNTER — Emergency Department (HOSPITAL_COMMUNITY): Payer: BC Managed Care – PPO

## 2022-08-26 ENCOUNTER — Encounter (HOSPITAL_COMMUNITY): Payer: Self-pay

## 2022-08-26 DIAGNOSIS — D696 Thrombocytopenia, unspecified: Secondary | ICD-10-CM | POA: Diagnosis present

## 2022-08-26 DIAGNOSIS — R112 Nausea with vomiting, unspecified: Secondary | ICD-10-CM | POA: Diagnosis present

## 2022-08-26 DIAGNOSIS — R109 Unspecified abdominal pain: Secondary | ICD-10-CM | POA: Diagnosis present

## 2022-08-26 DIAGNOSIS — E8729 Other acidosis: Secondary | ICD-10-CM | POA: Diagnosis not present

## 2022-08-26 DIAGNOSIS — R110 Nausea: Secondary | ICD-10-CM | POA: Diagnosis present

## 2022-08-26 DIAGNOSIS — N179 Acute kidney failure, unspecified: Secondary | ICD-10-CM | POA: Diagnosis present

## 2022-08-26 LAB — CBC WITH DIFFERENTIAL/PLATELET
Abs Immature Granulocytes: 0.04 10*3/uL (ref 0.00–0.07)
Basophils Absolute: 0 10*3/uL (ref 0.0–0.1)
Basophils Relative: 0 %
Eosinophils Absolute: 0.1 10*3/uL (ref 0.0–0.5)
Eosinophils Relative: 1 %
HCT: 42.1 % (ref 36.0–46.0)
Hemoglobin: 13.2 g/dL (ref 12.0–15.0)
Immature Granulocytes: 1 %
Lymphocytes Relative: 9 %
Lymphs Abs: 0.8 10*3/uL (ref 0.7–4.0)
MCH: 32.8 pg (ref 26.0–34.0)
MCHC: 31.4 g/dL (ref 30.0–36.0)
MCV: 104.5 fL — ABNORMAL HIGH (ref 80.0–100.0)
Monocytes Absolute: 0.6 10*3/uL (ref 0.1–1.0)
Monocytes Relative: 6 %
Neutro Abs: 7.1 10*3/uL (ref 1.7–7.7)
Neutrophils Relative %: 83 %
Platelets: 77 10*3/uL — ABNORMAL LOW (ref 150–400)
RBC: 4.03 MIL/uL (ref 3.87–5.11)
RDW: 13.8 % (ref 11.5–15.5)
WBC: 8.5 10*3/uL (ref 4.0–10.5)
nRBC: 0.2 % (ref 0.0–0.2)

## 2022-08-26 LAB — COMPREHENSIVE METABOLIC PANEL
ALT: 45 U/L — ABNORMAL HIGH (ref 0–44)
ALT: 40 U/L (ref 0–44)
AST: 163 U/L — ABNORMAL HIGH (ref 15–41)
AST: 136 U/L — ABNORMAL HIGH (ref 15–41)
Albumin: 4.2 g/dL (ref 3.5–5.0)
Albumin: 4 g/dL (ref 3.5–5.0)
Alkaline Phosphatase: 167 U/L — ABNORMAL HIGH (ref 38–126)
Alkaline Phosphatase: 153 U/L — ABNORMAL HIGH (ref 38–126)
Anion gap: UNDETERMINED (ref 5–15)
Anion gap: 38 — ABNORMAL HIGH (ref 5–15)
BUN: 12 mg/dL (ref 6–20)
BUN: 12 mg/dL (ref 6–20)
CO2: <7 mmol/L — ABNORMAL LOW (ref 22–32)
CO2: 7 mmol/L — ABNORMAL LOW (ref 22–32)
Calcium: 9.1 mg/dL (ref 8.9–10.3)
Calcium: 8.2 mg/dL — ABNORMAL LOW (ref 8.9–10.3)
Chloride: 97 mmol/L — ABNORMAL LOW (ref 98–111)
Chloride: 94 mmol/L — ABNORMAL LOW (ref 98–111)
Creatinine, Ser: 1.71 mg/dL — ABNORMAL HIGH (ref 0.44–1.00)
Creatinine, Ser: 1.59 mg/dL — ABNORMAL HIGH (ref 0.44–1.00)
GFR, Estimated: 38 mL/min — ABNORMAL LOW (ref >60)
GFR, Estimated: 41 mL/min — ABNORMAL LOW (ref >60)
Glucose, Bld: 104 mg/dL — ABNORMAL HIGH (ref 70–99)
Glucose, Bld: 146 mg/dL — ABNORMAL HIGH (ref 70–99)
Potassium: 3.2 mmol/L — ABNORMAL LOW (ref 3.5–5.1)
Potassium: 4.4 mmol/L (ref 3.5–5.1)
Sodium: 148 mmol/L — ABNORMAL HIGH (ref 135–145)
Sodium: 139 mmol/L (ref 135–145)
Total Bilirubin: 1.9 mg/dL — ABNORMAL HIGH (ref 0.3–1.2)
Total Bilirubin: 3.4 mg/dL — ABNORMAL HIGH (ref 0.3–1.2)
Total Protein: 8.9 g/dL — ABNORMAL HIGH (ref 6.5–8.1)
Total Protein: 8.3 g/dL — ABNORMAL HIGH (ref 6.5–8.1)

## 2022-08-26 LAB — URINALYSIS, ROUTINE W REFLEX MICROSCOPIC
Bacteria, UA: NONE SEEN
Bilirubin Urine: NEGATIVE
Glucose, UA: NEGATIVE mg/dL
Ketones, ur: 80 mg/dL — AB
Leukocytes,Ua: NEGATIVE
Nitrite: NEGATIVE
Protein, ur: 100 mg/dL — AB
Specific Gravity, Urine: 1.012 (ref 1.005–1.030)
pH: 6 (ref 5.0–8.0)

## 2022-08-26 LAB — MAGNESIUM: Magnesium: 1.5 mg/dL — ABNORMAL LOW (ref 1.7–2.4)

## 2022-08-26 LAB — BASIC METABOLIC PANEL
Anion gap: 16 — ABNORMAL HIGH (ref 5–15)
Anion gap: 14 (ref 5–15)
BUN: 8 mg/dL (ref 6–20)
BUN: 8 mg/dL (ref 6–20)
CO2: 14 mmol/L — ABNORMAL LOW (ref 22–32)
CO2: 16 mmol/L — ABNORMAL LOW (ref 22–32)
Calcium: 7.8 mg/dL — ABNORMAL LOW (ref 8.9–10.3)
Calcium: 7.9 mg/dL — ABNORMAL LOW (ref 8.9–10.3)
Chloride: 104 mmol/L (ref 98–111)
Chloride: 103 mmol/L (ref 98–111)
Creatinine, Ser: 1.04 mg/dL — ABNORMAL HIGH (ref 0.44–1.00)
Creatinine, Ser: 0.93 mg/dL (ref 0.44–1.00)
GFR, Estimated: >60 mL/min (ref >60)
GFR, Estimated: >60 mL/min (ref >60)
Glucose, Bld: 434 mg/dL — ABNORMAL HIGH (ref 70–99)
Glucose, Bld: 392 mg/dL — ABNORMAL HIGH (ref 70–99)
Potassium: 3.4 mmol/L — ABNORMAL LOW (ref 3.5–5.1)
Potassium: 2.9 mmol/L — ABNORMAL LOW (ref 3.5–5.1)
Sodium: 134 mmol/L — ABNORMAL LOW (ref 135–145)
Sodium: 133 mmol/L — ABNORMAL LOW (ref 135–145)

## 2022-08-26 LAB — BLOOD GAS, VENOUS
Acid-base deficit: 19.8 mmol/L — ABNORMAL HIGH (ref 0.0–2.0)
Acid-base deficit: 9.5 mmol/L — ABNORMAL HIGH (ref 0.0–2.0)
Bicarbonate: 6.6 mmol/L — ABNORMAL LOW (ref 20.0–28.0)
Bicarbonate: 14.4 mmol/L — ABNORMAL LOW (ref 20.0–28.0)
O2 Saturation: 52.1 %
O2 Saturation: 98 %
Patient temperature: 37
Patient temperature: 37
pCO2, Ven: 18 mmHg — CL (ref 44–60)
pCO2, Ven: 26 mmHg — ABNORMAL LOW (ref 44–60)
pH, Ven: 7.17 — CL (ref 7.25–7.43)
pH, Ven: 7.35 (ref 7.25–7.43)
pO2, Ven: 35 mmHg (ref 32–45)
pO2, Ven: 82 mmHg — ABNORMAL HIGH (ref 32–45)

## 2022-08-26 LAB — CBC
HCT: 45 % (ref 36.0–46.0)
Hemoglobin: 13.6 g/dL (ref 12.0–15.0)
MCH: 33.5 pg (ref 26.0–34.0)
MCHC: 30.2 g/dL (ref 30.0–36.0)
MCV: 110.8 fL — ABNORMAL HIGH (ref 80.0–100.0)
Platelets: 96 10*3/uL — ABNORMAL LOW (ref 150–400)
RBC: 4.06 MIL/uL (ref 3.87–5.11)
RDW: 14 % (ref 11.5–15.5)
WBC: 8.7 10*3/uL (ref 4.0–10.5)
nRBC: 0.2 % (ref 0.0–0.2)

## 2022-08-26 LAB — GLUCOSE, CAPILLARY
Glucose-Capillary: 433 mg/dL — ABNORMAL HIGH (ref 70–99)
Glucose-Capillary: 365 mg/dL — ABNORMAL HIGH (ref 70–99)
Glucose-Capillary: 357 mg/dL — ABNORMAL HIGH (ref 70–99)
Glucose-Capillary: 279 mg/dL — ABNORMAL HIGH (ref 70–99)
Glucose-Capillary: 201 mg/dL — ABNORMAL HIGH (ref 70–99)
Glucose-Capillary: 195 mg/dL — ABNORMAL HIGH (ref 70–99)
Glucose-Capillary: 173 mg/dL — ABNORMAL HIGH (ref 70–99)
Glucose-Capillary: 169 mg/dL — ABNORMAL HIGH (ref 70–99)

## 2022-08-26 LAB — LACTIC ACID, PLASMA: Lactic Acid, Venous: 2 mmol/L (ref 0.5–1.9)

## 2022-08-26 LAB — BETA-HYDROXYBUTYRIC ACID: Beta-Hydroxybutyric Acid: 7.6 mmol/L — ABNORMAL HIGH (ref 0.05–0.27)

## 2022-08-26 LAB — ETHANOL: Alcohol, Ethyl (B): 34 mg/dL — ABNORMAL HIGH (ref <10)

## 2022-08-26 LAB — LIPASE, BLOOD: Lipase: 47 U/L (ref 11–51)

## 2022-08-26 LAB — MRSA NEXT GEN BY PCR, NASAL: MRSA by PCR Next Gen: NOT DETECTED

## 2022-08-26 LAB — PHOSPHORUS
Phosphorus: 7.3 mg/dL — ABNORMAL HIGH (ref 2.5–4.6)
Phosphorus: 1.3 mg/dL — ABNORMAL LOW (ref 2.5–4.6)

## 2022-08-26 LAB — I-STAT BETA HCG BLOOD, ED (MC, WL, AP ONLY): I-stat hCG, quantitative: <5 m[IU]/mL (ref <5)

## 2022-08-26 LAB — TROPONIN I (HIGH SENSITIVITY)
Troponin I (High Sensitivity): 5 ng/L (ref <18)
Troponin I (High Sensitivity): 7 ng/L (ref <18)

## 2022-08-26 LAB — HEMOGLOBIN A1C
Hgb A1c MFr Bld: 4.3 % — ABNORMAL LOW (ref 4.8–5.6)
Mean Plasma Glucose: 76.71 mg/dL

## 2022-08-26 MED ORDER — THIAMINE HCL 100 MG/ML IJ SOLN
100.0000 mg | Freq: Once | INTRAMUSCULAR | Status: AC
  Administered 2022-08-26: 100 mg via INTRAVENOUS
  Filled 2022-08-26: qty 2

## 2022-08-26 MED ORDER — DEXTROSE 5 % AND 0.45 % NACL IV BOLUS
1000.0000 mL | Freq: Once | INTRAVENOUS | Status: AC
  Administered 2022-08-26: 1000 mL via INTRAVENOUS

## 2022-08-26 MED ORDER — LORAZEPAM 1 MG PO TABS: 0.0000 mg | ORAL_TABLET | ORAL | Status: AC

## 2022-08-26 MED ORDER — LIP MEDEX EX OINT
TOPICAL_OINTMENT | CUTANEOUS | Status: DC | PRN
  Administered 2022-08-26: 1 via TOPICAL
  Filled 2022-08-26: qty 7

## 2022-08-26 MED ORDER — LORAZEPAM 1 MG PO TABS
0.0000 mg | ORAL_TABLET | Freq: Three times a day (TID) | ORAL | Status: DC
  Filled 2022-08-26: qty 1

## 2022-08-26 MED ORDER — MAGNESIUM SULFATE 2 GM/50ML IV SOLN
2.0000 g | Freq: Once | INTRAVENOUS | Status: AC
  Administered 2022-08-26: 2 g via INTRAVENOUS
  Filled 2022-08-26: qty 50

## 2022-08-26 MED ORDER — SODIUM CHLORIDE 0.9 % IV BOLUS
1000.0000 mL | INTRAVENOUS | Status: AC
  Administered 2022-08-26: 1000 mL via INTRAVENOUS

## 2022-08-26 MED ORDER — FENTANYL CITRATE PF 50 MCG/ML IJ SOSY: 50.0000 ug | PREFILLED_SYRINGE | INTRAMUSCULAR | Status: DC | PRN

## 2022-08-26 MED ORDER — LACTATED RINGERS IV SOLN: INTRAVENOUS | Status: DC

## 2022-08-26 MED ORDER — ORAL CARE MOUTH RINSE: 15.0000 mL | OROMUCOSAL | Status: DC | PRN

## 2022-08-26 MED ORDER — ONDANSETRON HCL 4 MG/2ML IJ SOLN
4.0000 mg | Freq: Once | INTRAMUSCULAR | Status: AC
Start: 2022-08-26 — End: 2022-08-26
  Administered 2022-08-26: 4 mg via INTRAVENOUS
  Filled 2022-08-26: qty 2

## 2022-08-26 MED ORDER — THIAMINE HCL 100 MG/ML IJ SOLN
100.0000 mg | Freq: Every day | INTRAMUSCULAR | Status: DC
  Filled 2022-08-26: qty 2

## 2022-08-26 MED ORDER — ACETAMINOPHEN 325 MG PO TABS: 650.0000 mg | ORAL_TABLET | Freq: Four times a day (QID) | ORAL | Status: DC | PRN

## 2022-08-26 MED ORDER — FAMOTIDINE IN NACL 20-0.9 MG/50ML-% IV SOLN
20.0000 mg | Freq: Once | INTRAVENOUS | Status: AC
  Administered 2022-08-26: 20 mg via INTRAVENOUS
  Filled 2022-08-26: qty 50

## 2022-08-26 MED ORDER — SODIUM CHLORIDE 0.9 % IV SOLN: INTRAVENOUS | Status: DC

## 2022-08-26 MED ORDER — ACETAMINOPHEN 650 MG RE SUPP: 650.0000 mg | Freq: Four times a day (QID) | RECTAL | Status: DC | PRN

## 2022-08-26 MED ORDER — FOLIC ACID 1 MG PO TABS
1.0000 mg | ORAL_TABLET | Freq: Every day | ORAL | Status: DC
  Administered 2022-08-26 – 2022-08-29 (×4): 1 mg via ORAL
  Filled 2022-08-26 (×4): qty 1

## 2022-08-26 MED ORDER — CHLORHEXIDINE GLUCONATE CLOTH 2 % EX PADS
6.0000 | MEDICATED_PAD | Freq: Every day | CUTANEOUS | Status: DC
  Administered 2022-08-26: 6 via TOPICAL

## 2022-08-26 MED ORDER — NICOTINE 14 MG/24HR TD PT24
14.0000 mg | MEDICATED_PATCH | Freq: Every day | TRANSDERMAL | Status: DC
  Administered 2022-08-26: 14 mg via TRANSDERMAL
  Filled 2022-08-26 (×4): qty 1

## 2022-08-26 MED ORDER — NALOXONE HCL 0.4 MG/ML IJ SOLN: 0.4000 mg | INTRAMUSCULAR | Status: DC | PRN

## 2022-08-26 MED ORDER — INSULIN REGULAR(HUMAN) IN NACL 100-0.9 UT/100ML-% IV SOLN
INTRAVENOUS | Status: AC
  Administered 2022-08-26: 3.2 [IU]/h via INTRAVENOUS
  Filled 2022-08-26: qty 100

## 2022-08-26 MED ORDER — LORAZEPAM 1 MG PO TABS: 1.0000 mg | ORAL_TABLET | ORAL | Status: AC | PRN

## 2022-08-26 MED ORDER — LORAZEPAM 2 MG/ML IJ SOLN: 1.0000 mg | INTRAMUSCULAR | Status: AC | PRN

## 2022-08-26 MED ORDER — DEXTROSE-NACL 5-0.9 % IV SOLN: INTRAVENOUS | Status: DC

## 2022-08-26 MED ORDER — DEXTROSE 50 % IV SOLN: 0.0000 mL | INTRAVENOUS | Status: DC | PRN

## 2022-08-26 MED ORDER — DEXTROSE IN LACTATED RINGERS 5 % IV SOLN: INTRAVENOUS | Status: DC

## 2022-08-26 MED ORDER — ADULT MULTIVITAMIN W/MINERALS CH
1.0000 | ORAL_TABLET | Freq: Every day | ORAL | Status: DC
  Administered 2022-08-26 – 2022-08-29 (×4): 1 via ORAL
  Filled 2022-08-26 (×4): qty 1

## 2022-08-26 MED ORDER — ONDANSETRON HCL 4 MG/2ML IJ SOLN: 4.0000 mg | Freq: Four times a day (QID) | INTRAMUSCULAR | Status: DC | PRN

## 2022-08-26 MED ORDER — THIAMINE MONONITRATE 100 MG PO TABS
100.0000 mg | ORAL_TABLET | Freq: Every day | ORAL | Status: DC
  Administered 2022-08-26 – 2022-08-29 (×4): 100 mg via ORAL
  Filled 2022-08-26 (×4): qty 1

## 2022-08-26 MED ORDER — DEXTROSE-NACL 5-0.9 % IV SOLN: Freq: Once | INTRAVENOUS | Status: AC

## 2022-08-26 NOTE — ED Notes (Signed)
Pt assisted into WC. Wheels locked.

## 2022-08-26 NOTE — Progress Notes (Signed)
[facility], [facility] admitting physician addendum:  The patient's blood glucose has increased to the 500s.  She was initially admitted for alcoholic ketoacidosis after not eating for the past 3 days.  She has a history of chronic pancreatitis.  Insulin infusion per Endo tool started.  We will obtain BMP, VBG and beta hydroxybutyric acid now.   Tennis Must, MD.

## 2022-08-26 NOTE — Progress Notes (Signed)
  Carryover admission to the Day Admitter.  I discussed this case with the EDP, Dr. Randal Buba.  Per these discussions:   This is a 41 year old female with chronic alcohol abuse, who is being admitted with alcoholic ketoacidosis after presenting with recurrent nausea/vomiting over the last 1 to 2 days, with presenting labs notable for anion gap metabolic acidosis, with anion gap greater than 25, without evidence of DKA, including concomitant blood sugar of 104.  Had similar admission in July 2023.  She notes a history of chronic alcohol abuse, reporting that she typically consumes 2 beers plus several shots of hard liquor on a daily basis, noting most recent alcohol consumption sometime on the morning of 08/25/2022.  In addition to recurrent nausea/vomiting over the last few days, she also notes development of loose stool, as well as some abdominal discomfort, that started with her vomiting.   Presentation also notable for multiple electrolyte abnormalities, including mild hypernatremia, hypokalemia, acute kidney injury.  Presentation also associated with acute kidney injury.  Lipase 47.  Serum ethanol level 34.  Troponin x2 have been nonelevated.  In further evaluating the patient's abdominal discomfort, CT abdomen/pelvis has been ordered, with results currently pending.  Receiving IV fluids.   I have placed an order for inpatient admission to PCU for further evaluation management of the above.  I have placed some additional preliminary admit orders via the adult multi-morbid admission order set.  For now in the setting of her report of intractable nausea/vomiting, I have left her n.p.o.  I have also ordered prn Zofran as well as prn fentanyl.  Ordered morning labs, including CMP, CBC, as well as serum magnesium and phosphorus levels.    Babs Bertin, DO Hospitalist

## 2022-08-26 NOTE — H&P (Signed)
History and Physical    Patient: Samantha Rosales ZNB:[id number] DOB: 02-05-1981 DOA: 08/25/2022 DOS: the patient was seen and examined on 08/26/2022 PCP: [name], MD  Patient coming from: Home  Chief Complaint:  Chief Complaint  Patient presents with   Abdominal Pain   Chest Pain   HPI: AMOR HYLE is a 41 y.o. female with medical history significant of abnormal Pap smear, allergic rhinitis, bacterial vaginosis, GERD, history of hepatitis due to methotrexate or alcohol use, hyperlipidemia, HSV-2 infection, hypertension, hypothyroidism, rheumatoid arthritis, seizures as a child but no episodes since 89, tobacco use, multiple episodes of alcoholic pancreatitis who came into the emergency department complaints of epigastric abdominal pain radiated to her back associated with central chest pain due to multiple episodes of nausea with emesis and also she had multiple episodes of diarrhea.  She has not eaten much in the past 3 days, but has been drinking multiple types of alcoholic beverages.  No fever, chills or night sweats.  Mild sore throat from vomiting, but no rhinorrhea, dyspnea, wheezing or hemoptysis.  No palpitations, diaphoresis, PND, orthopnea or pitting edema of the lower extremities.  No constipation, melena or hematochezia.  No dysuria, frequency or hematuria.  No polyuria, polydipsia, polyphagia or blurred vision.  ED course: Initial vital signs were temperature 98.5 F, pulse 86, respiration 17, BP 85/57 mmHg with an O2 sat of 99% on room air.  The patient received famotidine 20 mg IVP, 2000 mL of normal saline bolus, thiamine 100 mg IVP and 4 mg of ondansetron IVP.  Lab work: Urinalysis was hazy, small hemoglobinuria, ketonuria of 80 and proteinuria 100 mg/dL.  CBC showed her white at count 8.7, hemoglobin 13.6 g/dL with an MCV of 110.8 fL and platelets 96.  Troponin x2 normal.  Lipase was 47.  Sodium 148, potassium 3.2, chloride 97 and CO2 less than 7 mmol/L with an anion  gap of 38.  Glucose 104, BUN 12 and creatinine 1.71 mg deciliter.  Total protein 8.9 and albumin 4.2 g/dL.  AST 163, ALT 45 and alkaline phosphatase 167 units/L.  Total bilirubin was 1.9 mg/dL.  Magnesium was 1.5 and phosphorus 7.3 mg/dL.  Imaging: Portable 1 view chest radiograph with no active disease.  CT renal stone possible terminal ileitis though this is incompletely evaluated with IV contrast.  There is marked hepatic asteatosis.  Diffuse pancreatic parenchymal calcifications compatible with sequela of chronic pancreatitis.  There was marked distention of the stomach.  Bilateral femoral head avascular necrosis.  Please see images and full radiology report for further details.   Review of Systems: As mentioned in the history of present illness. All other systems reviewed and are negative.  Past Medical History:  Diagnosis Date   Abnormal Pap smear    Alcohol use    Allergic rhinitis    BV (bacterial vaginosis)    GERD (gastroesophageal reflux disease)    Hepatitis    LFT's >1000 (2019) due to methotrexate vs alcohol   High cholesterol    HSV-2 (herpes simplex virus 2) infection    Hypertension    Hypothyroidism 10/17/2019   Pancreatitis    12/19 (thought to be due to alcohol)   Rheumatoid arthritis ([place])    Seizures (HCC)    dx as child with epilepsy but no seizures since 99 and on no meds   Vaginal Pap smear, abnormal    Past Surgical History:  Procedure Laterality Date   CERVICAL CONIZATION W/BX  12/20/2012   Procedure: CONIZATION CERVIX  WITH BIOPSY;  Surgeon: Florian Buff, MD;  Location: [facility];  Service: Gynecology;  Laterality: N/A;   HOLMIUM LASER APPLICATION  3/32/9518   Procedure: HOLMIUM LASER APPLICATION;  Surgeon: Florian Buff, MD;  Location: [facility];  Service: Gynecology;  Laterality: N/A;   Social History:  reports that she has been smoking cigarettes. She has a 7.50 pack-year smoking history. She has never used smokeless tobacco. She reports current alcohol use of  about 28.0 standard drinks of alcohol per week. She reports that she does not use drugs.  Allergies  Allergen Reactions   Penicillins Hives    Has patient had a PCN reaction causing immediate rash, facial/tongue/throat swelling, SOB or lightheadedness with hypotension: Unknown Has patient had a PCN reaction causing severe rash involving mucus membranes or skin necrosis: Unknown Has patient had a PCN reaction that required hospitalization: Unknown Has patient had a PCN reaction occurring within the last 10 years: No If all of the above answers are "NO", then may proceed with Cephalosporin use.     Family History  Problem Relation Age of Onset   Hypertension Mother    Hypertension Father    Pancreatic cancer Paternal Uncle    Colon cancer Neg Hx    Esophageal cancer Neg Hx    Inflammatory bowel disease Neg Hx    Liver disease Neg Hx    Rectal cancer Neg Hx    Stomach cancer Neg Hx     Prior to Admission medications   Medication Sig Start Date End Date Taking? Authorizing Provider  Acetaminophen (TYLENOL PO) Take 2 tablets by mouth every 6 (six) hours as needed (pain/headache).    [provider]  Adalimumab (HUMIRA) 40 MG/0.4ML PSKT Inject 40 mg into the skin every 14 (fourteen) days.    [provider]  BIOTIN PO Take 1 tablet by mouth daily.    [provider]  blood glucose meter kit and supplies KIT Dispense based on patient and insurance preference. Use up to four times daily as directed. 06/23/22   [name], MD  CALCIUM PO Take 1 tablet by mouth daily.    [provider]  Cholecalciferol (VITAMIN D3 PO) Take 1 tablet by mouth daily.    [provider]  cycloSPORINE (RESTASIS) 0.05 % ophthalmic emulsion Place 1 drop into both eyes 2 (two) times daily as needed (watery eyes/irritation). 12/31/20   [provider]  escitalopram (LEXAPRO) 10 MG tablet Take 1 tablet (10 mg total) by mouth daily. 02/17/22   [name],  MD  folic acid (FOLVITE) 1 MG tablet Take 1 mg by mouth daily.    [provider]  lipase/protease/amylase (CREON) 36000 UNITS CPEP capsule Take 2 capsules (72,000 Units total) by mouth 3 (three) times daily with meals. May also take 1 capsule (36,000 Units total) as needed (with snacks). 07/06/22   [name], MD  losartan (COZAAR) 50 MG tablet Take 1 tablet (50 mg total) by mouth daily. 06/24/22   [name], MD  MAGNESIUM PO Take 1 tablet by mouth daily.    [provider]  nebivolol (BYSTOLIC) 10 MG tablet Take 1 tablet (10 mg total) by mouth daily. 04/05/21   [name], MD  potassium chloride SA (KLOR-CON M) 20 MEQ tablet Take 2 tablets (40 mEq total) by mouth daily. Patient not taking: Reported on 06/21/2022 04/21/22 05/21/22  [name], MD  thiamine 100 MG tablet Take 100 mg by mouth 2 (two) times daily. 04/18/21  [provider]    Physical Exam: Vitals:   08/26/22 0630 08/26/22 0639 08/26/22 0700 08/26/22 0736  BP: (!) 124/111 (!) 130/95 (!) 139/101 (!) 147/100  Pulse: (!) 104 (!) 103 (!) 108 (!) 118  Resp: (!) 21 19 (!) 24 (!) 23  Temp:    98.4 F (36.9 C)  TempSrc:    Oral  SpO2: 100% 100% 99% 100%   Physical Exam Vitals and nursing note reviewed. Exam conducted with a chaperone present.  Constitutional:      General: She is awake. She is not in acute distress.    Appearance: She is well-developed. She is ill-appearing. She is not diaphoretic.  HENT:     Head: Normocephalic.     Nose: No rhinorrhea.     Mouth/Throat:     Mouth: Mucous membranes are dry.  Eyes:     General: Scleral icterus present.     Pupils: Pupils are equal, round, and reactive to light.  Neck:     Vascular: No JVD.  Cardiovascular:     Rate and Rhythm: Regular rhythm. Tachycardia present.     Heart sounds: S1 normal and S2 normal.  Pulmonary:     Effort: Pulmonary effort is normal.     Breath sounds: Normal breath sounds. No wheezing or rales.   Abdominal:     General: Bowel sounds are normal. There is no distension.     Palpations: Abdomen is soft.     Tenderness: There is abdominal tenderness in the epigastric area. There is no right CVA tenderness, left CVA tenderness, guarding or rebound.  Musculoskeletal:     Cervical back: Neck supple.     Right lower leg: No edema.     Left lower leg: No edema.  Skin:    General: Skin is warm and dry.  Neurological:     General: No focal deficit present.     Mental Status: She is alert and oriented to person, place, and time.  Psychiatric:        Mood and Affect: Mood is anxious.        Behavior: Behavior normal.   Data Reviewed:  Results are pending, will review when available.  Assessment and Plan: Principal Problem:   Abdominal pain   Nausea and vomiting In the setting of:   Alcoholic ketoacidosis Feels better after fluids, symptoms treatment. Observation/stepdown. Clear liquid diet. Advance diet as tolerated. Continue D5 NS at 125 mL/h. BMP every 4 hours. BHA every 8 hours. Replace electrolytes as needed. Check CBG every 4 hours in between BMP measurements.  Active Problems:   AKI (acute kidney injury) ([place]) Observation/telemetry. Continue IV fluids. Hold ARB. Avoid hypotension. Avoid nephrotoxins. Monitor intake and output. Monitor renal function electrolytes.    Hypomagnesemia Magnesium sulfate 2 g IVPB.    Hyperphosphatemia In the setting of AKI/metabolic acidosis. Continue IV fluids. Follow-up phosphorus level.    Alcohol abuse Begin CIWA protocol with lorazepam. Folate, MVI and thiamine supplementation. Consult transitional care team. Patient was advised to seek counseling for EtOH as an outpatient.    Essential hypertension Losartan held due to AKI. Lopressor 5 mg IVP x1 given earlier. Resume nebivolol 10 mg daily once patient tolerating diet.    Abnormal LFTs Alcohol cessation advised. Follow-up LFTs in AM.    Tobacco abuse Nicotine  replacement therapy ordered. Tobacco cessation advised.    Anemia, chronic disease In the setting of alcohol abuse. Monitor hematocrit and hemoglobin.    Thrombocytopenia ([place]) Secondary to ethanol consumption. Monitor  platelet count.     Advance Care Planning:   Code Status: Full Code   Consults:   Family Communication:   Severity of Illness: The appropriate patient status for this patient is OBSERVATION. Observation status is judged to be reasonable and necessary in order to provide the required intensity of service to ensure the patient's safety. The patient's presenting symptoms, physical exam findings, and initial radiographic and laboratory data in the context of their medical condition is felt to place them at decreased risk for further clinical deterioration. Furthermore, it is anticipated that the patient will be medically stable for discharge from the hospital within 2 midnights of admission.   Author: Reubin Milan, MD 08/26/2022 8:06 AM  For on call review [email or web address].   This document was prepared using Dragon voice recognition software and may contain some unintended transcription errors.

## 2022-08-26 NOTE — ED Notes (Signed)
Labeled specimen cup provided to pt for U/A collection per MD order. [employer] 

## 2022-08-26 NOTE — ED Notes (Signed)
Temp assessment 97.3 degrees. Nurse notified. [employer]

## 2022-08-26 NOTE — ED Notes (Signed)
Pt resting NAD noted, observed even RR and unlabored, side rails up x2 for safety, plan of care ongoing, pt expresses no needs or concerns at this time, call light within reach, no further concerns as of present.

## 2022-08-26 NOTE — ED Notes (Signed)
Pt given ice water to drink

## 2022-08-26 NOTE — ED Notes (Signed)
Pt ambulated to BR, approx 27 ft with this RN for assistance, pt c/o weakness, denied SOB, dizziness or CP. Pt was assisted back to treatment area via wheelchair. Warm blanket given, pt placed back on cardiac monitoring.

## 2022-08-26 NOTE — ED Provider Notes (Addendum)
[facility] DEPT Provider Note   CSN: [id number] Arrival date & time: 08/25/22  2348     History  Chief Complaint  Patient presents with   Abdominal Pain   Chest Pain    Samantha Rosales is a 41 y.o. female.  The history is provided by the patient.  Abdominal Pain Pain location:  Generalized Pain quality: cramping   Pain radiates to:  Does not radiate Pain severity:  Severe Onset quality:  Gradual Duration:  5 days Timing:  Constant Progression:  Unchanged Chronicity:  New Context: alcohol use   Relieved by:  Nothing Worsened by:  Nothing Associated symptoms: chest pain, diarrhea, nausea and vomiting   Associated symptoms: no fever   Risk factors: alcohol abuse   Chest Pain Associated symptoms: abdominal pain, nausea and vomiting   Associated symptoms: no fever and no palpitations   Patient with a history of alcohol abuse and alcoholic ketoacidosis who presents with 5 days of pain, nausea vomiting and diarrhea.  No fevers, or chills.  No urinary symptoms.  Has chest pain with each vomiting event.       Home Medications Prior to Admission medications   Medication Sig Start Date End Date Taking? Authorizing Provider  Acetaminophen (TYLENOL PO) Take 2 tablets by mouth every 6 (six) hours as needed (pain/headache).    [provider]  Adalimumab (HUMIRA) 40 MG/0.4ML PSKT Inject 40 mg into the skin every 14 (fourteen) days.    [provider]  BIOTIN PO Take 1 tablet by mouth daily.    [provider]  blood glucose meter kit and supplies KIT Dispense based on patient and insurance preference. Use up to four times daily as directed. 06/23/22   [name], MD  CALCIUM PO Take 1 tablet by mouth daily.    [provider]  Cholecalciferol (VITAMIN D3 PO) Take 1 tablet by mouth daily.    [provider]  cycloSPORINE (RESTASIS) 0.05 % ophthalmic emulsion Place 1 drop into both eyes 2 (two) times  daily as needed (watery eyes/irritation). 12/31/20   [provider]  escitalopram (LEXAPRO) 10 MG tablet Take 1 tablet (10 mg total) by mouth daily. 02/17/22   [name], MD  folic acid (FOLVITE) 1 MG tablet Take 1 mg by mouth daily.    [provider]  lipase/protease/amylase (CREON) 36000 UNITS CPEP capsule Take 2 capsules (72,000 Units total) by mouth 3 (three) times daily with meals. May also take 1 capsule (36,000 Units total) as needed (with snacks). 07/06/22   [name], MD  losartan (COZAAR) 50 MG tablet Take 1 tablet (50 mg total) by mouth daily. 06/24/22   [name], MD  MAGNESIUM PO Take 1 tablet by mouth daily.    [provider]  nebivolol (BYSTOLIC) 10 MG tablet Take 1 tablet (10 mg total) by mouth daily. 04/05/21   [name], MD  potassium chloride SA (KLOR-CON M) 20 MEQ tablet Take 2 tablets (40 mEq total) by mouth daily. Patient not taking: Reported on 06/21/2022 04/21/22 05/21/22  [name], MD  thiamine 100 MG tablet Take 100 mg by mouth 2 (two) times daily. 04/18/21   [provider]      Allergies    Penicillins    Review of Systems   Review of Systems  Constitutional:  Negative for fever.  HENT:  Negative for facial swelling.   Eyes:  Negative for redness.  Respiratory:  Negative for wheezing and stridor.  Cardiovascular:  Negative for palpitations and leg swelling.  Gastrointestinal:  Positive for abdominal pain, diarrhea, nausea and vomiting.  Neurological:  Negative for facial asymmetry.  All other systems reviewed and are negative.   Physical Exam Updated Vital Signs BP 113/89   Pulse (!) 113   Temp 98.5 F (36.9 C) (Rectal)   Resp (!) 21   SpO2 100%  Physical Exam Vitals and nursing note reviewed.  Constitutional:      General: She is not in acute distress.    Appearance: Normal appearance. She is well-developed.  HENT:     Head: Normocephalic and atraumatic.     Nose: Nose normal.   Eyes:     Extraocular Movements: Extraocular movements intact.     Pupils: Pupils are equal, round, and reactive to light.  Cardiovascular:     Rate and Rhythm: Normal rate and regular rhythm.     Pulses: Normal pulses.     Heart sounds: Normal heart sounds.  Pulmonary:     Effort: Pulmonary effort is normal. No respiratory distress.     Breath sounds: Normal breath sounds.  Abdominal:     General: Bowel sounds are normal. There is no distension.     Palpations: Abdomen is soft.     Tenderness: There is no abdominal tenderness. There is no guarding or rebound.  Genitourinary:    Vagina: No vaginal discharge.  Musculoskeletal:        General: Normal range of motion.     Cervical back: Neck supple.  Skin:    General: Skin is warm and dry.     Capillary Refill: Capillary refill takes less than 2 seconds.     Findings: No erythema or rash.  Neurological:     General: No focal deficit present.     Mental Status: She is alert and oriented to person, place, and time.     Deep Tendon Reflexes: Reflexes normal.  Psychiatric:        Mood and Affect: Mood normal.        Behavior: Behavior normal.     ED Results / Procedures / Treatments   Labs (all labs ordered are listed, but only abnormal results are displayed) Results for orders placed or performed during the hospital encounter of 08/25/22  Lipase, blood  Result Value Ref Range   Lipase 47 11 - 51 U/L  Comprehensive metabolic panel  Result Value Ref Range   Sodium 148 (H) 135 - 145 mmol/L   Potassium 3.2 (L) 3.5 - 5.1 mmol/L   Chloride 97 (L) 98 - 111 mmol/L   CO2 <7 (L) 22 - 32 mmol/L   Glucose, Bld 104 (H) 70 - 99 mg/dL   BUN 12 6 - 20 mg/dL   Creatinine, Ser 1.71 (H) 0.44 - 1.00 mg/dL   Calcium 9.1 8.9 - 10.3 mg/dL   Total Protein 8.9 (H) 6.5 - 8.1 g/dL   Albumin 4.2 3.5 - 5.0 g/dL   AST 163 (H) 15 - 41 U/L   ALT 45 (H) 0 - 44 U/L   Alkaline Phosphatase 167 (H) 38 - 126 U/L   Total Bilirubin 1.9 (H) 0.3 - 1.2 mg/dL    GFR, Estimated 38 (L) >60 mL/min   Anion gap UNABLE TO CALCULATE 5 - 15  CBC  Result Value Ref Range   WBC 8.7 4.0 - 10.5 K/uL   RBC 4.06 3.87 - 5.11 MIL/uL   Hemoglobin 13.6 12.0 - 15.0 g/dL   HCT 45.0 36.0 - 46.0 %  MCV 110.8 (H) 80.0 - 100.0 fL   MCH 33.5 26.0 - 34.0 pg   MCHC 30.2 30.0 - 36.0 g/dL   RDW 14.0 11.5 - 15.5 %   Platelets 96 (L) 150 - 400 K/uL   nRBC 0.2 0.0 - 0.2 %  I-Stat beta hCG blood, ED  Result Value Ref Range   I-stat hCG, quantitative <5.0 <5 mIU/mL   Comment 3          Troponin I (High Sensitivity)  Result Value Ref Range   Troponin I (High Sensitivity) 5 <18 ng/L   DG Chest Port 1 View  Result Date: 08/26/2022 CLINICAL DATA:  Central chest pain EXAM: PORTABLE CHEST 1 VIEW COMPARISON:  Radiographs 06/20/2022 FINDINGS: No focal consolidation, pleural effusion, or pneumothorax. Normal cardiomediastinal silhouette. No acute osseous abnormality. IMPRESSION: No active disease. Electronically Signed   By: Placido Sou M.D.   On: 08/26/2022 01:12      EKG EKG Interpretation  Date/Time:  Saturday August 26 2022 00:45:13 EDT Ventricular Rate:  113 PR Interval:  134 QRS Duration: 82 QT Interval:  387 QTC Calculation: 531 R Axis:   81 Text Interpretation: Sinus tachycardia Right atrial enlargement Consider left ventricular hypertrophy Confirmed by Dory Horn) on 08/26/2022 1:14:56 AM  Radiology DG Chest Port 1 View  Result Date: 08/26/2022 CLINICAL DATA:  Central chest pain EXAM: PORTABLE CHEST 1 VIEW COMPARISON:  Radiographs 06/20/2022 FINDINGS: No focal consolidation, pleural effusion, or pneumothorax. Normal cardiomediastinal silhouette. No acute osseous abnormality. IMPRESSION: No active disease. Electronically Signed   By: Placido Sou M.D.   On: 08/26/2022 01:12    Procedures Procedures    Medications Ordered in ED Medications  0.9 %  sodium chloride infusion ( Intravenous New Bag/Given 08/26/22 0225)  famotidine (PEPCID)  IVPB 20 mg premix (20 mg Intravenous New Bag/Given 08/26/22 0226)  sodium chloride 0.9 % bolus 1,000 mL (0 mLs Intravenous Stopped 08/26/22 0216)  ondansetron (ZOFRAN) injection 4 mg (4 mg Intravenous Given 08/26/22 0128)  thiamine (VITAMIN B1) injection 100 mg (100 mg Intravenous Given 08/26/22 0230)    ED Course/ Medical Decision Making/ A&P                           Medical Decision Making Patient with alcohol abuse who presents with nausea vomiting and diarrhea and pain.    Amount and/or Complexity of Data Reviewed Labs: ordered.    Details: All labs reviewed:  lipase is 47, elevated sodium 148,  low potassium 3.2,  CO2 < 7, elevated creatinine 1.71, elevated AST 163, elevated ALT 45 elevated bilirubin 1.9.  > 25 anion gap.  Normal white count 8.7 and normal hemoglobin and low platelet count 96. Troponin negative 5 Radiology: ordered and independent interpretation performed.    Details: Negative chest Xray by me  ECG/medicine tests: ordered and independent interpretation performed. Decision-making details documented in ED Course. Discussion of management or test interpretation with external provider(s): Case d/w Dr. Kathrine Cords   Risk Prescription drug management. Decision regarding hospitalization.    Final Clinical Impression(s) / ED Diagnoses Final diagnoses:  AKI (acute kidney injury) ([place])  Alcoholic ketoacidosis   The patient appears reasonably stabilized for admission considering the current resources, flow, and capabilities available in the ED at this time, and I doubt any other [facility] requiring further screening and/or treatment in the ED prior to admission.  Rx / DC Orders ED Discharge Orders     None  Tyannah Sane, MD 08/26/22 [phone]

## 2022-08-27 ENCOUNTER — Other Ambulatory Visit: Payer: Self-pay

## 2022-08-27 DIAGNOSIS — K8681 Exocrine pancreatic insufficiency: Secondary | ICD-10-CM | POA: Diagnosis present

## 2022-08-27 DIAGNOSIS — N179 Acute kidney failure, unspecified: Secondary | ICD-10-CM | POA: Diagnosis present

## 2022-08-27 DIAGNOSIS — K759 Inflammatory liver disease, unspecified: Secondary | ICD-10-CM | POA: Diagnosis present

## 2022-08-27 DIAGNOSIS — E43 Unspecified severe protein-calorie malnutrition: Secondary | ICD-10-CM | POA: Diagnosis present

## 2022-08-27 DIAGNOSIS — D638 Anemia in other chronic diseases classified elsewhere: Secondary | ICD-10-CM | POA: Diagnosis present

## 2022-08-27 DIAGNOSIS — R823 Hemoglobinuria: Secondary | ICD-10-CM | POA: Diagnosis present

## 2022-08-27 DIAGNOSIS — I10 Essential (primary) hypertension: Secondary | ICD-10-CM | POA: Diagnosis present

## 2022-08-27 DIAGNOSIS — R809 Proteinuria, unspecified: Secondary | ICD-10-CM | POA: Diagnosis present

## 2022-08-27 DIAGNOSIS — F1010 Alcohol abuse, uncomplicated: Secondary | ICD-10-CM | POA: Diagnosis present

## 2022-08-27 DIAGNOSIS — K219 Gastro-esophageal reflux disease without esophagitis: Secondary | ICD-10-CM | POA: Diagnosis present

## 2022-08-27 DIAGNOSIS — E876 Hypokalemia: Secondary | ICD-10-CM | POA: Diagnosis present

## 2022-08-27 DIAGNOSIS — R739 Hyperglycemia, unspecified: Secondary | ICD-10-CM | POA: Diagnosis present

## 2022-08-27 DIAGNOSIS — D696 Thrombocytopenia, unspecified: Secondary | ICD-10-CM | POA: Diagnosis present

## 2022-08-27 DIAGNOSIS — M069 Rheumatoid arthritis, unspecified: Secondary | ICD-10-CM | POA: Diagnosis present

## 2022-08-27 DIAGNOSIS — K861 Other chronic pancreatitis: Secondary | ICD-10-CM | POA: Diagnosis present

## 2022-08-27 DIAGNOSIS — R824 Acetonuria: Secondary | ICD-10-CM | POA: Diagnosis present

## 2022-08-27 DIAGNOSIS — J309 Allergic rhinitis, unspecified: Secondary | ICD-10-CM | POA: Diagnosis present

## 2022-08-27 DIAGNOSIS — E870 Hyperosmolality and hypernatremia: Secondary | ICD-10-CM | POA: Diagnosis present

## 2022-08-27 DIAGNOSIS — Z681 Body mass index (BMI) 19 or less, adult: Secondary | ICD-10-CM | POA: Diagnosis not present

## 2022-08-27 DIAGNOSIS — R569 Unspecified convulsions: Secondary | ICD-10-CM | POA: Diagnosis present

## 2022-08-27 DIAGNOSIS — A6009 Herpesviral infection of other urogenital tract: Secondary | ICD-10-CM | POA: Diagnosis present

## 2022-08-27 DIAGNOSIS — E039 Hypothyroidism, unspecified: Secondary | ICD-10-CM | POA: Diagnosis present

## 2022-08-27 DIAGNOSIS — E8729 Other acidosis: Secondary | ICD-10-CM | POA: Diagnosis present

## 2022-08-27 LAB — COMPREHENSIVE METABOLIC PANEL
ALT: 30 U/L (ref 0–44)
AST: 74 U/L — ABNORMAL HIGH (ref 15–41)
Albumin: 3.2 g/dL — ABNORMAL LOW (ref 3.5–5.0)
Alkaline Phosphatase: 115 U/L (ref 38–126)
Anion gap: 9 (ref 5–15)
BUN: 6 mg/dL (ref 6–20)
CO2: 23 mmol/L (ref 22–32)
Calcium: 9 mg/dL (ref 8.9–10.3)
Chloride: 107 mmol/L (ref 98–111)
Creatinine, Ser: 0.63 mg/dL (ref 0.44–1.00)
GFR, Estimated: >60 mL/min (ref >60)
Glucose, Bld: 152 mg/dL — ABNORMAL HIGH (ref 70–99)
Potassium: 2.6 mmol/L — CL (ref 3.5–5.1)
Sodium: 139 mmol/L (ref 135–145)
Total Bilirubin: 1.2 mg/dL (ref 0.3–1.2)
Total Protein: 6.7 g/dL (ref 6.5–8.1)

## 2022-08-27 LAB — GLUCOSE, CAPILLARY
Glucose-Capillary: 134 mg/dL — ABNORMAL HIGH (ref 70–99)
Glucose-Capillary: 138 mg/dL — ABNORMAL HIGH (ref 70–99)
Glucose-Capillary: 148 mg/dL — ABNORMAL HIGH (ref 70–99)
Glucose-Capillary: 159 mg/dL — ABNORMAL HIGH (ref 70–99)
Glucose-Capillary: 201 mg/dL — ABNORMAL HIGH (ref 70–99)
Glucose-Capillary: 252 mg/dL — ABNORMAL HIGH (ref 70–99)
Glucose-Capillary: 256 mg/dL — ABNORMAL HIGH (ref 70–99)
Glucose-Capillary: 80 mg/dL (ref 70–99)

## 2022-08-27 LAB — MAGNESIUM: Magnesium: 1.7 mg/dL (ref 1.7–2.4)

## 2022-08-27 LAB — CBC
HCT: 28.8 % — ABNORMAL LOW (ref 36.0–46.0)
Hemoglobin: 10 g/dL — ABNORMAL LOW (ref 12.0–15.0)
MCH: 32.9 pg (ref 26.0–34.0)
MCHC: 34.7 g/dL (ref 30.0–36.0)
MCV: 94.7 fL (ref 80.0–100.0)
Platelets: 47 10*3/uL — ABNORMAL LOW (ref 150–400)
RBC: 3.04 MIL/uL — ABNORMAL LOW (ref 3.87–5.11)
RDW: 13.2 % (ref 11.5–15.5)
WBC: 6 10*3/uL (ref 4.0–10.5)
nRBC: 0 % (ref 0.0–0.2)

## 2022-08-27 LAB — POTASSIUM
Potassium: 3.4 mmol/L — ABNORMAL LOW (ref 3.5–5.1)
Potassium: 3.6 mmol/L (ref 3.5–5.1)

## 2022-08-27 LAB — PHOSPHORUS
Phosphorus: <1 mg/dL — CL (ref 2.5–4.6)
Phosphorus: 2.7 mg/dL (ref 2.5–4.6)

## 2022-08-27 LAB — BETA-HYDROXYBUTYRIC ACID: Beta-Hydroxybutyric Acid: 0.72 mmol/L — ABNORMAL HIGH (ref 0.05–0.27)

## 2022-08-27 MED ORDER — LACTATED RINGERS IV SOLN: INTRAVENOUS | Status: DC

## 2022-08-27 MED ORDER — CHLORHEXIDINE GLUCONATE CLOTH 2 % EX PADS: 6.0000 | MEDICATED_PAD | Freq: Every day | CUTANEOUS | Status: DC

## 2022-08-27 MED ORDER — INSULIN ASPART 100 UNIT/ML IJ SOLN
0.0000 [IU] | INTRAMUSCULAR | Status: DC
  Administered 2022-08-27: 2 [IU] via SUBCUTANEOUS

## 2022-08-27 MED ORDER — MAGNESIUM SULFATE 2 GM/50ML IV SOLN
2.0000 g | Freq: Once | INTRAVENOUS | Status: AC
  Administered 2022-08-27: 2 g via INTRAVENOUS
  Filled 2022-08-27: qty 50

## 2022-08-27 MED ORDER — POTASSIUM CHLORIDE 10 MEQ/100ML IV SOLN
10.0000 meq | INTRAVENOUS | Status: AC
  Administered 2022-08-27 (×4): 10 meq via INTRAVENOUS
  Filled 2022-08-27 (×4): qty 100

## 2022-08-27 MED ORDER — PANTOPRAZOLE SODIUM 40 MG IV SOLR
40.0000 mg | Freq: Every day | INTRAVENOUS | Status: DC
  Administered 2022-08-27 – 2022-08-29 (×3): 40 mg via INTRAVENOUS
  Filled 2022-08-27 (×3): qty 10

## 2022-08-27 MED ORDER — INSULIN ASPART 100 UNIT/ML IJ SOLN
0.0000 [IU] | Freq: Three times a day (TID) | INTRAMUSCULAR | Status: DC
  Administered 2022-08-27: 1 [IU] via SUBCUTANEOUS
  Administered 2022-08-27: 5 [IU] via SUBCUTANEOUS
  Administered 2022-08-28 (×2): 2 [IU] via SUBCUTANEOUS
  Administered 2022-08-28: 1 [IU] via SUBCUTANEOUS
  Administered 2022-08-29: 2 [IU] via SUBCUTANEOUS

## 2022-08-27 MED ORDER — POTASSIUM PHOSPHATES 15 MMOLE/5ML IV SOLN
30.0000 mmol | Freq: Once | INTRAVENOUS | Status: AC
  Administered 2022-08-27: 30 mmol via INTRAVENOUS
  Filled 2022-08-27: qty 10

## 2022-08-27 NOTE — Plan of Care (Signed)
  Problem: Education: Goal: Knowledge of General Education information will improve Description Including pain rating scale, medication(s)/side effects and non-pharmacologic comfort measures Outcome: Progressing   

## 2022-08-27 NOTE — Progress Notes (Signed)
       CROSS COVER NOTE  NAME: Samantha Rosales MRN: [id number] DOB : Dec 22, 1980  Notified by nursing they received ENDOTOOL alert that patient is to be transitioned off of insulin infusion.  Chart reviewed.  Gap is closed and beta-hydroxybutyrate level normalizing.    Patient not on scheduled basal insulin at home and A1C was 4.3 yesterday.  Will place on q4H SSI and accuchecks at this time and no basal insulin.  Clear liquid diet previously ordered has been resumed.  This document was prepared using Dragon voice recognition software and may include unintentional dictation errors.  Neomia Glass DNP, MHA, FNP-BC Nurse Practitioner [facility] Pager [phone]

## 2022-08-27 NOTE — Progress Notes (Signed)
PROGRESS NOTE Samantha Rosales  [id number] DOB: [birth year] DOA: 08/25/2022 PCP: [name], MD   Brief Narrative/Hospital Course: 41 y.o.f  w/ history significant of abnormal Pap smear,GERD, history of hepatitis due to methotrexate or alcohol use, hyperlipidemia, HSV-2 infection, hypertension, hypothyroidism, rheumatoid arthritis, seizures as a child but no episodes since 89, tobacco use, multiple episodes of alcoholic pancreatitis who came into the ED w/ epigastric abdominal pain radiated to her back associated with central chest pain due to multiple episodes of nausea with emesis and also she had multiple episodes of diarrhea. She has not eaten much in the past 3 days PTA,has been drinking multiple types of alcoholic beverages.  ED course: Initial vital signs were temperature 98.5 F, pulse 86, respiration 17, BP 85/57 mmHg with an O2 sat of 99% on room air.  The patient received famotidine 20 mg IVP, 2000 mL of normal saline bolus, thiamine 100 mg IVP and 4 mg of ondansetron IVP. Labs: Urinalysis was hazy, small hemoglobinuria, ketonuria of 80 and proteinuria 100 mg/dL.  CBC showed her white at count 8.7, hemoglobin 13.6 g/dL platelets 96.  Troponin x2 normal.  Lipase was 47.  Sodium 148, potassium 3.2, chloride 97 and CO2 less than 7 mmol/L with an anion gap of 38.  Glucose 104, BUN 12 and creatinine 1.71 mg deciliter. AST 163, ALT 45 and alkaline phosphatase 167 units/L.  Total bilirubin was 1.9 mg/dL.  Magnesium was 1.5 and phosphorus 7.3 Cxr-no active disease.  CT renal stone possible terminal ileitis though this is incompletely evaluated with IV contrast.  There is marked hepatic asteatosis.  Diffuse pancreatic parenchymal calcifications compatible with sequela of chronic pancreatitis.  There was marked distention of the stomach.  Bilateral femoral head avascular necrosis.  Patient was admitted to stepdown for nausea vomiting abdominal pain alcoholic ketoacidosis AKI and significant  electrolyte imbalance  thrombocytopenia. She also had uncontrolled hyperglycemia placed on insulin drip> and overnight after gap was closed and BHOB improved she was placed on diet and placed in transitioned off insulin drip     Subjective: Seen and examined this morning. Alert awake oriented no tremor denies nausea vomiting chest pain or abdominal pain.  No diarrhea.   Assessment and Plan: Principal Problem:   Alcoholic ketoacidosis Active Problems:   Hypomagnesemia   Hypophosphatemia   Alcohol abuse   Essential hypertension   Abnormal LFTs   Tobacco abuse   Anemia, chronic disease   Thrombocytopenia (HCC)   AKI (acute kidney injury) (HCC)   Abdominal pain   Nausea and vomiting   Abdomen pain w/ nausea vomiting diarrhea Possible terminal ileitis Chronic pancreatitis Alcoholic ketoacidosis possible startvation ketoacidosis: This morning denies any nausea vomiting abdominal pain, clinically improved, CT stone studies- as above:possible terminal ileitis,marked hepatic asteatosis diffuse pancreatic parenchymal calcifications. Ketoacidosis resolved.  Starting full liquid diet and advance as tolerated, continue pain control, IV fluids, PPI, antiemetics.  Alcohol abuse Hepatic steatosis Transaminitis:: At risk of withdrawal, continue Ativan ciwa scale, folate, thiamine.  AST ALT 74/30 with normal daily.  Discussed alcohol cessation.  Hyperglycemia: No known diabetes history, blood sugar stable A1c normal.  On SSI Recent Labs  Lab 08/26/22 1712 08/26/22 1733 08/26/22 2308 08/27/22 0022 08/27/22 0225 08/27/22 0328 08/27/22 0740  GLUCAP  --    < > 169* 159* 138* 148* 80  HGBA1C 4.3*  --   --   --   --   --   --    < > = values in this interval not displayed.  Acute kidney injury: In the setting of decreased oral intake/dehydration, AKI resolved with IV fluids, slowly wean down fluids increase intake PO Recent Labs  Lab 08/26/22 0016 08/26/22 0447 08/26/22 1543  08/26/22 1712 08/27/22 0023  BUN 12 12 8 8 6   CREATININE 1.71* 1.59* 1.04* 0.93 0.63    Hypokalemia Hypomagnesemia Hyper and hypophosphatemia: Repeat electrolytes this morning we will replete with K-Phos, mag IV repeat labs at 4 PM discussed with pharmacy. Recent Labs  Lab 08/26/22 0016 08/26/22 0447 08/26/22 1543 08/26/22 1712 08/27/22 0023 08/27/22 0827  K 3.2* 4.4 3.4* 2.9* 2.6* 3.4*  CALCIUM 9.1 8.2* 7.8* 7.9* 9.0  --   MG  --  1.5*  --   --   --  1.7  PHOS  --  7.3* 1.3*  --   --  <1.0*    Thrombocytopenia: In the setting of alcohol abuse chronically low, monitor Recent Labs  Lab 08/26/22 0016 08/26/22 0447 08/27/22 0023  PLT 96* 77* 47*    Essential hypertension: BP stable not on meds  Anemia of chronic disease: Hemoglobin is stable 10 g.her baseline is 9 g initially high value from hemoconcentration  Low BMI at 16, consulted dietitian   DVT prophylaxis: SCDs Start: 08/26/22 0351 Code Status:   Code Status: Full Code Family Communication: plan of care discussed with patient at bedside. Patient status is: Observation but remains hospitalized needing at least 2 midnight because of severe electrolyte imbalance needing IV replacement  Level of care: Stepdown  Dispo: The patient is from: home            Anticipated disposition: home  Mobility Assessment (last 72 hours)     Mobility Assessment   No documentation.            Objective: Vitals last 24 hrs: Vitals:   08/27/22 0400 08/27/22 0600 08/27/22 0800 08/27/22 0806  BP: (!) 140/104 107/72 128/79   Pulse: 90 91 81   Resp: 17 16 19    Temp:    98.2 F (36.8 C)  TempSrc:    Oral  SpO2: 97% 95% 99%   Weight:      Height:       Weight change:   Physical Examination: General exam: alert awake, thin frail older than stated age, weak appearing. HEENT:Oral mucosa moist, Ear/Nose WNL grossly, dentition normal. Respiratory system: bilaterally clear BS, no use of accessory muscle Cardiovascular  system: S1 & S2 +, No JVD. Gastrointestinal system: Abdomen soft,NT,ND, BS+ Nervous System:Alert, awake, moving extremities and grossly nonfocal Extremities: LE edema neg,distal peripheral pulses palpable.  Skin: No rashes,no icterus. MSK: Normal muscle bulk,tone, power  Medications reviewed:  Scheduled Meds:  Chlorhexidine Gluconate Cloth  6 each Topical 99991111   folic acid  1 mg Oral Daily   insulin aspart  0-15 Units Subcutaneous Q4H   LORazepam  0-4 mg Oral Q4H   Followed by   Derrill Memo ON 08/28/2022] LORazepam  0-4 mg Oral Q8H   multivitamin with minerals  1 tablet Oral Daily   nicotine  14 mg Transdermal Daily   thiamine  100 mg Oral Daily   Or   thiamine  100 mg Intravenous Daily   Continuous Infusions:  lactated ringers 125 mL/hr at 08/27/22 0700   magnesium sulfate bolus IVPB     potassium PHOSPHATE IVPB (in mmol)        Diet Order             Diet full liquid Room service appropriate? Yes; Fluid consistency:  Thin  Diet effective now                  Unresulted Labs (From admission, onward)     Start     Ordered   08/27/22 1700  Potassium  Once,   R       Question:  Specimen collection method  Answer:  Lab=Lab collect   08/27/22 1020   08/27/22 1700  Phosphorus  Once-Timed,   TIMED       Question:  Specimen collection method  Answer:  Lab=Lab collect   08/27/22 1020   08/27/22 0500  Comprehensive metabolic panel  Daily at 5am,   R      08/26/22 1102   08/27/22 0500  CBC  Daily at 5am,   R      08/26/22 1102          Data Reviewed: I have personally reviewed following labs and imaging studies CBC: Recent Labs  Lab 08/26/22 0016 08/26/22 0447 08/27/22 0023  WBC 8.7 8.5 6.0  NEUTROABS  --  7.1  --   HGB 13.6 13.2 10.0*  HCT 45.0 42.1 28.8*  MCV 110.8* 104.5* 94.7  PLT 96* 77* 47*   Basic Metabolic Panel: Recent Labs  Lab 08/26/22 0016 08/26/22 0447 08/26/22 1543 08/26/22 1712 08/27/22 0023 08/27/22 0827  NA 148* 139 134* 133* 139  --    K 3.2* 4.4 3.4* 2.9* 2.6* 3.4*  CL 97* 94* 104 103 107  --   CO2 <7* 7* 14* 16* 23  --   GLUCOSE 104* 146* 434* 392* 152*  --   BUN 12 12 8 8 6   --   CREATININE 1.71* 1.59* 1.04* 0.93 0.63  --   CALCIUM 9.1 8.2* 7.8* 7.9* 9.0  --   MG  --  1.5*  --   --   --  1.7  PHOS  --  7.3* 1.3*  --   --  <1.0*   GFR: Estimated Creatinine Clearance: 70.6 mL/min (by C-G formula based on SCr of 0.63 mg/dL). Liver Function Tests: Recent Labs  Lab 08/26/22 0016 08/26/22 0447 08/27/22 0023  AST 163* 136* 74*  ALT 45* 40 30  ALKPHOS 167* 153* 115  BILITOT 1.9* 3.4* 1.2  PROT 8.9* 8.3* 6.7  ALBUMIN 4.2 4.0 3.2*   Recent Labs  Lab 08/26/22 0016  LIPASE 47   No results for input(s): "AMMONIA" in the last 168 hours. Coagulation Profile: No results for input(s): "INR", "PROTIME" in the last 168 hours. BNP (last 3 results) No results for input(s): "PROBNP" in the last 8760 hours. HbA1C: Recent Labs    08/26/22 1712  HGBA1C 4.3*   CBG: Recent Labs  Lab 08/26/22 2308 08/27/22 0022 08/27/22 0225 08/27/22 0328 08/27/22 0740  GLUCAP 169* 159* 138* 148* 80   Lipid Profile: No results for input(s): "CHOL", "HDL", "LDLCALC", "TRIG", "CHOLHDL", "LDLDIRECT" in the last 72 hours. Thyroid Function Tests: No results for input(s): "TSH", "T4TOTAL", "FREET4", "T3FREE", "THYROIDAB" in the last 72 hours. Sepsis Labs: Recent Labs  Lab 08/26/22 1110  LATICACIDVEN 2.0*    Recent Results (from the past 240 hour(s))  MRSA Next Gen by PCR, Nasal     Status: None   Collection Time: 08/26/22  1:54 PM   Specimen: Nasal Mucosa; Nasal Swab  Result Value Ref Range Status   MRSA by PCR Next Gen NOT DETECTED NOT DETECTED Final    Comment: (NOTE) The GeneXpert MRSA Assay (FDA approved for NASAL specimens only), is  one component of a comprehensive MRSA colonization surveillance program. It is not intended to diagnose MRSA infection nor to guide or monitor treatment for MRSA infections. Test  performance is not FDA approved in patients less than 17 years old. Performed at [facility], [place] [address], [place], [place] [address]     Antimicrobials: Anti-infectives (From admission, onward)    None      Culture/Microbiology    Component Value Date/Time   SDES  06/20/2022 0931    BLOOD RIGHT ANTECUBITAL Performed at [facility], [place] [address], [place], [place] [address]    SPECREQUEST  06/20/2022 0931    BOTTLES DRAWN AEROBIC AND ANAEROBIC Blood Culture results may not be optimal due to an inadequate volume of blood received in culture bottles Performed at [facility], [address] [place], [place] [address]    CULT  06/20/2022 0931    NO GROWTH 5 DAYS Performed at [facility], [place] [address] [place], [place] [address]    REPTSTATUS 06/25/2022 FINAL 06/20/2022 [id number]   Other culture-see note  Radiology Studies: CT Renal Stone Study  Result Date: 08/26/2022 CLINICAL DATA:  Flank pain; kidney stones suspected EXAM: CT ABDOMEN AND PELVIS WITHOUT CONTRAST TECHNIQUE: Multidetector CT imaging of the abdomen and pelvis was performed following the standard protocol without IV contrast. RADIATION DOSE REDUCTION: This exam was performed according to the departmental dose-optimization program which includes automated exposure control, adjustment of the mA and/or kV according to patient size and/or use of iterative reconstruction technique. COMPARISON:  CT abdomen and pelvis 05/28/2020 FINDINGS: Lower chest: No acute abnormality. Hepatobiliary: Marked hepatic steatosis. No focal liver lesion. No evidence of cholecystitis. Pancreas: Diffuse pancreatic parenchymal calcifications compatible with sequela of chronic pancreatitis. Spleen: Normal in size without focal abnormality. Adrenals/Urinary Tract: Unremarkable adrenal glands. Normal noncontrast appearance of the kidneys. No hydronephrosis or urinary calculi. Unremarkable  bladder. Stomach/Bowel: Normal caliber large and small bowel. Normal appendix. Distended stomach. Wall thickening about the terminal ileum. No significant adjacent inflammatory stranding. Vascular/Lymphatic: Mild aortic atherosclerosis. No lymphadenopathy. Reproductive: Unremarkable. Other: No free intraperitoneal air or fluid. Musculoskeletal: Serpiginous sclerosis in both femoral heads compatible with avascular necrosis. No subchondral collapse. Mild sclerosis both SI joints. No acute osseous abnormality. IMPRESSION: 1. Possible terminal ileitis though this is incompletely evaluated without IV contrast. 2. Marked hepatic steatosis. 3. Diffuse pancreatic parenchymal calcifications compatible with sequela of chronic pancreatitis. 4. Marked distention of the stomach. 5. Bilateral femoral head avascular necrosis. Electronically Signed   By: Placido Sou M.D.   On: 08/26/2022 04:05   DG Chest Port 1 View  Result Date: 08/26/2022 CLINICAL DATA:  Central chest pain EXAM: PORTABLE CHEST 1 VIEW COMPARISON:  Radiographs 06/20/2022 FINDINGS: No focal consolidation, pleural effusion, or pneumothorax. Normal cardiomediastinal silhouette. No acute osseous abnormality. IMPRESSION: No active disease. Electronically Signed   By: Placido Sou M.D.   On: 08/26/2022 01:12     LOS: 0 days   Antonieta Pert, MD [facility]  08/27/2022, 10:23 AM

## 2022-08-27 NOTE — Inpatient Diabetes Management (Signed)
Inpatient Diabetes Program Recommendations  AACE/ADA: New Consensus Statement on Inpatient Glycemic Control (2015)  Target Ranges:  Prepandial:   less than 140 mg/dL      Peak postprandial:   less than 180 mg/dL (1-2 hours)      Critically ill patients:  140 - 180 mg/dL   Lab Results  Component Value Date   GLUCAP 80 08/27/2022   HGBA1C 4.3 (L) 08/26/2022    Review of Glycemic Control  Latest Reference Range & Units 08/26/22 17:12 08/27/22 00:23  Beta-Hydroxybutyric Acid 0.05 - 0.27 mmol/L 7.60 (H) 0.72 (H)  (H): Data is abnormally high  Latest Reference Range & Units 08/27/22 03:28 08/27/22 07:40  Glucose-Capillary 70 - 99 mg/dL 148 (H) Novolog 2 units 80  (H): Data is abnormally high  Diabetes history:  None Alcoholic ketoacidosis Chronic pancreatitis  Current orders for Inpatient glycemic control:  Transitioned from IV insulin to Novolog 0-15 units Q4H  Referral received for DM management evaluation.  Might consider Novolog 0-9 units TID and HS.    Will continue to follow while inpatient.  Thank you, Reche Dixon, MSN, [place] [facility] Coordinator Inpatient Diabetes Program [phone] (team pager from 8a-5p)

## 2022-08-27 NOTE — Hospital Course (Addendum)
41 y.o.f  w/ history significant of abnormal Pap smear,GERD, history of hepatitis due to methotrexate or alcohol use, hyperlipidemia, HSV-2 infection, hypertension, hypothyroidism, rheumatoid arthritis, seizures as a child but no episodes since 89, tobacco use, multiple episodes of alcoholic pancreatitis who came into the ED w/ epigastric abdominal pain radiated to her back associated with central chest pain due to multiple episodes of nausea with emesis and also she had multiple episodes of diarrhea. She has not eaten much in the past 3 days PTA,has been drinking multiple types of alcoholic beverages.  ED course: Initial vital signs were temperature 98.5 F, pulse 86, respiration 17, BP 85/57 mmHg with an O2 sat of 99% on room air.  The patient received famotidine 20 mg IVP, 2000 mL of normal saline bolus, thiamine 100 mg IVP and 4 mg of ondansetron IVP. Labs: Urinalysis was hazy, small hemoglobinuria, ketonuria of 80 and proteinuria 100 mg/dL.  CBC showed her white at count 8.7, hemoglobin 13.6 g/dL platelets 96.  Troponin x2 normal.  Lipase was 47.  Sodium 148, potassium 3.2, chloride 97 and CO2 less than 7 mmol/L with an anion gap of 38.  Glucose 104, BUN 12 and creatinine 1.71 mg deciliter. AST 163, ALT 45 and alkaline phosphatase 167 units/L.  Total bilirubin was 1.9 mg/dL.  Magnesium was 1.5 and phosphorus 7.3 Cxr-no active disease.  CT renal stone possible terminal ileitis though this is incompletely evaluated with IV contrast.  There is marked hepatic asteatosis.  Diffuse pancreatic parenchymal calcifications compatible with sequela of chronic pancreatitis.  There was marked distention of the stomach.  Bilateral femoral head avascular necrosis. Patient was admitted to stepdown for nausea vomiting abdominal pain alcoholic ketoacidosis AKI and significant electrolyte imbalance  thrombocytopenia. She also had uncontrolled hyperglycemia placed on insulin drip> and overnight after gap was closed and BHOB  improved she was placed on diet and placed in transitioned off insulin drip. Patient to follow-up with PCP regarding hyperglycemia with normal A1c. Or your lites at this time has stabilized and improved potassium 3.6 mag 1.6 phosphorus normalized.  She is supposed to be on daily potassium supplement I have reminded her that encouraged to take potassium rich food.  She feels well has been ambulating using a shower .  Platelet counts are trending up slowly and will need to be followed up by her primary care doctor with CBC check in a week

## 2022-08-28 DIAGNOSIS — E8729 Other acidosis: Secondary | ICD-10-CM | POA: Diagnosis not present

## 2022-08-28 LAB — CBC
HCT: 27.8 % — ABNORMAL LOW (ref 36.0–46.0)
Hemoglobin: 9.5 g/dL — ABNORMAL LOW (ref 12.0–15.0)
MCH: 32.6 pg (ref 26.0–34.0)
MCHC: 34.2 g/dL (ref 30.0–36.0)
MCV: 95.5 fL (ref 80.0–100.0)
Platelets: 35 10*3/uL — ABNORMAL LOW (ref 150–400)
RBC: 2.91 MIL/uL — ABNORMAL LOW (ref 3.87–5.11)
RDW: 13.6 % (ref 11.5–15.5)
WBC: 3.6 10*3/uL — ABNORMAL LOW (ref 4.0–10.5)
nRBC: 0 % (ref 0.0–0.2)

## 2022-08-28 LAB — COMPREHENSIVE METABOLIC PANEL
ALT: 30 U/L (ref 0–44)
AST: 77 U/L — ABNORMAL HIGH (ref 15–41)
Albumin: 3.2 g/dL — ABNORMAL LOW (ref 3.5–5.0)
Alkaline Phosphatase: 110 U/L (ref 38–126)
Anion gap: 10 (ref 5–15)
BUN: <5 mg/dL — ABNORMAL LOW (ref 6–20)
CO2: 24 mmol/L (ref 22–32)
Calcium: 9.3 mg/dL (ref 8.9–10.3)
Chloride: 103 mmol/L (ref 98–111)
Creatinine, Ser: 0.57 mg/dL (ref 0.44–1.00)
GFR, Estimated: >60 mL/min (ref >60)
Glucose, Bld: 190 mg/dL — ABNORMAL HIGH (ref 70–99)
Potassium: 2.4 mmol/L — CL (ref 3.5–5.1)
Sodium: 137 mmol/L (ref 135–145)
Total Bilirubin: 0.8 mg/dL (ref 0.3–1.2)
Total Protein: 6.8 g/dL (ref 6.5–8.1)

## 2022-08-28 LAB — GLUCOSE, CAPILLARY
Glucose-Capillary: 133 mg/dL — ABNORMAL HIGH (ref 70–99)
Glucose-Capillary: 177 mg/dL — ABNORMAL HIGH (ref 70–99)
Glucose-Capillary: 173 mg/dL — ABNORMAL HIGH (ref 70–99)
Glucose-Capillary: 153 mg/dL — ABNORMAL HIGH (ref 70–99)

## 2022-08-28 LAB — PHOSPHORUS: Phosphorus: 1.4 mg/dL — ABNORMAL LOW (ref 2.5–4.6)

## 2022-08-28 LAB — MAGNESIUM: Magnesium: 2 mg/dL (ref 1.7–2.4)

## 2022-08-28 MED ORDER — ENSURE ENLIVE PO LIQD
237.0000 mL | Freq: Two times a day (BID) | ORAL | Status: DC
  Administered 2022-08-29 (×2): 237 mL via ORAL

## 2022-08-28 MED ORDER — POTASSIUM PHOSPHATES 15 MMOLE/5ML IV SOLN
30.0000 mmol | Freq: Once | INTRAVENOUS | Status: AC
  Administered 2022-08-28: 30 mmol via INTRAVENOUS
  Filled 2022-08-28: qty 10

## 2022-08-28 MED ORDER — POTASSIUM CHLORIDE CRYS ER 20 MEQ PO TBCR
40.0000 meq | EXTENDED_RELEASE_TABLET | ORAL | Status: AC
  Administered 2022-08-28 (×2): 40 meq via ORAL
  Filled 2022-08-28 (×2): qty 2

## 2022-08-28 MED ORDER — POTASSIUM CHLORIDE 10 MEQ/100ML IV SOLN: 10.0000 meq | INTRAVENOUS | Status: DC

## 2022-08-28 NOTE — TOC Initial Note (Signed)
Transition of Care [facility]) - Initial/Assessment Note    Patient Details  Name: Samantha Rosales MRN: [id number] Date of Birth: 1980/12/26  [facility] of [facility]) CM/SW Contact:    Vassie Moselle, LCSW Phone Number: 08/28/2022, 10:16 AM  Clinical Narrative:                 TOC consulted for SA resources/education. Per chart review pt drinks 28 standard drinks per week (beer/liquor). CSW met with pt who shares that she has been going through a tough time. She states she has been depressed, is going to be going through divorce soon, and is 8 months past due on her 48. She reports she is connected with a program that is helping her with her finances and her mortgage. She states that she has substance use resources at home but, has not connected with any agency/program as of yet. CSW encouraged pt to establish services for counseling for substance use and mental health concerns. CSW discussed importance of having healthy support system in place especially when going through divorce. Pt was agreeable to this and reports she will follow up at discharge with resources. Additional resources have been added to pt's AVS to be given at discharge. CSW encouraged pt to reach out if she has additional needs, questions, or concerns.   Expected Discharge Plan: Home/Self Care Barriers to Discharge: No Barriers Identified   Patient Goals and CMS Choice Patient states their goals for this hospitalization and ongoing recovery are:: To return home   Choice offered to / list presented to : Patient  Expected Discharge Plan and Services Expected Discharge Plan: Home/Self Care In-house Referral: NA Discharge Planning Services: CM Consult Post Acute Care Choice: NA Living arrangements for the past 2 months: Single Family Home                 DME Arranged: N/A DME Agency: NA                  Prior Living Arrangements/Services Living arrangements for the past 2 months: Single Family Home Lives  with:: Self Patient language and need for interpreter reviewed:: Yes Do you feel safe going back to the place where you live?: Yes      Need for Family Participation in Patient Care: No (Comment) Care giver support system in place?: No (comment)   Criminal Activity/Legal Involvement Pertinent to Current Situation/Hospitalization: No - Comment as needed  Activities of Daily Living Home Assistive Devices/Equipment: Blood pressure cuff ADL Screening (condition at time of admission) Patient's cognitive ability adequate to safely complete daily activities?: Yes Is the patient deaf or have difficulty hearing?: No Does the patient have difficulty seeing, even when wearing glasses/contacts?: No Does the patient have difficulty concentrating, remembering, or making decisions?: No Patient able to express need for assistance with ADLs?: Yes Does the patient have difficulty dressing or bathing?: No Independently performs ADLs?: Yes (appropriate for developmental age) Does the patient have difficulty walking or climbing stairs?: No Weakness of Legs: None Weakness of Arms/Hands: None  Permission Sought/Granted   Permission granted to share information with : No              Emotional Assessment Appearance:: Appears older than stated age Attitude/Demeanor/Rapport: Engaged, Gracious Affect (typically observed): Accepting Orientation: : Oriented to Self, Oriented to Place, Oriented to  Time, Oriented to Situation Alcohol / Substance Use: Alcohol Use Psych Involvement: No (comment)  Admission diagnosis:  Alcoholic ketoacidosis [Q75.91] Thrombocytopenia (HCC) [D69.6] AKI (acute kidney  injury) ([place]) [N17.9] Nausea vomiting and diarrhea [R11.2, R19.7] Patient Active Problem List   Diagnosis Date Noted   Hyperphosphatemia 08/26/2022   Thrombocytopenia ([place]) 08/26/2022   AKI (acute kidney injury) ([place]) 08/26/2022   Abdominal pain 08/26/2022   Nausea and vomiting 08/26/2022   Protein-calorie  malnutrition, severe 58/85/0277   Metabolic acidosis 41/28/7867   Alcoholic ketoacidosis 67/20/9470   Hypocalcemia 02/11/2022   MDD (major depressive disorder), recurrent episode, mild (HCC) 02/11/2022   Generalized weakness 02/10/2022   Anemia, chronic disease 02/10/2022   Hypophosphatemia 02/10/2022   Dysphagia 02/10/2022   Chronic fatigue syndrome 09/21/2021   Polyarthralgia 09/21/2021   Closed displaced spiral fracture of shaft of left humerus 09/14/2021   Hepatic steatosis 08/19/2021   Bilateral optic neuropathy 03/22/2021   Retinal edema 03/22/2021   Abnormal auditory perception of both ears 03/21/2021   Pancreatitis    Hypertensive urgency 10/17/2019   Hypokalemia 10/17/2019   Hypomagnesemia 10/17/2019   Alcohol abuse 10/17/2019   Tobacco abuse 10/17/2019   Alcohol withdrawal ([place]) 10/17/2019   Lactic acidosis 10/17/2019   Confusion    Abnormal LFTs 09/05/2018   Elevated lipase 09/05/2018   History of methotrexate therapy 09/05/2018   High triglycerides 09/05/2018   Elevated liver enzymes 07/01/2018   Essential hypertension 07/01/2018   Encounter for well woman exam with routine gynecological exam 07/01/2018   History of abnormal cervical Pap smear 06/26/2017   Encounter for surveillance of injectable contraceptive 06/26/2017   Encounter for gynecological examination with Papanicolaou smear of cervix 06/26/2017   High risk medication use 06/14/2015   Rheumatoid arthritis involving multiple sites with positive rheumatoid factor ([place]) 02/24/2015   Arthritis, rheumatic, acute or subacute 11/18/2013   Urticaria, idiopathic 08/24/2013   HSV-2 (herpes simplex virus 2) infection 05/29/2013   HPV (human papilloma virus) infection 05/29/2013   PCP:  [name], MD Pharmacy:   [id number] [id number]: [phone]: [phone]    Social  Determinants of Health (SDOH) Interventions Food Insecurity Interventions: Intervention Not Indicated Housing Interventions: Intervention Not Indicated Transportation Interventions: Intervention Not Indicated Utilities Interventions: Intervention Not Indicated  Readmission Risk Interventions    08/28/2022   10:14 AM  Readmission Risk Prevention Plan  Transportation Screening Complete  PCP or Specialist Appt within 5-7 Days Complete  Home Care Screening Complete  Medication Review (RN CM) Complete

## 2022-08-28 NOTE — Progress Notes (Signed)
PROGRESS NOTE Samantha Rosales  WUJ:[id number] DOB: [birth year] DOA: 08/25/2022 PCP: Donita Brooks, MD   Brief Narrative/Hospital Course: 41 y.o.f  w/ history significant of abnormal Pap smear,GERD, history of hepatitis due to methotrexate or alcohol use, hyperlipidemia, HSV-2 infection, hypertension, hypothyroidism, rheumatoid arthritis, seizures as a child but no episodes since 89, tobacco use, multiple episodes of alcoholic pancreatitis who came into the ED w/ epigastric abdominal pain radiated to her back associated with central chest pain due to multiple episodes of nausea with emesis and also she had multiple episodes of diarrhea. She has not eaten much in the past 3 days PTA,has been drinking multiple types of alcoholic beverages.  ED course: Initial vital signs were temperature 98.5 F, pulse 86, respiration 17, BP 85/57 mmHg with an O2 sat of 99% on room air.  The patient received famotidine 20 mg IVP, 2000 mL of normal saline bolus, thiamine 100 mg IVP and 4 mg of ondansetron IVP. Labs: Urinalysis was hazy, small hemoglobinuria, ketonuria of 80 and proteinuria 100 mg/dL.  CBC showed her white at count 8.7, hemoglobin 13.6 g/dL platelets 96.  Troponin x2 normal.  Lipase was 47.  Sodium 148, potassium 3.2, chloride 97 and CO2 less than 7 mmol/L with an anion gap of 38.  Glucose 104, BUN 12 and creatinine 1.71 mg deciliter. AST 163, ALT 45 and alkaline phosphatase 167 units/L.  Total bilirubin was 1.9 mg/dL.  Magnesium was 1.5 and phosphorus 7.3 Cxr-no active disease.  CT renal stone possible terminal ileitis though this is incompletely evaluated with IV contrast.  There is marked hepatic asteatosis.  Diffuse pancreatic parenchymal calcifications compatible with sequela of chronic pancreatitis.  There was marked distention of the stomach.  Bilateral femoral head avascular necrosis.  Patient was admitted to stepdown for nausea vomiting abdominal pain alcoholic ketoacidosis AKI and significant  electrolyte imbalance  thrombocytopenia. She also had uncontrolled hyperglycemia placed on insulin drip> and overnight after gap was closed and BHOB improved she was placed on diet and placed in transitioned off insulin drip     Subjective: Seen and examined this morning.  She is ambulating in the room Taking p.o. well denies nausea vomiting diarrhea or abdominal pain. Overnight afebrile  Assessment and Plan: Principal Problem:   Alcoholic ketoacidosis Active Problems:   Hypomagnesemia   Hypophosphatemia   Alcohol abuse   Essential hypertension   Abnormal LFTs   Tobacco abuse   Anemia, chronic disease   Thrombocytopenia (HCC)   AKI (acute kidney injury) (HCC)   Abdominal pain   Nausea and vomiting   Abdomen pain w/ nausea vomiting diarrhea Possible terminal ileitis Chronic pancreatitis Alcoholic ketoacidosis possible startvation ketoacidosis: .CT stone studies- as above:possible terminal ileitis,marked hepatic asteatosis diffuse pancreatic parenchymal calcifications. Symptoms resolved no nausea vomiting diarrhea, tolerating diet> continue diet Ketoacidosis resolved.  Now on diet tolerating, changed to carb modified diet due to hyperglycemia.   Wean off IV fluids. Cont PPI, antiemetics.  Alcohol abuse Hepatic steatosis Transaminitis:: At risk of withdrawal, continue Ativan ciwa scale, cont folate, thiamine.  AST ALT 74/30 > improving as below with normal TB today.Monitor Recent Labs  Lab 08/26/22 0016 08/26/22 0447 08/27/22 0023 08/28/22 0501  AST 163* 136* 74* 77*  ALT 45* 40 30 30  ALKPHOS 167* 153* 115 110  BILITOT 1.9* 3.4* 1.2 0.8  PROT 8.9* 8.3* 6.7 6.8  ALBUMIN 4.2 4.0 3.2* 3.2*    Hyperglycemia: No known diabetes history, blood sugar stable after changing diet, A1c normal.  On SSI Recent  Labs  Lab 08/26/22 1712 08/26/22 1733 08/27/22 1134 08/27/22 1248 08/27/22 1659 08/27/22 2210 08/28/22 0728  GLUCAP  --    < > 252* 256* 134* 201* 133*  HGBA1C  4.3*  --   --   --   --   --   --    < > = values in this interval not displayed.   Acute kidney injury: In the setting of decreased oral intake/dehydration, AKI resolved.  Discontinue IV fluids  Recent Labs  Lab 08/26/22 0447 08/26/22 1543 08/26/22 1712 08/27/22 0023 08/28/22 0501  BUN 12 8 8 6  <5*  CREATININE 1.59* 1.04* 0.93 0.63 0.57    Hypokalemia Hypomagnesemia Hypophosphatemia: Again phosphorous potassium low -discussed with pharmacy replete with K-Phos iv, po kdur- recheck labs in eveening.will likely take several days to replete electrolyte deficit that she has had due to her poor oral intake/daily alcohol use and poor lifestyle.  Recent Labs  Lab 08/26/22 0447 08/26/22 1543 08/26/22 1712 08/27/22 0023 08/27/22 0827 08/27/22 1829 08/28/22 0501  K 4.4 3.4* 2.9* 2.6* 3.4* 3.6 2.4*  CALCIUM 8.2* 7.8* 7.9* 9.0  --   --  9.3  MG 1.5*  --   --   --  1.7  --   --   PHOS 7.3* 1.3*  --   --  <1.0* 2.7 1.4*    Thrombocytopenia: In the setting of alcohol abuse chronically low, she was as low as 35 K on previous admission.  Monitor, no signs of bleeding monitor Recent Labs  Lab 08/26/22 0016 08/26/22 0447 08/27/22 0023 08/28/22 0501  PLT 96* 77* 47* 35*    Essential hypertension: BP stable not on meds  Anemia of chronic disease: Hemoglobin is stable. her baseline is 9 g initially high value from hemoconcentration Recent Labs  Lab 08/26/22 0016 08/26/22 0447 08/27/22 0023 08/28/22 0501  HGB 13.6 13.2 10.0* 9.5*  HCT 45.0 42.1 28.8* 27.8*    Low BMI at 16 Nutrition Status:dietitian consulted         DVT prophylaxis: SCDs Start: 08/26/22 0351 Code Status:   Code Status: Full Code Family Communication: plan of care discussed with patient at bedside. Patient status is: Inpatient due to abnormal electrolyte status alcohol abuse  Level of care: Telemetry  Dispo: The patient is from: home            Anticipated disposition: home  Mobility Assessment (last 72  hours)     Mobility Assessment     Row Name 08/27/22 2100 08/27/22 1430         Does patient have an order for bedrest or is patient medically unstable No - Continue assessment No - Continue assessment      What is the highest level of mobility based on the progressive mobility assessment? Level 5 (Walks with assist in room/hall) - Balance while stepping forward/back and can walk in room with assist - Complete Level 5 (Walks with assist in room/hall) - Balance while stepping forward/back and can walk in room with assist - Complete                Objective: Vitals last 24 hrs: Vitals:   08/27/22 1328 08/27/22 1432 08/27/22 2213 08/28/22 0439  BP:  (!) 146/105 (!) 131/98 (!) 135/102  Pulse:  100 88 89  Resp:  18 17 18   Temp: 98.5 F (36.9 C) 98.6 F (37 C) 99 F (37.2 C) 98.4 F (36.9 C)  TempSrc: Oral Oral    SpO2:  100% 100%  100%  Weight:      Height:       Weight change:   Physical Examination: General exam: AAox3,comfortable,older than stated age, weak appearing. HEENT:Oral mucosa moist, Ear/Nose WNL grossly, dentition normal. Respiratory system: bilaterally clear,no use of accessory muscle Cardiovascular system: S1 & S2 +, No JVD,. Gastrointestinal system: Abdomen soft,NT,ND,BS+ Nervous System:Alert, awake, moving extremities and grossly nonfocal Extremities: LE ankle edema neg, distal peripheral pulses palpable.  Skin: No rashes,no icterus. MSK: Normal muscle bulk,tone, power   Medications reviewed:  Scheduled Meds:  folic acid  1 mg Oral Daily   insulin aspart  0-9 Units Subcutaneous TID WC   LORazepam  0-4 mg Oral Q8H   multivitamin with minerals  1 tablet Oral Daily   nicotine  14 mg Transdermal Daily   pantoprazole (PROTONIX) IV  40 mg Intravenous Daily   potassium chloride  40 mEq Oral Q2H   thiamine  100 mg Oral Daily   Or   thiamine  100 mg Intravenous Daily   Continuous Infusions:  lactated ringers 75 mL/hr at 08/28/22 0518   potassium  PHOSPHATE IVPB (in mmol) 30 mmol (08/28/22 1046)      Diet Order             Diet Carb Modified Fluid consistency: Thin; Room service appropriate? Yes  Diet effective now                  Unresulted Labs (From admission, onward)     Start     Ordered   08/28/22 1800  Potassium  Once-Timed,   TIMED       Question:  Specimen collection method  Answer:  Lab=Lab collect   08/28/22 1043   08/28/22 0957  Magnesium  Add-on,   AD       Question:  Specimen collection method  Answer:  Lab=Lab collect   08/28/22 0956   08/27/22 0500  Comprehensive metabolic panel  Daily at 5am,   R      08/26/22 1102   08/27/22 0500  CBC  Daily at 5am,   R      08/26/22 1102          Data Reviewed: I have personally reviewed following labs and imaging studies CBC: Recent Labs  Lab 08/26/22 0016 08/26/22 0447 08/27/22 0023 08/28/22 0501  WBC 8.7 8.5 6.0 3.6*  NEUTROABS  --  7.1  --   --   HGB 13.6 13.2 10.0* 9.5*  HCT 45.0 42.1 28.8* 27.8*  MCV 110.8* 104.5* 94.7 95.5  PLT 96* 77* 47* 35*   Basic Metabolic Panel: Recent Labs  Lab 08/26/22 0447 08/26/22 1543 08/26/22 1712 08/27/22 0023 08/27/22 0827 08/27/22 1829 08/28/22 0501  NA 139 134* 133* 139  --   --  137  K 4.4 3.4* 2.9* 2.6* 3.4* 3.6 2.4*  CL 94* 104 103 107  --   --  103  CO2 7* 14* 16* 23  --   --  24  GLUCOSE 146* 434* 392* 152*  --   --  190*  BUN 12 8 8 6   --   --  <5*  CREATININE 1.59* 1.04* 0.93 0.63  --   --  0.57  CALCIUM 8.2* 7.8* 7.9* 9.0  --   --  9.3  MG 1.5*  --   --   --  1.7  --   --   PHOS 7.3* 1.3*  --   --  <1.0* 2.7 1.4*   GFR: Estimated  Creatinine Clearance: 70.6 mL/min (by C-G formula based on SCr of 0.57 mg/dL). Liver Function Tests: Recent Labs  Lab 08/26/22 0016 08/26/22 0447 08/27/22 0023 08/28/22 0501  AST 163* 136* 74* 77*  ALT 45* 40 30 30  ALKPHOS 167* 153* 115 110  BILITOT 1.9* 3.4* 1.2 0.8  PROT 8.9* 8.3* 6.7 6.8  ALBUMIN 4.2 4.0 3.2* 3.2*   Recent Labs  Lab  08/26/22 0016  LIPASE 47   No results for input(s): "AMMONIA" in the last 168 hours. Coagulation Profile: No results for input(s): "INR", "PROTIME" in the last 168 hours. BNP (last 3 results) No results for input(s): "PROBNP" in the last 8760 hours. HbA1C: Recent Labs    08/26/22 1712  HGBA1C 4.3*   CBG: Recent Labs  Lab 08/27/22 1134 08/27/22 1248 08/27/22 1659 08/27/22 2210 08/28/22 0728  GLUCAP 252* 256* 134* 201* 133*   Lipid Profile: No results for input(s): "CHOL", "HDL", "LDLCALC", "TRIG", "CHOLHDL", "LDLDIRECT" in the last 72 hours. Thyroid Function Tests: No results for input(s): "TSH", "T4TOTAL", "FREET4", "T3FREE", "THYROIDAB" in the last 72 hours. Sepsis Labs: Recent Labs  Lab 08/26/22 1110  LATICACIDVEN 2.0*    Recent Results (from the past 240 hour(s))  MRSA Next Gen by PCR, Nasal     Status: None   Collection Time: 08/26/22  1:54 PM   Specimen: Nasal Mucosa; Nasal Swab  Result Value Ref Range Status   MRSA by PCR Next Gen NOT DETECTED NOT DETECTED Final    Comment: (NOTE) The GeneXpert MRSA Assay (FDA approved for NASAL specimens only), is one component of a comprehensive MRSA colonization surveillance program. It is not intended to diagnose MRSA infection nor to guide or monitor treatment for MRSA infections. Test performance is not FDA approved in patients less than 14 years old. Performed at [facility], [address] [place], [place] [address]     Antimicrobials: Anti-infectives (From admission, onward)    None      Culture/Microbiology    Component Value Date/Time   SDES  06/20/2022 0931    BLOOD RIGHT ANTECUBITAL Performed at [facility], [address] [place], [place] [address]    SPECREQUEST  06/20/2022 0931    BOTTLES DRAWN AEROBIC AND ANAEROBIC Blood Culture results may not be optimal due to an inadequate volume of blood received in culture bottles Performed at [facility], [address], [place], [place] [address]    CULT  06/20/2022 0931    NO GROWTH 5 DAYS Performed at [facility], [address] [place], [place] [address]    REPTSTATUS 06/25/2022 FINAL 06/20/2022 5003   Other culture-see note  Radiology Studies: No results found.   LOS: 1 day   Lanae Boast, MD [facility]  08/28/2022, 11:20 AM

## 2022-08-28 NOTE — Plan of Care (Signed)

## 2022-08-28 NOTE — Progress Notes (Addendum)
Initial Nutrition Assessment  DOCUMENTATION CODES:   Severe malnutrition in context of chronic illness, Underweight  INTERVENTION:   -Ensure Plus High Protein po BID, each supplement provides 350 kcal and 20 grams of protein.   -Checking vitamin labs: Vitamin A and Zinc levels given alcohol abuse, and poor PO intakes. RD to follow and can alert PCP of abnormal labs results.  NUTRITION DIAGNOSIS:   Severe Malnutrition related to chronic illness (alcohol abuse, pancreatitis) as evidenced by moderate fat depletion, moderate muscle depletion, percent weight loss, energy intake < or equal to 75% for > or equal to 1 month.  GOAL:   Patient will meet greater than or equal to 90% of their needs  MONITOR:   PO intake, Supplement acceptance, Labs, Weight trends, I & O's, Skin  REASON FOR ASSESSMENT:   Consult Assessment of nutrition requirement/status  ASSESSMENT:   41 y.o.f  w/ history significant of abnormal Pap smear,GERD, history of hepatitis due to methotrexate or alcohol use, hyperlipidemia, HSV-2 infection, hypertension, hypothyroidism, rheumatoid arthritis, seizures as a child but no episodes since 89, tobacco use, multiple episodes of alcoholic pancreatitis who came into the ED w/ epigastric abdominal pain radiated to her back associated with central chest pain due to multiple episodes of nausea with emesis and also she had multiple episodes of diarrhea. She has not eaten much in the past 3 days PTA,has been drinking multiple types of alcoholic beverages.  Patient in room, now on CHO modified diet, has no complaints about diet. Pt states she has not been eating well for some time now. States she has been drinking alcohol daily (~4-6 drinks), and this greatly affects her appetite. She does have some days she would eat 3 meals a day but would go 2-3 days without eating anything. States she sometimes forgets to take some of the vitamin supplements she is supposed to. Current takes:  Vitamin D3, biotin, women's once/day, Ca, Mg, Thiamine.  Per chart review, pt has been worked up for nutritional deficiencies this past year. Reports she easily bruises and has had tingling in her toes. Was placed on thiamine and folic acid in July 1610. 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 Had copper and Vitamin B-6 levels checked in July 2023 which were WNL.  Will check Vitamin A and zinc levels given alcohol abuse, pancreatic insufficiency, and N/V and diarrhea. Will follow and alert PCP of results in case pt is discharged.  Currently consuming 75% of meals. Pt is agreeable to drinking Ensure this admission. Discussed her vitamin supplementation and agreed to having additional vitamin labs checked.  Pt states she weighed ~175 lbs a few years ago. Per weight records, pt has lost 22 lbs   Medications: Folic acid, Multivitamin with minerals daily, KLOR-CON, Thiamine, Lactated ringers, K-Phos  Labs reviewed: CBGs: 80-256  Low K Low Phos  NUTRITION - FOCUSED PHYSICAL EXAM:  Flowsheet Row Most Recent Value  Orbital Region Mild depletion  Upper Arm Region Severe depletion  Thoracic and Lumbar Region Mild depletion  Buccal Region Mild depletion  Temple Region Moderate depletion  Clavicle Bone Region Moderate depletion  Clavicle and Acromion Bone Region Moderate depletion  Scapular Bone Region Moderate depletion  Dorsal Hand Moderate depletion  Patellar Region Moderate depletion  Anterior Thigh Region Moderate depletion  Posterior Calf Region Moderate depletion  Edema (RD Assessment) None  Hair Reviewed  Eyes Reviewed  Mouth Reviewed  Skin Reviewed  [dry]  Nails Reviewed       Diet Order:  Diet Order             Diet Carb Modified Fluid consistency: Thin; Room service appropriate? Yes  Diet effective now                   EDUCATION NEEDS:   Education needs have been addressed  Skin:  Skin Assessment: Reviewed RN Assessment  Last BM:   9/21  Height:   Ht Readings from Last 1 Encounters:  08/27/22 5\' 8"  (1.727 m)    Weight:   Wt Readings from Last 1 Encounters:  08/27/22 48.8 kg    BMI:  Body mass index is 16.36 kg/m.  Estimated Nutritional Needs:   Kcal:  1900-2100  Protein:  90-100g  Fluid:  2L/day  Clayton Bibles, MS, RD, LDN Inpatient Clinical Dietitian Contact information available via Amion

## 2022-08-29 DIAGNOSIS — E8729 Other acidosis: Secondary | ICD-10-CM | POA: Diagnosis not present

## 2022-08-29 LAB — POTASSIUM: Potassium: 3.6 mmol/L (ref 3.5–5.1)

## 2022-08-29 LAB — CBC
HCT: 29.1 % — ABNORMAL LOW (ref 36.0–46.0)
Hemoglobin: 9.8 g/dL — ABNORMAL LOW (ref 12.0–15.0)
MCH: 32.6 pg (ref 26.0–34.0)
MCHC: 33.7 g/dL (ref 30.0–36.0)
MCV: 96.7 fL (ref 80.0–100.0)
Platelets: 46 10*3/uL — ABNORMAL LOW (ref 150–400)
RBC: 3.01 MIL/uL — ABNORMAL LOW (ref 3.87–5.11)
RDW: 14.1 % (ref 11.5–15.5)
WBC: 2.7 10*3/uL — ABNORMAL LOW (ref 4.0–10.5)
nRBC: 0 % (ref 0.0–0.2)

## 2022-08-29 LAB — BASIC METABOLIC PANEL
Anion gap: 10 (ref 5–15)
BUN: 5 mg/dL — ABNORMAL LOW (ref 6–20)
CO2: 25 mmol/L (ref 22–32)
Calcium: 9 mg/dL (ref 8.9–10.3)
Chloride: 107 mmol/L (ref 98–111)
Creatinine, Ser: 0.39 mg/dL — ABNORMAL LOW (ref 0.44–1.00)
GFR, Estimated: >60 mL/min (ref >60)
Glucose, Bld: 116 mg/dL — ABNORMAL HIGH (ref 70–99)
Potassium: 3.1 mmol/L — ABNORMAL LOW (ref 3.5–5.1)
Sodium: 142 mmol/L (ref 135–145)

## 2022-08-29 LAB — MAGNESIUM: Magnesium: 1.6 mg/dL — ABNORMAL LOW (ref 1.7–2.4)

## 2022-08-29 LAB — GLUCOSE, CAPILLARY
Glucose-Capillary: 117 mg/dL — ABNORMAL HIGH (ref 70–99)
Glucose-Capillary: 200 mg/dL — ABNORMAL HIGH (ref 70–99)

## 2022-08-29 LAB — PHOSPHORUS: Phosphorus: 3.8 mg/dL (ref 2.5–4.6)

## 2022-08-29 MED ORDER — MAGNESIUM OXIDE -MG SUPPLEMENT 400 (240 MG) MG PO TABS
400.0000 mg | ORAL_TABLET | Freq: Two times a day (BID) | ORAL | Status: DC
  Administered 2022-08-29: 400 mg via ORAL
  Filled 2022-08-29: qty 1

## 2022-08-29 MED ORDER — THIAMINE HCL 100 MG PO TABS: 100.0000 mg | ORAL_TABLET | Freq: Two times a day (BID) | ORAL | 0 refills | Status: AC

## 2022-08-29 MED ORDER — FOLIC ACID 1 MG PO TABS: 1.0000 mg | ORAL_TABLET | Freq: Every day | ORAL | 0 refills | Status: AC

## 2022-08-29 MED ORDER — MAGNESIUM SULFATE 2 GM/50ML IV SOLN: 2.0000 g | Freq: Once | INTRAVENOUS | Status: DC

## 2022-08-29 MED ORDER — ENSURE ENLIVE PO LIQD: 237.0000 mL | Freq: Two times a day (BID) | ORAL | 0 refills | Status: DC

## 2022-08-29 MED ORDER — MAGNESIUM OXIDE -MG SUPPLEMENT 400 (240 MG) MG PO TABS: 400.0000 mg | ORAL_TABLET | Freq: Two times a day (BID) | ORAL | 0 refills | Status: AC

## 2022-08-29 MED ORDER — POTASSIUM CHLORIDE CRYS ER 20 MEQ PO TBCR: 40.0000 meq | EXTENDED_RELEASE_TABLET | Freq: Once | ORAL | Status: DC

## 2022-08-29 MED ORDER — MAGNESIUM SULFATE 2 GM/50ML IV SOLN
2.0000 g | Freq: Once | INTRAVENOUS | Status: AC
  Administered 2022-08-29: 2 g via INTRAVENOUS
  Filled 2022-08-29: qty 50

## 2022-08-29 MED ORDER — POTASSIUM CHLORIDE CRYS ER 20 MEQ PO TBCR
40.0000 meq | EXTENDED_RELEASE_TABLET | Freq: Once | ORAL | Status: AC
  Administered 2022-08-29: 40 meq via ORAL
  Filled 2022-08-29: qty 2

## 2022-08-29 NOTE — Plan of Care (Signed)
Patient is stable at this time and no needs verbalized

## 2022-08-29 NOTE — Inpatient Diabetes Management (Signed)
Inpatient Diabetes Program Recommendations  AACE/ADA: New Consensus Statement on Inpatient Glycemic Control (2015)  Target Ranges:  Prepandial:   less than 140 mg/dL      Peak postprandial:   less than 180 mg/dL (1-2 hours)      Critically ill patients:  140 - 180 mg/dL   Lab Results  Component Value Date   GLUCAP 200 (H) 08/29/2022   HGBA1C 4.3 (L) 08/26/2022    Review of Glycemic Control  Diabetes history: None Outpatient Diabetes medications: N/A Current orders for Inpatient glycemic control: Novolog 0-9 units TID  HgbA1C - 4.3% No family hx DM CBG 200 mg/dL, pt states she had just drank 2 OJs and taken a supplement.  Inpatient Diabetes Program Recommendations:    F/U with PCP regarding hospital hyperglycemia.  Pt had no questions. Instructed her to f/u with her PCP after hospitalization.  Thank you. Lorenda Peck, RD, LDN, [facility] Inpatient Diabetes Coordinator [phone]

## 2022-08-29 NOTE — Discharge Summary (Signed)
Physician Discharge Summary  Samantha Rosales DOB: 1981/06/29 DOA: 08/25/2022  PCP: [name], MD  Admit date: 08/25/2022 Discharge date: 08/29/2022 Recommendations for Outpatient Follow-up:  Follow up with PCP in 1 weeks-call for appointment Continue with your potassium and magnesium supplement Please obtain BMP/CBC in one week  Discharge Dispo: Home Discharge Condition: Stable Code Status:   Code Status: Full Code Diet recommendation:  Diet Order             Diet Carb Modified Fluid consistency: Thin; Room service appropriate? Yes  Diet effective now                    Brief/Interim Summary: 41 y.o.f  w/ history significant of abnormal Pap smear,GERD, history of hepatitis due to methotrexate or alcohol use, hyperlipidemia, HSV-2 infection, hypertension, hypothyroidism, rheumatoid arthritis, seizures as a child but no episodes since 89, tobacco use, multiple episodes of alcoholic pancreatitis who came into the ED w/ epigastric abdominal pain radiated to her back associated with central chest pain due to multiple episodes of nausea with emesis and also she had multiple episodes of diarrhea. She has not eaten much in the past 3 days PTA,has been drinking multiple types of alcoholic beverages.  ED course: Initial vital signs were temperature 98.5 F, pulse 86, respiration 17, BP 85/57 mmHg with an O2 sat of 99% on room air.  The patient received famotidine 20 mg IVP, 2000 mL of normal saline bolus, thiamine 100 mg IVP and 4 mg of ondansetron IVP. Labs: Urinalysis was hazy, small hemoglobinuria, ketonuria of 80 and proteinuria 100 mg/dL.  CBC showed her white at count 8.7, hemoglobin 13.6 g/dL platelets 96.  Troponin x2 normal.  Lipase was 47.  Sodium 148, potassium 3.2, chloride 97 and CO2 less than 7 mmol/L with an anion gap of 38.  Glucose 104, BUN 12 and creatinine 1.71 mg deciliter. AST 163, ALT 45 and alkaline phosphatase 167 units/L.  Total bilirubin was 1.9 mg/dL.   Magnesium was 1.5 and phosphorus 7.3 Cxr-no active disease.  CT renal stone possible terminal ileitis though this is incompletely evaluated with IV contrast.  There is marked hepatic asteatosis.  Diffuse pancreatic parenchymal calcifications compatible with sequela of chronic pancreatitis.  There was marked distention of the stomach.  Bilateral femoral head avascular necrosis. Patient was admitted to stepdown for nausea vomiting abdominal pain alcoholic ketoacidosis AKI and significant electrolyte imbalance  thrombocytopenia. She also had uncontrolled hyperglycemia placed on insulin drip> and overnight after gap was closed and BHOB improved she was placed on diet and placed in transitioned off insulin drip. Patient to follow-up with PCP regarding hyperglycemia with normal A1c. Or your lites at this time has stabilized and improved potassium 3.6 mag 1.6 phosphorus normalized.  She is supposed to be on daily potassium supplement I have reminded her that encouraged to take potassium rich food.  She feels well has been ambulating using a shower .  Platelet counts are trending up slowly and will need to be followed up by her primary care doctor with CBC check in a week   Discharge Diagnoses:  Principal Problem:   Alcoholic ketoacidosis Active Problems:   Hypomagnesemia   Hypophosphatemia   Alcohol abuse   Essential hypertension   Abnormal LFTs   Tobacco abuse   Anemia, chronic disease   Thrombocytopenia (HCC)   AKI (acute kidney injury) ([facility])   Abdominal pain   Nausea and vomiting  Abdomen pain w/ nausea vomiting diarrhea Possible terminal ileitis  Chronic pancreatitis Alcoholic ketoacidosis possible startvation ketoacidosis: CT stone studies- as above:possible terminal ileitis,marked hepatic asteatosis diffuse pancreatic parenchymal calcifications. Symptoms resolved no nausea vomiting diarrhea, tolerating diet> continue diet Ketoacidosis resolved.  Electrolytes stable, continue her Creon,  continue diet, continue alcohol cessation.   Alcohol abuse Hepatic steatosis Transaminitis: No signs of withdrawal at this time doing well continue thiamine folate, LFTs have improved except for AST at 77, follow-up with PCP, continue alcohol cessation.   Recent Labs  Lab 08/26/22 0016 08/26/22 0447 08/27/22 0023 08/28/22 0501  AST 163* 136* 74* 77*  ALT 45* 40 30 30  ALKPHOS 167* 153* 115 110  BILITOT 1.9* 3.4* 1.2 0.8  PROT 8.9* 8.3* 6.7 6.8  ALBUMIN 4.2 4.0 3.2* 3.2*    Hyperglycemia: No known diabetes history, blood sugar stable after changing diet, A1c normal Recent Labs  Lab 08/26/22 1712 08/26/22 1733 08/28/22 1142 08/28/22 1736 08/28/22 2102 08/29/22 0743 08/29/22 1132  GLUCAP  --    < > 177* 173* 153* 117* 200*  HGBA1C 4.3*  --   --   --   --   --   --    < > = values in this interval not displayed.    Hypokalemia Hypomagnesemia Hypophosphatemia: Improved with replacement, she is supposed to take potassium supplement at home, also given magnesium supplement, encourage potassium rich diet.  Thrombocytopenia in the setting of chronic alcohol abuse, platelet low as low 35,000 in the past as well, now trending up in 45k follow-up with PCP with CBC check in a week Hypertension BP stable continue home meds on discharge follow-up with PCP Anemia of chronic disease hemoglobin is stable initially high value from hemoconcentration Severe malnutrition seen by dietitian augment nutritional status continue alcohol cessation. Nutrition Problem: Severe Malnutrition Etiology: chronic illness (alcohol abuse, pancreatitis) Signs/Symptoms: moderate fat depletion, moderate muscle depletion, percent weight loss, energy intake < or equal to 75% for > or equal to 1 month Interventions: Ensure Enlive (each supplement provides 350kcal and 20 grams of protein), MVI    Consults: none Subjective: Alert oriented resting comfortably.  Wants to go home today.  Tolerating diet.   Ambulating.  Discharge Exam: Vitals:   08/29/22 0448 08/29/22 1258  BP: (!) 119/98 (!) 126/99  Pulse: 95 89  Resp: 17 18  Temp: 98.3 F (36.8 C) 99 F (37.2 C)  SpO2: 100% 100%   General: Pt is alert, awake, not in acute distress Cardiovascular: RRR, S1/S2 +, no rubs, no gallops Respiratory: CTA bilaterally, no wheezing, no rhonchi Abdominal: Soft, NT, ND, bowel sounds + Extremities: no edema, no cyanosis  Discharge Instructions  Discharge Instructions     Discharge instructions   Complete by: As directed    Follow-up with your primary care doctor within a week to check your potassium level, continue taking a potassium supplement may have refills still left  Please call call MD or return to ER for similar or worsening recurring problem that brought you to hospital or if any fever,nausea/vomiting,abdominal pain, uncontrolled pain, chest pain,  shortness of breath or any other alarming symptoms.  Please follow-up your doctor as instructed in a week time and call the office for appointment.  Please avoid alcohol, smoking, or any other illicit substance and maintain healthy habits including taking your regular medications as prescribed.  You were cared for by a hospitalist during your hospital stay. If you have any questions about your discharge medications or the care you received while you were in the hospital after you  are discharged, you can call the unit and ask to speak with the hospitalist on call if the hospitalist that took care of you is not available.  Once you are discharged, your primary care physician will handle any further medical issues. Please note that NO REFILLS for any discharge medications will be authorized once you are discharged, as it is imperative that you return to your primary care physician (or establish a relationship with a primary care physician if you do not have one) for your aftercare needs so that they can reassess your need for medications and  monitor your lab values   Increase activity slowly   Complete by: As directed       Allergies as of 08/29/2022       Reactions   Penicillins Hives   Has patient had a PCN reaction causing immediate rash, facial/tongue/throat swelling, SOB or lightheadedness with hypotension: Unknown Has patient had a PCN reaction causing severe rash involving mucus membranes or skin necrosis: Unknown Has patient had a PCN reaction that required hospitalization: Unknown Has patient had a PCN reaction occurring within the last 10 years: No If all of the above answers are "NO", then may proceed with Cephalosporin use.        Medication List     TAKE these medications    BIOTIN PO Take 1 tablet by mouth daily.   blood glucose meter kit and supplies Kit Dispense based on patient and insurance preference. Use up to four times daily as directed. What changed:  how much to take how to take this when to take this   CALCIUM PO Take 1 tablet by mouth daily.   cycloSPORINE 0.05 % ophthalmic emulsion Commonly known as: RESTASIS Place 1 drop into both eyes 2 (two) times daily as needed (watery eyes/irritation).   escitalopram 10 MG tablet Commonly known as: LEXAPRO Take 1 tablet (10 mg total) by mouth daily. What changed:  when to take this reasons to take this   feeding supplement Liqd Take 237 mLs by mouth 2 (two) times daily with a meal for 7 days.   folic acid 1 MG tablet Commonly known as: FOLVITE Take 1 tablet (1 mg total) by mouth daily.   Humira 40 MG/0.4ML Pskt Generic drug: Adalimumab Inject 40 mg into the skin every 30 (thirty) days.   lipase/protease/amylase 36000 UNITS Cpep capsule Commonly known as: Creon Take 2 capsules (72,000 Units total) by mouth 3 (three) times daily with meals. May also take 1 capsule (36,000 Units total) as needed (with snacks).   losartan 50 MG tablet Commonly known as: COZAAR Take 1 tablet (50 mg total) by mouth daily.   magnesium oxide 400  (240 Mg) MG tablet Commonly known as: MAG-OX Take 1 tablet (400 mg total) by mouth 2 (two) times daily for 3 days.   MAGNESIUM PO Take 1 tablet by mouth daily.   nebivolol 10 MG tablet Commonly known as: Bystolic Take 1 tablet (10 mg total) by mouth daily.   PANTOPRAZOLE SODIUM PO Take 1 tablet by mouth daily before breakfast.   potassium chloride SA 20 MEQ tablet Commonly known as: KLOR-CON M Take 2 tablets (40 mEq total) by mouth daily.   thiamine 100 MG tablet Commonly known as: VITAMIN B1 Take 1 tablet (100 mg total) by mouth 2 (two) times daily.   VITAMIN D3 PO Take 1 tablet by mouth daily.        Follow-up Information     [name], MD Follow up  in 1 week(s).   Specialty: Family Medicine Contact information: [address] [place] [address] [phone]                Allergies  Allergen Reactions   Penicillins Hives    Has patient had a PCN reaction causing immediate rash, facial/tongue/throat swelling, SOB or lightheadedness with hypotension: Unknown Has patient had a PCN reaction causing severe rash involving mucus membranes or skin necrosis: Unknown Has patient had a PCN reaction that required hospitalization: Unknown Has patient had a PCN reaction occurring within the last 10 years: No If all of the above answers are "NO", then may proceed with Cephalosporin use.     The results of significant diagnostics from this hospitalization (including imaging, microbiology, ancillary and laboratory) are listed below for reference.    Microbiology: Recent Results (from the past 240 hour(s))  MRSA Next Gen by PCR, Nasal     Status: None   Collection Time: 08/26/22  1:54 PM   Specimen: Nasal Mucosa; Nasal Swab  Result Value Ref Range Status   MRSA by PCR Next Gen NOT DETECTED NOT DETECTED Final    Comment: (NOTE) The GeneXpert MRSA Assay (FDA approved for NASAL specimens only), is one component of a comprehensive MRSA colonization  surveillance program. It is not intended to diagnose MRSA infection nor to guide or monitor treatment for MRSA infections. Test performance is not FDA approved in patients less than 60 years old. Performed at [facility], [place] [address], [place], [place] [address]     Procedures/Studies: CT Renal Stone Study  Result Date: 08/26/2022 CLINICAL DATA:  Flank pain; kidney stones suspected EXAM: CT ABDOMEN AND PELVIS WITHOUT CONTRAST TECHNIQUE: Multidetector CT imaging of the abdomen and pelvis was performed following the standard protocol without IV contrast. RADIATION DOSE REDUCTION: This exam was performed according to the departmental dose-optimization program which includes automated exposure control, adjustment of the mA and/or kV according to patient size and/or use of iterative reconstruction technique. COMPARISON:  CT abdomen and pelvis 05/28/2020 FINDINGS: Lower chest: No acute abnormality. Hepatobiliary: Marked hepatic steatosis. No focal liver lesion. No evidence of cholecystitis. Pancreas: Diffuse pancreatic parenchymal calcifications compatible with sequela of chronic pancreatitis. Spleen: Normal in size without focal abnormality. Adrenals/Urinary Tract: Unremarkable adrenal glands. Normal noncontrast appearance of the kidneys. No hydronephrosis or urinary calculi. Unremarkable bladder. Stomach/Bowel: Normal caliber large and small bowel. Normal appendix. Distended stomach. Wall thickening about the terminal ileum. No significant adjacent inflammatory stranding. Vascular/Lymphatic: Mild aortic atherosclerosis. No lymphadenopathy. Reproductive: Unremarkable. Other: No free intraperitoneal air or fluid. Musculoskeletal: Serpiginous sclerosis in both femoral heads compatible with avascular necrosis. No subchondral collapse. Mild sclerosis both SI joints. No acute osseous abnormality. IMPRESSION: 1. Possible terminal ileitis though this is incompletely evaluated without IV  contrast. 2. Marked hepatic steatosis. 3. Diffuse pancreatic parenchymal calcifications compatible with sequela of chronic pancreatitis. 4. Marked distention of the stomach. 5. Bilateral femoral head avascular necrosis. Electronically Signed   By: Placido Sou M.D.   On: 08/26/2022 04:05   DG Chest Port 1 View  Result Date: 08/26/2022 CLINICAL DATA:  Central chest pain EXAM: PORTABLE CHEST 1 VIEW COMPARISON:  Radiographs 06/20/2022 FINDINGS: No focal consolidation, pleural effusion, or pneumothorax. Normal cardiomediastinal silhouette. No acute osseous abnormality. IMPRESSION: No active disease. Electronically Signed   By: Placido Sou M.D.   On: 08/26/2022 01:12    Labs: BNP (last 3 results) No results for input(s): "BNP" in the last 8760 hours. Basic Metabolic  Panel: Recent Labs  Lab 08/26/22 0447 08/26/22 1543 08/26/22 1712 08/27/22 0023 08/27/22 0827 08/27/22 1829 08/28/22 0501 08/28/22 0517 08/29/22 0545 08/29/22 1340  NA 139 134* 133* 139  --   --  137  --  142  --   K 4.4 3.4* 2.9* 2.6* 3.4* 3.6 2.4*  --  3.1* 3.6  CL 94* 104 103 107  --   --  103  --  107  --   CO2 7* 14* 16* 23  --   --  24  --  25  --   GLUCOSE 146* 434* 392* 152*  --   --  190*  --  116*  --   BUN 12 8 8 6   --   --  <5*  --  5*  --   CREATININE 1.59* 1.04* 0.93 0.63  --   --  0.57  --  0.39*  --   CALCIUM 8.2* 7.8* 7.9* 9.0  --   --  9.3  --  9.0  --   MG 1.5*  --   --   --  1.7  --   --  2.0 1.6*  --   PHOS 7.3* 1.3*  --   --  <1.0* 2.7 1.4*  --  3.8  --    Liver Function Tests: Recent Labs  Lab 08/26/22 0016 08/26/22 0447 08/27/22 0023 08/28/22 0501  AST 163* 136* 74* 77*  ALT 45* 40 30 30  ALKPHOS 167* 153* 115 110  BILITOT 1.9* 3.4* 1.2 0.8  PROT 8.9* 8.3* 6.7 6.8  ALBUMIN 4.2 4.0 3.2* 3.2*   Recent Labs  Lab 08/26/22 0016  LIPASE 47   No results for input(s): "AMMONIA" in the last 168 hours. CBC: Recent Labs  Lab 08/26/22 0016 08/26/22 0447 08/27/22 0023 08/28/22 0501  08/29/22 0545  WBC 8.7 8.5 6.0 3.6* 2.7*  NEUTROABS  --  7.1  --   --   --   HGB 13.6 13.2 10.0* 9.5* 9.8*  HCT 45.0 42.1 28.8* 27.8* 29.1*  MCV 110.8* 104.5* 94.7 95.5 96.7  PLT 96* 77* 47* 35* 46*   Cardiac Enzymes: No results for input(s): "CKTOTAL", "CKMB", "CKMBINDEX", "TROPONINI" in the last 168 hours. BNP: Invalid input(s): "POCBNP" CBG: Recent Labs  Lab 08/28/22 1142 08/28/22 1736 08/28/22 2102 08/29/22 0743 08/29/22 1132  GLUCAP 177* 173* 153* 117* 200*   D-Dimer No results for input(s): "DDIMER" in the last 72 hours. Hgb A1c Recent Labs    08/26/22 1712  HGBA1C 4.3*   Lipid Profile No results for input(s): "CHOL", "HDL", "LDLCALC", "TRIG", "CHOLHDL", "LDLDIRECT" in the last 72 hours. Thyroid function studies No results for input(s): "TSH", "T4TOTAL", "T3FREE", "THYROIDAB" in the last 72 hours.  Invalid input(s): "FREET3" Anemia work up No results for input(s): "VITAMINB12", "FOLATE", "FERRITIN", "TIBC", "IRON", "RETICCTPCT" in the last 72 hours. Urinalysis    Component Value Date/Time   COLORURINE YELLOW 08/26/2022 0656   APPEARANCEUR HAZY (A) 08/26/2022 0656   LABSPEC 1.012 08/26/2022 0656   PHURINE 6.0 08/26/2022 0656   GLUCOSEU NEGATIVE 08/26/2022 0656   HGBUR SMALL (A) 08/26/2022 0656   BILIRUBINUR NEGATIVE 08/26/2022 0656   KETONESUR 80 (A) 08/26/2022 0656   PROTEINUR 100 (A) 08/26/2022 0656   UROBILINOGEN 0.2 12/13/2012 1517   NITRITE NEGATIVE 08/26/2022 0656   LEUKOCYTESUR NEGATIVE 08/26/2022 0656   Sepsis Labs Recent Labs  Lab 08/26/22 0447 08/27/22 0023 08/28/22 0501 08/29/22 0545  WBC 8.5 6.0 3.6* 2.7*   Microbiology Recent Results (  from the past 240 hour(s))  MRSA Next Gen by PCR, Nasal     Status: None   Collection Time: 08/26/22  1:54 PM   Specimen: Nasal Mucosa; Nasal Swab  Result Value Ref Range Status   MRSA by PCR Next Gen NOT DETECTED NOT DETECTED Final    Comment: (NOTE) The GeneXpert MRSA Assay (FDA approved for  NASAL specimens only), is one component of a comprehensive MRSA colonization surveillance program. It is not intended to diagnose MRSA infection nor to guide or monitor treatment for MRSA infections. Test performance is not FDA approved in patients less than 14 years old. Performed at [facility], [place] [address] [place], [place] [address]      Time coordinating discharge: 25 minutes  SIGNED: Antonieta Pert, MD  [facility] 08/29/2022, 2:33 PM  If 7PM-7AM, please contact night-coverage [email or web address]

## 2022-08-29 NOTE — Plan of Care (Signed)

## 2022-08-29 NOTE — Plan of Care (Signed)
Patient will be discharging home, she is in stable conditions and without complaints at this time.

## 2022-08-31 ENCOUNTER — Telehealth: Payer: Self-pay

## 2022-08-31 LAB — VITAMIN A: Vitamin A (Retinoic Acid): 24.4 ug/dL (ref 20.1–62.0)

## 2022-08-31 NOTE — Telephone Encounter (Signed)
Transition Care Management Follow-up Telephone Call Date of discharge and from where: 08/29/2022 How have you been since you were released from the hospital? Patient states that she is feeling better. Patient stated that she has the pharmacy refilliing all of her medications and will let me know if she has any difficulty. Patient asked about the vitamin A lab work. Informed that the vitamin A and the Zinc has not resulted yet.  Any questions or concerns? No  Items Reviewed: Did the pt receive and understand the discharge instructions provided? Yes  Medications obtained and verified? Yes  Other? No  Any new allergies since your discharge? No  Dietary orders reviewed? No Do you have support at home? Yes   Functional Questionnaire: (I = Independent and D = Dependent) ADLs: I  Bathing/Dressing- I  Meal Prep- I  Eating- I  Maintaining continence- I  Transferring/Ambulation- I  Managing Meds- I   Follow up appointments reviewed:  PCP Hospital f/u appt confirmed? No  Patient stated that she is calling PCP to schedule 1 wk follow up appt.  [facility] f/u appt confirmed? No   Are transportation arrangements needed? No  If their condition worsens, is the pt aware to call PCP or go to the Emergency Dept.? Yes Was the patient provided with contact information for the PCP's office or ED? Yes Was to pt encouraged to call back with questions or concerns? Yes

## 2022-09-03 DIAGNOSIS — Z419 Encounter for procedure for purposes other than remedying health state, unspecified: Secondary | ICD-10-CM | POA: Diagnosis not present

## 2022-09-05 ENCOUNTER — Ambulatory Visit (INDEPENDENT_AMBULATORY_CARE_PROVIDER_SITE_OTHER): Payer: BC Managed Care – PPO | Admitting: Family Medicine

## 2022-09-05 VITALS — BP 100/68 | HR 82 | Temp 98.6°F | Ht 68.0 in | Wt 110.0 lb

## 2022-09-05 DIAGNOSIS — K8689 Other specified diseases of pancreas: Secondary | ICD-10-CM | POA: Diagnosis not present

## 2022-09-05 DIAGNOSIS — E8729 Other acidosis: Secondary | ICD-10-CM

## 2022-09-05 DIAGNOSIS — E1165 Type 2 diabetes mellitus with hyperglycemia: Secondary | ICD-10-CM | POA: Diagnosis not present

## 2022-09-05 LAB — ZINC: Zinc: 62 ug/dL (ref 44–115)

## 2022-09-05 NOTE — Progress Notes (Signed)
Subjective:    Patient ID: Samantha Rosales, female    DOB: 05-01-1981, 41 y.o.   MRN: [id number]  HPI  41 year old female with a history of rheumatoid arthritis, alcohol use, admitted to the hospital with nausea and vomiting, abdominal pain.  Noted to have severe metabolic acidosis felt to be related to alcoholic ketoacidosis:  Admit date: 08/25/2022 Discharge date: 08/29/2022 Recommendations for Outpatient Follow-up:  Follow up with PCP in 1 weeks-call for appointment Continue with your potassium and magnesium supplement Please obtain BMP/CBC in one week   Discharge Dispo: Home Discharge Condition: Stable Code Status:   Code Status: Full Code Diet recommendation:  Diet Order                  Diet Carb Modified Fluid consistency: Thin; Room service appropriate? Yes  Diet effective now                         Brief/Interim Summary: 41 y.o.f  w/ history significant of abnormal Pap smear,GERD, history of hepatitis due to methotrexate or alcohol use, hyperlipidemia, HSV-2 infection, hypertension, hypothyroidism, rheumatoid arthritis, seizures as a child but no episodes since 89, tobacco use, multiple episodes of alcoholic pancreatitis who came into the ED w/ epigastric abdominal pain radiated to her back associated with central chest pain due to multiple episodes of nausea with emesis and also she had multiple episodes of diarrhea. She has not eaten much in the past 3 days PTA,has been drinking multiple types of alcoholic beverages.  ED course: Initial vital signs were temperature 98.5 F, pulse 86, respiration 17, BP 85/57 mmHg with an O2 sat of 99% on room air.  The patient received famotidine 20 mg IVP, 2000 mL of normal saline bolus, thiamine 100 mg IVP and 4 mg of ondansetron IVP. Labs: Urinalysis was hazy, small hemoglobinuria, ketonuria of 80 and proteinuria 100 mg/dL.  CBC showed her white at count 8.7, hemoglobin 13.6 g/dL platelets 96.  Troponin x2 normal.  Lipase was 47.   Sodium 148, potassium 3.2, chloride 97 and CO2 less than 7 mmol/L with an anion gap of 38.  Glucose 104, BUN 12 and creatinine 1.71 mg deciliter. AST 163, ALT 45 and alkaline phosphatase 167 units/L.  Total bilirubin was 1.9 mg/dL.  Magnesium was 1.5 and phosphorus 7.3 Cxr-no active disease.  CT renal stone possible terminal ileitis though this is incompletely evaluated with IV contrast.  There is marked hepatic asteatosis.  Diffuse pancreatic parenchymal calcifications compatible with sequela of chronic pancreatitis.  There was marked distention of the stomach.  Bilateral femoral head avascular necrosis. Patient was admitted to stepdown for nausea vomiting abdominal pain alcoholic ketoacidosis AKI and significant electrolyte imbalance  thrombocytopenia. She also had uncontrolled hyperglycemia placed on insulin drip> and overnight after gap was closed and BHOB improved she was placed on diet and placed in transitioned off insulin drip. Patient to follow-up with PCP regarding hyperglycemia with normal A1c. Or your lites at this time has stabilized and improved potassium 3.6 mag 1.6 phosphorus normalized.  She is supposed to be on daily potassium supplement I have reminded her that encouraged to take potassium rich food.  She feels well has been ambulating using a shower .  Platelet counts are trending up slowly and will need to be followed up by her primary care doctor with CBC check in a week    Discharge Diagnoses:  Principal Problem:   Alcoholic ketoacidosis Active Problems:   Hypomagnesemia  Hypophosphatemia   Alcohol abuse   Essential hypertension   Abnormal LFTs   Tobacco abuse   Anemia, chronic disease   Thrombocytopenia (HCC)   AKI (acute kidney injury) (HCC)   Abdominal pain   Nausea and vomiting   Abdomen pain w/ nausea vomiting diarrhea Possible terminal ileitis Chronic pancreatitis Alcoholic ketoacidosis possible startvation ketoacidosis: CT stone studies- as above:possible  terminal ileitis,marked hepatic asteatosis diffuse pancreatic parenchymal calcifications. Symptoms resolved no nausea vomiting diarrhea, tolerating diet> continue diet Ketoacidosis resolved.  Electrolytes stable, continue her Creon, continue diet, continue alcohol cessation.    Alcohol abuse Hepatic steatosis Transaminitis: No signs of withdrawal at this time doing well continue thiamine folate, LFTs have improved except for AST at 77, follow-up with PCP, continue alcohol cessation.   Last Labs         Recent Labs  Lab 08/26/22 0016 08/26/22 0447 08/27/22 0023 08/28/22 0501  AST 163* 136* 74* 77*  ALT 45* 40 30 30  ALKPHOS 167* 153* 115 110  BILITOT 1.9* 3.4* 1.2 0.8  PROT 8.9* 8.3* 6.7 6.8  ALBUMIN 4.2 4.0 3.2* 3.2*      Hyperglycemia: No known diabetes history, blood sugar stable after changing diet, A1c normal Last Labs            Recent Labs  Lab 08/26/22 1712 08/26/22 1733 08/28/22 1142 08/28/22 1736 08/28/22 2102 08/29/22 0743 08/29/22 1132  GLUCAP  --    < > 177* 173* 153* 117* 200*  HGBA1C 4.3*  --   --   --   --   --   --    < > = values in this interval not displayed.      Hypokalemia Hypomagnesemia Hypophosphatemia: Improved with replacement, she is supposed to take potassium supplement at home, also given magnesium supplement, encourage potassium rich diet.  Thrombocytopenia in the setting of chronic alcohol abuse, platelet low as low 35,000 in the past as well, now trending up in 45k follow-up with PCP with CBC check in a week Hypertension BP stable continue home meds on discharge follow-up with PCP Anemia of chronic disease hemoglobin is stable initially high value from hemoconcentration Severe malnutrition seen by dietitian augment nutritional status continue alcohol cessation. Nutrition Problem: Severe Malnutrition Etiology: chronic illness (alcohol abuse, pancreatitis) Signs/Symptoms: moderate fat depletion, moderate muscle depletion, percent weight  loss, energy intake < or equal to 75% for > or equal to 1 month Interventions: Ensure Enlive (each supplement provides 350kcal and 20 grams of protein), MVI   09/05/22 Patient is here today for follow-up.  She states that she is doing good.  She states that she is abstaining from all alcohol.  She is not attending [place] meetings.  She has not seeking any kind of outpatient counseling.  However she is consistently taking her Creon, Protonix, thiamine, folic acid, and potassium.  She denies any abdominal pain.  She denies any nausea or vomiting.  She denies any diarrhea.  She states that this time is different.  She has been maintaining her sobriety.  She is checking her blood sugars and they are typically 80-130 without taking any type of medication. Past Medical History:  Diagnosis Date   Abnormal Pap smear    Alcohol use    Allergic rhinitis    BV (bacterial vaginosis)    GERD (gastroesophageal reflux disease)    Hepatitis    LFT's >1000 (2019) due to methotrexate vs alcohol   High cholesterol    HSV-2 (herpes simplex virus 2) infection  Hypertension    Hypothyroidism 10/17/2019   Pancreatitis    12/19 (thought to be due to alcohol)   Rheumatoid arthritis ([place])    Seizures ([place])    dx as child with epilepsy but no seizures since 89 and on no meds   Vaginal Pap smear, abnormal    Past Surgical History:  Procedure Laterality Date   CERVICAL CONIZATION W/BX  12/20/2012   Procedure: CONIZATION CERVIX WITH BIOPSY;  Surgeon: Florian Buff, MD;  Location: [facility];  Service: Gynecology;  Laterality: N/A;   HOLMIUM LASER APPLICATION  0/71/2197   Procedure: HOLMIUM LASER APPLICATION;  Surgeon: Florian Buff, MD;  Location: [facility];  Service: Gynecology;  Laterality: N/A;   Current Outpatient Medications on File Prior to Visit  Medication Sig Dispense Refill   Adalimumab (HUMIRA) 40 MG/0.4ML PSKT Inject 40 mg into the skin every 30 (thirty) days.     BIOTIN PO Take 1 tablet by mouth daily.      blood glucose meter kit and supplies KIT Dispense based on patient and insurance preference. Use up to four times daily as directed. (Patient taking differently: Inject 1 each into the skin See admin instructions. Dispense based on patient and insurance preference. Use up to four times daily as directed.) 1 each 0   CALCIUM PO Take 1 tablet by mouth daily.     Cholecalciferol (VITAMIN D3 PO) Take 1 tablet by mouth daily.     cycloSPORINE (RESTASIS) 0.05 % ophthalmic emulsion Place 1 drop into both eyes 2 (two) times daily as needed (watery eyes/irritation).     escitalopram (LEXAPRO) 10 MG tablet Take 1 tablet (10 mg total) by mouth daily. (Patient taking differently: Take 10 mg by mouth daily as needed (as determined by self).) 90 tablet 3   feeding supplement (ENSURE ENLIVE / ENSURE PLUS) LIQD Take 237 mLs by mouth 2 (two) times daily with a meal for 7 days. 5883 mL 0   folic acid (FOLVITE) 1 MG tablet Take 1 tablet (1 mg total) by mouth daily. 30 tablet 0   lipase/protease/amylase (CREON) 36000 UNITS CPEP capsule Take 2 capsules (72,000 Units total) by mouth 3 (three) times daily with meals. May also take 1 capsule (36,000 Units total) as needed (with snacks). 240 capsule 11   losartan (COZAAR) 50 MG tablet Take 1 tablet (50 mg total) by mouth daily. 30 tablet 0   nebivolol (BYSTOLIC) 10 MG tablet Take 1 tablet (10 mg total) by mouth daily. 90 tablet 3   PANTOPRAZOLE SODIUM PO Take 1 tablet by mouth daily before breakfast.     potassium chloride SA (KLOR-CON M) 20 MEQ tablet Take 2 tablets (40 mEq total) by mouth daily. 60 tablet 11   thiamine (VITAMIN B1) 100 MG tablet Take 1 tablet (100 mg total) by mouth 2 (two) times daily. 60 tablet 0   No current facility-administered medications on file prior to visit.   Allergies  Allergen Reactions   Penicillins Hives    Has patient had a PCN reaction causing immediate rash, facial/tongue/throat swelling, SOB or lightheadedness with hypotension:  Unknown Has patient had a PCN reaction causing severe rash involving mucus membranes or skin necrosis: Unknown Has patient had a PCN reaction that required hospitalization: Unknown Has patient had a PCN reaction occurring within the last 10 years: No If all of the above answers are "NO", then may proceed with Cephalosporin use.    Social History   Socioeconomic History   Marital status: Married  Spouse name: Not on file   Number of children: 2   Years of education: Not on file   Highest education level: Not on file  Occupational History   Occupation: [employer]    Employer: [employer]   Tobacco Use   Smoking status: Every Day    Packs/day: 0.50    Years: 15.00    Total pack years: 7.50    Types: Cigarettes   Smokeless tobacco: Never  Vaping Use   Vaping Use: Never used  Substance and Sexual Activity   Alcohol use: Yes    Alcohol/week: 28.0 standard drinks of alcohol    Types: 28 Standard drinks or equivalent per week    Comment: 4-6 drinks per day depending what theyre doing.   Drug use: No   Sexual activity: Yes    Birth control/protection: Injection    Comment: Depo Provera every 3 months  Other Topics Concern   Not on file  Social History Narrative   Not on file   Social Determinants of Health   Financial Resource Strain: Not on file  Food Insecurity: No Food Insecurity (08/27/2022)   Hunger Vital Sign    Worried About Running Out of Food in the Last Year: Never true    Ran Out of Food in the Last Year: Never true  Transportation Needs: No Transportation Needs (08/27/2022)   PRAPARE - Hydrologist (Medical): No    Lack of Transportation (Non-Medical): No  Physical Activity: Not on file  Stress: Not on file  Social Connections: Not on file  Intimate Partner Violence: Not At Risk (08/27/2022)   Humiliation, Afraid, Rape, and Kick questionnaire    Fear of Current or Ex-Partner: No    Emotionally Abused: No     Physically Abused: No    Sexually Abused: No   Family History  Problem Relation Age of Onset   Hypertension Mother    Hypertension Father    Pancreatic cancer Paternal Uncle    Colon cancer Neg Hx    Esophageal cancer Neg Hx    Inflammatory bowel disease Neg Hx    Liver disease Neg Hx    Rectal cancer Neg Hx    Stomach cancer Neg Hx       Review of Systems     Objective:   Physical Exam Constitutional:      General: She is not in acute distress.    Appearance: Normal appearance. She is cachectic. She is not ill-appearing, toxic-appearing or diaphoretic.  Cardiovascular:     Rate and Rhythm: Regular rhythm. Tachycardia present.     Heart sounds: Normal heart sounds. No murmur heard.    No gallop.  Pulmonary:     Effort: Pulmonary effort is normal. No respiratory distress.     Breath sounds: Normal breath sounds. No stridor. No wheezing, rhonchi or rales.  Abdominal:     General: Bowel sounds are normal. There is no distension.     Palpations: Abdomen is soft.     Tenderness: There is no abdominal tenderness. There is no guarding.  Musculoskeletal:     Cervical back: Neck supple. No tenderness.  Skin:    Findings: No rash.  Neurological:     General: No focal deficit present.     Mental Status: She is alert and oriented to person, place, and time.     Cranial Nerves: No cranial nerve deficit.     Sensory: No sensory deficit.     Motor: No  weakness.     Coordination: Coordination normal.     Gait: Gait normal.  Psychiatric:        Attention and Perception: Attention normal.        Mood and Affect: Mood normal.        Speech: Speech normal.        Behavior: Behavior is cooperative.           Assessment & Plan:  Alcoholic ketoacidosis - Plan: CBC with Differential/Platelet, COMPLETE METABOLIC PANEL WITH GFR, Magnesium, Phosphorus  Pancreatic insufficiency - Plan: CBC with Differential/Platelet, COMPLETE METABOLIC PANEL WITH GFR, Magnesium,  Phosphorus  Type 2 diabetes mellitus with hyperglycemia, unspecified whether long term insulin use ([place]) - Plan: CBC with Differential/Platelet, COMPLETE METABOLIC PANEL WITH GFR, Magnesium, Phosphorus I explained to the patient that her alcoholism has damaged her pancreas causing pancreatic insufficiency and type 2 diabetes mellitus.  I explained to her that Creon allows her to metabolize her foods and that she can gain and absorb valuable nutrition.  I explained to her that she needs to monitor her blood sugars.  As long as her blood sugars are staying between 80 and 130 fasting between 80 and 160, 2 hours postprandial, she does not require medication for her sugars.  Therefore at the present time we are going to monitor her sugars.  If sugar levels begin to climb I would recommend Actos to avoid the lactic acidosis that metformin can cause and to avoid hypoglycemia due to her poor intake with glipizide can cause.  I have strongly recommended Alcoholics Anonymous or some kind of outpatient counseling to maintain sobriety but she declines.  I will check a CBC a CMP and magnesium level and phosphorus.

## 2022-09-06 LAB — COMPLETE METABOLIC PANEL WITH GFR
AG Ratio: 1.2 (calc) (ref 1.0–2.5)
ALT: 18 U/L (ref 6–29)
AST: 26 U/L (ref 10–30)
Albumin: 3.7 g/dL (ref 3.6–5.1)
Alkaline phosphatase (APISO): 101 U/L (ref 31–125)
BUN: 10 mg/dL (ref 7–25)
CO2: 24 mmol/L (ref 20–32)
Calcium: 9.5 mg/dL (ref 8.6–10.2)
Chloride: 107 mmol/L (ref 98–110)
Creat: 0.55 mg/dL (ref 0.50–0.99)
Globulin: 3.1 g/dL (calc) (ref 1.9–3.7)
Glucose, Bld: 94 mg/dL (ref 65–99)
Potassium: 5.1 mmol/L (ref 3.5–5.3)
Sodium: 138 mmol/L (ref 135–146)
Total Bilirubin: 0.3 mg/dL (ref 0.2–1.2)
Total Protein: 6.8 g/dL (ref 6.1–8.1)
eGFR: 117 mL/min/{1.73_m2} (ref >=60)

## 2022-09-06 LAB — CBC WITH DIFFERENTIAL/PLATELET
Absolute Monocytes: 490 cells/uL (ref 200–950)
Basophils Absolute: 50 cells/uL (ref 0–200)
Basophils Relative: 1 %
Eosinophils Absolute: 30 cells/uL (ref 15–500)
Eosinophils Relative: 0.6 %
HCT: 27.9 % — ABNORMAL LOW (ref 35.0–45.0)
Hemoglobin: 9.4 g/dL — ABNORMAL LOW (ref 11.7–15.5)
Lymphs Abs: 1960 cells/uL (ref 850–3900)
MCH: 32.9 pg (ref 27.0–33.0)
MCHC: 33.7 g/dL (ref 32.0–36.0)
MCV: 97.6 fL (ref 80.0–100.0)
MPV: 11.1 fL (ref 7.5–12.5)
Monocytes Relative: 9.8 %
Neutro Abs: 2470 cells/uL (ref 1500–7800)
Neutrophils Relative %: 49.4 %
Platelets: 363 10*3/uL (ref 140–400)
RBC: 2.86 10*6/uL — ABNORMAL LOW (ref 3.80–5.10)
RDW: 12.3 % (ref 11.0–15.0)
Total Lymphocyte: 39.2 %
WBC: 5 10*3/uL (ref 3.8–10.8)

## 2022-09-06 LAB — MAGNESIUM: Magnesium: 1.8 mg/dL (ref 1.5–2.5)

## 2022-09-06 LAB — PHOSPHORUS: Phosphorus: 4.1 mg/dL (ref 2.5–4.5)

## 2022-09-15 ENCOUNTER — Telehealth: Payer: Self-pay

## 2022-09-15 ENCOUNTER — Emergency Department (HOSPITAL_COMMUNITY): Payer: BC Managed Care – PPO

## 2022-09-15 ENCOUNTER — Emergency Department (HOSPITAL_COMMUNITY)
Admission: EM | Admit: 2022-09-15 | Discharge: 2022-09-15 | Payer: BC Managed Care – PPO | Attending: Emergency Medicine | Admitting: Emergency Medicine

## 2022-09-15 ENCOUNTER — Encounter (HOSPITAL_COMMUNITY): Payer: Self-pay

## 2022-09-15 ENCOUNTER — Other Ambulatory Visit: Payer: Self-pay

## 2022-09-15 DIAGNOSIS — S51012A Laceration without foreign body of left elbow, initial encounter: Secondary | ICD-10-CM | POA: Diagnosis not present

## 2022-09-15 DIAGNOSIS — S59902A Unspecified injury of left elbow, initial encounter: Secondary | ICD-10-CM | POA: Diagnosis present

## 2022-09-15 DIAGNOSIS — S90511A Abrasion, right ankle, initial encounter: Secondary | ICD-10-CM | POA: Insufficient documentation

## 2022-09-15 DIAGNOSIS — Y9302 Activity, running: Secondary | ICD-10-CM | POA: Diagnosis not present

## 2022-09-15 DIAGNOSIS — Z23 Encounter for immunization: Secondary | ICD-10-CM | POA: Insufficient documentation

## 2022-09-15 DIAGNOSIS — F10920 Alcohol use, unspecified with intoxication, uncomplicated: Secondary | ICD-10-CM | POA: Diagnosis not present

## 2022-09-15 DIAGNOSIS — W01198A Fall on same level from slipping, tripping and stumbling with subsequent striking against other object, initial encounter: Secondary | ICD-10-CM | POA: Insufficient documentation

## 2022-09-15 DIAGNOSIS — S90512A Abrasion, left ankle, initial encounter: Secondary | ICD-10-CM | POA: Diagnosis not present

## 2022-09-15 MED ORDER — TETANUS-DIPHTH-ACELL PERTUSSIS 5-2.5-18.5 LF-MCG/0.5 IM SUSY
0.5000 mL | PREFILLED_SYRINGE | Freq: Once | INTRAMUSCULAR | Status: AC
  Administered 2022-09-15: 0.5 mL via INTRAMUSCULAR
  Filled 2022-09-15: qty 0.5

## 2022-09-15 MED ORDER — ACETAMINOPHEN 500 MG PO TABS
1000.0000 mg | ORAL_TABLET | Freq: Once | ORAL | Status: AC
  Administered 2022-09-15: 1000 mg via ORAL
  Filled 2022-09-15: qty 2

## 2022-09-15 MED ORDER — LIDOCAINE-EPINEPHRINE (PF) 2 %-1:200000 IJ SOLN
10.0000 mL | Freq: Once | INTRAMUSCULAR | Status: AC
  Administered 2022-09-15: 10 mL via INTRADERMAL
  Filled 2022-09-15: qty 20

## 2022-09-15 NOTE — ED Triage Notes (Signed)
Pt BIB police and EMS for ETOH and driving. Pt has a laceration to her left elbow from falling. Pt is going to jail.

## 2022-09-15 NOTE — ED Provider Notes (Signed)
[facility] [place] DEPT Provider Note   CSN: [id number] Arrival date & time: 09/15/22  2141     History  Chief Complaint  Patient presents with   Alcohol Intoxication   Laceration    Samantha Rosales is a 41 y.o. female.  The history is provided by the patient, the police and medical records. No language interpreter was used.  Alcohol Intoxication  Laceration    41 year old female brought in by police and EMS for evaluation of left elbow injury.  Patient admits that she is intoxicated.  She states she was running fell and struck her left elbow against the ground.  This happened earlier today.  She denies hitting her head or loss of consciousness.  She denies any other injury.  She is unsure of her last status.  She report her pain is minimal.  She denies any numbness.  She was noted to be driving while intoxicated.  Please plan to bring patient to jail after evaluation.  Home Medications Prior to Admission medications   Medication Sig Start Date End Date Taking? Authorizing Provider  Adalimumab (HUMIRA) 40 MG/0.4ML PSKT Inject 40 mg into the skin every 30 (thirty) days.    [provider]  BIOTIN PO Take 1 tablet by mouth daily.    [provider]  blood glucose meter kit and supplies KIT Dispense based on patient and insurance preference. Use up to four times daily as directed. Patient taking differently: Inject 1 each into the skin See admin instructions. Dispense based on patient and insurance preference. Use up to four times daily as directed. 06/23/22   [name], MD  CALCIUM PO Take 1 tablet by mouth daily.    [provider]  Cholecalciferol (VITAMIN D3 PO) Take 1 tablet by mouth daily.    [provider]  cycloSPORINE (RESTASIS) 0.05 % ophthalmic emulsion Place 1 drop into both eyes 2 (two) times daily as needed (watery eyes/irritation). 12/31/20   [provider]  escitalopram (LEXAPRO) 10 MG tablet  Take 1 tablet (10 mg total) by mouth daily. Patient taking differently: Take 10 mg by mouth daily as needed (as determined by self). 02/17/22   [name], MD  folic acid (FOLVITE) 1 MG tablet Take 1 tablet (1 mg total) by mouth daily. 08/29/22 09/28/22  Antonieta Pert, MD  lipase/protease/amylase (CREON) 36000 UNITS CPEP capsule Take 2 capsules (72,000 Units total) by mouth 3 (three) times daily with meals. May also take 1 capsule (36,000 Units total) as needed (with snacks). 07/06/22   [name], MD  losartan (COZAAR) 50 MG tablet Take 1 tablet (50 mg total) by mouth daily. 06/24/22   [name], MD  nebivolol (BYSTOLIC) 10 MG tablet Take 1 tablet (10 mg total) by mouth daily. 04/05/21   [name], MD  PANTOPRAZOLE SODIUM PO Take 1 tablet by mouth daily before breakfast.    [provider]  potassium chloride SA (KLOR-CON M) 20 MEQ tablet Take 2 tablets (40 mEq total) by mouth daily. 04/21/22 08/27/22  [name], MD  thiamine (VITAMIN B1) 100 MG tablet Take 1 tablet (100 mg total) by mouth 2 (two) times daily. 08/29/22 09/28/22  Antonieta Pert, MD      Allergies    Penicillins    Review of Systems   Review of Systems  All other systems reviewed and are negative.   Physical Exam Updated Vital Signs BP (!) 125/94   Pulse 79   Temp 98.4 F (36.9  C) (Oral)   Resp 15   SpO2 99%  Physical Exam Vitals and nursing note reviewed.  Constitutional:      General: She is not in acute distress.    Appearance: She is well-developed.     Comments: Patient is tearful  HENT:     Head: Atraumatic.  Eyes:     Conjunctiva/sclera: Conjunctivae normal.  Pulmonary:     Effort: Pulmonary effort is normal.  Musculoskeletal:        General: Signs of injury (Left elbow: 5 cm laceration noted to the posterior elbow not actively bleeding.  No foreign body noted.  Able to flex and extend elbow without deformity.) present.     Cervical back: Neck supple.     Comments: No  shoulder or wrist injury.  Skin:    Findings: No rash.  Neurological:     Mental Status: She is alert.  Psychiatric:        Mood and Affect: Mood normal.     ED Results / Procedures / Treatments   Labs (all labs ordered are listed, but only abnormal results are displayed) Labs Reviewed - No data to display  EKG None  Radiology DG Elbow 2 Views Left  Result Date: 09/15/2022 CLINICAL DATA:  Fall. EXAM: LEFT ELBOW - 2 VIEW COMPARISON:  Left humeral radiographs dated February 27, 2022 FINDINGS: There is no evidence of acute fracture, dislocation, or joint effusion. There is no evidence of arthropathy or other focal bone abnormality. Partially imaged chronic fracture of the left humeral diaphysis. Soft tissues are unremarkable. IMPRESSION: 1. No acute displaced fracture or dislocation of the left elbow. 2. Partially imaged chronic fracture of the left humeral diaphysis. Electronically Signed   By: [place] [employer] D.O.   On: 09/15/2022 23:20    Procedures .Marland KitchenLaceration Repair  Date/Time: 09/15/2022 11:28 PM  Performed by: Domenic Moras, PA-C Authorized by: Domenic Moras, PA-C   Consent:    Consent obtained:  Verbal   Consent given by:  Patient   Risks discussed:  Infection, need for additional repair, pain, poor cosmetic result and poor wound healing   Alternatives discussed:  No treatment and delayed treatment Universal protocol:    Procedure explained and questions answered to patient or proxy's satisfaction: yes     Relevant documents present and verified: yes     Test results available: yes     Imaging studies available: yes     Required blood products, implants, devices, and special equipment available: yes     Site/side marked: yes     Immediately prior to procedure, a time out was called: yes     Patient identity confirmed:  Verbally with patient Anesthesia:    Anesthesia method:  Local infiltration   Local anesthetic:  Lidocaine 2% WITH epi Laceration details:    Location:   Shoulder/arm   Shoulder/arm location:  L elbow   Length (cm):  5   Depth (mm):  4 Pre-procedure details:    Preparation:  Patient was prepped and draped in usual sterile fashion and imaging obtained to evaluate for foreign bodies Exploration:    Limited defect created (wound extended): no     Hemostasis achieved with:  Epinephrine and direct pressure   Imaging obtained: x-ray     Imaging outcome: foreign body not noted     Wound exploration: wound explored through full range of motion and entire depth of wound visualized     Wound extent: no muscle damage noted, no underlying fracture noted and  no vascular damage noted     Contaminated: no   Treatment:    Area cleansed with:  Povidone-iodine   Amount of cleaning:  Standard   Irrigation solution:  Sterile saline   Irrigation method:  Pressure wash   Visualized foreign bodies/material removed: no     Debridement:  None   Undermining:  None Skin repair:    Repair method:  Sutures   Suture size:  5-0   Suture material:  Prolene   Suture technique:  Simple interrupted   Number of sutures:  7 Approximation:    Approximation:  Close Repair type:    Repair type:  Intermediate Post-procedure details:    Dressing:  Non-adherent dressing   Procedure completion:  Tolerated well, no immediate complications     Medications Ordered in ED Medications  Tdap (BOOSTRIX) injection 0.5 mL (0.5 mLs Intramuscular Given 09/15/22 2245)  acetaminophen (TYLENOL) tablet 1,000 mg (1,000 mg Oral Given 09/15/22 2245)  lidocaine-EPINEPHrine (XYLOCAINE W/EPI) 2 %-1:200000 (PF) injection 10 mL (10 mLs Intradermal Given 09/15/22 2245)    ED Course/ Medical Decision Making/ A&P                           Medical Decision Making  BP (!) 125/94   Pulse 79   Temp 98.4 F (36.9 C) (Oral)   Resp 15   SpO2 99%   33:40 PM 41 year old female brought in by police and EMS for evaluation of left elbow injury.  Patient admits that she is intoxicated.  She  states she was running fell and struck her left elbow against the ground.  This happened earlier today.  She denies hitting her head or loss of consciousness.  She denies any other injury.  She is unsure of her last status.  She report her pain is minimal.  She denies any numbness.  She was noted to be driving while intoxicated.  Please plan to bring patient to jail after evaluation.  On exam patient is sitting, tearful, but in no acute distress.  Exam remarkable for a laceration noted to her left posterior elbow approximately 5 cm in diameter without any foreign body noted.  She is able to flex and extend elbow without any obvious deformity.  She has some small abrasion to bilateral ankle but no other notable injuries noted.  No scalp tenderness no midline spine tenderness no chest wall abdominal pain.  Plan to give patient Tylenol for pain, x-ray of left elbow, and will perform laceration repair.  11:30 PM X-ray of the left elbow was obtained independently viewed interpreted by me and I agree with radiologist interpretation.  Fortunately no fracture or dislocation and no foreign body noted.  Laceration was thorough cleans and repaired by me using 5-0 Prolene.  I gave patient strict wound care instruction, recommend suture removal in 5 to 7 days and at this time patient is stable to be discharged back to the possessions of the police.  I have considered patient social determinant of health which includes tobacco use and recommend tobacco cessation.  I have reviewed patient's prior EMR and considered my plan of care.  I have given patient Tylenol for pain which improvement of symptoms.        Final Clinical Impression(s) / ED Diagnoses Final diagnoses:  Elbow laceration, left, initial encounter    Rx / DC Orders ED Discharge Orders     None         Domenic Moras,  PA-C 09/15/22 [id number]    Isla Pence, MD 09/16/22 [phone]

## 2022-09-15 NOTE — Telephone Encounter (Signed)
Pt called back for lab results. Advised patient that she needs to start Ferrous Sulfate 325 mg due to low hemoglobin. Pt states she is currently taking the 325 mg daily. Does she need to increase to BID? Thank you.

## 2022-09-15 NOTE — Discharge Instructions (Addendum)
You have suffered laceration to your left elbow that was repaired using nonabsorbable sutures.  Please have your sutures removed in 5 to 7 days.  Take ibuprofen as needed for pain.  Clean his wound daily and apply Neosporin over the wound to decrease risk of infection.  Return if you have any concern.  X-ray of your left elbow did not show any broken bone.

## 2022-09-19 ENCOUNTER — Telehealth: Payer: Self-pay

## 2022-09-19 ENCOUNTER — Encounter (HOSPITAL_COMMUNITY): Payer: Self-pay

## 2022-09-19 ENCOUNTER — Emergency Department (HOSPITAL_COMMUNITY)
Admission: EM | Admit: 2022-09-19 | Discharge: 2022-09-20 | Disposition: A | Discharge status: Home / Self Care | Payer: BC Managed Care – PPO | Attending: Emergency Medicine | Admitting: Emergency Medicine

## 2022-09-19 ENCOUNTER — Telehealth: Payer: Self-pay | Admitting: Family Medicine

## 2022-09-19 DIAGNOSIS — R440 Auditory hallucinations: Secondary | ICD-10-CM | POA: Diagnosis present

## 2022-09-19 DIAGNOSIS — F10288 Alcohol dependence with other alcohol-induced disorder: Secondary | ICD-10-CM | POA: Diagnosis not present

## 2022-09-19 DIAGNOSIS — F1020 Alcohol dependence, uncomplicated: Secondary | ICD-10-CM | POA: Diagnosis present

## 2022-09-19 DIAGNOSIS — Z1152 Encounter for screening for COVID-19: Secondary | ICD-10-CM | POA: Diagnosis not present

## 2022-09-19 DIAGNOSIS — F10939 Alcohol use, unspecified with withdrawal, unspecified: Secondary | ICD-10-CM | POA: Diagnosis present

## 2022-09-19 DIAGNOSIS — R443 Hallucinations, unspecified: Secondary | ICD-10-CM

## 2022-09-19 LAB — CBC WITH DIFFERENTIAL/PLATELET
Abs Immature Granulocytes: 0.04 10*3/uL (ref 0.00–0.07)
Basophils Absolute: 0 10*3/uL (ref 0.0–0.1)
Basophils Relative: 0 %
Eosinophils Absolute: 0.2 10*3/uL (ref 0.0–0.5)
Eosinophils Relative: 3 %
HCT: 30.8 % — ABNORMAL LOW (ref 36.0–46.0)
Hemoglobin: 10.1 g/dL — ABNORMAL LOW (ref 12.0–15.0)
Immature Granulocytes: 1 %
Lymphocytes Relative: 43 %
Lymphs Abs: 2.3 10*3/uL (ref 0.7–4.0)
MCH: 32.4 pg (ref 26.0–34.0)
MCHC: 32.8 g/dL (ref 30.0–36.0)
MCV: 98.7 fL (ref 80.0–100.0)
Monocytes Absolute: 0.3 10*3/uL (ref 0.1–1.0)
Monocytes Relative: 5 %
Neutro Abs: 2.6 10*3/uL (ref 1.7–7.7)
Neutrophils Relative %: 48 %
Platelets: 128 10*3/uL — ABNORMAL LOW (ref 150–400)
RBC: 3.12 MIL/uL — ABNORMAL LOW (ref 3.87–5.11)
RDW: 13.4 % (ref 11.5–15.5)
WBC: 5.4 10*3/uL (ref 4.0–10.5)
nRBC: 0 % (ref 0.0–0.2)

## 2022-09-19 LAB — COMPREHENSIVE METABOLIC PANEL
ALT: 27 U/L (ref 0–44)
AST: 38 U/L (ref 15–41)
Albumin: 4.1 g/dL (ref 3.5–5.0)
Alkaline Phosphatase: 102 U/L (ref 38–126)
Anion gap: 6 (ref 5–15)
BUN: 18 mg/dL (ref 6–20)
CO2: 25 mmol/L (ref 22–32)
Calcium: 9.5 mg/dL (ref 8.9–10.3)
Chloride: 105 mmol/L (ref 98–111)
Creatinine, Ser: 0.77 mg/dL (ref 0.44–1.00)
GFR, Estimated: >60 mL/min (ref >60)
Glucose, Bld: 96 mg/dL (ref 70–99)
Potassium: 4 mmol/L (ref 3.5–5.1)
Sodium: 136 mmol/L (ref 135–145)
Total Bilirubin: 0.6 mg/dL (ref 0.3–1.2)
Total Protein: 8.4 g/dL — ABNORMAL HIGH (ref 6.5–8.1)

## 2022-09-19 LAB — ETHANOL: Alcohol, Ethyl (B): <10 mg/dL (ref <10)

## 2022-09-19 LAB — RESP PANEL BY RT-PCR (FLU A&B, COVID) ARPGX2
Influenza A by PCR: NEGATIVE
Influenza B by PCR: NEGATIVE
SARS Coronavirus 2 by RT PCR: NEGATIVE

## 2022-09-19 LAB — RAPID URINE DRUG SCREEN, HOSP PERFORMED
Amphetamines: NOT DETECTED
Barbiturates: NOT DETECTED
Benzodiazepines: NOT DETECTED
Cocaine: NOT DETECTED
Opiates: NOT DETECTED
Tetrahydrocannabinol: NOT DETECTED

## 2022-09-19 LAB — HCG, SERUM, QUALITATIVE: Preg, Serum: NEGATIVE

## 2022-09-19 LAB — CBG MONITORING, ED: Glucose-Capillary: 96 mg/dL (ref 70–99)

## 2022-09-19 LAB — SALICYLATE LEVEL: Salicylate Lvl: <7 mg/dL — ABNORMAL LOW (ref 7.0–30.0)

## 2022-09-19 LAB — ACETAMINOPHEN LEVEL: Acetaminophen (Tylenol), Serum: <10 ug/mL — ABNORMAL LOW (ref 10–30)

## 2022-09-19 MED ORDER — IBUPROFEN 200 MG PO TABS: 600.0000 mg | ORAL_TABLET | Freq: Three times a day (TID) | ORAL | Status: DC | PRN

## 2022-09-19 MED ORDER — PANTOPRAZOLE SODIUM 40 MG PO TBEC
40.0000 mg | DELAYED_RELEASE_TABLET | Freq: Every day | ORAL | Status: DC
  Administered 2022-09-20: 40 mg via ORAL
  Filled 2022-09-19: qty 1

## 2022-09-19 MED ORDER — NICOTINE 21 MG/24HR TD PT24
21.0000 mg | MEDICATED_PATCH | Freq: Every day | TRANSDERMAL | Status: DC
  Filled 2022-09-19: qty 1

## 2022-09-19 MED ORDER — FOLIC ACID 1 MG PO TABS
1.0000 mg | ORAL_TABLET | Freq: Every day | ORAL | Status: DC
  Administered 2022-09-20: 1 mg via ORAL
  Filled 2022-09-19: qty 1

## 2022-09-19 MED ORDER — HALOPERIDOL LACTATE 5 MG/ML IJ SOLN
2.0000 mg | Freq: Once | INTRAMUSCULAR | Status: AC
  Administered 2022-09-20: 2 mg via INTRAMUSCULAR
  Filled 2022-09-19: qty 1

## 2022-09-19 MED ORDER — THIAMINE MONONITRATE 100 MG PO TABS
100.0000 mg | ORAL_TABLET | Freq: Every day | ORAL | Status: DC
  Administered 2022-09-20: 100 mg via ORAL
  Filled 2022-09-19: qty 1

## 2022-09-19 MED ORDER — ESCITALOPRAM OXALATE 10 MG PO TABS
10.0000 mg | ORAL_TABLET | Freq: Every day | ORAL | Status: DC
  Administered 2022-09-20: 10 mg via ORAL
  Filled 2022-09-19: qty 1

## 2022-09-19 MED ORDER — ALUM & MAG HYDROXIDE-SIMETH 200-200-20 MG/5ML PO SUSP: 30.0000 mL | Freq: Four times a day (QID) | ORAL | Status: DC | PRN

## 2022-09-19 MED ORDER — PANCRELIPASE (LIP-PROT-AMYL) 36000-114000 UNITS PO CPEP
36000.0000 [IU] | ORAL_CAPSULE | Freq: Three times a day (TID) | ORAL | Status: DC
  Administered 2022-09-20: 36000 [IU] via ORAL
  Filled 2022-09-19 (×3): qty 1

## 2022-09-19 NOTE — ED Notes (Signed)
Pt attempted to wander halls. Pt reports going to see her brother. Pt redirected back to bed. Pt family at bedside

## 2022-09-19 NOTE — Telephone Encounter (Signed)
Pt states she is currently taking Creon and is having issues with hallucinations. Pt states she was seeing people in her home with guns and she called the police. When I talked with the patient the [employer] officers were still there.  Pt states she is also taking Floracon. Could either one of these medications cause hallucinations? Thank you.

## 2022-09-19 NOTE — ED Provider Triage Note (Signed)
Emergency Medicine Provider Triage Evaluation Note  Samantha Rosales , a 41 y.o. female  was evaluated in triage.  Pt complains of auditory and visual hallucinations. Last drink was Friday. Drinks 3-4 white claws / day and sometimes 1-2 shots of liquor.  No rec drug use.   Creon, K started recently   Per husband she called the police today.  States this happened over the summer as well.   Review of Systems  Positive: Hallucinations Negative: Fever, N, V  Physical Exam  BP (!) 134/101 (BP Location: Right Arm)   Pulse 88   Temp 98.6 F (37 C) (Oral)   Resp 15   Ht 5\' 8"  (1.727 m)   Wt 49.9 kg   SpO2 91%   BMI 16.73 kg/m  Gen:   Awake, no distress   Resp:  Normal effort  MSK:   Moves extremities without difficulty  Other:    Medical Decision Making  Medically screening exam initiated at 7:32 PM.  Appropriate orders placed.  Samantha Rosales was informed that the remainder of the evaluation will be completed by another provider, this initial triage assessment does not replace that evaluation, and the importance of remaining in the ED until their evaluation is complete.  Labs for psych workup. Does not appear to be acutely withdrawing.    Samantha Rosales, [place] 09/19/22 1935

## 2022-09-19 NOTE — Telephone Encounter (Signed)
Pt called in to discuss the new med that she is taking (CREON). Pt feels as this med may be causing her to have hallucinations. Pt stated that she has never felt this way before until taking these meds. Pt would like to speak to the nurse asap please. Please advise.  Cb#: [phone]

## 2022-09-19 NOTE — ED Triage Notes (Signed)
Pt has been having AVH since last night denies SI/HI, reports that she recently started two new medications.

## 2022-09-20 DIAGNOSIS — F1020 Alcohol dependence, uncomplicated: Secondary | ICD-10-CM

## 2022-09-20 NOTE — Consult Note (Signed)
[facility] ED ASSESSMENT   Reason for Consult:  Visual Hallucinations Referring Physician:  April Palumbo, MD Patient Identification: Samantha Rosales MRN:  [id number] ED Chief Complaint: Alcohol use disorder, severe, dependence ([place])  Diagnosis:  Principal Problem:   Alcohol use disorder, severe, dependence ([place]) Active Problems:   Alcohol withdrawal [facility])   ED Assessment Time Calculation: Start Time: 1045 Stop Time: 1110 Total Time in Minutes (Assessment Completion): 25   Subjective:   Samantha Rosales is a 41 y.o. female patient admitted with a history of depression who presented to [facility] on 09/19/22 due to visual hallucinations.  HPI:   Patient reports that yesterday she was having visual hallucinations of people in her yard.  She states that she had a similar experience in the past when she stopped drinking alcohol for several days.  States at that time had hallucinations of spiders.  She reports that she started drinking alcohol when she was around 41 years old.  States that on most days she drinks 2-3 white claws.  States that some days she will take a shot or to a liquor.  States that some days she may only have 1 drink.  States that she recently got a DWI and needs to have a DWI assessment.  Discussed resources available in the [place] area for DWI assessments.  Patient denies use of nicotine, marijuana, and other substances.  Patient denies suicidal ideations.  She denies a history of suicide attempts.  She reports a history of depression and states that she takes Lexapro 10 mg daily.  States that this is prescribed by her PCP.  States that she is not currently working due to her RA and having some vision issues.  States that she was previously working for an [employer].  States that she lives with her husband and and two children who are 36 year old and 72 year old.  States that she has frequent contact with her mother.  On evaluation patient is alert and oriented x 4, pleasant, and  cooperative. Speech is clear and coherent. Mood is euthymic and affect is congruent with mood. Thought process is coherent and thought content is logical. Denies auditory and visual hallucinations. No indication that patient is responding to internal stimuli. No evidence of delusional thought content. Denies suicidal ideations. Denies homicidal ideations. Denies use of substances other than alcohol. UDS negative. BAL <10.  Met with patient again at 1430.  At this time the patient's mother Cassadie Pankonin) is at the bedside.  Patient continues to deny suicidal ideations, homicidal ideations, and auditory and visual hallucinations.  There has been no overt evidence of psychosis today.  Patient's mother denies any concerns for the patient's safety.  Patient agrees to return to the emergency department or contact 911 if hallucinations return.  Past Psychiatric History: Alcohol use disorder  Risk to Self or Others: Is the patient at risk to self? No Has the patient been a risk to self in the past 6 months? No Has the patient been a risk to self within the distant past? No Is the patient a risk to others? No Has the patient been a risk to others in the past 6 months? No Has the patient been a risk to others within the distant past? No  [place] Scale:  [place] ED from 09/19/2022 in [place] DEPT ED to Hosp-Admission (Discharged) from 08/25/2022 in [place] ED to Hosp-Admission (Discharged) from 06/20/2022 in [place]  C-SSRS RISK CATEGORY No Risk No Risk No Risk       AIMS:  , , ,  ,   ASAM:    Substance Abuse:     Past Medical History:  Past Medical History:  Diagnosis Date   Abnormal Pap smear    Alcohol use    Allergic rhinitis    BV (bacterial vaginosis)    GERD (gastroesophageal reflux disease)    Hepatitis    LFT's >1000 (2019) due to methotrexate vs alcohol    High cholesterol    HSV-2 (herpes simplex virus 2) infection    Hypertension    Hypothyroidism 10/17/2019   Pancreatitis    12/19 (thought to be due to alcohol)   Rheumatoid arthritis (HCC)    Seizures (HCC)    dx as child with epilepsy but no seizures since 89 and on no meds   Vaginal Pap smear, abnormal     Past Surgical History:  Procedure Laterality Date   CERVICAL CONIZATION W/BX  12/20/2012   Procedure: CONIZATION CERVIX WITH BIOPSY;  Surgeon: Florian Buff, MD;  Location: [facility];  Service: Gynecology;  Laterality: N/A;   HOLMIUM LASER APPLICATION  8/33/8250   Procedure: HOLMIUM LASER APPLICATION;  Surgeon: Florian Buff, MD;  Location: [facility];  Service: Gynecology;  Laterality: N/A;   Family History:  Family History  Problem Relation Age of Onset   Hypertension Mother    Hypertension Father    Pancreatic cancer Paternal Uncle    Colon cancer Neg Hx    Esophageal cancer Neg Hx    Inflammatory bowel disease Neg Hx    Liver disease Neg Hx    Rectal cancer Neg Hx    Stomach cancer Neg Hx    Family Psychiatric  History: Depression, Anxiety Social History:  Social History   Substance and Sexual Activity  Alcohol Use Yes   Alcohol/week: 28.0 standard drinks of alcohol   Types: 28 Standard drinks or equivalent per week   Comment: 4-6 drinks per day depending what theyre doing.     Social History   Substance and Sexual Activity  Drug Use No    Social History   Socioeconomic History   Marital status: Married    Spouse name: Not on file   Number of children: 2   Years of education: Not on file   Highest education level: Not on file  Occupational History   Occupation: [employer]    Employer: [employer]   Tobacco Use   Smoking status: Every Day    Packs/day: 0.50    Years: 15.00    Total pack years: 7.50    Types: Cigarettes   Smokeless tobacco: Never  Vaping Use   Vaping Use: Never used  Substance and Sexual Activity   Alcohol  use: Yes    Alcohol/week: 28.0 standard drinks of alcohol    Types: 28 Standard drinks or equivalent per week    Comment: 4-6 drinks per day depending what theyre doing.   Drug use: No   Sexual activity: Yes    Birth control/protection: Injection    Comment: Depo Provera every 3 months  Other Topics Concern   Not on file  Social History Narrative   Not on file   Social Determinants of Health   Financial Resource Strain: Not on file  Food Insecurity: No Food Insecurity (08/27/2022)   Hunger Vital Sign    Worried About Running Out of Food in the Last Year: Never true  Ran Out of Food in the Last Year: Never true  Transportation Needs: No Transportation Needs (08/27/2022)   PRAPARE - Hydrologist (Medical): No    Lack of Transportation (Non-Medical): No  Physical Activity: Not on file  Stress: Not on file  Social Connections: Not on file   Additional Social History:    Allergies:   Allergies  Allergen Reactions   Penicillins Hives    Has patient had a PCN reaction causing immediate rash, facial/tongue/throat swelling, SOB or lightheadedness with hypotension: Unknown Has patient had a PCN reaction causing severe rash involving mucus membranes or skin necrosis: Unknown Has patient had a PCN reaction that required hospitalization: Unknown Has patient had a PCN reaction occurring within the last 10 years: No If all of the above answers are "NO", then may proceed with Cephalosporin use.     Labs:  Results for orders placed or performed during the hospital encounter of 09/19/22 (from the past 48 hour(s))  POC CBG, ED     Status: None   Collection Time: 09/19/22  7:42 PM  Result Value Ref Range   Glucose-Capillary 96 70 - 99 mg/dL    Comment: Glucose reference range applies only to samples taken after fasting for at least 8 hours.  Resp Panel by RT-PCR (Flu A&B, Covid) Anterior Nasal Swab     Status: None   Collection Time: 09/19/22  7:45 PM    Specimen: Anterior Nasal Swab  Result Value Ref Range   SARS Coronavirus 2 by RT PCR NEGATIVE NEGATIVE    Comment: (NOTE) SARS-CoV-2 target nucleic acids are NOT DETECTED.  The SARS-CoV-2 RNA is generally detectable in upper respiratory specimens during the acute phase of infection. The lowest concentration of SARS-CoV-2 viral copies this assay can detect is 138 copies/mL. A negative result does not preclude SARS-Cov-2 infection and should not be used as the sole basis for treatment or other patient management decisions. A negative result may occur with  improper specimen collection/handling, submission of specimen other than nasopharyngeal swab, presence of viral mutation(s) within the areas targeted by this assay, and inadequate number of viral copies(<138 copies/mL). A negative result must be combined with clinical observations, patient history, and epidemiological information. The expected result is Negative.  Fact Sheet for Patients:  EntrepreneurPulse.com.au  Fact Sheet for Healthcare Providers:  IncredibleEmployment.be  This test is no t yet approved or cleared by the [place] FDA and  has been authorized for detection and/or diagnosis of SARS-CoV-2 by FDA under an Emergency Use Authorization (EUA). This EUA will remain  in effect (meaning this test can be used) for the duration of the COVID-19 declaration under Section 564(b)(1) of the Act, 21 U.S.C.section 360bbb-3(b)(1), unless the authorization is terminated  or revoked sooner.       Influenza A by PCR NEGATIVE NEGATIVE   Influenza B by PCR NEGATIVE NEGATIVE    Comment: (NOTE) The Xpert Xpress SARS-CoV-2/FLU/RSV plus assay is intended as an aid in the diagnosis of influenza from Nasopharyngeal swab specimens and should not be used as a sole basis for treatment. Nasal washings and aspirates are unacceptable for Xpert Xpress SARS-CoV-2/FLU/RSV testing.  Fact Sheet for  Patients: EntrepreneurPulse.com.au  Fact Sheet for Healthcare Providers: IncredibleEmployment.be  This test is not yet approved or cleared by the [place] FDA and has been authorized for detection and/or diagnosis of SARS-CoV-2 by FDA under an Emergency Use Authorization (EUA). This EUA will remain in effect (meaning this test can be used)  for the duration of the COVID-19 declaration under Section 564(b)(1) of the Act, 21 U.S.C. section 360bbb-3(b)(1), unless the authorization is terminated or revoked.  Performed at [facility], [place] [address], [place], [place] [address]   Comprehensive metabolic panel     Status: Abnormal   Collection Time: 09/19/22  7:54 PM  Result Value Ref Range   Sodium 136 135 - 145 mmol/L   Potassium 4.0 3.5 - 5.1 mmol/L   Chloride 105 98 - 111 mmol/L   CO2 25 22 - 32 mmol/L   Glucose, Bld 96 70 - 99 mg/dL    Comment: Glucose reference range applies only to samples taken after fasting for at least 8 hours.   BUN 18 6 - 20 mg/dL   Creatinine, Ser 0.77 0.44 - 1.00 mg/dL   Calcium 9.5 8.9 - 10.3 mg/dL   Total Protein 8.4 (H) 6.5 - 8.1 g/dL   Albumin 4.1 3.5 - 5.0 g/dL   AST 38 15 - 41 U/L   ALT 27 0 - 44 U/L   Alkaline Phosphatase 102 38 - 126 U/L   Total Bilirubin 0.6 0.3 - 1.2 mg/dL   GFR, Estimated >60 >60 mL/min    Comment: (NOTE) Calculated using the CKD-EPI Creatinine Equation (2021)    Anion gap 6 5 - 15    Comment: Performed at [facility], [place] [address], [place], [place] [address]  Ethanol     Status: None   Collection Time: 09/19/22  7:54 PM  Result Value Ref Range   Alcohol, Ethyl (B) <10 <10 mg/dL    Comment: (NOTE) Lowest detectable limit for serum alcohol is 10 mg/dL.  For medical purposes only. Performed at [facility], [place] [address], [place], [place] [address]   CBC with Diff     Status: Abnormal   Collection Time: 09/19/22   7:54 PM  Result Value Ref Range   WBC 5.4 4.0 - 10.5 K/uL   RBC 3.12 (L) 3.87 - 5.11 MIL/uL   Hemoglobin 10.1 (L) 12.0 - 15.0 g/dL   HCT 30.8 (L) 36.0 - 46.0 %   MCV 98.7 80.0 - 100.0 fL   MCH 32.4 26.0 - 34.0 pg   MCHC 32.8 30.0 - 36.0 g/dL   RDW 13.4 11.5 - 15.5 %   Platelets 128 (L) 150 - 400 K/uL    Comment: SPECIMEN CHECKED FOR CLOTS CONSISTENT WITH PREVIOUS RESULT REPEATED TO VERIFY    nRBC 0.0 0.0 - 0.2 %   Neutrophils Relative % 48 %   Neutro Abs 2.6 1.7 - 7.7 K/uL   Lymphocytes Relative 43 %   Lymphs Abs 2.3 0.7 - 4.0 K/uL   Monocytes Relative 5 %   Monocytes Absolute 0.3 0.1 - 1.0 K/uL   Eosinophils Relative 3 %   Eosinophils Absolute 0.2 0.0 - 0.5 K/uL   Basophils Relative 0 %   Basophils Absolute 0.0 0.0 - 0.1 K/uL   Immature Granulocytes 1 %   Abs Immature Granulocytes 0.04 0.00 - 0.07 K/uL    Comment: Performed at [facility], [place] [address], [place], [place] [address]  hCG, serum, qualitative     Status: None   Collection Time: 09/19/22  7:54 PM  Result Value Ref Range   Preg, Serum NEGATIVE NEGATIVE    Comment:        THE SENSITIVITY OF THIS METHODOLOGY IS >10 mIU/mL. Performed at [facility], [place] [address], [place], [place] [address]  Acetaminophen level     Status: Abnormal   Collection Time: 09/19/22  7:54 PM  Result Value Ref Range   Acetaminophen (Tylenol), Serum <10 (L) 10 - 30 ug/mL    Comment: (NOTE) Therapeutic concentrations vary significantly. A range of 10-30 ug/mL  may be an effective concentration for many patients. However, some  are best treated at concentrations outside of this range. Acetaminophen concentrations >150 ug/mL at 4 hours after ingestion  and >50 ug/mL at 12 hours after ingestion are often associated with  toxic reactions.  Performed at [facility], [place] [address] [place], [place] [address]   Salicylate level     Status: Abnormal   Collection Time:  09/19/22  7:54 PM  Result Value Ref Range   Salicylate Lvl <9.3 (L) 7.0 - 30.0 mg/dL    Comment: Performed at [facility], [place] [address] [place], [place] [address]  Urine rapid drug screen (hosp performed)     Status: None   Collection Time: 09/19/22 10:32 PM  Result Value Ref Range   Opiates NONE DETECTED NONE DETECTED   Cocaine NONE DETECTED NONE DETECTED   Benzodiazepines NONE DETECTED NONE DETECTED   Amphetamines NONE DETECTED NONE DETECTED   Tetrahydrocannabinol NONE DETECTED NONE DETECTED   Barbiturates NONE DETECTED NONE DETECTED    Comment: (NOTE) DRUG SCREEN FOR MEDICAL PURPOSES ONLY.  IF CONFIRMATION IS NEEDED FOR ANY PURPOSE, NOTIFY LAB WITHIN 5 DAYS.  LOWEST DETECTABLE LIMITS FOR URINE DRUG SCREEN Drug Class                     Cutoff (ng/mL) Amphetamine and metabolites    1000 Barbiturate and metabolites    200 Benzodiazepine                 200 Opiates and metabolites        300 Cocaine and metabolites        300 THC                            50 Performed at [facility], [place] [address] [place], [place] [address]     Current Facility-Administered Medications  Medication Dose Route Frequency Provider Last Rate Last Admin   alum & mag hydroxide-simeth (MAALOX/MYLANTA) 200-200-20 MG/5ML suspension 30 mL  30 mL Oral Q6H PRN Palumbo, April, MD       escitalopram (LEXAPRO) tablet 10 mg  10 mg Oral Daily Palumbo, April, MD   10 mg at 96/78/93 8101   folic acid (FOLVITE) tablet 1 mg  1 mg Oral Daily Palumbo, April, MD   1 mg at 09/20/22 0846   ibuprofen (ADVIL) tablet 600 mg  600 mg Oral Q8H PRN Palumbo, April, MD       lipase/protease/amylase (CREON) capsule 36,000 Units  36,000 Units Oral TID Andris Baumann, April, MD       nicotine (NICODERM CQ - dosed in mg/24 hours) patch 21 mg  21 mg Transdermal Daily Palumbo, April, MD       pantoprazole (PROTONIX) EC tablet 40 mg  40 mg Oral QAC breakfast Palumbo, April, MD   40 mg at  09/20/22 0845   thiamine (VITAMIN B1) tablet 100 mg  100 mg Oral Daily Palumbo, April, MD   100 mg at 09/20/22 7510   Current Outpatient Medications  Medication Sig Dispense Refill   Adalimumab (HUMIRA) 40 MG/0.4ML PSKT Inject 40 mg into the skin every  30 (thirty) days.     BIOTIN PO Take 1 tablet by mouth daily.     blood glucose meter kit and supplies KIT Dispense based on patient and insurance preference. Use up to four times daily as directed. (Patient taking differently: Inject 1 each into the skin See admin instructions. Dispense based on patient and insurance preference. Use up to four times daily as directed.) 1 each 0   CALCIUM PO Take 1 tablet by mouth daily.     Cholecalciferol (VITAMIN D3 PO) Take 1 tablet by mouth daily.     cycloSPORINE (RESTASIS) 0.05 % ophthalmic emulsion Place 1 drop into both eyes 2 (two) times daily as needed (watery eyes/irritation).     escitalopram (LEXAPRO) 10 MG tablet Take 1 tablet (10 mg total) by mouth daily. (Patient taking differently: Take 10 mg by mouth daily as needed (as determined by self).) 90 tablet 3   folic acid (FOLVITE) 1 MG tablet Take 1 tablet (1 mg total) by mouth daily. 30 tablet 0   lipase/protease/amylase (CREON) 36000 UNITS CPEP capsule Take 2 capsules (72,000 Units total) by mouth 3 (three) times daily with meals. May also take 1 capsule (36,000 Units total) as needed (with snacks). 240 capsule 11   losartan (COZAAR) 50 MG tablet Take 1 tablet (50 mg total) by mouth daily. 30 tablet 0   nebivolol (BYSTOLIC) 10 MG tablet Take 1 tablet (10 mg total) by mouth daily. 90 tablet 3   PANTOPRAZOLE SODIUM PO Take 1 tablet by mouth daily before breakfast.     potassium chloride SA (KLOR-CON M) 20 MEQ tablet Take 2 tablets (40 mEq total) by mouth daily. 60 tablet 11   thiamine (VITAMIN B1) 100 MG tablet Take 1 tablet (100 mg total) by mouth 2 (two) times daily. 60 tablet 0    Musculoskeletal: Strength & Muscle Tone: within normal  limits Gait & Station: normal Patient leans: N/A   Psychiatric Specialty Exam: Presentation  General Appearance:  Appropriate for Environment; Casual  Eye Contact: Good  Speech: Clear and Coherent; Normal Rate  Speech Volume: Normal  Handedness:No data recorded  Mood and Affect  Mood: Euthymic  Affect: Congruent   Thought Process  Thought Processes: Coherent; Goal Directed; Linear  Descriptions of Associations:Intact  Orientation:Full (Time, Place and Person)  Thought Content:Logical  History of Schizophrenia/Schizoaffective disorder:No data recorded Duration of Psychotic Symptoms:No data recorded Hallucinations:Hallucinations: None  Ideas of Reference:None  Suicidal Thoughts:Suicidal Thoughts: No  Homicidal Thoughts:Homicidal Thoughts: No   Sensorium  Memory: Immediate Good; Recent Good; Remote Good  Judgment: Good  Insight: Good   Executive Functions  Concentration: Good  Attention Span: Good  Recall: Good  Fund of Knowledge: Good  Language: Good   Psychomotor Activity  Psychomotor Activity: Psychomotor Activity: Normal   Assets  Assets: Communication Skills; Desire for Improvement; Financial Resources/Insurance; Housing; Intimacy; Physical Health; Social Support; Resilience    Sleep  Sleep: Sleep: Good   Physical Exam: Physical Exam Constitutional:      General: She is not in acute distress.    Appearance: She is not ill-appearing, toxic-appearing or diaphoretic.  HENT:     Right Ear: External ear normal.     Left Ear: External ear normal.  Eyes:     General:        Right eye: No discharge.        Left eye: No discharge.  Cardiovascular:     Rate and Rhythm: Normal rate.  Pulmonary:     Effort: Pulmonary effort  is normal. No respiratory distress.  Musculoskeletal:        General: Normal range of motion.     Cervical back: Normal range of motion.  Neurological:     Mental Status: She is alert and  oriented to person, place, and time.  Psychiatric:        Mood and Affect: Mood normal.        Behavior: Behavior is cooperative.        Thought Content: Thought content is not paranoid or delusional. Thought content does not include homicidal or suicidal ideation.    Review of Systems  Constitutional:  Negative for chills and diaphoresis.  Respiratory:  Negative for cough and shortness of breath.   Cardiovascular:  Negative for chest pain.  Gastrointestinal:  Negative for diarrhea, nausea and vomiting.  Neurological:  Negative for tremors, seizures and headaches.  Psychiatric/Behavioral:  Positive for substance abuse. Negative for depression, hallucinations, memory loss and suicidal ideas. The patient has insomnia. The patient is not nervous/anxious.    Blood pressure 106/80, pulse 77, temperature 97.7 F (36.5 C), temperature source Oral, resp. rate 16, height _0  (1.727 m), weight 49.9 kg, SpO2 100 %. Body mass index is 16.73 kg/m.  Medical Decision Making: At time of discharge, patient denies SI, HI, AVH and is able to contract for safety. He demonstrated no overt evidence of psychosis or mania. Prior to discharge, Masako verbalized that she understood warning signs, triggers, and symptoms of worsening mental health and how to access emergency mental health care if they felt it was needed. Patient was instructed to call 911 or return to the emergency room if they experienced any concerning symptoms after discharge. Patient voiced understanding and agreed to this.   Disposition: No evidence of imminent risk to self or others at present.   Patient does not meet criteria for psychiatric inpatient admission. Supportive therapy provided about ongoing stressors. Discussed crisis plan, support from social network, calling 911, coming to the Emergency Department, and calling Suicide Hotline.   Follow-up Information     [facility] Schedule an  appointment as soon as possible for a visit.   Why: Alcohol use assessment Contact information: [address] ., [place], [place], [place], [place] [phone]                   Discharge Instructions       Discharge recommendations:  Patient is to take medications as prescribed. Please see information for follow-up appointment with psychiatry and therapy. Please follow up with your primary care provider for all medical related needs.   Therapy: We recommend that patient participate in individual therapy to address mental health concerns.  Medications: The patient is to contact a medical professional and/or outpatient provider to address any new side effects that develop. Patient should update outpatient providers of any new medications and/or medication changes.   Safety:  The patient should abstain from use of illicit substances/drugs and abuse of any medications. If symptoms worsen or do not continue to improve or if the patient becomes actively suicidal or homicidal then it is recommended that the patient return to the closest hospital emergency department, the [facility], or call 911 for further evaluation and treatment. National Suicide Prevention Lifeline 1-800-SUICIDE or (831)365-0942.  About 988 988 offers 24/7 access to trained crisis counselors who can help people experiencing mental health-related distress. People can call or text 988 or chat 988lifeline.org  for themselves or if they are worried about a loved one who may need crisis support.  Crisis Mobile: [facility]:                     (347)733-5598 (for crisis response 24 hours a day) [place]:                                            [phone]       Rozetta Nunnery, NP 09/20/2022 12:46 PM

## 2022-09-20 NOTE — ED Notes (Signed)
Pt dressed in burgundy scrubs upon arrival. Noted pt personal items were not collected. This Probation officer collected pt's personal belonging's and placed in 1 bag, labeled. Bag includes: clothing, shoes and phone. Bag placed in cabinet: Hall [address]. Security wanded pt

## 2022-09-20 NOTE — Discharge Instructions (Addendum)
  Discharge recommendations:  Patient is to take medications as prescribed. Please see information for follow-up appointment with psychiatry and therapy. Please follow up with your primary care provider for all medical related needs.   Therapy: We recommend that patient participate in individual therapy to address mental health concerns.  Medications: The patient is to contact a medical professional and/or outpatient provider to address any new side effects that develop. Patient should update outpatient providers of any new medications and/or medication changes.   Safety:  The patient should abstain from use of illicit substances/drugs and abuse of any medications. If symptoms worsen or do not continue to improve or if the patient becomes actively suicidal or homicidal then it is recommended that the patient return to the closest hospital emergency department, the [facility], or call 911 for further evaluation and treatment. National Suicide Prevention Lifeline 1-800-SUICIDE or 1-800-273-8255.  About 988 988 offers 24/7 access to trained crisis counselors who can help people experiencing mental health-related distress. People can call or text 988 or chat 988lifeline.org for themselves or if they are worried about a loved one who may need crisis support.  Crisis Mobile: [facility]:                     1-877-626-1772 (for crisis response 24 hours a day) [facility] Hotline:                                            1-800-256-2452  

## 2022-09-20 NOTE — BH Assessment (Signed)
Per Annita Brod Fevrier, RN pt is currently sleeping and is unable to engage, if the pt can be assessed during the next shift.    Vertell Novak, [place], [facility], [facility] Triage Specialist [phone]

## 2022-09-20 NOTE — ED Provider Notes (Signed)
[facility] DEPT Provider Note   CSN: [id number] Arrival date & time: 09/19/22  1851     History  Chief Complaint  Patient presents with   Hallucinations    Samantha Rosales is a 41 y.o. female.  The history is provided by the patient.  Mental Health Problem Presenting symptoms: hallucinations   Patient accompanied by:  Family member Degree of incapacity (severity):  Moderate Onset quality:  Gradual Duration:  1 day Timing:  Constant Progression:  Worsening Chronicity:  Recurrent Context: stressful life event   Context comment:  Takes lexapro PRN Relieved by:  Nothing Worsened by:  Nothing Ineffective treatments:  None tried Associated symptoms: no appetite change, no chest pain, no fatigue, no feelings of worthlessness and no poor judgment        Home Medications Prior to Admission medications   Medication Sig Start Date End Date Taking? Authorizing Provider  Adalimumab (HUMIRA) 40 MG/0.4ML PSKT Inject 40 mg into the skin every 30 (thirty) days.    [provider]  BIOTIN PO Take 1 tablet by mouth daily.    [provider]  blood glucose meter kit and supplies KIT Dispense based on patient and insurance preference. Use up to four times daily as directed. Patient taking differently: Inject 1 each into the skin See admin instructions. Dispense based on patient and insurance preference. Use up to four times daily as directed. 06/23/22   [name], MD  CALCIUM PO Take 1 tablet by mouth daily.    [provider]  Cholecalciferol (VITAMIN D3 PO) Take 1 tablet by mouth daily.    [provider]  cycloSPORINE (RESTASIS) 0.05 % ophthalmic emulsion Place 1 drop into both eyes 2 (two) times daily as needed (watery eyes/irritation). 12/31/20   [provider]  escitalopram (LEXAPRO) 10 MG tablet Take 1 tablet (10 mg total) by mouth daily. Patient taking differently: Take 10 mg by mouth daily as needed  (as determined by self). 02/17/22   [name], MD  folic acid (FOLVITE) 1 MG tablet Take 1 tablet (1 mg total) by mouth daily. 08/29/22 09/28/22  Antonieta Pert, MD  lipase/protease/amylase (CREON) 36000 UNITS CPEP capsule Take 2 capsules (72,000 Units total) by mouth 3 (three) times daily with meals. May also take 1 capsule (36,000 Units total) as needed (with snacks). 07/06/22   [name], MD  losartan (COZAAR) 50 MG tablet Take 1 tablet (50 mg total) by mouth daily. 06/24/22   [name], MD  nebivolol (BYSTOLIC) 10 MG tablet Take 1 tablet (10 mg total) by mouth daily. 04/05/21   [name], MD  PANTOPRAZOLE SODIUM PO Take 1 tablet by mouth daily before breakfast.    [provider]  potassium chloride SA (KLOR-CON M) 20 MEQ tablet Take 2 tablets (40 mEq total) by mouth daily. 04/21/22 08/27/22  [name], MD  thiamine (VITAMIN B1) 100 MG tablet Take 1 tablet (100 mg total) by mouth 2 (two) times daily. 08/29/22 09/28/22  Antonieta Pert, MD      Allergies    Penicillins    Review of Systems   Review of Systems  Constitutional:  Negative for appetite change and fatigue.  HENT:  Negative for facial swelling.   Eyes:  Negative for redness.  Respiratory:  Negative for wheezing and stridor.   Cardiovascular:  Negative for chest pain.  Musculoskeletal:  Negative for neck pain and neck stiffness.  Skin:  Negative for rash.  Psychiatric/Behavioral:  Positive  for hallucinations.   All other systems reviewed and are negative.   Physical Exam Updated Vital Signs BP (!) 121/103   Pulse 82   Temp 98 F (36.7 C) (Oral)   Resp 18   Ht 5' 8"  (1.727 m)   Wt 49.9 kg   SpO2 100%   BMI 16.73 kg/m  Physical Exam Vitals and nursing note reviewed.  Constitutional:      General: She is not in acute distress.    Appearance: Normal appearance. She is well-developed.  HENT:     Head: Normocephalic and atraumatic.     Nose: Nose normal.  Eyes:     Pupils: Pupils are  equal, round, and reactive to light.  Cardiovascular:     Rate and Rhythm: Normal rate and regular rhythm.     Pulses: Normal pulses.     Heart sounds: Normal heart sounds.  Pulmonary:     Effort: Pulmonary effort is normal. No respiratory distress.     Breath sounds: Normal breath sounds.  Abdominal:     General: Bowel sounds are normal. There is no distension.     Palpations: Abdomen is soft.     Tenderness: There is no abdominal tenderness. There is no guarding or rebound.  Genitourinary:    Vagina: No vaginal discharge.  Musculoskeletal:        General: Normal range of motion.     Cervical back: Neck supple.  Skin:    General: Skin is warm and dry.     Capillary Refill: Capillary refill takes less than 2 seconds.     Findings: No erythema or rash.  Neurological:     General: No focal deficit present.     Mental Status: She is alert and oriented to person, place, and time.     Deep Tendon Reflexes: Reflexes normal.  Psychiatric:        Attention and Perception: She perceives auditory and visual hallucinations.        Thought Content: Thought content does not include homicidal or suicidal plan.     ED Results / Procedures / Treatments   Labs (all labs ordered are listed, but only abnormal results are displayed) Results for orders placed or performed during the hospital encounter of 09/19/22  Resp Panel by RT-PCR (Flu A&B, Covid) Anterior Nasal Swab   Specimen: Anterior Nasal Swab  Result Value Ref Range   SARS Coronavirus 2 by RT PCR NEGATIVE NEGATIVE   Influenza A by PCR NEGATIVE NEGATIVE   Influenza B by PCR NEGATIVE NEGATIVE  Comprehensive metabolic panel  Result Value Ref Range   Sodium 136 135 - 145 mmol/L   Potassium 4.0 3.5 - 5.1 mmol/L   Chloride 105 98 - 111 mmol/L   CO2 25 22 - 32 mmol/L   Glucose, Bld 96 70 - 99 mg/dL   BUN 18 6 - 20 mg/dL   Creatinine, Ser 0.77 0.44 - 1.00 mg/dL   Calcium 9.5 8.9 - 10.3 mg/dL   Total Protein 8.4 (H) 6.5 - 8.1 g/dL    Albumin 4.1 3.5 - 5.0 g/dL   AST 38 15 - 41 U/L   ALT 27 0 - 44 U/L   Alkaline Phosphatase 102 38 - 126 U/L   Total Bilirubin 0.6 0.3 - 1.2 mg/dL   GFR, Estimated >60 >60 mL/min   Anion gap 6 5 - 15  Ethanol  Result Value Ref Range   Alcohol, Ethyl (B) <10 <10 mg/dL  Urine rapid drug screen (hosp performed)  Result Value Ref Range   Opiates NONE DETECTED NONE DETECTED   Cocaine NONE DETECTED NONE DETECTED   Benzodiazepines NONE DETECTED NONE DETECTED   Amphetamines NONE DETECTED NONE DETECTED   Tetrahydrocannabinol NONE DETECTED NONE DETECTED   Barbiturates NONE DETECTED NONE DETECTED  CBC with Diff  Result Value Ref Range   WBC 5.4 4.0 - 10.5 K/uL   RBC 3.12 (L) 3.87 - 5.11 MIL/uL   Hemoglobin 10.1 (L) 12.0 - 15.0 g/dL   HCT 30.8 (L) 36.0 - 46.0 %   MCV 98.7 80.0 - 100.0 fL   MCH 32.4 26.0 - 34.0 pg   MCHC 32.8 30.0 - 36.0 g/dL   RDW 13.4 11.5 - 15.5 %   Platelets 128 (L) 150 - 400 K/uL   nRBC 0.0 0.0 - 0.2 %   Neutrophils Relative % 48 %   Neutro Abs 2.6 1.7 - 7.7 K/uL   Lymphocytes Relative 43 %   Lymphs Abs 2.3 0.7 - 4.0 K/uL   Monocytes Relative 5 %   Monocytes Absolute 0.3 0.1 - 1.0 K/uL   Eosinophils Relative 3 %   Eosinophils Absolute 0.2 0.0 - 0.5 K/uL   Basophils Relative 0 %   Basophils Absolute 0.0 0.0 - 0.1 K/uL   Immature Granulocytes 1 %   Abs Immature Granulocytes 0.04 0.00 - 0.07 K/uL  hCG, serum, qualitative  Result Value Ref Range   Preg, Serum NEGATIVE NEGATIVE  Acetaminophen level  Result Value Ref Range   Acetaminophen (Tylenol), Serum <10 (L) 10 - 30 ug/mL  Salicylate level  Result Value Ref Range   Salicylate Lvl <5.8 (L) 7.0 - 30.0 mg/dL  POC CBG, ED  Result Value Ref Range   Glucose-Capillary 96 70 - 99 mg/dL   DG Elbow 2 Views Left  Result Date: 09/15/2022 CLINICAL DATA:  Fall. EXAM: LEFT ELBOW - 2 VIEW COMPARISON:  Left humeral radiographs dated February 27, 2022 FINDINGS: There is no evidence of acute fracture, dislocation, or joint  effusion. There is no evidence of arthropathy or other focal bone abnormality. Partially imaged chronic fracture of the left humeral diaphysis. Soft tissues are unremarkable. IMPRESSION: 1. No acute displaced fracture or dislocation of the left elbow. 2. Partially imaged chronic fracture of the left humeral diaphysis. Electronically Signed   By: Keane Police D.O.   On: 09/15/2022 23:20   CT Renal Stone Study  Result Date: 08/26/2022 CLINICAL DATA:  Flank pain; kidney stones suspected EXAM: CT ABDOMEN AND PELVIS WITHOUT CONTRAST TECHNIQUE: Multidetector CT imaging of the abdomen and pelvis was performed following the standard protocol without IV contrast. RADIATION DOSE REDUCTION: This exam was performed according to the departmental dose-optimization program which includes automated exposure control, adjustment of the mA and/or kV according to patient size and/or use of iterative reconstruction technique. COMPARISON:  CT abdomen and pelvis 05/28/2020 FINDINGS: Lower chest: No acute abnormality. Hepatobiliary: Marked hepatic steatosis. No focal liver lesion. No evidence of cholecystitis. Pancreas: Diffuse pancreatic parenchymal calcifications compatible with sequela of chronic pancreatitis. Spleen: Normal in size without focal abnormality. Adrenals/Urinary Tract: Unremarkable adrenal glands. Normal noncontrast appearance of the kidneys. No hydronephrosis or urinary calculi. Unremarkable bladder. Stomach/Bowel: Normal caliber large and small bowel. Normal appendix. Distended stomach. Wall thickening about the terminal ileum. No significant adjacent inflammatory stranding. Vascular/Lymphatic: Mild aortic atherosclerosis. No lymphadenopathy. Reproductive: Unremarkable. Other: No free intraperitoneal air or fluid. Musculoskeletal: Serpiginous sclerosis in both femoral heads compatible with avascular necrosis. No subchondral collapse. Mild sclerosis both SI joints. No acute osseous  abnormality. IMPRESSION: 1. Possible  terminal ileitis though this is incompletely evaluated without IV contrast. 2. Marked hepatic steatosis. 3. Diffuse pancreatic parenchymal calcifications compatible with sequela of chronic pancreatitis. 4. Marked distention of the stomach. 5. Bilateral femoral head avascular necrosis. Electronically Signed   By: Placido Sou M.D.   On: 08/26/2022 04:05   DG Chest Port 1 View  Result Date: 08/26/2022 CLINICAL DATA:  Central chest pain EXAM: PORTABLE CHEST 1 VIEW COMPARISON:  Radiographs 06/20/2022 FINDINGS: No focal consolidation, pleural effusion, or pneumothorax. Normal cardiomediastinal silhouette. No acute osseous abnormality. IMPRESSION: No active disease. Electronically Signed   By: Placido Sou M.D.   On: 08/26/2022 01:12    None  Radiology No results found.  Procedures Procedures    Medications Ordered in ED Medications  alum & mag hydroxide-simeth (MAALOX/MYLANTA) 200-200-20 MG/5ML suspension 30 mL (has no administration in time range)  nicotine (NICODERM CQ - dosed in mg/24 hours) patch 21 mg (has no administration in time range)  ibuprofen (ADVIL) tablet 600 mg (has no administration in time range)  escitalopram (LEXAPRO) tablet 10 mg (has no administration in time range)  lipase/protease/amylase (CREON) capsule 36,000 Units (has no administration in time range)  folic acid (FOLVITE) tablet 1 mg (has no administration in time range)  pantoprazole (PROTONIX) EC tablet 40 mg (has no administration in time range)  thiamine (VITAMIN B1) tablet 100 mg (has no administration in time range)  haloperidol lactate (HALDOL) injection 2 mg (has no administration in time range)    ED Course/ Medical Decision Making/ A&P                           Medical Decision Making AH VH this week called the police today and said the narcotics people were in the house   Amount and/or Complexity of Data Reviewed Independent Historian: spouse    Details: See above  External Data Reviewed:  notes.    Details: Previous notes reviewed  Labs: ordered.    Details: All labs reviewed: covid is negative.  Negative ETOH, tylenol and salicylate. USE is negative.  Pregnancy is negative.   Normal white count 5.4 , hemoglobin low 10.1, platelet count low 128k.  Normal sodium 136, normal potassium 4, normal creatinine .77, normal LFTS  Risk OTC drugs. Prescription drug management. Risk Details: AH and VH.  Taking medication PRN.  Will need to be seen by psychiatry.  Placed on OBS meds ordered     Final Clinical Impression(s) / ED Diagnoses Final diagnoses:  Hallucinations   The patient has been placed in psychiatric observation due to the need to provide a safe environment for the patient while obtaining psychiatric consultation and evaluation, as well as ongoing medical and medication management to treat the patient's condition.  The patient has not been placed under full IVC at this time.    Tra Wilemon, MD 09/20/22 0011

## 2022-09-20 NOTE — ED Provider Notes (Addendum)
Emergency Medicine Observation Re-evaluation Note  Samantha Rosales is a 41 y.o. female, seen on rounds today.  Pt initially presented to the ED for complaints of Hallucinations Currently, the patient is resting.  Physical Exam  BP (!) 121/103   Pulse 82   Temp 98 F (36.7 C) (Oral)   Resp 18   Ht 5\' 8"  (1.727 m)   Wt 49.9 kg   SpO2 100%   BMI 16.73 kg/m  Physical Exam General: no distress  Cardiac: RRR   Lungs: CTAB Psych: calm  Sutured laceration to L elbow with fibrinous exudate. No erythema or drainage. FROM elbow   ED Course / MDM  EKG:   I have reviewed the labs performed to date as well as medications administered while in observation.  Recent changes in the last 24 hours include awaiting TTS evaluation.  Plan  Current plan is for TTS evaluation. Will clean wound and attempt to remove exudates.  Consider suture removal.  Unclear when sutures were placed.  Patient reports was approximately 10 days ago.    Chart review shows sutures were actually placed on October 13. Would not remove sutures at this time.  Wound does not appear to be grossly infected.    Ezequiel Essex, MD 09/20/22 9179    Ezequiel Essex, MD 09/20/22 1000

## 2022-09-20 NOTE — ED Notes (Signed)
Per MD, sutures to remain intact at this time. Wound left open to air at this time.

## 2022-09-20 NOTE — ED Provider Notes (Signed)
Patient cleared by psychiatry for discharge.  No evidence of active life-threatening alcohol withdrawal. Resource guide Given.  No suicidal thoughts or homicidal thoughts.   Ezequiel Essex, MD 09/20/22 1451

## 2022-09-20 NOTE — BH Assessment (Signed)
Clinician messaged Christopher D. Fevrier, RN: "Hey. It's Trey with TTS. Is the pt able to engage in the assessment, if so the pt will need to be placed in a private room. Is the pt under IVC? Also is the pt medically cleared?"    Clinician awaiting response.    Latausha Flamm D Dorleen Kissel, MS, LCMHC, [facility] Triage Specialist [phone]  

## 2022-09-20 NOTE — ED Notes (Signed)
Dressing with vaseline gauze, nonadherent pad, kerlix placed to L elbow. C/D/I at this time.

## 2022-09-20 NOTE — ED Provider Notes (Incomplete)
[facility] DEPT Provider Note   CSN: [id number] Arrival date & time: 09/19/22  1851     History {Add pertinent medical, surgical, social history, OB history to HPI:1} Chief Complaint  Patient presents with  . Hallucinations    Samantha Rosales is a 41 y.o. female.  HPI     Home Medications Prior to Admission medications   Medication Sig Start Date End Date Taking? Authorizing Provider  Adalimumab (HUMIRA) 40 MG/0.4ML PSKT Inject 40 mg into the skin every 30 (thirty) days.    [provider]  BIOTIN PO Take 1 tablet by mouth daily.    [provider]  blood glucose meter kit and supplies KIT Dispense based on patient and insurance preference. Use up to four times daily as directed. Patient taking differently: Inject 1 each into the skin See admin instructions. Dispense based on patient and insurance preference. Use up to four times daily as directed. 06/23/22   [name], MD  CALCIUM PO Take 1 tablet by mouth daily.    [provider]  Cholecalciferol (VITAMIN D3 PO) Take 1 tablet by mouth daily.    [provider]  cycloSPORINE (RESTASIS) 0.05 % ophthalmic emulsion Place 1 drop into both eyes 2 (two) times daily as needed (watery eyes/irritation). 12/31/20   [provider]  escitalopram (LEXAPRO) 10 MG tablet Take 1 tablet (10 mg total) by mouth daily. Patient taking differently: Take 10 mg by mouth daily as needed (as determined by self). 02/17/22   [name], MD  folic acid (FOLVITE) 1 MG tablet Take 1 tablet (1 mg total) by mouth daily. 08/29/22 09/28/22  Antonieta Pert, MD  lipase/protease/amylase (CREON) 36000 UNITS CPEP capsule Take 2 capsules (72,000 Units total) by mouth 3 (three) times daily with meals. May also take 1 capsule (36,000 Units total) as needed (with snacks). 07/06/22   [name], MD  losartan (COZAAR) 50 MG tablet Take 1 tablet (50 mg total) by mouth daily. 06/24/22    [name], MD  nebivolol (BYSTOLIC) 10 MG tablet Take 1 tablet (10 mg total) by mouth daily. 04/05/21   [name], MD  PANTOPRAZOLE SODIUM PO Take 1 tablet by mouth daily before breakfast.    [provider]  potassium chloride SA (KLOR-CON M) 20 MEQ tablet Take 2 tablets (40 mEq total) by mouth daily. 04/21/22 08/27/22  [name], MD  thiamine (VITAMIN B1) 100 MG tablet Take 1 tablet (100 mg total) by mouth 2 (two) times daily. 08/29/22 09/28/22  Antonieta Pert, MD      Allergies    Penicillins    Review of Systems   Review of Systems  Physical Exam Updated Vital Signs BP (!) 121/103   Pulse 82   Temp 98 F (36.7 C) (Oral)   Resp 18   Ht _0  (1.727 m)   Wt 49.9 kg   SpO2 100%   BMI 16.73 kg/m  Physical Exam  ED Results / Procedures / Treatments   Labs (all labs ordered are listed, but only abnormal results are displayed) Labs Reviewed  COMPREHENSIVE METABOLIC PANEL - Abnormal; Notable for the following components:      Result Value   Total Protein 8.4 (*)    All other components within normal limits  CBC WITH DIFFERENTIAL/PLATELET - Abnormal; Notable for the following components:   RBC 3.12 (*)    Hemoglobin 10.1 (*)    HCT 30.8 (*)    Platelets 128 (*)  All other components within normal limits  ACETAMINOPHEN LEVEL - Abnormal; Notable for the following components:   Acetaminophen (Tylenol), Serum <10 (*)    All other components within normal limits  SALICYLATE LEVEL - Abnormal; Notable for the following components:   Salicylate Lvl <2.7 (*)    All other components within normal limits  RESP PANEL BY RT-PCR (FLU A&B, COVID) ARPGX2  ETHANOL  RAPID URINE DRUG SCREEN, HOSP PERFORMED  HCG, SERUM, QUALITATIVE  CBG MONITORING, ED  I-STAT BETA HCG BLOOD, ED ([facility], [facility], [facility] ONLY)    EKG None  Radiology No results found.  Procedures Procedures  {Document cardiac monitor, telemetry assessment procedure when appropriate:1}  Medications  Ordered in ED Medications  alum & mag hydroxide-simeth (MAALOX/MYLANTA) 200-200-20 MG/5ML suspension 30 mL (has no administration in time range)  nicotine (NICODERM CQ - dosed in mg/24 hours) patch 21 mg (has no administration in time range)  ibuprofen (ADVIL) tablet 600 mg (has no administration in time range)  escitalopram (LEXAPRO) tablet 10 mg (has no administration in time range)  lipase/protease/amylase (CREON) capsule 36,000 Units (has no administration in time range)  folic acid (FOLVITE) tablet 1 mg (has no administration in time range)  pantoprazole (PROTONIX) EC tablet 40 mg (has no administration in time range)  thiamine (VITAMIN B1) tablet 100 mg (has no administration in time range)  haloperidol lactate (HALDOL) injection 2 mg (has no administration in time range)    ED Course/ Medical Decision Making/ A&P                           Medical Decision Making Risk OTC drugs. Prescription drug management.   ***  {Document critical care time when appropriate:1} {Document review of labs and clinical decision tools ie heart score, Chads2Vasc2 etc:1}  {Document your independent review of radiology images, and any outside records:1} {Document your discussion with family members, caretakers, and with consultants:1} {Document social determinants of health affecting pt's care:1} {Document your decision making why or why not admission, treatments were needed:1} Final Clinical Impression(s) / ED Diagnoses Final diagnoses:  Hallucinations    Rx / DC Orders ED Discharge Orders     None

## 2022-09-20 NOTE — ED Notes (Signed)
Pt reporting pain and swelling to L elbow. Pt with Coban wrapped around elbow. Pt reports falling 10 days ago, receiving sutures, and mother has been changing dressing. Coban cut with scissors and dressing taken down. Nonadherent pad stuck to patient's skin. Pad soaked with NS and removed. Foul odor and purulent drainage noted from sutured wound. MD notified. Per MD, soak in hydrogen peroxide to get exudate cleaned off. MD to reassess for possible suture removal.

## 2022-09-20 NOTE — ED Notes (Signed)
Pt slept throughout the night. No aggression, agitation, or confusion noted.

## 2022-10-04 DIAGNOSIS — Z419 Encounter for procedure for purposes other than remedying health state, unspecified: Secondary | ICD-10-CM | POA: Diagnosis not present

## 2022-10-13 ENCOUNTER — Other Ambulatory Visit: Payer: BC Managed Care – PPO

## 2022-10-16 ENCOUNTER — Other Ambulatory Visit: Payer: BC Managed Care – PPO

## 2022-11-03 DIAGNOSIS — Z419 Encounter for procedure for purposes other than remedying health state, unspecified: Secondary | ICD-10-CM | POA: Diagnosis not present

## 2022-11-22 ENCOUNTER — Ambulatory Visit
Admission: RE | Admit: 2022-11-22 | Discharge: 2022-11-22 | Disposition: A | Discharge status: Home / Self Care | Payer: BC Managed Care – PPO | Source: Ambulatory Visit | Attending: Family Medicine | Admitting: Family Medicine

## 2022-11-22 VITALS — BP 126/87 | HR 120 | Temp 99.1°F | Resp 18

## 2022-11-22 DIAGNOSIS — Z4802 Encounter for removal of sutures: Secondary | ICD-10-CM | POA: Diagnosis not present

## 2022-11-22 DIAGNOSIS — L089 Local infection of the skin and subcutaneous tissue, unspecified: Secondary | ICD-10-CM

## 2022-11-22 DIAGNOSIS — T148XXA Other injury of unspecified body region, initial encounter: Secondary | ICD-10-CM | POA: Diagnosis not present

## 2022-11-22 DIAGNOSIS — S51012D Laceration without foreign body of left elbow, subsequent encounter: Secondary | ICD-10-CM | POA: Diagnosis not present

## 2022-11-22 MED ORDER — MUPIROCIN 2 % EX OINT: 1.0000 | TOPICAL_OINTMENT | Freq: Two times a day (BID) | CUTANEOUS | 0 refills | Status: DC

## 2022-11-22 MED ORDER — DOXYCYCLINE HYCLATE 100 MG PO CAPS: 100.0000 mg | ORAL_CAPSULE | Freq: Two times a day (BID) | ORAL | 0 refills | Status: DC

## 2022-11-22 MED ORDER — CHLORHEXIDINE GLUCONATE 4 % EX LIQD: Freq: Every day | CUTANEOUS | 0 refills | Status: DC | PRN

## 2022-11-22 NOTE — Discharge Instructions (Signed)
Because the sutures have been in there so long, there are pockets of infection forming around the bases of the sutures.  For this, I have sent over a course of antibiotics.  Please take the full course of doxycycline and clean the area with the Hibiclens solution at least once daily, apply the mupirocin ointment and cover with nonstick gauze and Coban wrap until the area is fully healed up.  Follow-up with your primary care provider for a recheck within the next week.

## 2022-11-22 NOTE — ED Notes (Signed)
Removed 5 sutures from left elbow.

## 2022-11-22 NOTE — ED Provider Notes (Signed)
[facility]    CSN: [id number] Arrival date & time: 11/22/22  1275      History   Chief Complaint No chief complaint on file.   HPI Samantha Rosales is a 41 y.o. female.   Patient presenting today for evaluation of a laceration to her left elbow that occurred on October 13.  She is here today for suture removal.  She was instructed at that time after her 7 sutures were placed to the left elbow to return in 7 days or so for suture removal but she states she was nervous about that so has not come in up until now.  States she put some Neosporin on the area yesterday but apart from that has not been doing anything to the area.  She denies any drainage, fevers, chills, numbness, tingling, decreased range of motion or any other complications since 2 months ago when the sutures were placed.    Past Medical History:  Diagnosis Date   Abnormal Pap smear    Alcohol use    Allergic rhinitis    BV (bacterial vaginosis)    GERD (gastroesophageal reflux disease)    Hepatitis    LFT's >1000 (2019) due to methotrexate vs alcohol   High cholesterol    HSV-2 (herpes simplex virus 2) infection    Hypertension    Hypothyroidism 10/17/2019   Pancreatitis    12/19 (thought to be due to alcohol)   Rheumatoid arthritis (HCC)    Seizures (HCC)    dx as child with epilepsy but no seizures since 89 and on no meds   Vaginal Pap smear, abnormal     Patient Active Problem List   Diagnosis Date Noted   Alcohol use disorder, severe, dependence (HCC) 09/20/2022   Hyperphosphatemia 08/26/2022   Thrombocytopenia (HCC) 08/26/2022   AKI (acute kidney injury) (HCC) 08/26/2022   Abdominal pain 08/26/2022   Nausea and vomiting 08/26/2022   Protein-calorie malnutrition, severe 06/21/2022   Metabolic acidosis 06/20/2022   Alcoholic ketoacidosis 06/20/2022   Hypocalcemia 02/11/2022   MDD (major depressive disorder), recurrent episode, mild (HCC) 02/11/2022   Generalized weakness 02/10/2022    Anemia, chronic disease 02/10/2022   Hypophosphatemia 02/10/2022   Dysphagia 02/10/2022   Chronic fatigue syndrome 09/21/2021   Polyarthralgia 09/21/2021   Closed displaced spiral fracture of shaft of left humerus 09/14/2021   Hepatic steatosis 08/19/2021   Bilateral optic neuropathy 03/22/2021   Retinal edema 03/22/2021   Abnormal auditory perception of both ears 03/21/2021   Pancreatitis    Hypertensive urgency 10/17/2019   Hypokalemia 10/17/2019   Hypomagnesemia 10/17/2019   Alcohol abuse 10/17/2019   Tobacco abuse 10/17/2019   Alcohol withdrawal (HCC) 10/17/2019   Lactic acidosis 10/17/2019   Confusion    Abnormal LFTs 09/05/2018   Elevated lipase 09/05/2018   History of methotrexate therapy 09/05/2018   High triglycerides 09/05/2018   Elevated liver enzymes 07/01/2018   Essential hypertension 07/01/2018   Encounter for well woman exam with routine gynecological exam 07/01/2018   History of abnormal cervical Pap smear 06/26/2017   Encounter for surveillance of injectable contraceptive 06/26/2017   Encounter for gynecological examination with Papanicolaou smear of cervix 06/26/2017   High risk medication use 06/14/2015   Rheumatoid arthritis involving multiple sites with positive rheumatoid factor (HCC) 02/24/2015   Arthritis, rheumatic, acute or subacute 11/18/2013   Urticaria, idiopathic 08/24/2013   HSV-2 (herpes simplex virus 2) infection 05/29/2013   HPV (human papilloma virus) infection 05/29/2013    Past Surgical History:  Procedure Laterality Date   CERVICAL CONIZATION W/BX  12/20/2012   Procedure: CONIZATION CERVIX WITH BIOPSY;  Surgeon: Lazaro Arms, MD;  Location: [facility];  Service: Gynecology;  Laterality: N/A;   HOLMIUM LASER APPLICATION  12/20/2012   Procedure: HOLMIUM LASER APPLICATION;  Surgeon: Lazaro Arms, MD;  Location: [facility];  Service: Gynecology;  Laterality: N/A;    OB History     Gravida  4   Para  2   Term  2   Preterm      AB  2    Living  2      SAB  1   IAB  1   Ectopic      Multiple      Live Births  2            Home Medications    Prior to Admission medications   Medication Sig Start Date End Date Taking? Authorizing Provider  chlorhexidine (HIBICLENS) 4 % external liquid Apply topically daily as needed. 11/22/22  Yes Particia Nearing, PA-C  doxycycline (VIBRAMYCIN) 100 MG capsule Take 1 capsule (100 mg total) by mouth 2 (two) times daily. 11/22/22  Yes Particia Nearing, PA-C  mupirocin ointment (BACTROBAN) 2 % Apply 1 Application topically 2 (two) times daily. 11/22/22  Yes Particia Nearing, PA-C  Adalimumab (HUMIRA) 40 MG/0.4ML PSKT Inject 40 mg into the skin See admin instructions. Inject 40 mg subcutaneously every 2 weeks as needed for flare ups    [provider]  BIOTIN PO Take 1 tablet by mouth daily.    [provider]  CALCIUM PO Take 1 tablet by mouth daily.    [provider]  Cholecalciferol (VITAMIN D3 PO) Take 1 tablet by mouth daily.    [provider]  cycloSPORINE (RESTASIS) 0.05 % ophthalmic emulsion Place 1 drop into both eyes 2 (two) times daily as needed (watery eyes/irritation). Patient not taking: Reported on 09/20/2022 12/31/20   [provider]  escitalopram (LEXAPRO) 10 MG tablet Take 1 tablet (10 mg total) by mouth daily. 02/17/22   Donita Brooks, MD  Ferrous Sulfate (IRON PO) Take 1 tablet by mouth daily.    [provider]  fluticasone (FLONASE) 50 MCG/ACT nasal spray Place 2 sprays into both nostrils as needed for allergies or rhinitis.    [provider]  ibuprofen (ADVIL) 600 MG tablet Take 600 mg by mouth at bedtime as needed (pain).    [provider]  lipase/protease/amylase (CREON) 36000 UNITS CPEP capsule Take 2 capsules (72,000 Units total) by mouth 3 (three) times daily with meals. May also take 1 capsule (36,000 Units total) as needed (with snacks). 07/06/22   Donita Brooks, MD  losartan (COZAAR) 50 MG tablet Take 1 tablet (50 mg total) by mouth daily. 06/24/22   Erick Blinks, MD  MAGNESIUM PO Take 1 tablet by mouth daily.    [provider]  Multiple Vitamins-Minerals (ZINC PO) Take 1 tablet by mouth daily.    [provider]  nebivolol (BYSTOLIC) 10 MG tablet Take 1 tablet (10 mg total) by mouth daily. 04/05/21   Donita Brooks, MD  PANTOPRAZOLE SODIUM PO Take 1 tablet by mouth daily before breakfast. Patient not taking: Reported on 09/20/2022    [provider]  potassium chloride SA (KLOR-CON M) 20 MEQ tablet Take 2 tablets (40 mEq total) by mouth daily. 04/21/22 09/20/22  Donita Brooks, MD    Family History Family History  Problem Relation Age of Onset   Hypertension Mother    Hypertension Father    Pancreatic cancer Paternal Uncle    Colon cancer Neg Hx    Esophageal cancer Neg Hx    Inflammatory bowel disease Neg Hx    Liver disease Neg Hx    Rectal cancer Neg Hx    Stomach cancer Neg Hx     Social History Social History   Tobacco Use   Smoking status: Every Day    Packs/day: 0.50    Years: 15.00    Total pack years: 7.50    Types: Cigarettes   Smokeless tobacco: Never  Vaping Use   Vaping Use: Never used  Substance Use Topics   Alcohol use: Yes    Alcohol/week: 28.0 standard drinks of alcohol    Types: 28 Standard drinks or equivalent per week    Comment: 4-6 drinks per day depending what theyre doing.   Drug use: No     Allergies   Penicillins   Review of Systems Review of Systems Per HPI  Physical Exam Triage Vital Signs ED Triage Vitals  Enc Vitals Group     BP 11/22/22 1013 126/87     Pulse Rate 11/22/22 1013 (!) 120     Resp 11/22/22 1013 18     Temp 11/22/22 1013 99.1 F (37.3 C)     Temp Source 11/22/22 1013 Oral     SpO2 11/22/22 1013 96 %     Weight --      Height --      Head Circumference --      Peak Flow --      Pain Score 11/22/22 1017 0     Pain Loc --       Pain Edu? --      Excl. in GC? --    No data found.  Updated Vital Signs BP 126/87 (BP Location: Right Arm)   Pulse (!) 120   Temp 99.1 F (37.3 C) (Oral)   Resp 18   SpO2 96%   Visual Acuity Right Eye Distance:   Left Eye Distance:   Bilateral Distance:    Right Eye Near:   Left Eye Near:    Bilateral Near:     Physical Exam Vitals and nursing note reviewed.  Constitutional:      Appearance: Normal appearance. She is not ill-appearing.  HENT:     Head: Atraumatic.  Eyes:     Extraocular Movements: Extraocular movements intact.     Conjunctiva/sclera: Conjunctivae normal.  Cardiovascular:     Rate and Rhythm: Normal rate.  Pulmonary:     Effort: Pulmonary effort is normal.  Musculoskeletal:        General: Normal range of motion.     Cervical back: Normal range of motion and neck supple.  Skin:    General: Skin is warm.     Comments: Poorly healing laceration to the left elbow, multiple pockets of pus present to the bases of several of the sutures and thick scar line present over the laceration.  Centrally crusted drainage over the center suture.  Per ED report 2 months ago, 7 sutures were placed but now only 5 sutures are visible  Neurological:     Mental Status: She is alert and oriented to person, place, and time.  Psychiatric:     Comments: Anxious affect      UC Treatments / Results  Labs (all labs ordered are listed, but only abnormal results are displayed)  Labs Reviewed - No data to display  EKG   Radiology No results found.  Procedures Procedures (including critical care time)  Medications Ordered in UC Medications - No data to display  Initial Impression / Assessment and Plan / UC Course  I have reviewed the triage vital signs and the nursing notes.  Pertinent labs & imaging results that were available during my care of the patient were reviewed by me and considered in my medical decision making (see chart for details).      Remaining 5 sutures were removed, unclear if the other 2 fell out on their own.  She does appear to have some pockets of infection likely related to the duration that the sutures have been in place as well as lack of wound care in the interim.  Will place on a course of doxycycline and provide Hibiclens, mupirocin for home wound care.  Nonstick dressing was placed today prior to discharge.  Follow-up with PCP for recheck next week.  Final Clinical Impressions(s) / UC Diagnoses   Final diagnoses:  Infected wound  Visit for suture removal  Elbow laceration, left, subsequent encounter     Discharge Instructions      Because the sutures have been in there so long, there are pockets of infection forming around the bases of the sutures.  For this, I have sent over a course of antibiotics.  Please take the full course of doxycycline and clean the area with the Hibiclens solution at least once daily, apply the mupirocin ointment and cover with nonstick gauze and Coban wrap until the area is fully healed up.  Follow-up with your primary care provider for a recheck within the next week.    ED Prescriptions     Medication Sig Dispense Auth. Provider   doxycycline (VIBRAMYCIN) 100 MG capsule Take 1 capsule (100 mg total) by mouth 2 (two) times daily. 14 capsule Particia Nearing, [place]   chlorhexidine (HIBICLENS) 4 % external liquid Apply topically daily as needed. 120 mL Particia Nearing, PA-C   mupirocin ointment (BACTROBAN) 2 % Apply 1 Application topically 2 (two) times daily. 22 g Particia Nearing, [place]      PDMP not reviewed this encounter.   Particia Nearing, [place] 11/22/22 1058

## 2022-11-22 NOTE — ED Triage Notes (Signed)
Pt reports her six stitches are doing well. She says when she bumps them they are pretty tender. No reports of fever, sever pain or drainage.      Stiches have crusted discharge and total is 5.

## 2022-12-04 DIAGNOSIS — Z419 Encounter for procedure for purposes other than remedying health state, unspecified: Secondary | ICD-10-CM | POA: Diagnosis not present

## 2022-12-04 HISTORY — PX: FRACTURE SURGERY: SHX138

## 2022-12-18 ENCOUNTER — Ambulatory Visit: Payer: BC Managed Care – PPO | Admitting: Family Medicine

## 2023-01-04 DIAGNOSIS — Z419 Encounter for procedure for purposes other than remedying health state, unspecified: Secondary | ICD-10-CM | POA: Diagnosis not present

## 2023-01-22 ENCOUNTER — Ambulatory Visit: Payer: BC Managed Care – PPO | Admitting: Family Medicine

## 2023-02-01 ENCOUNTER — Encounter: Payer: Self-pay | Admitting: Radiology

## 2023-02-02 DIAGNOSIS — Z419 Encounter for procedure for purposes other than remedying health state, unspecified: Secondary | ICD-10-CM | POA: Diagnosis not present

## 2023-02-23 ENCOUNTER — Ambulatory Visit: Payer: BC Managed Care – PPO | Admitting: Family Medicine

## 2023-02-24 DIAGNOSIS — R030 Elevated blood-pressure reading, without diagnosis of hypertension: Secondary | ICD-10-CM | POA: Diagnosis not present

## 2023-03-05 DIAGNOSIS — Z419 Encounter for procedure for purposes other than remedying health state, unspecified: Secondary | ICD-10-CM | POA: Diagnosis not present

## 2023-03-06 ENCOUNTER — Encounter: Payer: Self-pay | Admitting: Family Medicine

## 2023-03-06 ENCOUNTER — Ambulatory Visit: Payer: BC Managed Care – PPO | Admitting: Family Medicine

## 2023-03-06 VITALS — BP 124/85 | HR 98 | Ht 68.0 in | Wt 117.0 lb

## 2023-03-06 DIAGNOSIS — G4700 Insomnia, unspecified: Secondary | ICD-10-CM | POA: Insufficient documentation

## 2023-03-06 DIAGNOSIS — F5101 Primary insomnia: Secondary | ICD-10-CM

## 2023-03-06 DIAGNOSIS — Z131 Encounter for screening for diabetes mellitus: Secondary | ICD-10-CM

## 2023-03-06 DIAGNOSIS — I10 Essential (primary) hypertension: Secondary | ICD-10-CM

## 2023-03-06 DIAGNOSIS — Z13220 Encounter for screening for lipoid disorders: Secondary | ICD-10-CM

## 2023-03-06 DIAGNOSIS — Z1329 Encounter for screening for other suspected endocrine disorder: Secondary | ICD-10-CM

## 2023-03-06 MED ORDER — HYDROXYZINE PAMOATE 25 MG PO CAPS: 25.0000 mg | ORAL_CAPSULE | Freq: Three times a day (TID) | ORAL | 0 refills | Status: DC | PRN

## 2023-03-06 NOTE — Patient Instructions (Signed)
It was pleasure meeting with you today. Please take medications as prescribed. Follow up with your primary health provider if any health concerns arises. If symptoms worsen please contact your primary care provider and/or visit the emergency department.  

## 2023-03-06 NOTE — Assessment & Plan Note (Signed)
Trial hydroxyzine 25 mg PRN Explained to go to bed at the same time each night and get up at the same time each morning, including on the weekends. Make sure your bedroom is quiet, dark, relaxing, and at a comfortable temperature. Remove electronic devices, such as TVs, computers, and smart phones, from the bedroom.  Follow up in 4 weeks

## 2023-03-06 NOTE — Progress Notes (Signed)
New Patient Office Visit   Subjective   Patient ID: Samantha Rosales, female    DOB: Sep 02, 1981  Age: 42 y.o. MRN: [id number]  CC:  Chief Complaint  Patient presents with   Establish Care    HPI Samantha Rosales 42 year female, presents to establish care. She  has a past medical history of Abnormal Pap smear, Alcohol use, Allergic rhinitis, BV (bacterial vaginosis), GERD (gastroesophageal reflux disease), Hepatitis, High cholesterol, HSV-2 (herpes simplex virus 2) infection, Hypertension, Hypothyroidism (10/17/2019), Pancreatitis, Rheumatoid arthritis, Seizures, and Vaginal Pap smear, abnormal.  Insomnia Primary symptoms: sleep disturbance, difficulty falling asleep, frequent awakening.   The problem occurs nightly. The problem has been gradually worsening since onset. The symptoms are aggravated by caffeine and anxiety. How many beverages per day that contain caffeine: 0 - 1.  Types of beverages you drink: coffee. The symptoms are relieved by darkened room and quiet environment. Treatments tried: OTC melatonin. Typical bedtime:  10-11 P.M..  How long after going to bed to you fall asleep: 30-60 minutes.   PMH includes: depression, family stress or anxiety.      Outpatient Encounter Medications as of 03/06/2023  Medication Sig   Adalimumab (HUMIRA) 40 MG/0.4ML PSKT Inject 40 mg into the skin See admin instructions. Inject 40 mg subcutaneously every 2 weeks as needed for flare ups   BIOTIN PO Take 1 tablet by mouth daily.   CALCIUM PO Take 1 tablet by mouth daily.   Cholecalciferol (VITAMIN D3 PO) Take 1 tablet by mouth daily.   cycloSPORINE (RESTASIS) 0.05 % ophthalmic emulsion Place 1 drop into both eyes 2 (two) times daily as needed (watery eyes/irritation).   escitalopram (LEXAPRO) 10 MG tablet Take 1 tablet (10 mg total) by mouth daily.   Ferrous Sulfate (IRON PO) Take 1 tablet by mouth daily.   fluticasone (FLONASE) 50 MCG/ACT nasal spray Place 2 sprays into both nostrils as needed  for allergies or rhinitis.   hydrOXYzine (VISTARIL) 25 MG capsule Take 1 capsule (25 mg total) by mouth every 8 (eight) hours as needed.   MAGNESIUM PO Take 1 tablet by mouth daily.   Multiple Vitamins-Minerals (ZINC PO) Take 1 tablet by mouth daily.   nebivolol (BYSTOLIC) 10 MG tablet Take 1 tablet (10 mg total) by mouth daily.   ibuprofen (ADVIL) 600 MG tablet Take 600 mg by mouth at bedtime as needed (pain).   lipase/protease/amylase (CREON) 36000 UNITS CPEP capsule Take 2 capsules (72,000 Units total) by mouth 3 (three) times daily with meals. May also take 1 capsule (36,000 Units total) as needed (with snacks). (Patient not taking: Reported on 03/06/2023)   losartan (COZAAR) 50 MG tablet Take 1 tablet (50 mg total) by mouth daily. (Patient not taking: Reported on 03/06/2023)   mupirocin ointment (BACTROBAN) 2 % Apply 1 Application topically 2 (two) times daily. (Patient not taking: Reported on 03/06/2023)   PANTOPRAZOLE SODIUM PO Take 1 tablet by mouth daily before breakfast. (Patient not taking: Reported on 03/06/2023)   potassium chloride SA (KLOR-CON M) 20 MEQ tablet Take 2 tablets (40 mEq total) by mouth daily.   [DISCONTINUED] chlorhexidine (HIBICLENS) 4 % external liquid Apply topically daily as needed.   [DISCONTINUED] doxycycline (VIBRAMYCIN) 100 MG capsule Take 1 capsule (100 mg total) by mouth 2 (two) times daily.   No facility-administered encounter medications on file as of 03/06/2023.    Past Surgical History:  Procedure Laterality Date   CERVICAL CONIZATION W/BX  12/20/2012   Procedure: CONIZATION CERVIX WITH BIOPSY;  Surgeon: Florian Buff, MD;  Location: [facility];  Service: Gynecology;  Laterality: N/A;   HOLMIUM LASER APPLICATION  [id number]   Procedure: HOLMIUM LASER APPLICATION;  Surgeon: Florian Buff, MD;  Location: [facility];  Service: Gynecology;  Laterality: N/A;    Review of Systems  Constitutional:  Negative for chills and fever.  Respiratory:  Negative for shortness of  breath.   Cardiovascular:  Negative for chest pain.  Gastrointestinal:  Negative for abdominal pain.  Genitourinary:  Negative for dysuria.  Musculoskeletal:  Negative for myalgias.  Neurological:  Negative for dizziness and headaches.  Psychiatric/Behavioral:  Positive for depression and sleep disturbance. The patient has insomnia.       Objective    BP 124/85   Pulse 98   Ht 5\' 8"  (1.727 m)   Wt 117 lb (53.1 kg)   SpO2 98%   BMI 17.79 kg/m   Physical Exam HENT:     Head: Normocephalic.     Nose: Nose normal.  Cardiovascular:     Rate and Rhythm: Normal rate and regular rhythm.     Pulses: Normal pulses.     Heart sounds: Normal heart sounds.  Pulmonary:     Effort: Pulmonary effort is normal.     Breath sounds: Normal breath sounds.  Abdominal:     General: Bowel sounds are normal.     Palpations: Abdomen is soft. There is no mass.     Tenderness: There is no abdominal tenderness. There is no right CVA tenderness or left CVA tenderness.  Musculoskeletal:        General: Normal range of motion.     Cervical back: Normal range of motion.  Skin:    General: Skin is warm and dry.     Capillary Refill: Capillary refill takes less than 2 seconds.  Neurological:     General: No focal deficit present.     Mental Status: She is alert.     Coordination: Coordination normal.     Gait: Gait normal.  Psychiatric:        Mood and Affect: Mood normal.        Thought Content: Thought content normal.       Assessment & Plan:  Primary insomnia Assessment & Plan: Trial hydroxyzine 25 mg PRN Explained to go to bed at the same time each night and get up at the same time each morning, including on the weekends. Make sure your bedroom is quiet, dark, relaxing, and at a comfortable temperature. Remove electronic devices, such as TVs, computers, and smart phones, from the bedroom.  Follow up in 4 weeks   Primary hypertension -     CBC with Differential/Platelet -      CMP14+EGFR -     Microalbumin / creatinine urine ratio -     hydrOXYzine Pamoate; Take 1 capsule (25 mg total) by mouth every 8 (eight) hours as needed.  Dispense: 30 capsule; Refill: 0  Screening for diabetes mellitus -     Hemoglobin A1c  Screening for lipid disorders -     Lipid panel  Screening for thyroid disorder -     TSH + free T4    Return in about 4 weeks (around 04/03/2023) for Insomnia, Depression, Anxiety, med review.   Renard Hamper Ria Comment, FNP

## 2023-03-08 ENCOUNTER — Telehealth: Payer: Self-pay | Admitting: Family Medicine

## 2023-03-08 LAB — CBC WITH DIFFERENTIAL/PLATELET
Basophils Absolute: 0 10*3/uL (ref 0.0–0.2)
Basos: 1 %
EOS (ABSOLUTE): 0.1 10*3/uL (ref 0.0–0.4)
Eos: 3 %
Hematocrit: 34.4 % (ref 34.0–46.6)
Hemoglobin: 11.5 g/dL (ref 11.1–15.9)
Immature Grans (Abs): 0 10*3/uL (ref 0.0–0.1)
Immature Granulocytes: 1 %
Lymphocytes Absolute: 1.8 10*3/uL (ref 0.7–3.1)
Lymphs: 41 %
MCH: 33.2 pg — ABNORMAL HIGH (ref 26.6–33.0)
MCHC: 33.4 g/dL (ref 31.5–35.7)
MCV: 99 fL — ABNORMAL HIGH (ref 79–97)
Monocytes Absolute: 0.6 10*3/uL (ref 0.1–0.9)
Monocytes: 13 %
Neutrophils Absolute: 1.8 10*3/uL (ref 1.4–7.0)
Neutrophils: 41 %
Platelets: 264 10*3/uL (ref 150–450)
RBC: 3.46 x10E6/uL — ABNORMAL LOW (ref 3.77–5.28)
RDW: 14.6 % (ref 11.7–15.4)
WBC: 4.4 10*3/uL (ref 3.4–10.8)

## 2023-03-08 LAB — MICROALBUMIN / CREATININE URINE RATIO
Creatinine, Urine: 122.5 mg/dL
Microalb/Creat Ratio: 6 mg/g creat (ref 0–29)
Microalbumin, Urine: 7.6 ug/mL

## 2023-03-08 LAB — HEMOGLOBIN A1C
Est. average glucose Bld gHb Est-mCnc: 126 mg/dL
Hgb A1c MFr Bld: 6 % — ABNORMAL HIGH (ref 4.8–5.6)

## 2023-03-08 LAB — LIPID PANEL
Chol/HDL Ratio: 2.9 ratio (ref 0.0–4.4)
Cholesterol, Total: 155 mg/dL (ref 100–199)
HDL: 54 mg/dL (ref >39)
LDL Chol Calc (NIH): 79 mg/dL (ref 0–99)
Triglycerides: 124 mg/dL (ref 0–149)
VLDL Cholesterol Cal: 22 mg/dL (ref 5–40)

## 2023-03-08 LAB — CMP14+EGFR
ALT: 30 IU/L (ref 0–32)
AST: 41 IU/L — ABNORMAL HIGH (ref 0–40)
Albumin/Globulin Ratio: 1.2 (ref 1.2–2.2)
Albumin: 4.2 g/dL (ref 3.9–4.9)
Alkaline Phosphatase: 129 IU/L — ABNORMAL HIGH (ref 44–121)
BUN/Creatinine Ratio: 21 (ref 9–23)
BUN: 14 mg/dL (ref 6–24)
Bilirubin Total: <0.2 mg/dL (ref 0.0–1.2)
CO2: 21 mmol/L (ref 20–29)
Calcium: 9.7 mg/dL (ref 8.7–10.2)
Chloride: 106 mmol/L (ref 96–106)
Creatinine, Ser: 0.66 mg/dL (ref 0.57–1.00)
Globulin, Total: 3.5 g/dL (ref 1.5–4.5)
Glucose: 83 mg/dL (ref 70–99)
Potassium: 5.1 mmol/L (ref 3.5–5.2)
Sodium: 138 mmol/L (ref 134–144)
Total Protein: 7.7 g/dL (ref 6.0–8.5)
eGFR: 112 mL/min/{1.73_m2} (ref >59)

## 2023-03-08 LAB — TSH+FREE T4
Free T4: 0.85 ng/dL (ref 0.82–1.77)
TSH: 2.92 u[IU]/mL (ref 0.450–4.500)

## 2023-03-08 NOTE — Telephone Encounter (Signed)
[employer] called asked can provider call in something for her hot flashes, patient said has already address it with provider this past Tuesday.   Pharmacy  [id number] - [place], [place] - [address] [place] [address] Phone: [phone]  Fax: [phone] DEA #: [id number]

## 2023-03-09 MED ORDER — GABAPENTIN 100 MG PO CAPS: 300.0000 mg | ORAL_CAPSULE | Freq: Three times a day (TID) | ORAL | 3 refills | Status: DC | PRN

## 2023-03-09 MED ORDER — GABAPENTIN 300 MG PO CAPS: 300.0000 mg | ORAL_CAPSULE | Freq: Three times a day (TID) | ORAL | 3 refills | Status: DC | PRN

## 2023-03-09 NOTE — Telephone Encounter (Signed)
Sent!

## 2023-03-17 ENCOUNTER — Other Ambulatory Visit: Payer: Self-pay | Admitting: Family Medicine

## 2023-03-17 DIAGNOSIS — I10 Essential (primary) hypertension: Secondary | ICD-10-CM

## 2023-03-26 ENCOUNTER — Telehealth: Payer: Self-pay | Admitting: Family Medicine

## 2023-03-26 NOTE — Telephone Encounter (Signed)
Pt wants a cll back has a few questions in regard to meds.

## 2023-03-28 ENCOUNTER — Telehealth: Payer: Self-pay | Admitting: Family Medicine

## 2023-03-28 NOTE — Telephone Encounter (Signed)
Patient called left voicemail late yesterday said she called and left a message day before yesterday for provider and nurse to return her call regarding medication but has not yet heard back from anyone. Please return call to [phone].

## 2023-03-28 NOTE — Telephone Encounter (Signed)
LVM

## 2023-03-28 NOTE — Telephone Encounter (Signed)
Spoke with pt

## 2023-04-04 ENCOUNTER — Ambulatory Visit: Payer: BC Managed Care – PPO | Admitting: Family Medicine

## 2023-04-04 DIAGNOSIS — Z419 Encounter for procedure for purposes other than remedying health state, unspecified: Secondary | ICD-10-CM | POA: Diagnosis not present

## 2023-04-12 ENCOUNTER — Other Ambulatory Visit: Payer: Self-pay | Admitting: Family Medicine

## 2023-04-12 ENCOUNTER — Ambulatory Visit (INDEPENDENT_AMBULATORY_CARE_PROVIDER_SITE_OTHER): Payer: BC Managed Care – PPO | Admitting: Family Medicine

## 2023-04-12 ENCOUNTER — Encounter: Payer: Self-pay | Admitting: Family Medicine

## 2023-04-12 VITALS — BP 118/88 | HR 114 | Ht 68.0 in | Wt 110.0 lb

## 2023-04-12 DIAGNOSIS — F5101 Primary insomnia: Secondary | ICD-10-CM

## 2023-04-12 DIAGNOSIS — R232 Flushing: Secondary | ICD-10-CM

## 2023-04-12 DIAGNOSIS — I10 Essential (primary) hypertension: Secondary | ICD-10-CM | POA: Diagnosis not present

## 2023-04-12 MED ORDER — VEOZAH 45 MG PO TABS: 1.0000 | ORAL_TABLET | Freq: Every day | ORAL | 3 refills | Status: DC

## 2023-04-12 MED ORDER — HYDROXYZINE PAMOATE 50 MG PO CAPS: 50.0000 mg | ORAL_CAPSULE | Freq: Three times a day (TID) | ORAL | 3 refills | Status: DC | PRN

## 2023-04-12 NOTE — Assessment & Plan Note (Signed)
Patient requesting increase on Hydroxyzine to 50 mg Continued discussion on being consistent with same sleep schedule, Avoid large meals, caffeine, and alcohol before bedtime.

## 2023-04-12 NOTE — Assessment & Plan Note (Signed)
Vitals:   04/12/23 1025 04/12/23 1027  BP: (!) 137/95 118/88  Blood pressure slightly elevated Patient reported not taking any blood pressure medication. Follow up in 4 weeks with at home blood pressure readings. Continued discussion on DASH diet, low sodium diet and maintain a exercise routine for 150 minutes per week.

## 2023-04-12 NOTE — Assessment & Plan Note (Signed)
Trial Veozah 45 mg daily , Advise avoiding triggers such as spicy foods, caffeine, and alcohol may help relieve hot flashes. Dressing in lightweight, removable layers and losing weight.

## 2023-04-12 NOTE — Patient Instructions (Signed)
        Great to see you today.   - Please take medications as prescribed. - Follow up with your primary health provider if any health concerns arises. - If symptoms worsen please contact your primary care provider and/or visit the emergency department.  

## 2023-04-12 NOTE — Progress Notes (Signed)
Patient Office Visit   Subjective   Patient ID: Samantha Rosales, female    DOB: 1981/02/01  Age: 42 y.o. MRN: [id number]  CC:  Chief Complaint  Patient presents with   Hypertension    Patient is here for f/u of HTN. No changes or concerns since last visit. Complains of hot flashes and wants to know if she can get something for it.     HPI Samantha Rosales 42 year old female,presents to the clinic for HTN follow up, insomnia, and hot flashes. She  has a past medical history of Abnormal Pap smear, Alcohol use, Allergic rhinitis, BV (bacterial vaginosis), GERD (gastroesophageal reflux disease), Hepatitis, High cholesterol, HSV-2 (herpes simplex virus 2) infection, Hypertension, Hypothyroidism (10/17/2019), Pancreatitis, Rheumatoid arthritis (HCC), Seizures (HCC), and Vaginal Pap smear, abnormal.For the details of today's visit, please refer to assessment and plan.   HPI    Outpatient Encounter Medications as of 04/12/2023  Medication Sig   Adalimumab (HUMIRA) 40 MG/0.4ML PSKT Inject 40 mg into the skin See admin instructions. Inject 40 mg subcutaneously every 2 weeks as needed for flare ups   BIOTIN PO Take 1 tablet by mouth daily.   CALCIUM PO Take 1 tablet by mouth daily.   Cholecalciferol (VITAMIN D3 PO) Take 1 tablet by mouth daily.   cycloSPORINE (RESTASIS) 0.05 % ophthalmic emulsion Place 1 drop into both eyes 2 (two) times daily as needed (watery eyes/irritation).   escitalopram (LEXAPRO) 10 MG tablet Take 1 tablet (10 mg total) by mouth daily.   Ferrous Sulfate (IRON PO) Take 1 tablet by mouth daily.   Fezolinetant (VEOZAH) 45 MG TABS Take 1 tablet (45 mg total) by mouth daily.   fluticasone (FLONASE) 50 MCG/ACT nasal spray Place 2 sprays into both nostrils as needed for allergies or rhinitis.   gabapentin (NEURONTIN) 100 MG capsule Take 200 mg by mouth 3 (three) times daily as needed.   hydrOXYzine (VISTARIL) 50 MG capsule Take 1 capsule (50 mg total) by mouth 3 (three) times  daily as needed.   ibuprofen (ADVIL) 600 MG tablet Take 600 mg by mouth at bedtime as needed (pain).   lipase/protease/amylase (CREON) 36000 UNITS CPEP capsule Take 2 capsules (72,000 Units total) by mouth 3 (three) times daily with meals. May also take 1 capsule (36,000 Units total) as needed (with snacks).   losartan (COZAAR) 50 MG tablet Take 1 tablet (50 mg total) by mouth daily.   MAGNESIUM PO Take 1 tablet by mouth daily.   Multiple Vitamins-Minerals (ZINC PO) Take 1 tablet by mouth daily.   mupirocin ointment (BACTROBAN) 2 % Apply 1 Application topically 2 (two) times daily.   nebivolol (BYSTOLIC) 10 MG tablet Take 1 tablet (10 mg total) by mouth daily.   PANTOPRAZOLE SODIUM PO Take 1 tablet by mouth daily before breakfast.   [DISCONTINUED] hydrOXYzine (VISTARIL) 25 MG capsule TAKE 1 CAPSULE (25 MG TOTAL) BY MOUTH EVERY 8 (EIGHT) HOURS AS NEEDED.   gabapentin (NEURONTIN) 300 MG capsule Take 1 capsule (300 mg total) by mouth 3 (three) times daily as needed. (Patient not taking: Reported on 04/12/2023)   potassium chloride SA (KLOR-CON M) 20 MEQ tablet Take 2 tablets (40 mEq total) by mouth daily.   No facility-administered encounter medications on file as of 04/12/2023.    Past Surgical History:  Procedure Laterality Date   CERVICAL CONIZATION W/BX  12/20/2012   Procedure: CONIZATION CERVIX WITH BIOPSY;  Surgeon: Lazaro Arms, MD;  Location: [facility];  Service: Gynecology;  Laterality: N/A;   HOLMIUM LASER APPLICATION  12/20/2012   Procedure: HOLMIUM LASER APPLICATION;  Surgeon: Lazaro Arms, MD;  Location: [facility];  Service: Gynecology;  Laterality: N/A;    Review of Systems  Constitutional:  Negative for chills and fever.  Respiratory:  Negative for shortness of breath.   Cardiovascular:  Negative for chest pain.  Gastrointestinal:  Negative for abdominal pain, heartburn, nausea and vomiting.  Genitourinary:  Negative for dysuria.  Neurological:  Negative for dizziness.       Objective    BP 118/88   Pulse (!) 114   Ht 5\' 8"  (1.727 m)   Wt 110 lb (49.9 kg)   SpO2 97%   BMI 16.73 kg/m   Physical Exam Vitals reviewed.  Constitutional:      General: She is not in acute distress.    Appearance: Normal appearance. She is not ill-appearing, toxic-appearing or diaphoretic.  HENT:     Head: Normocephalic.  Eyes:     General:        Right eye: No discharge.        Left eye: No discharge.     Conjunctiva/sclera: Conjunctivae normal.  Cardiovascular:     Rate and Rhythm: Normal rate.     Pulses: Normal pulses.     Heart sounds: Normal heart sounds.  Pulmonary:     Effort: Pulmonary effort is normal. No respiratory distress.     Breath sounds: Normal breath sounds.  Abdominal:     General: Bowel sounds are normal.     Palpations: Abdomen is soft.     Tenderness: There is no abdominal tenderness. There is no guarding.  Musculoskeletal:        General: Normal range of motion.     Cervical back: Normal range of motion.  Skin:    General: Skin is warm and dry.     Capillary Refill: Capillary refill takes less than 2 seconds.  Neurological:     General: No focal deficit present.     Mental Status: She is alert and oriented to person, place, and time.     Coordination: Coordination normal.     Gait: Gait normal.  Psychiatric:        Mood and Affect: Mood normal.        Behavior: Behavior normal.       Assessment & Plan:  Hot flashes Assessment & Plan: Trial Veozah 45 mg daily , Advise avoiding triggers such as spicy foods, caffeine, and alcohol may help relieve hot flashes. Dressing in lightweight, removable layers and losing weight.    Primary insomnia Assessment & Plan: Patient requesting increase on Hydroxyzine to 50 mg Continued discussion on being consistent with same sleep schedule, Avoid large meals, caffeine, and alcohol before bedtime.    Primary hypertension Assessment & Plan: Vitals:   04/12/23 1025 04/12/23 1027  BP: (!)  137/95 118/88  Blood pressure slightly elevated Patient reported not taking any blood pressure medication. Follow up in 4 weeks with at home blood pressure readings. Continued discussion on DASH diet, low sodium diet and maintain a exercise routine for 150 minutes per week.    Other orders -     hydrOXYzine Pamoate; Take 1 capsule (50 mg total) by mouth 3 (three) times daily as needed.  Dispense: 30 capsule; Refill: 3 -     Veozah; Take 1 tablet (45 mg total) by mouth daily.  Dispense: 30 tablet; Refill: 3    Return in about 4 weeks (around 05/10/2023)  for re-check blood pressure, hypertension.   Cruzita Lederer Newman Nip, FNP

## 2023-04-23 ENCOUNTER — Ambulatory Visit: Payer: BC Managed Care – PPO | Admitting: Adult Health

## 2023-05-05 DIAGNOSIS — Z419 Encounter for procedure for purposes other than remedying health state, unspecified: Secondary | ICD-10-CM | POA: Diagnosis not present

## 2023-05-10 ENCOUNTER — Encounter: Payer: Self-pay | Admitting: Family Medicine

## 2023-05-10 ENCOUNTER — Ambulatory Visit (INDEPENDENT_AMBULATORY_CARE_PROVIDER_SITE_OTHER): Payer: BC Managed Care – PPO | Admitting: Family Medicine

## 2023-05-10 VITALS — BP 148/102 | HR 98 | Ht 68.0 in | Wt 109.0 lb

## 2023-05-10 DIAGNOSIS — I10 Essential (primary) hypertension: Secondary | ICD-10-CM | POA: Diagnosis not present

## 2023-05-10 MED ORDER — LOSARTAN POTASSIUM 25 MG PO TABS: 25.0000 mg | ORAL_TABLET | Freq: Every day | ORAL | 1 refills | Status: DC

## 2023-05-10 MED ORDER — VEOZAH 45 MG PO TABS: 1.0000 | ORAL_TABLET | Freq: Every day | ORAL | 2 refills | Status: DC

## 2023-05-10 NOTE — Progress Notes (Signed)
Patient Office Visit   Subjective   Patient ID: Samantha Rosales, female    DOB: Apr 23, 1981  Age: 42 y.o. MRN: [id number]  CC:  Chief Complaint  Patient presents with   Hypertension    Patient is here for HTN f/u. States she has not been checking her BP at home.     HPI Samantha Rosales 42 year old female, presents to the clinic for HTN follow up She  has a past medical history of Abnormal Pap smear, Alcohol use, Allergic rhinitis, BV (bacterial vaginosis), GERD (gastroesophageal reflux disease), Hepatitis, High cholesterol, HSV-2 (herpes simplex virus 2) infection, Hypertension, Hypothyroidism (10/17/2019), Pancreatitis, Rheumatoid arthritis (HCC), Seizures (HCC), and Vaginal Pap smear, abnormal.For the details of today's visit, please refer to assessment and plan.   HPI  Outpatient Encounter Medications as of 05/10/2023  Medication Sig   Adalimumab (HUMIRA) 40 MG/0.4ML PSKT Inject 40 mg into the skin See admin instructions. Inject 40 mg subcutaneously every 2 weeks as needed for flare ups   BIOTIN PO Take 1 tablet by mouth daily.   CALCIUM PO Take 1 tablet by mouth daily.   Cholecalciferol (VITAMIN D3 PO) Take 1 tablet by mouth daily.   cycloSPORINE (RESTASIS) 0.05 % ophthalmic emulsion Place 1 drop into both eyes 2 (two) times daily as needed (watery eyes/irritation).   escitalopram (LEXAPRO) 10 MG tablet Take 1 tablet (10 mg total) by mouth daily.   Ferrous Sulfate (IRON PO) Take 1 tablet by mouth daily.   Fezolinetant (VEOZAH) 45 MG TABS Take 1 tablet (45 mg total) by mouth daily.   fluticasone (FLONASE) 50 MCG/ACT nasal spray Place 2 sprays into both nostrils as needed for allergies or rhinitis.   gabapentin (NEURONTIN) 100 MG capsule Take 200 mg by mouth 3 (three) times daily as needed.   gabapentin (NEURONTIN) 300 MG capsule Take 1 capsule (300 mg total) by mouth 3 (three) times daily as needed.   hydrOXYzine (VISTARIL) 50 MG capsule Take 1 capsule (50 mg total) by mouth 3 (three)  times daily as needed.   ibuprofen (ADVIL) 600 MG tablet Take 600 mg by mouth at bedtime as needed (pain).   lipase/protease/amylase (CREON) 36000 UNITS CPEP capsule Take 2 capsules (72,000 Units total) by mouth 3 (three) times daily with meals. May also take 1 capsule (36,000 Units total) as needed (with snacks).   losartan (COZAAR) 25 MG tablet Take 1 tablet (25 mg total) by mouth daily.   MAGNESIUM PO Take 1 tablet by mouth daily.   Multiple Vitamins-Minerals (ZINC PO) Take 1 tablet by mouth daily.   mupirocin ointment (BACTROBAN) 2 % Apply 1 Application topically 2 (two) times daily.   nebivolol (BYSTOLIC) 10 MG tablet Take 1 tablet (10 mg total) by mouth daily.   PANTOPRAZOLE SODIUM PO Take 1 tablet by mouth daily before breakfast.   [DISCONTINUED] losartan (COZAAR) 50 MG tablet Take 1 tablet (50 mg total) by mouth daily.   potassium chloride SA (KLOR-CON M) 20 MEQ tablet Take 2 tablets (40 mEq total) by mouth daily.   No facility-administered encounter medications on file as of 05/10/2023.    Past Surgical History:  Procedure Laterality Date   CERVICAL CONIZATION W/BX  12/20/2012   Procedure: CONIZATION CERVIX WITH BIOPSY;  Surgeon: Lazaro Arms, MD;  Location: [facility];  Service: Gynecology;  Laterality: N/A;   HOLMIUM LASER APPLICATION  12/20/2012   Procedure: HOLMIUM LASER APPLICATION;  Surgeon: Lazaro Arms, MD;  Location: [facility];  Service: Gynecology;  Laterality:  N/A;    Review of Systems  Constitutional:  Negative for chills and fever.  Eyes:  Negative for blurred vision.  Respiratory:  Negative for shortness of breath.   Cardiovascular:  Negative for chest pain.  Gastrointestinal:  Negative for abdominal pain.  Genitourinary:  Negative for dysuria.  Neurological:  Negative for dizziness and headaches.      Objective    BP (!) 148/102   Pulse 98   Ht 5\' 8"  (1.727 m)   Wt 109 lb (49.4 kg)   SpO2 100%   BMI 16.57 kg/m   Physical Exam Vitals reviewed.   Constitutional:      General: She is not in acute distress.    Appearance: Normal appearance. She is not ill-appearing, toxic-appearing or diaphoretic.  HENT:     Head: Normocephalic.  Eyes:     General:        Right eye: No discharge.        Left eye: No discharge.     Conjunctiva/sclera: Conjunctivae normal.  Cardiovascular:     Rate and Rhythm: Normal rate.     Pulses: Normal pulses.     Heart sounds: Normal heart sounds.  Pulmonary:     Effort: Pulmonary effort is normal. No respiratory distress.     Breath sounds: Normal breath sounds.  Musculoskeletal:        General: Normal range of motion.     Cervical back: Normal range of motion.  Skin:    General: Skin is warm and dry.     Capillary Refill: Capillary refill takes less than 2 seconds.  Neurological:     General: No focal deficit present.     Mental Status: She is alert and oriented to person, place, and time.     Coordination: Coordination normal.     Gait: Gait normal.  Psychiatric:        Mood and Affect: Mood normal.        Behavior: Behavior normal.       Assessment & Plan:  Primary hypertension Assessment & Plan: Vitals:   05/10/23 0827 05/10/23 0828  BP: (!) 167/103 (!) 148/102   Blood pressure not controlled Started losartan 25 mg daily, Follow up 6 weeks. Discussed to follow a DASH diet which includes vegetables,fruits,whole grains, fat free or low fat diary,fish,poultry,beans,nuts and seeds,vegetable oils. Find an activity that you will enjoy and start to be active at least 5 days a week for 30 minutes each day.   Orders: -     Losartan Potassium; Take 1 tablet (25 mg total) by mouth daily.  Dispense: 30 tablet; Refill: 1    Return in about 6 weeks (around 06/21/2023) for hypertension, re-check blood pressure.   Cruzita Lederer Newman Nip, FNP

## 2023-05-10 NOTE — Patient Instructions (Signed)

## 2023-05-10 NOTE — Assessment & Plan Note (Signed)
Vitals:   05/10/23 0827 05/10/23 0828  BP: (!) 167/103 (!) 148/102   Blood pressure not controlled Started losartan 25 mg daily, Follow up 6 weeks. Discussed to follow a DASH diet which includes vegetables,fruits,whole grains, fat free or low fat diary,fish,poultry,beans,nuts and seeds,vegetable oils. Find an activity that you will enjoy and start to be active at least 5 days a week for 30 minutes each day.

## 2023-06-04 ENCOUNTER — Encounter: Payer: Self-pay | Admitting: Internal Medicine

## 2023-06-04 ENCOUNTER — Other Ambulatory Visit: Payer: Self-pay

## 2023-06-04 ENCOUNTER — Ambulatory Visit (INDEPENDENT_AMBULATORY_CARE_PROVIDER_SITE_OTHER): Payer: BC Managed Care – PPO | Admitting: Internal Medicine

## 2023-06-04 ENCOUNTER — Encounter (HOSPITAL_COMMUNITY): Payer: Self-pay | Admitting: Emergency Medicine

## 2023-06-04 ENCOUNTER — Emergency Department (HOSPITAL_COMMUNITY)
Admission: EM | Admit: 2023-06-04 | Discharge: 2023-06-04 | Discharge status: Against Medical Advice | Payer: BC Managed Care – PPO | Attending: Emergency Medicine | Admitting: Emergency Medicine

## 2023-06-04 VITALS — BP 93/60 | HR 115 | Ht 68.0 in | Wt 107.6 lb

## 2023-06-04 DIAGNOSIS — R040 Epistaxis: Secondary | ICD-10-CM | POA: Insufficient documentation

## 2023-06-04 DIAGNOSIS — I959 Hypotension, unspecified: Secondary | ICD-10-CM | POA: Diagnosis not present

## 2023-06-04 DIAGNOSIS — Z5321 Procedure and treatment not carried out due to patient leaving prior to being seen by health care provider: Secondary | ICD-10-CM | POA: Diagnosis not present

## 2023-06-04 MED ORDER — OXYMETAZOLINE HCL 0.05 % NA SOLN: 1.0000 | Freq: Once | NASAL | Status: DC

## 2023-06-04 NOTE — ED Triage Notes (Signed)
Pt c/o intermittent nose bleeds x 12 hours. Denied injury, no thinners. Bleeding is controlled at time of triage.

## 2023-06-04 NOTE — Progress Notes (Signed)
   Acute Office Visit  Subjective:     Patient ID: Samantha Rosales, female    DOB: 11-28-1981, 42 y.o.   MRN: [id number]  Chief Complaint  Patient presents with   Epistaxis    Started 06/03/2023 around 10pm. Bleeding will stop until clot comes out then it starts pouring again.    Samantha Rosales presents today for an acute visit in the setting of epistaxis.  She reports onset of a nosebleed last night around 10 PM.  She reports that her nose will periodically stop bleeding, but then she will pass large clots and blood will start pouring again.  She denies a history of recurrent epistaxis.  She is not currently on antiplatelet therapy or anticoagulation.  No recent facial trauma. Samantha Rosales additionally endorses dizziness and fatigue.   Review of Systems  Constitutional:  Positive for malaise/fatigue.  HENT:  Positive for nosebleeds.   Neurological:  Positive for dizziness.      Objective:    BP 93/60   Pulse (!) 115   Ht 5\' 8"  (1.727 m)   Wt 107 lb 9.6 oz (48.8 kg)   SpO2 98%   BMI 16.36 kg/m  BP Readings from Last 3 Encounters:  06/04/23 119/74  06/04/23 93/60  05/10/23 (!) 148/102    Physical Exam Vitals reviewed.  Constitutional:      Appearance: Normal appearance.     Comments: Examined in wheelchair  HENT:     Nose:     Comments: Dried blood appreciated in the left nare    Mouth/Throat:     Mouth: Mucous membranes are moist.     Pharynx: Oropharynx is clear. No oropharyngeal exudate or posterior oropharyngeal erythema.  Cardiovascular:     Rate and Rhythm: Normal rate and regular rhythm.     Pulses: Normal pulses.     Heart sounds: Normal heart sounds.  Pulmonary:     Effort: Pulmonary effort is normal.     Breath sounds: Normal breath sounds.  Musculoskeletal:     Cervical back: Normal range of motion.  Neurological:     Mental Status: She is alert.       Assessment & Plan:   Problem List Items Addressed This Visit       Epistaxis - Primary     Presenting today for an acute visit in the setting of epistaxis.  Her nose initially started bleeding around 10 PM last night and will periodically stop until she passes large clots as described above.  No prior history of recurrent epistaxis.  Not on [facility] or antiplatelet therapy.  BP is borderline hypotensive and is significantly decreased since her last appointment.  Of note, she was prescribed losartan 25 mg daily for treatment of hypertension in early June but reports that she has not taken medication today.  She additionally endorses dizziness and lethargy. -Given her current symptoms and persistent epistaxis, I have recommended that Samantha Rosales present to the emergency department for further evaluation and management.  She is in agreement with this plan.      Return if symptoms worsen or fail to improve.  Billie Lade, MD

## 2023-06-04 NOTE — Assessment & Plan Note (Signed)
Presenting today for an acute visit in the setting of epistaxis.  Her nose initially started bleeding around 10 PM last night and will periodically stop until she passes large clots as described above.  No prior history of recurrent epistaxis.  Not on [facility] or antiplatelet therapy.  BP is borderline hypotensive and is significantly decreased since her last appointment.  Of note, she was prescribed losartan 25 mg daily for treatment of hypertension in early June but reports that she has not taken medication today.  She additionally endorses dizziness and lethargy. -Given her current symptoms and persistent epistaxis, I have recommended that Samantha Rosales present to the emergency department for further evaluation and management.  She is in agreement with this plan.

## 2023-06-04 NOTE — Patient Instructions (Signed)
It was a pleasure to see you today.  Thank you for giving [place] the opportunity to be involved in your care.  Below is a brief recap of your visit and next steps.  Summary I recommend presenting to the emergency department for evaluation given your low blood pressure and current symptoms

## 2023-06-12 ENCOUNTER — Ambulatory Visit (INDEPENDENT_AMBULATORY_CARE_PROVIDER_SITE_OTHER): Payer: BC Managed Care – PPO | Admitting: Adult Health

## 2023-06-12 ENCOUNTER — Other Ambulatory Visit (HOSPITAL_COMMUNITY): Admission: RE | Admit: 2023-06-12 | Payer: BC Managed Care – PPO | Source: Ambulatory Visit

## 2023-06-12 ENCOUNTER — Encounter: Payer: Self-pay | Admitting: Adult Health

## 2023-06-12 VITALS — BP 157/100 | HR 104 | Ht 68.0 in | Wt 107.0 lb

## 2023-06-12 DIAGNOSIS — N911 Secondary amenorrhea: Secondary | ICD-10-CM | POA: Diagnosis not present

## 2023-06-12 DIAGNOSIS — Z1211 Encounter for screening for malignant neoplasm of colon: Secondary | ICD-10-CM | POA: Diagnosis not present

## 2023-06-12 DIAGNOSIS — I10 Essential (primary) hypertension: Secondary | ICD-10-CM | POA: Diagnosis not present

## 2023-06-12 DIAGNOSIS — Z01419 Encounter for gynecological examination (general) (routine) without abnormal findings: Secondary | ICD-10-CM

## 2023-06-12 LAB — HEMOCCULT GUIAC POC 1CARD (OFFICE): Fecal Occult Blood, POC: NEGATIVE

## 2023-06-12 LAB — POCT URINE PREGNANCY: Preg Test, Ur: NEGATIVE

## 2023-06-12 MED ORDER — MEDROXYPROGESTERONE ACETATE 10 MG PO TABS: 10.0000 mg | ORAL_TABLET | Freq: Every day | ORAL | 0 refills | Status: DC

## 2023-06-12 NOTE — Progress Notes (Signed)
Patient ID: Samantha Rosales, female   DOB: [birth year], 42 y.o.   MRN: [id number] History of Present Illness: Samantha Rosales is a 42 year old black female, married, [id number], in for a well woman gyn exam and pap. She has not had a period in years, she says, last depo was in 2022.  PCP is I. Polanco NP   Current Medications, Allergies, Past Medical History, Past Surgical History, Family History and Social History were reviewed in [employer] record.     Review of Systems: Patient denies any headaches, hearing loss, fatigue, blurred vision, shortness of breath, chest pain, abdominal pain, problems with urination, or intercourse(not active). No mood swings.  Has RA Has lost weight and has decreased appetite, and when eats has diarrhea, has seen GI [facility]. No period in years   Physical Exam:BP (!) 157/100 (BP Location: Right Arm, Patient Position: Sitting, Cuff Size: Normal)   Pulse (!) 104   Ht 5\' 8"  (1.727 m)   Wt 107 lb (48.5 kg)   BMI 16.27 kg/m  UPT is negative General:  Well developed, well nourished, no acute distress Skin:  Warm and dry Neck:  Midline trachea, normal thyroid, good ROM, no lymphadenopathy Lungs; Clear to auscultation bilaterally Breast:  No dominant palpable mass, retraction, or nipple discharge Cardiovascular: Regular rate and rhythm Abdomen:  Soft, non tender, no hepatosplenomegaly Pelvic:  External genitalia is normal in appearance, no lesions.  The vagina is normal in appearance. Urethra has no lesions or masses. The cervix is smooth, with stenotic os, pap with HR HPV genotyping performed.  Uterus is felt to be normal size, shape, and contour.  No adnexal masses or tenderness noted.Bladder is non tender, no masses felt. Rectal: Good sphincter tone, no polyps, or hemorrhoids felt.  Hemoccult negative. Extremities/musculoskeletal:  No swelling or varicosities noted, no clubbing or cyanosis Psych:  No mood changes, alert and  cooperative,seems happy AA is 5 Fall risk is high     06/12/2023   11:44 AM 06/04/2023   11:28 AM 05/10/2023    8:27 AM  Depression screen PHQ 2/9  Decreased Interest 1 1 0  Down, Depressed, Hopeless 1 1 1   PHQ - 2 Score 2 2 1   Altered sleeping 1 2 1   Tired, decreased energy 1 1 1   Change in appetite 1 1 0  Feeling bad or failure about yourself  1 0 0  Trouble concentrating 1 1 0  Moving slowly or fidgety/restless 0 0 0  Suicidal thoughts 0 0 0  PHQ-9 Score 7 7 3   Difficult doing work/chores   Somewhat difficult       06/12/2023   11:45 AM 06/04/2023   11:29 AM 05/10/2023    8:27 AM 03/06/2023   10:34 AM  GAD 7 : Generalized Anxiety Score  Nervous, Anxious, on Edge 1 1 1 1   Control/stop worrying 1 1 1 1   Worry too much - different things 1 1 1 1   Trouble relaxing 1 1 0 1  Restless 0 1 0 0  Easily annoyed or irritable 1 1 1 1   Afraid - awful might happen 0 1 0 0  Total GAD 7 Score 5 7 4 5   Anxiety Difficulty   Somewhat difficult Somewhat difficult      Upstream - 06/12/23 1155       Pregnancy Intention Screening   Does the patient want to become pregnant in the next year? No    Does the patient's partner want to become  pregnant in the next year? No    Would the patient like to discuss contraceptive options today? No      Contraception Wrap Up   Current Method Abstinence    End Method Female Condom            Examination chaperoned by Malachy Mood LPN  Impression and Plan: 1. Encounter for gynecological examination with Papanicolaou smear of cervix Pap sent Pap in 3 years if normal Physical in 1 year Labs with PCP  - Cytology - PAP( [place])  2. Amenorrhea, secondary UPT is negative Had normal thyroid 03/06/23 Will check PRL and FSH Will rx provera 10 mg for 10 days to see if can get withdrawal bleed Meds ordered this encounter  Medications   medroxyPROGESTERone (PROVERA) 10 MG tablet    Sig: Take 1 tablet (10 mg total) by mouth daily.    Dispense:  10  tablet    Refill:  0    Order Specific Question:   Supervising Provider    Answer:   Duane Lope H [[id number]]   Follow up 07/05/23 for ROS and see if bleeds or not  - Follicle stimulating hormone - Prolactin  3. Encounter for screening fecal occult blood testing Hemoccult was negative   4. Primary hypertension Taking meds she says Follow up with PCP

## 2023-06-13 ENCOUNTER — Other Ambulatory Visit: Payer: Self-pay | Admitting: Family Medicine

## 2023-06-13 DIAGNOSIS — I10 Essential (primary) hypertension: Secondary | ICD-10-CM

## 2023-06-13 LAB — PROLACTIN: Prolactin: 5.5 ng/mL (ref 4.8–33.4)

## 2023-06-13 LAB — FOLLICLE STIMULATING HORMONE: FSH: 15.9 m[IU]/mL

## 2023-06-15 ENCOUNTER — Encounter: Payer: Self-pay | Admitting: Adult Health

## 2023-06-15 ENCOUNTER — Other Ambulatory Visit: Payer: Self-pay | Admitting: Adult Health

## 2023-06-15 DIAGNOSIS — R87612 Low grade squamous intraepithelial lesion on cytologic smear of cervix (LGSIL): Secondary | ICD-10-CM | POA: Insufficient documentation

## 2023-06-15 LAB — CYTOLOGY - PAP
Adequacy: ABSENT
Chlamydia: NEGATIVE
Comment: NEGATIVE
Comment: NEGATIVE
Comment: NORMAL
High risk HPV: NEGATIVE
Neisseria Gonorrhea: NEGATIVE

## 2023-06-15 MED ORDER — METRONIDAZOLE 500 MG PO TABS: 500.0000 mg | ORAL_TABLET | Freq: Two times a day (BID) | ORAL | 0 refills | Status: DC

## 2023-06-18 ENCOUNTER — Telehealth: Payer: Self-pay | Admitting: Adult Health

## 2023-06-18 MED ORDER — PROMETHAZINE HCL 25 MG PO TABS: 25.0000 mg | ORAL_TABLET | Freq: Four times a day (QID) | ORAL | 1 refills | Status: DC | PRN

## 2023-06-18 NOTE — Telephone Encounter (Signed)
Left message that I need you to try to take provera to see if can get a period or not, I have sent in rx for phenergan to take about 20 minutes prior taking provera to see if helps

## 2023-06-18 NOTE — Telephone Encounter (Addendum)
Pt's hormone med is causing vomiting. Has taken it for 2 days and she vomited both times. The first time, she ate something but second day, didn't eat anything. Please advise. Thanks! JSY

## 2023-06-18 NOTE — Addendum Note (Signed)
Addended by: Cyril Mourning A on: 06/18/2023 05:11 PM   Modules accepted: Orders

## 2023-06-18 NOTE — Telephone Encounter (Signed)
Pt states her new hormone medication is not agreeing with her. Pt states she would need another medication

## 2023-06-21 ENCOUNTER — Ambulatory Visit: Payer: Medicare Other | Admitting: Family Medicine

## 2023-06-22 ENCOUNTER — Encounter: Payer: Self-pay | Admitting: Family Medicine

## 2023-06-22 ENCOUNTER — Ambulatory Visit (INDEPENDENT_AMBULATORY_CARE_PROVIDER_SITE_OTHER): Payer: Medicare Other | Admitting: Family Medicine

## 2023-06-22 VITALS — BP 125/81 | HR 96 | Ht 68.0 in | Wt 104.0 lb

## 2023-06-22 DIAGNOSIS — I10 Essential (primary) hypertension: Secondary | ICD-10-CM | POA: Diagnosis not present

## 2023-06-22 NOTE — Assessment & Plan Note (Signed)
Vitals:   06/22/23 0906 06/22/23 0917  BP: 120/83 125/81   Blood pressure controlled in today's visit Advise the importance of medication compliance Continue Losartan 25 mg once daily Continued discussion on DASH diet, low sodium diet and maintain a exercise routine for 150 minutes per week.

## 2023-06-22 NOTE — Progress Notes (Signed)
Patient Office Visit   Subjective   Patient ID: Samantha Rosales, female    DOB: 03-12-1981  Age: 42 y.o. MRN: [id number]  CC:  Chief Complaint  Patient presents with   Hypertension    Patient is here for HTN f/u. No changes or concerns since last visit.     HPI Samantha Rosales 42 year old female, presents to the clinic for HTN follow up. She  has a past medical history of Abnormal Pap smear, Alcohol use, Allergic rhinitis, BV (bacterial vaginosis), GERD (gastroesophageal reflux disease), Hepatitis, High cholesterol, HSV-2 (herpes simplex virus 2) infection, Hypertension, Hypothyroidism (10/17/2019), Pancreatitis, Rheumatoid arthritis (HCC), Seizures (HCC), and Vaginal Pap smear, abnormal.For the details of today's visit, please refer to assessment and plan.   HPI    Outpatient Encounter Medications as of 06/22/2023  Medication Sig   Adalimumab (HUMIRA) 40 MG/0.4ML PSKT Inject 40 mg into the skin See admin instructions. Inject 40 mg subcutaneously every 2 weeks as needed for flare ups   BIOTIN PO Take 1 tablet by mouth daily.   Cholecalciferol (VITAMIN D3 PO) Take 1 tablet by mouth daily.   escitalopram (LEXAPRO) 10 MG tablet Take 1 tablet (10 mg total) by mouth daily.   fluticasone (FLONASE) 50 MCG/ACT nasal spray Place 2 sprays into both nostrils as needed for allergies or rhinitis.   hydrOXYzine (VISTARIL) 50 MG capsule Take 1 capsule (50 mg total) by mouth 3 (three) times daily as needed.   losartan (COZAAR) 25 MG tablet TAKE 1 TABLET (25 MG TOTAL) BY MOUTH DAILY.   MAGNESIUM PO Take 1 tablet by mouth daily.   medroxyPROGESTERone (PROVERA) 10 MG tablet Take 1 tablet (10 mg total) by mouth daily.   metroNIDAZOLE (FLAGYL) 500 MG tablet Take 1 tablet (500 mg total) by mouth 2 (two) times daily.   promethazine (PHENERGAN) 25 MG tablet Take 1 tablet (25 mg total) by mouth every 6 (six) hours as needed for nausea or vomiting.   [DISCONTINUED] CALCIUM PO Take 1 tablet by mouth daily.    [DISCONTINUED] Ferrous Sulfate (IRON PO) Take 1 tablet by mouth daily.   [DISCONTINUED] gabapentin (NEURONTIN) 300 MG capsule Take 1 capsule (300 mg total) by mouth 3 (three) times daily as needed.   [DISCONTINUED] Multiple Vitamins-Minerals (ZINC PO) Take 1 tablet by mouth daily.   [DISCONTINUED] POTASSIUM PO Take by mouth.   No facility-administered encounter medications on file as of 06/22/2023.    Past Surgical History:  Procedure Laterality Date   CERVICAL CONIZATION W/BX  12/20/2012   Procedure: CONIZATION CERVIX WITH BIOPSY;  Surgeon: Lazaro Arms, MD;  Location: [facility];  Service: Gynecology;  Laterality: N/A;   HOLMIUM LASER APPLICATION  12/20/2012   Procedure: HOLMIUM LASER APPLICATION;  Surgeon: Lazaro Arms, MD;  Location: [facility];  Service: Gynecology;  Laterality: N/A;    Review of Systems  Constitutional:  Negative for chills and fever.  Eyes:  Negative for blurred vision.  Respiratory:  Negative for shortness of breath.   Cardiovascular:  Negative for chest pain.  Gastrointestinal:  Negative for abdominal pain.  Neurological:  Negative for dizziness and headaches.      Objective    BP 125/81   Pulse 96   Ht 5\' 8"  (1.727 m)   Wt 104 lb (47.2 kg)   SpO2 98%   BMI 15.81 kg/m   Physical Exam Vitals reviewed.  Constitutional:      General: She is not in acute distress.    Appearance:  Normal appearance. She is not ill-appearing, toxic-appearing or diaphoretic.  HENT:     Head: Normocephalic.  Eyes:     General:        Right eye: No discharge.        Left eye: No discharge.     Conjunctiva/sclera: Conjunctivae normal.  Cardiovascular:     Rate and Rhythm: Normal rate.     Pulses: Normal pulses.     Heart sounds: Normal heart sounds.  Pulmonary:     Effort: Pulmonary effort is normal. No respiratory distress.     Breath sounds: Normal breath sounds.  Musculoskeletal:        General: Normal range of motion.     Cervical back: Normal range of motion.   Skin:    General: Skin is warm and dry.     Capillary Refill: Capillary refill takes less than 2 seconds.  Neurological:     General: No focal deficit present.     Mental Status: She is alert and oriented to person, place, and time.     Coordination: Coordination normal.     Gait: Gait normal.  Psychiatric:        Mood and Affect: Mood normal.        Behavior: Behavior normal.       Assessment & Plan:  Primary hypertension Assessment & Plan: Vitals:   06/22/23 0906 06/22/23 0917  BP: 120/83 125/81   Blood pressure controlled in today's visit Advise the importance of medication compliance Continue Losartan 25 mg once daily Continued discussion on DASH diet, low sodium diet and maintain a exercise routine for 150 minutes per week.      Return in about 4 months (around 10/23/2023) for hypertension, routine labs.   Cruzita Lederer Newman Nip, FNP

## 2023-06-22 NOTE — Patient Instructions (Signed)
        Great to see you today.   - Please take medications as prescribed. - Follow up with your primary health provider if any health concerns arises. - If symptoms worsen please contact your primary care provider and/or visit the emergency department.  

## 2023-06-26 ENCOUNTER — Ambulatory Visit: Payer: Medicare Other | Admitting: Family Medicine

## 2023-06-29 ENCOUNTER — Ambulatory Visit: Payer: Medicare Other

## 2023-06-29 DIAGNOSIS — Z0000 Encounter for general adult medical examination without abnormal findings: Secondary | ICD-10-CM | POA: Diagnosis not present

## 2023-06-29 NOTE — Patient Instructions (Signed)
  Samantha Rosales , Thank you for taking time to come for your Medicare Wellness Visit. I appreciate your ongoing commitment to your health goals. Please review the following plan we discussed and let me know if I can assist you in the future.   These are the goals we discussed:  Goals      Eat Healthy     Timeframe:  Short-Term Goal Priority:  High Start Date:                             Expected End Date:                         - read food labels for fat, fiber, carbohydrates and portion size           This is a list of the screening recommended for you and due dates:  Health Maintenance  Topic Date Due   Medicare Annual Wellness Visit  Never done   COVID-19 Vaccine (3 - Pfizer risk series) 08/13/2023*   Flu Shot  07/05/2023   Pap Smear  06/11/2026   DTaP/Tdap/Td vaccine (3 - Td or Tdap) 09/15/2032   Hepatitis C Screening  Completed   HIV Screening  Completed   HPV Vaccine  Aged Out  *Topic was postponed. The date shown is not the original due date.

## 2023-06-29 NOTE — Progress Notes (Addendum)
Subjective:   Samantha Rosales is a 42 y.o. female who presents for Medicare Annual (Subsequent) preventive examination.  Visit Complete: In person  Patient Medicare AWV questionnaire was completed by the patient on 06/29/2023; I have confirmed that all information answered by patient is correct and no changes since this date.           Objective:    There were no vitals filed for this visit. There is no height or weight on file to calculate BMI.     06/04/2023   12:17 PM 09/19/2022    7:19 PM 09/15/2022    9:58 PM 08/26/2022   12:00 AM 06/20/2022    8:47 AM 02/09/2022    7:59 PM 02/09/2022   10:36 AM  Advanced Directives  Does Patient Have a Medical Advance Directive? No No  No No No No  Would patient like information on creating a medical advance directive?    No - Patient declined No - Patient declined No - Patient declined      Information is confidential and restricted. Go to Review Flowsheets to unlock data.    Current Medications (verified) Outpatient Encounter Medications as of 06/29/2023  Medication Sig   Adalimumab (HUMIRA) 40 MG/0.4ML PSKT Inject 40 mg into the skin See admin instructions. Inject 40 mg subcutaneously every 2 weeks as needed for flare ups   Cholecalciferol (VITAMIN D3 PO) Take 1 tablet by mouth daily.   escitalopram (LEXAPRO) 10 MG tablet Take 1 tablet (10 mg total) by mouth daily.   fluticasone (FLONASE) 50 MCG/ACT nasal spray Place 2 sprays into both nostrils as needed for allergies or rhinitis.   hydrOXYzine (VISTARIL) 50 MG capsule Take 1 capsule (50 mg total) by mouth 3 (three) times daily as needed.   losartan (COZAAR) 25 MG tablet TAKE 1 TABLET (25 MG TOTAL) BY MOUTH DAILY.   MAGNESIUM PO Take 1 tablet by mouth daily.   medroxyPROGESTERone (PROVERA) 10 MG tablet Take 1 tablet (10 mg total) by mouth daily.   metroNIDAZOLE (FLAGYL) 500 MG tablet Take 1 tablet (500 mg total) by mouth 2 (two) times daily.   promethazine (PHENERGAN) 25 MG tablet  Take 1 tablet (25 mg total) by mouth every 6 (six) hours as needed for nausea or vomiting.   No facility-administered encounter medications on file as of 06/29/2023.    Allergies (verified) Penicillins   History: Past Medical History:  Diagnosis Date   Abnormal Pap smear    Alcohol use    Allergic rhinitis    BV (bacterial vaginosis)    GERD (gastroesophageal reflux disease)    Hepatitis    LFT's >1000 (2019) due to methotrexate vs alcohol   High cholesterol    HSV-2 (herpes simplex virus 2) infection    Hypertension    Hypothyroidism 10/17/2019   Pancreatitis    12/19 (thought to be due to alcohol)   Rheumatoid arthritis (HCC)    Seizures (HCC)    dx as child with epilepsy but no seizures since 89 and on no meds   Vaginal Pap smear, abnormal    Past Surgical History:  Procedure Laterality Date   CERVICAL CONIZATION W/BX  12/20/2012   Procedure: CONIZATION CERVIX WITH BIOPSY;  Surgeon: Lazaro Arms, MD;  Location: [facility];  Service: Gynecology;  Laterality: N/A;   HOLMIUM LASER APPLICATION  12/20/2012   Procedure: HOLMIUM LASER APPLICATION;  Surgeon: Lazaro Arms, MD;  Location: [facility];  Service: Gynecology;  Laterality: N/A;   Family History  Problem Relation Age of Onset   Hypertension Mother    Hypertension Father    Pancreatic cancer Paternal Uncle    Colon cancer Neg Hx    Esophageal cancer Neg Hx    Inflammatory bowel disease Neg Hx    Liver disease Neg Hx    Rectal cancer Neg Hx    Stomach cancer Neg Hx    Social History   Socioeconomic History   Marital status: Married    Spouse name: Not on file   Number of children: 2   Years of education: Not on file   Highest education level: Associate degree: occupational, Scientist, product/process development, or vocational program  Occupational History   Occupation: [employer]    Employer: [employer]   Tobacco Use   Smoking status: Every Day    Current packs/day: 0.50    Average packs/day: 0.5 packs/day for  15.0 years (7.5 ttl pk-yrs)    Types: Cigarettes   Smokeless tobacco: Never  Vaping Use   Vaping status: Never Used  Substance and Sexual Activity   Alcohol use: Yes    Alcohol/week: 28.0 standard drinks of alcohol    Types: 28 Standard drinks or equivalent per week    Comment: 4-6 drinks per day depending what theyre doing.   Drug use: No   Sexual activity: Not Currently    Birth control/protection: None, Condom  Other Topics Concern   Not on file  Social History Narrative   Not on file   Social Determinants of Health   Financial Resource Strain: Patient Declined (06/29/2023)   Overall Financial Resource Strain (CARDIA)    Difficulty of Paying Living Expenses: Patient declined  Recent Concern: Financial Resource Strain - Medium Risk (04/11/2023)   Overall Financial Resource Strain (CARDIA)    Difficulty of Paying Living Expenses: Somewhat hard  Food Insecurity: No Food Insecurity (06/29/2023)   Hunger Vital Sign    Worried About Running Out of Food in the Last Year: Never true    Ran Out of Food in the Last Year: Never true  Transportation Needs: No Transportation Needs (06/29/2023)   PRAPARE - Administrator, Civil Service (Medical): No    Lack of Transportation (Non-Medical): No  Physical Activity: Inactive (06/29/2023)   Exercise Vital Sign    Days of Exercise per Week: 0 days    Minutes of Exercise per Session: 0 min  Stress: Stress Concern Present (06/29/2023)   [employer] of Occupational Health - Occupational Stress Questionnaire    Feeling of Stress : Rather much  Social Connections: Moderately Isolated (06/29/2023)   Social Connection and Isolation Panel [NHANES]    Frequency of Communication with Friends and Family: More than three times a week    Frequency of Social Gatherings with Friends and Family: Once a week    Attends Religious Services: Never    Database administrator or Organizations: No    Attends Engineer, structural: Never     Marital Status: Married    Tobacco Counseling Ready to quit: Not Answered Counseling given: Not Answered   Clinical Intake:  Pre-visit preparation completed: Yes  Pain : No/denies pain     Diabetes: No  How often do you need to have someone help you when you read instructions, pamphlets, or other written materials from your doctor or pharmacy?: 1 - Never What is the last grade level you completed in school?: 12th  Interpreter Needed?: No      Activities of Daily Living  06/29/2023    8:20 AM 06/29/2023    6:48 AM  In your present state of health, do you have any difficulty performing the following activities:  Hearing? 0   Vision? 0 1  Difficulty concentrating or making decisions? 0 0  Walking or climbing stairs? 0 0  Dressing or bathing? 0 0  Doing errands, shopping? 0   Using the Toilet?  N  In the past six months, have you accidently leaked urine?  Y  Do you have problems with loss of bowel control?  Y  Managing your Medications?  N  Managing your Finances?  N  Housekeeping or managing your Housekeeping?  N    Patient Care Team: Del Newman Nip, Tenna Child, FNP as PCP - General (Family Medicine)  Indicate any recent Medical Services you may have received from other than [facility] providers in the past year (date may be approximate).     Assessment:   This is a routine wellness examination for Samantha Rosales.  Hearing/Vision screen No results found.  Dietary issues and exercise activities discussed:     Goals Addressed             This Visit's Progress    Eat Healthy       Timeframe:  Short-Term Goal Priority:  High Start Date:                             Expected End Date:                         - read food labels for fat, fiber, carbohydrates and portion size          Depression Screen    06/29/2023    8:20 AM 06/29/2023    8:19 AM 06/12/2023   11:44 AM 06/04/2023   11:28 AM 05/10/2023    8:27 AM 03/06/2023   10:34 AM 07/05/2021    9:07 AM  PHQ 2/9  Scores  PHQ - 2 Score 0 0 2 2 1 1 1   PHQ- 9 Score 0 0 7 7 3 6 9     Fall Risk    06/29/2023    6:48 AM 06/12/2023   11:45 AM 06/04/2023   11:28 AM 05/10/2023    8:26 AM 03/06/2023   10:34 AM  Fall Risk   Falls in the past year? 1 1 1 1 1   Number falls in past yr: 0 1 0 1 1  Injury with Fall? 1 1 1 1 1   Risk for fall due to :   Impaired balance/gait    Follow up   Falls evaluation completed      MEDICARE RISK AT HOME:   TIMED UP AND GO:  Was the test performed?  Yes  Length of time to ambulate 10 feet:  sec Gait steady and fast without use of assistive device    Cognitive Function:        06/29/2023    8:20 AM  6CIT Screen  What Year? 0 points  What month? 0 points  What time? 0 points  Count back from 20 0 points  Months in reverse 0 points  Repeat phrase 0 points  Total Score 0 points    Immunizations Immunization History  Administered Date(s) Administered   Influenza Split 09/03/2013, 09/23/2013   Influenza,inj,Quad PF,6+ Mos 09/23/2013, 09/22/2016, 08/27/2017, 09/11/2017   Influenza,inj,quad, With Preservative 09/03/2017   PFIZER(Purple Top)SARS-COV-2 Vaccination 03/26/2020, 04/19/2020  PPD Test 11/02/2010   Pneumococcal Conjugate-13 11/08/2015   Tdap 10/30/2012, 09/15/2022    TDAP status: Up to date  Flu Vaccine status: Declined, Education has been provided regarding the importance of this vaccine but patient still declined. Advised may receive this vaccine at local pharmacy or Health Dept. Aware to provide a copy of the vaccination record if obtained from local pharmacy or Health Dept. Verbalized acceptance and understanding.  Pneumococcal vaccine status: Up to date  Covid-19 vaccine status: Information provided on how to obtain vaccines.   Qualifies for Shingles Vaccine? Yes   Zostavax completed Yes   Shingrix Completed?: Yes  Screening Tests Health Maintenance  Topic Date Due   Medicare Annual Wellness (AWV)  Never done   COVID-19 Vaccine (3 -  Pfizer risk series) 08/13/2023 (Originally 05/17/2020)   INFLUENZA VACCINE  07/05/2023   PAP SMEAR-Modifier  06/11/2026   DTaP/Tdap/Td (3 - Td or Tdap) 09/15/2032   Hepatitis C Screening  Completed   HIV Screening  Completed   HPV VACCINES  Aged Out    Health Maintenance  Health Maintenance Due  Topic Date Due   Medicare Annual Wellness (AWV)  Never done    Colorectal cancer screening: No longer required.   Mammogram status: Ordered 06/29/23. Pt provided with contact info and advised to call to schedule appt.    Lung Cancer Screening: (Low Dose CT Chest recommended if Age 43-80 years, 20 pack-year currently smoking OR have quit w/in 15years.) does not qualify.   Lung Cancer Screening Referral: n/a  Additional Screening:  Hepatitis C Screening: does not qualify; Completed   Vision Screening: Recommended annual ophthalmology exams for early detection of glaucoma and other disorders of the eye. Is the patient up to date with their annual eye exam?  no Who is the provider or what is the name of the office in which the patient attends annual eye exams? N/a If pt is not established with a provider, would they like to be referred to a provider to establish care? No .   Dental Screening: Recommended annual dental exams for proper oral hygiene  Community Resource Referral / Chronic Care Management: CRR required this visit?  No   CCM required this visit?  No     Plan:     I have personally reviewed and noted the following in the patient's chart:   Medical and social history Use of alcohol, tobacco or illicit drugs  Current medications and supplements including opioid prescriptions. Patient is not currently taking opioid prescriptions. Functional ability and status Nutritional status Physical activity Advanced directives List of other physicians Hospitalizations, surgeries, and ER visits in previous 12 months Vitals Screenings to include cognitive, depression, and  falls Referrals and appointments  In addition, I have reviewed and discussed with patient certain preventive protocols, quality metrics, and best practice recommendations. A written personalized care plan for preventive services as well as general preventive health recommendations were provided to patient.     Herbie Saxon, [place]   06/29/2023   After Visit Summary: (Pick Up) Due to this being a telephonic visit, with patients personalized plan was offered to patient and patient has requested to Pick up at office.  Nurse Notes: n/a

## 2023-07-05 ENCOUNTER — Ambulatory Visit: Payer: BC Managed Care – PPO | Admitting: Adult Health

## 2023-07-13 ENCOUNTER — Encounter (HOSPITAL_COMMUNITY): Payer: Self-pay

## 2023-07-13 ENCOUNTER — Emergency Department (HOSPITAL_COMMUNITY): Payer: BC Managed Care – PPO

## 2023-07-13 ENCOUNTER — Emergency Department (HOSPITAL_COMMUNITY)
Admission: EM | Admit: 2023-07-13 | Discharge: 2023-07-13 | Disposition: A | Discharge status: Home / Self Care | Payer: Medicare Other | Attending: Emergency Medicine | Admitting: Emergency Medicine

## 2023-07-13 DIAGNOSIS — E039 Hypothyroidism, unspecified: Secondary | ICD-10-CM | POA: Insufficient documentation

## 2023-07-13 DIAGNOSIS — I160 Hypertensive urgency: Secondary | ICD-10-CM | POA: Diagnosis not present

## 2023-07-13 DIAGNOSIS — I10 Essential (primary) hypertension: Secondary | ICD-10-CM | POA: Diagnosis not present

## 2023-07-13 DIAGNOSIS — F1010 Alcohol abuse, uncomplicated: Secondary | ICD-10-CM | POA: Diagnosis not present

## 2023-07-13 DIAGNOSIS — Z79899 Other long term (current) drug therapy: Secondary | ICD-10-CM | POA: Insufficient documentation

## 2023-07-13 DIAGNOSIS — W19XXXA Unspecified fall, initial encounter: Secondary | ICD-10-CM | POA: Diagnosis not present

## 2023-07-13 DIAGNOSIS — F17210 Nicotine dependence, cigarettes, uncomplicated: Secondary | ICD-10-CM | POA: Diagnosis not present

## 2023-07-13 DIAGNOSIS — E876 Hypokalemia: Secondary | ICD-10-CM | POA: Insufficient documentation

## 2023-07-13 DIAGNOSIS — D649 Anemia, unspecified: Secondary | ICD-10-CM | POA: Insufficient documentation

## 2023-07-13 DIAGNOSIS — Z23 Encounter for immunization: Secondary | ICD-10-CM | POA: Diagnosis not present

## 2023-07-13 DIAGNOSIS — R55 Syncope and collapse: Secondary | ICD-10-CM | POA: Diagnosis present

## 2023-07-13 DIAGNOSIS — S0990XA Unspecified injury of head, initial encounter: Secondary | ICD-10-CM

## 2023-07-13 LAB — URINALYSIS, ROUTINE W REFLEX MICROSCOPIC
Bilirubin Urine: NEGATIVE
Glucose, UA: NEGATIVE mg/dL
Hgb urine dipstick: NEGATIVE
Ketones, ur: NEGATIVE mg/dL
Leukocytes,Ua: NEGATIVE
Nitrite: NEGATIVE
Protein, ur: NEGATIVE mg/dL
Specific Gravity, Urine: 1.003 — ABNORMAL LOW (ref 1.005–1.030)
pH: 6 (ref 5.0–8.0)

## 2023-07-13 LAB — CBC WITH DIFFERENTIAL/PLATELET
Abs Immature Granulocytes: 0.03 10*3/uL (ref 0.00–0.07)
Basophils Absolute: 0 10*3/uL (ref 0.0–0.1)
Basophils Relative: 0 %
Eosinophils Absolute: 0 10*3/uL (ref 0.0–0.5)
Eosinophils Relative: 1 %
HCT: 26.7 % — ABNORMAL LOW (ref 36.0–46.0)
Hemoglobin: 9 g/dL — ABNORMAL LOW (ref 12.0–15.0)
Immature Granulocytes: 1 %
Lymphocytes Relative: 42 %
Lymphs Abs: 1.7 10*3/uL (ref 0.7–4.0)
MCH: 32.1 pg (ref 26.0–34.0)
MCHC: 33.7 g/dL (ref 30.0–36.0)
MCV: 95.4 fL (ref 80.0–100.0)
Monocytes Absolute: 0.2 10*3/uL (ref 0.1–1.0)
Monocytes Relative: 5 %
Neutro Abs: 2.1 10*3/uL (ref 1.7–7.7)
Neutrophils Relative %: 51 %
Platelets: UNDETERMINED 10*3/uL (ref 150–400)
RBC: 2.8 MIL/uL — ABNORMAL LOW (ref 3.87–5.11)
RDW: 15.4 % (ref 11.5–15.5)
WBC: 4.1 10*3/uL (ref 4.0–10.5)
nRBC: 0 % (ref 0.0–0.2)

## 2023-07-13 LAB — COMPREHENSIVE METABOLIC PANEL
ALT: 52 U/L — ABNORMAL HIGH (ref 0–44)
AST: 131 U/L — ABNORMAL HIGH (ref 15–41)
Albumin: 3.3 g/dL — ABNORMAL LOW (ref 3.5–5.0)
Alkaline Phosphatase: 165 U/L — ABNORMAL HIGH (ref 38–126)
Anion gap: 15 (ref 5–15)
BUN: 5 mg/dL — ABNORMAL LOW (ref 6–20)
CO2: 20 mmol/L — ABNORMAL LOW (ref 22–32)
Calcium: 7.5 mg/dL — ABNORMAL LOW (ref 8.9–10.3)
Chloride: 100 mmol/L (ref 98–111)
Creatinine, Ser: 0.93 mg/dL (ref 0.44–1.00)
GFR, Estimated: >60 mL/min (ref >60)
Glucose, Bld: 124 mg/dL — ABNORMAL HIGH (ref 70–99)
Potassium: 2.8 mmol/L — ABNORMAL LOW (ref 3.5–5.1)
Sodium: 135 mmol/L (ref 135–145)
Total Bilirubin: 1.1 mg/dL (ref 0.3–1.2)
Total Protein: 6.7 g/dL (ref 6.5–8.1)

## 2023-07-13 LAB — TROPONIN I (HIGH SENSITIVITY)
Troponin I (High Sensitivity): 3 ng/L (ref <18)
Troponin I (High Sensitivity): 4 ng/L (ref <18)

## 2023-07-13 LAB — RAPID URINE DRUG SCREEN, HOSP PERFORMED
Amphetamines: NOT DETECTED
Barbiturates: NOT DETECTED
Benzodiazepines: NOT DETECTED
Cocaine: NOT DETECTED
Opiates: NOT DETECTED
Tetrahydrocannabinol: NOT DETECTED

## 2023-07-13 LAB — I-STAT CG4 LACTIC ACID, ED
Lactic Acid, Venous: 4.2 mmol/L (ref 0.5–1.9)
Lactic Acid, Venous: 1.5 mmol/L (ref 0.5–1.9)

## 2023-07-13 LAB — ETHANOL: Alcohol, Ethyl (B): <10 mg/dL (ref <10)

## 2023-07-13 LAB — HCG, QUANTITATIVE, PREGNANCY: hCG, Beta Chain, Quant, S: <1 m[IU]/mL (ref <5)

## 2023-07-13 MED ORDER — POTASSIUM CHLORIDE 10 MEQ/100ML IV SOLN
10.0000 meq | INTRAVENOUS | Status: AC
  Administered 2023-07-13 (×2): 10 meq via INTRAVENOUS
  Filled 2023-07-13 (×2): qty 100

## 2023-07-13 MED ORDER — POTASSIUM CHLORIDE CRYS ER 20 MEQ PO TBCR: 20.0000 meq | EXTENDED_RELEASE_TABLET | Freq: Every day | ORAL | 0 refills | Status: DC

## 2023-07-13 MED ORDER — SODIUM CHLORIDE 0.9 % IV SOLN: INTRAVENOUS | Status: DC

## 2023-07-13 MED ORDER — TETANUS-DIPHTH-ACELL PERTUSSIS 5-2.5-18.5 LF-MCG/0.5 IM SUSY
0.5000 mL | PREFILLED_SYRINGE | Freq: Once | INTRAMUSCULAR | Status: AC
  Administered 2023-07-13: 0.5 mL via INTRAMUSCULAR
  Filled 2023-07-13: qty 0.5

## 2023-07-13 MED ORDER — SODIUM CHLORIDE 0.9 % IV BOLUS
1000.0000 mL | Freq: Once | INTRAVENOUS | Status: AC
  Administered 2023-07-13: 1000 mL via INTRAVENOUS

## 2023-07-13 MED ORDER — ACETAMINOPHEN 325 MG PO TABS: 650.0000 mg | ORAL_TABLET | Freq: Four times a day (QID) | ORAL | 0 refills | Status: DC | PRN

## 2023-07-13 NOTE — ED Provider Notes (Signed)
EMERGENCY DEPARTMENT AT [facility] Provider Note  CSN: [id number] Arrival date & time: 07/13/23 1627  Chief Complaint(s) level 2 fall and Fall  HPI Samantha Rosales is a 42 y.o. female with past medical history as below, significant for etoh abuse (last etoh 2 days ago), GERD, HLD, HSV2, HTN, RA, seizures (last 1999 no AED's) who presents to the ED with complaint of fall/head injury/ syncope.   Pt reports walking out to her driveway and then reported that she felt "woozy" and woke up on the ground. Per EMS mother reported that she saw the pt fall backwards onto the ground and had brief LOC. She was confused following syncope but en route mental status greatly improving. Pt complaining on pain to sacrum that she attributed to back board but improved once placed on stretcher. She has no complaints o/w. She does not recall the fall specifically but does recall walking out to her car and then woke up with EMS around.   Pt reports that she stopped drinking etoh 1-2 days ago, usually drinks 3x tallboy of [place]cl[place] daily. Denies hx etoh withdrawal seizure  Past Medical History Past Medical History:  Diagnosis Date   Abnormal Pap smear    Alcohol use    Allergic rhinitis    BV (bacterial vaginosis)    GERD (gastroesophageal reflux disease)    Hepatitis    LFT's >1000 (2019) due to methotrexate vs alcohol   High cholesterol    HSV-2 (herpes simplex virus 2) infection    Hypertension    Hypothyroidism 10/17/2019   Pancreatitis    12/19 (thought to be due to alcohol)   Rheumatoid arthritis (HCC)    Seizures (HCC)    dx as child with epilepsy but no seizures since 89 and on no meds   Vaginal Pap smear, abnormal    Patient Active Problem List   Diagnosis Date Noted   LGSIL on Pap smear of cervix 06/15/2023   Encounter for screening fecal occult blood testing 06/12/2023   Amenorrhea, secondary 06/12/2023   Epistaxis 06/04/2023   Hot flashes 04/12/2023   Insomnia  03/06/2023   Alcohol use disorder, severe, dependence (HCC) 09/20/2022   Hyperphosphatemia 08/26/2022   Thrombocytopenia (HCC) 08/26/2022   AKI (acute kidney injury) (HCC) 08/26/2022   Abdominal pain 08/26/2022   Nausea and vomiting 08/26/2022   Protein-calorie malnutrition, severe 06/21/2022   Metabolic acidosis 06/20/2022   Alcoholic ketoacidosis 06/20/2022   Hypocalcemia 02/11/2022   MDD (major depressive disorder), recurrent episode, mild (HCC) 02/11/2022   Generalized weakness 02/10/2022   Anemia, chronic disease 02/10/2022   Hypophosphatemia 02/10/2022   Dysphagia 02/10/2022   Chronic fatigue syndrome 09/21/2021   Polyarthralgia 09/21/2021   Closed displaced spiral fracture of shaft of left humerus 09/14/2021   Hepatic steatosis 08/19/2021   Bilateral optic neuropathy 03/22/2021   Retinal edema 03/22/2021   Abnormal auditory perception of both ears 03/21/2021   Pancreatitis    Hypertensive urgency 10/17/2019   Hypokalemia 10/17/2019   Hypomagnesemia 10/17/2019   Alcohol abuse 10/17/2019   Tobacco abuse 10/17/2019   Alcohol withdrawal (HCC) 10/17/2019   Lactic acidosis 10/17/2019   Confusion    Abnormal LFTs 09/05/2018   Elevated lipase 09/05/2018   History of methotrexate therapy 09/05/2018   High triglycerides 09/05/2018   Elevated liver enzymes 07/01/2018   Primary hypertension 07/01/2018   Encounter for well woman exam with routine gynecological exam 07/01/2018   History of abnormal cervical Pap smear 06/26/2017   Encounter for surveillance  of injectable contraceptive 06/26/2017   Encounter for gynecological examination with Papanicolaou smear of cervix 06/26/2017   High risk medication use 06/14/2015   Rheumatoid arthritis involving multiple sites with positive rheumatoid factor (HCC) 02/24/2015   Arthritis, rheumatic, acute or subacute 11/18/2013   Urticaria, idiopathic 08/24/2013   HSV-2 (herpes simplex virus 2) infection 05/29/2013   HPV (human papilloma  virus) infection 05/29/2013   Home Medication(s) Prior to Admission medications   Medication Sig Start Date End Date Taking? Authorizing Provider  acetaminophen (TYLENOL) 325 MG tablet Take 2 tablets (650 mg total) by mouth every 6 (six) hours as needed. 07/13/23  Yes Tanda Rockers A, DO  CALCIUM PO Take 1 tablet by mouth daily.   Yes [provider]  Cholecalciferol (VITAMIN D3 PO) Take 1 tablet by mouth daily.   Yes [provider]  fluticasone (FLONASE) 50 MCG/ACT nasal spray Place 2 sprays into both nostrils as needed for allergies or rhinitis.   Yes [provider]  gabapentin (NEURONTIN) 300 MG capsule Take 300 mg by mouth daily as needed.   Yes [provider]  hydrOXYzine (VISTARIL) 50 MG capsule Take 1 capsule (50 mg total) by mouth 3 (three) times daily as needed. Patient taking differently: Take 50 mg by mouth 3 (three) times daily as needed for itching or anxiety. 04/12/23  Yes Del Newman Nip, Tenna Child, FNP  losartan (COZAAR) 25 MG tablet TAKE 1 TABLET (25 MG TOTAL) BY MOUTH DAILY. 06/13/23  Yes Del Newman Nip, Tenna Child, FNP  MAGNESIUM PO Take 1 tablet by mouth daily.   Yes [provider]  Multiple Vitamin (MULTIVITAMIN WITH MINERALS) TABS tablet Take 1 tablet by mouth daily.   Yes [provider]  Multiple Vitamins-Minerals (ZINC PO) Take 1 tablet by mouth daily.   Yes [provider]  potassium chloride SA (KLOR-CON M) 20 MEQ tablet Take 1 tablet (20 mEq total) by mouth daily for 3 days. 07/14/23 07/17/23 Yes Sloan Leiter, DO  promethazine (PHENERGAN) 25 MG tablet Take 1 tablet (25 mg total) by mouth every 6 (six) hours as needed for nausea or vomiting. 06/18/23  Yes Cyril Mourning A, NP  escitalopram (LEXAPRO) 10 MG tablet Take 1 tablet (10 mg total) by mouth daily. Patient not taking: Reported on 07/13/2023 02/17/22   Donita Brooks, MD  medroxyPROGESTERone (PROVERA) 10 MG tablet Take 1 tablet (10 mg total) by mouth  daily. Patient not taking: Reported on 07/13/2023 06/12/23   Adline Potter, NP  metroNIDAZOLE (FLAGYL) 500 MG tablet Take 1 tablet (500 mg total) by mouth 2 (two) times daily. Patient not taking: Reported on 07/13/2023 06/15/23   Adline Potter, NP                                                                                                                                    Past Surgical History Past Surgical History:  Procedure Laterality Date  CERVICAL CONIZATION W/BX  12/20/2012   Procedure: CONIZATION CERVIX WITH BIOPSY;  Surgeon: Lazaro Arms, MD;  Location: [facility];  Service: Gynecology;  Laterality: N/A;   HOLMIUM LASER APPLICATION  12/20/2012   Procedure: HOLMIUM LASER APPLICATION;  Surgeon: Lazaro Arms, MD;  Location: [facility];  Service: Gynecology;  Laterality: N/A;   Family History Family History  Problem Relation Age of Onset   Hypertension Mother    Hypertension Father    Pancreatic cancer Paternal Uncle    Colon cancer Neg Hx    Esophageal cancer Neg Hx    Inflammatory bowel disease Neg Hx    Liver disease Neg Hx    Rectal cancer Neg Hx    Stomach cancer Neg Hx     Social History Social History   Tobacco Use   Smoking status: Every Day    Current packs/day: 0.50    Average packs/day: 0.5 packs/day for 15.0 years (7.5 ttl pk-yrs)    Types: Cigarettes   Smokeless tobacco: Never  Vaping Use   Vaping status: Never Used  Substance Use Topics   Alcohol use: Yes    Alcohol/week: 28.0 standard drinks of alcohol    Types: 28 Standard drinks or equivalent per week    Comment: 4-6 drinks per day depending what theyre doing.   Drug use: No   Allergies Penicillins  Review of Systems Review of Systems  Constitutional:  Negative for chills and fever.  HENT:  Negative for facial swelling and trouble swallowing.   Eyes:  Negative for photophobia and visual disturbance.  Respiratory:  Negative for cough and shortness of breath.   Cardiovascular:  Negative for  chest pain and palpitations.  Gastrointestinal:  Negative for abdominal pain, nausea and vomiting.  Endocrine: Negative for polydipsia and polyuria.  Genitourinary:  Negative for difficulty urinating and hematuria.  Musculoskeletal:  Negative for gait problem and joint swelling.  Skin:  Negative for pallor and rash.  Neurological:  Positive for syncope. Negative for headaches.  Psychiatric/Behavioral:  Positive for confusion. Negative for agitation.     Physical Exam Vital Signs  I have reviewed the triage vital signs BP (!) 132/96   Pulse (!) 102   Temp 98.1 F (36.7 C)   Resp (!) 23   SpO2 100%  Physical Exam Vitals and nursing note reviewed.  Constitutional:      General: She is not in acute distress.    Appearance: Normal appearance.  HENT:     Head: Normocephalic. No raccoon eyes, Battle's sign, right periorbital erythema or left periorbital erythema.     Jaw: There is normal jaw occlusion.     Comments: Hematoma occiput     Right Ear: External ear normal.     Left Ear: External ear normal.     Nose: Nose normal.     Mouth/Throat:     Mouth: Mucous membranes are moist.  Eyes:     General: No scleral icterus.       Right eye: No discharge.        Left eye: No discharge.     Extraocular Movements: Extraocular movements intact.     Pupils: Pupils are equal, round, and reactive to light.  Cardiovascular:     Rate and Rhythm: Normal rate and regular rhythm.     Pulses: Normal pulses.     Heart sounds: Normal heart sounds.  Pulmonary:     Effort: Pulmonary effort is normal. No respiratory distress.     Breath sounds: Normal  breath sounds. No stridor.  Abdominal:     General: Abdomen is flat. There is no distension.     Palpations: Abdomen is soft.     Tenderness: There is no abdominal tenderness.  Musculoskeletal:     Cervical back: No rigidity.     Right lower leg: No edema.     Left lower leg: No edema.     Comments: No midline spinous process tenderness to  palpation or percussion, no crepitus or step-off.  Rectal tone is intact.   Skin:    General: Skin is warm and dry.     Capillary Refill: Capillary refill takes less than 2 seconds.       Neurological:     Mental Status: She is alert and oriented to person, place, and time.     GCS: GCS eye subscore is 4. GCS verbal subscore is 5. GCS motor subscore is 6.     Cranial Nerves: Cranial nerves 2-12 are intact. No dysarthria or facial asymmetry.     Sensory: Sensation is intact.     Motor: Motor function is intact. No tremor.     Coordination: Coordination is intact.     Comments: Aox3 on arrival GCS 15 Strength symmetric   Psychiatric:        Mood and Affect: Mood normal.        Behavior: Behavior normal. Behavior is cooperative.     ED Results and Treatments Labs (all labs ordered are listed, but only abnormal results are displayed) Labs Reviewed  CBC WITH DIFFERENTIAL/PLATELET - Abnormal; Notable for the following components:      Result Value   RBC 2.80 (*)    Hemoglobin 9.0 (*)    HCT 26.7 (*)    All other components within normal limits  COMPREHENSIVE METABOLIC PANEL - Abnormal; Notable for the following components:   Potassium 2.8 (*)    CO2 20 (*)    Glucose, Bld 124 (*)    BUN 5 (*)    Calcium 7.5 (*)    Albumin 3.3 (*)    AST 131 (*)    ALT 52 (*)    Alkaline Phosphatase 165 (*)    All other components within normal limits  URINALYSIS, ROUTINE W REFLEX MICROSCOPIC - Abnormal; Notable for the following components:   Specific Gravity, Urine 1.003 (*)    All other components within normal limits  I-STAT CG4 LACTIC ACID, ED - Abnormal; Notable for the following components:   Lactic Acid, Venous 4.2 (*)    All other components within normal limits  ETHANOL  RAPID URINE DRUG SCREEN, HOSP PERFORMED  HCG, QUANTITATIVE, PREGNANCY  I-STAT CG4 LACTIC ACID, ED  TROPONIN I (HIGH SENSITIVITY)  TROPONIN I (HIGH SENSITIVITY)                                                                                                                           Radiology CT HEAD WO CONTRAST  Result Date: 07/13/2023 CLINICAL DATA:  Trauma EXAM: CT HEAD WITHOUT CONTRAST CT CERVICAL SPINE WITHOUT CONTRAST TECHNIQUE: Multidetector CT imaging of the head and cervical spine was performed following the standard protocol without intravenous contrast. Multiplanar CT image reconstructions of the cervical spine were also generated. RADIATION DOSE REDUCTION: This exam was performed according to the departmental dose-optimization program which includes automated exposure control, adjustment of the mA and/or kV according to patient size and/or use of iterative reconstruction technique. COMPARISON:  Brain and cervical spine MRI and CT head 02/09/2022 FINDINGS: CT HEAD FINDINGS Brain: There is no acute intracranial hemorrhage, extra-axial fluid collection, or acute infarct. Parenchymal volume is normal. The ventricles are normal in size. -white differentiation is preserved. The pituitary and suprasellar region are normal. There is no mass lesion. There is no mass effect or midline shift. Vascular: No hyperdense vessel or unexpected calcification. Skull: Normal. Negative for fracture or focal lesion. Sinuses/Orbits: The paranasal sinuses are clear. The globes and orbits are unremarkable. Other: The mastoid air cells and middle ear cavities are clear. CT CERVICAL SPINE FINDINGS Alignment: Normal. Skull base and vertebrae: Skull base alignment is maintained. Vertebral body heights are preserved. There is no acute fracture. There is no suspicious osseous lesion. Soft tissues and spinal canal: No prevertebral fluid or swelling. No visible canal hematoma. Disc levels: The disc spaces are preserved. There is no significant spinal canal or neural foraminal stenosis. Upper chest: The imaged lung apices are clear. Other: None. IMPRESSION: 1. No acute intracranial pathology. 2. No acute fracture or traumatic  malalignment of the cervical spine. Electronically Signed   By: Lesia Hausen M.D.   On: 07/13/2023 17:37   CT CERVICAL SPINE WO CONTRAST  Result Date: 07/13/2023 CLINICAL DATA:  Trauma EXAM: CT HEAD WITHOUT CONTRAST CT CERVICAL SPINE WITHOUT CONTRAST TECHNIQUE: Multidetector CT imaging of the head and cervical spine was performed following the standard protocol without intravenous contrast. Multiplanar CT image reconstructions of the cervical spine were also generated. RADIATION DOSE REDUCTION: This exam was performed according to the departmental dose-optimization program which includes automated exposure control, adjustment of the mA and/or kV according to patient size and/or use of iterative reconstruction technique. COMPARISON:  Brain and cervical spine MRI and CT head 02/09/2022 FINDINGS: CT HEAD FINDINGS Brain: There is no acute intracranial hemorrhage, extra-axial fluid collection, or acute infarct. Parenchymal volume is normal. The ventricles are normal in size. -white differentiation is preserved. The pituitary and suprasellar region are normal. There is no mass lesion. There is no mass effect or midline shift. Vascular: No hyperdense vessel or unexpected calcification. Skull: Normal. Negative for fracture or focal lesion. Sinuses/Orbits: The paranasal sinuses are clear. The globes and orbits are unremarkable. Other: The mastoid air cells and middle ear cavities are clear. CT CERVICAL SPINE FINDINGS Alignment: Normal. Skull base and vertebrae: Skull base alignment is maintained. Vertebral body heights are preserved. There is no acute fracture. There is no suspicious osseous lesion. Soft tissues and spinal canal: No prevertebral fluid or swelling. No visible canal hematoma. Disc levels: The disc spaces are preserved. There is no significant spinal canal or neural foraminal stenosis. Upper chest: The imaged lung apices are clear. Other: None. IMPRESSION: 1. No acute intracranial pathology. 2. No acute  fracture or traumatic malalignment of the cervical spine. Electronically Signed   By: Lesia Hausen M.D.   On: 07/13/2023 17:37   DG Chest Port 1 View  Result Date: 07/13/2023 CLINICAL DATA:  Trauma EXAM: PORTABLE CHEST 1 VIEW COMPARISON:  Chest radiograph 08/26/2022 FINDINGS: The cardiomediastinal  silhouette is normal There is no focal consolidation or pulmonary edema. There is no pleural effusion or pneumothorax There is no acute osseous abnormality. IMPRESSION: No radiographic evidence of acute cardiopulmonary process. Electronically Signed   By: Lesia Hausen M.D.   On: 07/13/2023 17:31   DG Pelvis Portable  Result Date: 07/13/2023 CLINICAL DATA:  Trauma/fall EXAM: PORTABLE PELVIS 1-2 VIEWS COMPARISON:  None Available. FINDINGS: No fracture or dislocation is seen. Bilateral hip joint spaces are preserved. Visualized bony pelvis appears intact. IMPRESSION: Negative. Electronically Signed   By: Charline Bills M.D.   On: 07/13/2023 17:28   DG Elbow Complete Right  Result Date: 07/13/2023 CLINICAL DATA:  Status post fall EXAM: RIGHT ELBOW - COMPLETE 4 VIEW COMPARISON:  None Available. FINDINGS: There is no evidence of fracture, dislocation, or joint effusion. There is no evidence of arthropathy or other focal bone abnormality. Soft tissues are unremarkable. IMPRESSION: No acute fracture or dislocation. Electronically Signed   By: Agustin Cree M.D.   On: 07/13/2023 17:28    Pertinent labs & imaging results that were available during my care of the patient were reviewed by me and considered in my medical decision making (see MDM for details).  Medications Ordered in ED Medications  0.9 %  sodium chloride infusion ( Intravenous New Bag/Given 07/13/23 1825)  Tdap (BOOSTRIX) injection 0.5 mL (0.5 mLs Intramuscular Given 07/13/23 1722)  sodium chloride 0.9 % bolus 1,000 mL (0 mLs Intravenous Stopped 07/13/23 1825)  potassium chloride 10 mEq in 100 mL IVPB (0 mEq Intravenous Stopped 07/13/23 2023)                                                                                                                                      Procedures Procedures  (including critical care time)  Medical Decision Making / ED Course    Medical Decision Making:    BETSAIDA BOW is a 42 y.o. female with past medical history as below, significant for etoh abuse (last etoh 2 days ago), GERD, HLD, HSV2, HTN, RA, seizures (last 1999 no AED's) who presents to the ED with complaint of fall/head injury/ syncope. . The complaint involves an extensive differential diagnosis and also carries with it a high risk of complications and morbidity.  Serious etiology was considered. Ddx includes but is not limited to: Differential diagnoses for head trauma includes subdural hematoma, epidural hematoma, acute concussion, traumatic subarachnoid hemorrhage, cerebral contusions, etc.   Complete initial physical exam performed, notably the patient  was NAD, neuro non - focal, .    Reviewed and confirmed nursing documentation for past medical history, family history, social history.  Vital signs reviewed.    Clinical Course as of 07/13/23 2252  Fri Jul 13, 2023  1744 AST(!): 131 [SG]  1744 ALT(!): 52 [SG]  1744 Chronic alcohol abuse [SG]  1916 Hemoglobin(!): 9.0 Similar previously 10 months ago, was 1.5 three months ago; stopped  taking her iron pills, denies any bleeding [SG]  2126 Pt feeling much better, tolerating PO, ambulatory [SG]  2244 Potassium(!): 2.8 Will give replacement here and PO for home, recheck with pcp in 3-5 days [SG]  2252 Tachycardia improved [SG]    Clinical Course User Index [SG] Sloan Leiter, DO    Narrative: 42 yo female here for fall w/ pos syncope Confused after the fall No seizure activity witnessed by family/bystander Back to baseline on arrival Labs as above Imaging stable She is feeling much better Ambulatory w/ steady gait, tolerating PO Does not appear to be in acute etoh withdrawal, no  hallucinations or anxiety, no diaphoresis or nausea. Advised f/u with [facility] for [facility] care   CT without obvious fracture or deformities. Patient has no midline cervical spine tenderness on palpation or with full range of motion. Patient denies radiculopathy-like symptoms and is moving all four extremities freely without any weakness. Patient is not intoxicated and is without altered mental status. Patient was cleared from cervical collar.  Concussion precautions d/w pt and family at bedside   The patient improved significantly and was discharged in stable condition. Detailed discussions were had with the patient regarding current findings, and need for close f/u with PCP or on call doctor. The patient has been instructed to return immediately if the symptoms worsen in any way for re-evaluation. Patient verbalized understanding and is in agreement with current care plan. All questions answered prior to discharge.         Additional history obtained: -Additional history obtained from ems daughter -External records from outside source obtained and reviewed including: Chart review including previous notes, labs, imaging, consultation notes including primary care documentation, prior labs/home meds   Lab Tests: -I ordered, reviewed, and interpreted labs.   The pertinent results include:   Labs Reviewed  CBC WITH DIFFERENTIAL/PLATELET - Abnormal; Notable for the following components:      Result Value   RBC 2.80 (*)    Hemoglobin 9.0 (*)    HCT 26.7 (*)    All other components within normal limits  COMPREHENSIVE METABOLIC PANEL - Abnormal; Notable for the following components:   Potassium 2.8 (*)    CO2 20 (*)    Glucose, Bld 124 (*)    BUN 5 (*)    Calcium 7.5 (*)    Albumin 3.3 (*)    AST 131 (*)    ALT 52 (*)    Alkaline Phosphatase 165 (*)    All other components within normal limits  URINALYSIS, ROUTINE W REFLEX MICROSCOPIC - Abnormal; Notable for the following  components:   Specific Gravity, Urine 1.003 (*)    All other components within normal limits  I-STAT CG4 LACTIC ACID, ED - Abnormal; Notable for the following components:   Lactic Acid, Venous 4.2 (*)    All other components within normal limits  ETHANOL  RAPID URINE DRUG SCREEN, HOSP PERFORMED  HCG, QUANTITATIVE, PREGNANCY  I-STAT CG4 LACTIC ACID, ED  TROPONIN I (HIGH SENSITIVITY)  TROPONIN I (HIGH SENSITIVITY)    Notable for as above  EKG   EKG Interpretation Date/Time:  Friday July 13 2023 22:50:32 EDT Ventricular Rate:  94 PR Interval:  157 QRS Duration:  84 QT Interval:  360 QTC Calculation: 451 R Axis:   30  Text Interpretation: Sinus rhythm Abnormal R-wave progression, early transition similar to prior no stemi Confirmed by Tanda Rockers ([id number]) on 07/13/2023 10:52:28 PM  Imaging Studies ordered: I ordered imaging studies including CTH CT c/s pelvis xr chest xr elbow xr I independently visualized the following imaging with scope of interpretation limited to determining acute life threatening conditions related to emergency care; findings noted above, significant for stable imaging  I independently visualized and interpreted imaging. I agree with the radiologist interpretation   Medicines ordered and prescription drug management: Meds ordered this encounter  Medications   Tdap (BOOSTRIX) injection 0.5 mL   sodium chloride 0.9 % bolus 1,000 mL   potassium chloride 10 mEq in 100 mL IVPB   0.9 %  sodium chloride infusion   potassium chloride SA (KLOR-CON M) 20 MEQ tablet    Sig: Take 1 tablet (20 mEq total) by mouth daily for 3 days.    Dispense:  3 tablet    Refill:  0   acetaminophen (TYLENOL) 325 MG tablet    Sig: Take 2 tablets (650 mg total) by mouth every 6 (six) hours as needed.    Dispense:  36 tablet    Refill:  0    -I have reviewed the patients home medicines and have made adjustments as needed   Consultations Obtained: na   Cardiac  Monitoring: The patient was maintained on a cardiac monitor.  I personally viewed and interpreted the cardiac monitored which showed an underlying rhythm of: NSR  Social Determinants of Health:  Diagnosis or treatment significantly limited by social determinants of health: current smoker and alcohol use Counseled patient for approximately 3 minutes regarding smoking cessation. Discussed risks of smoking and how they applied and affected their visit here today. Patient not ready to quit at this time, however will follow up with their primary doctor when they are.   CPT code: 16109: intermediate counseling for smoking cessation  Etoh cessation encouraged   Reevaluation: After the interventions noted above, I reevaluated the patient and found that they have resolved  Co morbidities that complicate the patient evaluation  Past Medical History:  Diagnosis Date   Abnormal Pap smear    Alcohol use    Allergic rhinitis    BV (bacterial vaginosis)    GERD (gastroesophageal reflux disease)    Hepatitis    LFT's >1000 (2019) due to methotrexate vs alcohol   High cholesterol    HSV-2 (herpes simplex virus 2) infection    Hypertension    Hypothyroidism 10/17/2019   Pancreatitis    12/19 (thought to be due to alcohol)   Rheumatoid arthritis (HCC)    Seizures (HCC)    dx as child with epilepsy but no seizures since 89 and on no meds   Vaginal Pap smear, abnormal       Dispostion: Disposition decision including need for hospitalization was considered, and patient discharged from emergency department.    Final Clinical Impression(s) / ED Diagnoses Final diagnoses:  Fall, initial encounter  Injury of head, initial encounter  Alcohol abuse  Hypokalemia  Anemia, unspecified type        Sloan Leiter, DO 07/13/23 2252

## 2023-07-13 NOTE — ED Notes (Signed)
Dr Wallace Cullens stated that the c-collar could be moved and that the Pt was able to attempt ambulating.  Pt wanted to just sit up in bed for the time being.

## 2023-07-13 NOTE — ED Notes (Signed)
Pt is in C collar

## 2023-07-13 NOTE — ED Triage Notes (Signed)
Pt coming in for fall from standing while transporting groceries into the house with her family today, she can recall all the events leading up to the incident. She mentions " I remember taking groceries in and the next thing I know I was here.".  pt has pain complaints while resting but does have pain when being transferred from the beds. Pupils equal and reactive at a pupil size of 4, can move all extremities with no noticeable deformities, no numbness or loss of sensation in extremities.

## 2023-07-13 NOTE — Progress Notes (Signed)
   07/13/23 1600  Spiritual Encounters  Type of Visit Initial  Care provided to: Patient  Reason for visit Trauma  OnCall Visit No  Spiritual Framework  Patient Stress Factors None identified  Family Stress Factors None identified  Interventions  Spiritual Care Interventions Made Compassionate presence   I was present  Chaplain Intern: Paul Dykes. Christell Constant

## 2023-07-13 NOTE — Progress Notes (Signed)
Orthopedic Tech Progress Note Patient Details:  Samantha Rosales 1981/06/28 [id number]  Patient ID: Samantha Rosales, female   DOB: Jul 16, 1981, 42 y.o.   MRN: [id number] Level II; not currently needed. Darleen Crocker 07/13/2023, 5:10 PM

## 2023-07-13 NOTE — Discharge Instructions (Addendum)
Please consider alcohol cessation Please stop smoking Please follow up with resources listed below for outpatient help in regards to alcohol abuse Please follow up with your OBGYN  Please resume taking your iron tablets and follow up with your PCP in the next 3-5 days for recheck of your labs   Based on the events which brought you to the ER today, it is possible that you may have a concussion. A concussion occurs when there is a blow to the head or body, with enough force to shake the brain and disrupt how the brain functions. You may experience symptoms such as headaches, sensitivity to light/noise, dizziness, cognitive slowing, difficulty concentrating / remembering, trouble sleeping and drowsiness. These symptoms may last anywhere from hours/days to potentially weeks/months. While these symptoms are very frustrating and perhaps debilitating, it is important that you remember that they will improve over time. Everyone has a different rate of recovery; it is difficult to predict when your symptoms will resolve. In order to allow for your brain to heal after the injury, we recommend that you see your primary physician or a physician knowledgeable in concussion management. We also advise you to let your body and brain rest: avoid physical activities (sports, gym, and exercise) and reduce cognitive demands (reading, texting, TV watching, computer use, video games, etc). School attendance, after-school activities and work may need to be modified to avoid increasing symptoms. We recommend against driving until until all symptoms have resolved. Come back to the ER right away if you are having repeated episodes of vomiting, severe/worsening headache/dizziness or any other symptom that alarms you. We recommended that someone stay with you for the next 24 hours to monitor for these worrisome symptoms.    It was a pleasure caring for you today in the emergency department.  Please return to the emergency  department for any worsening or worrisome symptoms.

## 2023-07-13 NOTE — ED Notes (Signed)
Assisted pt with bedpan

## 2023-07-18 ENCOUNTER — Telehealth: Payer: Self-pay | Admitting: Adult Health

## 2023-07-18 NOTE — Telephone Encounter (Signed)
Pt took all of Provera and just had cramping but no period. Please advise. Thanks! JSY

## 2023-07-18 NOTE — Telephone Encounter (Signed)
Last dose was July 19. Samantha Rosales

## 2023-07-18 NOTE — Telephone Encounter (Signed)
Pt states she is only cramping on the current medication. Her cycle has not started. Please advise

## 2023-07-19 MED ORDER — MEDROXYPROGESTERONE ACETATE 10 MG PO TABS: ORAL_TABLET | ORAL | 0 refills | Status: DC

## 2023-07-19 NOTE — Addendum Note (Signed)
Addended by: Cyril Mourning A on: 07/19/2023 01:48 PM   Modules accepted: Orders

## 2023-07-19 NOTE — Telephone Encounter (Signed)
Did not bleed after taking 10 mg of provera for 10 days, will try 20 mg for 10 days and reschedule appt to 08/13/23

## 2023-07-23 ENCOUNTER — Ambulatory Visit: Payer: BC Managed Care – PPO | Admitting: Adult Health

## 2023-07-31 ENCOUNTER — Telehealth: Payer: Self-pay | Admitting: Adult Health

## 2023-07-31 NOTE — Telephone Encounter (Signed)
Finished Provera this morning and hasn't started a period. Pt is worried that she hasn't started. Please advise. Thanks! JSY

## 2023-07-31 NOTE — Telephone Encounter (Signed)
Pt states she still has not had a cycle after finishing the medication. Please advise

## 2023-08-01 NOTE — Telephone Encounter (Signed)
Pt aware it could be 3-7 days before she sees any blood. Pt voiced understanding. JSY

## 2023-08-23 ENCOUNTER — Ambulatory Visit: Payer: BC Managed Care – PPO | Admitting: Adult Health

## 2023-09-03 ENCOUNTER — Ambulatory Visit (INDEPENDENT_AMBULATORY_CARE_PROVIDER_SITE_OTHER): Payer: BC Managed Care – PPO | Admitting: Adult Health

## 2023-09-03 ENCOUNTER — Encounter: Payer: Self-pay | Admitting: Adult Health

## 2023-09-03 VITALS — BP 136/87 | HR 107 | Ht 68.0 in | Wt 122.0 lb

## 2023-09-03 DIAGNOSIS — N912 Amenorrhea, unspecified: Secondary | ICD-10-CM

## 2023-09-03 DIAGNOSIS — Z3202 Encounter for pregnancy test, result negative: Secondary | ICD-10-CM

## 2023-09-03 DIAGNOSIS — N911 Secondary amenorrhea: Secondary | ICD-10-CM | POA: Diagnosis not present

## 2023-09-03 DIAGNOSIS — I10 Essential (primary) hypertension: Secondary | ICD-10-CM

## 2023-09-03 DIAGNOSIS — Z1231 Encounter for screening mammogram for malignant neoplasm of breast: Secondary | ICD-10-CM

## 2023-09-03 LAB — POCT URINE PREGNANCY: Preg Test, Ur: NEGATIVE

## 2023-09-03 NOTE — Progress Notes (Signed)
Subjective:     Patient ID: Samantha Rosales, female   DOB: [birth year], 42 y.o.   MRN: [id number]  HPI Samantha Rosales is a 42 year old black female, married, [id number] in for follow up on taking provera 20 mg x 10 days and did not having any bleeding but had cramping. She tried provera 10 mg x 10 days before that. She stopped depo in 2022 and has not had period in years. FSH,TSH and prolactin all within normal range.     Component Value Date/Time   DIAGPAP - Low grade squamous intraepithelial lesion (LSIL) (A) 06/12/2023 1157   DIAGPAP  06/26/2017 0000    NEGATIVE FOR INTRAEPITHELIAL LESIONS OR MALIGNANCY.   DIAGPAP  06/26/2017 0000    FUNGAL ORGANISMS PRESENT CONSISTENT WITH CANDIDA SPP.   HPVHIGH Negative 06/12/2023 1157   ADEQPAP  06/12/2023 1157    Satisfactory for evaluation; transformation zone component ABSENT.   ADEQPAP  06/26/2017 0000    Satisfactory for evaluation  endocervical/transformation zone component ABSENT.    PCP is I Polanco NP  Review of Systems No bleeding after provera  No period in years She has gained some weight  Reviewed past medical,surgical, social and family history. Reviewed medications and allergies.     Objective:   Physical Exam BP 136/87 (BP Location: Right Arm, Patient Position: Sitting, Cuff Size: Normal)   Pulse (!) 107   Ht 5\' 8"  (1.727 m)   Wt 122 lb (55.3 kg)   LMP  (LMP Unknown)   BMI 18.55 kg/m     UPT is negative Skin warm and dry. Lungs: + wheezing bilaterally,did clear some with cough.  Cardiovascular: regular rate and rhythm.  Fall risk is moderate  Upstream - 09/03/23 1120       Pregnancy Intention Screening   Does the patient want to become pregnant in the next year? No    Would the patient like to discuss contraceptive options today? No      Contraception Wrap Up   Current Method Female Condom    End Method Female Condom             Assessment:     1. Negative pregnancy test - POCT urine pregnancy  2. Amenorrhea -  POCT urine pregnancy  3. Amenorrhea, secondary No period in years Will get [place] in about 8 days in office to assess uterus and ovaries - US PELVIC COMPLETE WITH TRANSVAGINAL; Future  4. Primary hypertension Take BP meds and follow up with PCP  5. Screening mammogram for breast cancer Mammogram scheduled for her 09/10/23 at 7 am at [facility] - MM 3D SCREENING MAMMOGRAM BILATERAL BREAST; Future     Plan:     Return 09/11/23 for pelvic US in office, will talk when results back

## 2023-09-10 ENCOUNTER — Ambulatory Visit (HOSPITAL_COMMUNITY)
Admission: RE | Admit: 2023-09-10 | Discharge: 2023-09-10 | Disposition: A | Discharge status: Home / Self Care | Payer: Medicare Other | Source: Ambulatory Visit | Attending: Adult Health | Admitting: Adult Health

## 2023-09-10 ENCOUNTER — Encounter (HOSPITAL_COMMUNITY): Payer: Self-pay | Admitting: Radiology

## 2023-09-10 DIAGNOSIS — Z1231 Encounter for screening mammogram for malignant neoplasm of breast: Secondary | ICD-10-CM | POA: Diagnosis present

## 2023-09-11 ENCOUNTER — Other Ambulatory Visit: Payer: Medicare Other | Admitting: Radiology

## 2023-09-11 DIAGNOSIS — N911 Secondary amenorrhea: Secondary | ICD-10-CM

## 2023-09-11 NOTE — Progress Notes (Signed)
Hx cervical Leep  Now with amenorrhea  [place] today shows normal uterine size.  Homogeneous myometrium Large amount of blood within the em cavity Hematometra likely cervical stenosis   em thickness = 1.9 mm Avascular cervical canal, no evidence of cervical polyp  no intracavitary mass apparent Normal ovaries bilaterally - neg Adnexal regions  neg CDS - small amount of clear free fluid present

## 2023-09-13 ENCOUNTER — Telehealth: Payer: Self-pay | Admitting: Adult Health

## 2023-09-13 NOTE — Telephone Encounter (Signed)
Left message to call me about US °

## 2023-09-13 NOTE — Telephone Encounter (Signed)
Pt aware that US showed cervical stenosis, and hematometra,normal ovaries, will get appt with Dr Charlotta Newton to see if can open cervix to let blood out.

## 2023-09-26 ENCOUNTER — Encounter: Payer: Self-pay | Admitting: Obstetrics & Gynecology

## 2023-09-26 ENCOUNTER — Ambulatory Visit: Payer: Medicare Other | Admitting: Obstetrics & Gynecology

## 2023-09-26 VITALS — BP 120/83 | HR 103 | Ht 68.0 in | Wt 103.2 lb

## 2023-09-26 DIAGNOSIS — N912 Amenorrhea, unspecified: Secondary | ICD-10-CM

## 2023-09-26 DIAGNOSIS — N857 Hematometra: Secondary | ICD-10-CM | POA: Diagnosis not present

## 2023-09-26 DIAGNOSIS — N946 Dysmenorrhea, unspecified: Secondary | ICD-10-CM

## 2023-09-26 MED ORDER — IBUPROFEN 600 MG PO TABS: 600.0000 mg | ORAL_TABLET | Freq: Four times a day (QID) | ORAL | 1 refills | Status: DC | PRN

## 2023-09-26 MED ORDER — NORETHINDRONE ACETATE 5 MG PO TABS: 5.0000 mg | ORAL_TABLET | Freq: Every day | ORAL | 4 refills | Status: DC

## 2023-09-26 NOTE — Progress Notes (Signed)
GYN VISIT Patient name: Samantha Rosales MRN [id number]  Date of birth: 1981-08-14 Chief Complaint:   Gynecologic Exam ("I'm here for a precedure on my cervix.")  History of Present Illness:   Samantha Rosales is a 42 y.o. [phone] female being seen today for the following concerns:  Pt notes h/o cervical conization in 2014.  Previously on Depot and did not have a period, stopped in 2022.  She was seen in July due to secondary amenorrhea- work up included labs as well as pelvic US.  Findings suggest- hematometra (see below).  Over the past several months she has noted intermittent cramping that seems to occur monthly.  Rates her pain as 5/10.  Reports no other acute complaints.  No LMP recorded (lmp unknown). Patient is postmenopausal.  [place] completed:  6.8 x 5.2 x 6.7cm, Total uterine volume 125 cc   homogeneous myometrium   Em thickness = 1.9 mm, symmetrical, hematometra, large amount of homogeneous low level fluid within the cavity, likely cervical stenosis.  No evidnece of intracavitary mass or cervical mass  Review of Systems:   Pertinent items are noted in HPI Denies fever/chills, dizziness, headaches, visual disturbances, fatigue, shortness of breath, chest pain, abdominal pain, vomiting, no problems with periods, bowel movements, urination, or intercourse unless otherwise stated above.  Pertinent History Reviewed:   Past Surgical History:  Procedure Laterality Date   CERVICAL CONIZATION W/BX  12/20/2012   Procedure: CONIZATION CERVIX WITH BIOPSY;  Surgeon: Lazaro Arms, MD;  Location: [facility];  Service: Gynecology;  Laterality: N/A;   HOLMIUM LASER APPLICATION  12/20/2012   Procedure: HOLMIUM LASER APPLICATION;  Surgeon: Lazaro Arms, MD;  Location: [facility];  Service: Gynecology;  Laterality: N/A;    Past Medical History:  Diagnosis Date   Abnormal Pap smear    Alcohol use    Allergic rhinitis    BV (bacterial vaginosis)    Closed left ankle fracture    GERD (gastroesophageal  reflux disease)    Hepatitis    LFT's >1000 (2019) due to methotrexate vs alcohol   High cholesterol    HSV-2 (herpes simplex virus 2) infection    Hypertension    Hypothyroidism 10/17/2019   Pancreatitis    12/19 (thought to be due to alcohol)   Rheumatoid arthritis (HCC)    Seizures (HCC)    dx as child with epilepsy but no seizures since 89 and on no meds   Vaginal Pap smear, abnormal    Reviewed problem list, medications and allergies. Physical Assessment:   Vitals:   09/26/23 1010  BP: 120/83  Pulse: (!) 103  Weight: 103 lb 3.2 oz (46.8 kg)  Height: 5\' 8"  (1.727 m)  Body mass index is 15.69 kg/m.       Physical Examination:   General appearance: alert, well appearing, and in no distress  Psych: mood appropriate, normal affect  Skin: warm & dry   Cardiovascular: normal heart rate noted  Respiratory: normal respiratory effort, no distress  Abdomen: soft, non-tender   Pelvic: VULVA: normal appearing vulva with no masses, tenderness or lesions, VAGINA: normal appearing vagina with normal color and discharge, no lesions, CERVIX: stenotic cervix noted.  Difficulty even with grasping anterior portion of cervix using tenaculum.  Using both os finder and small dilators- attempts were made to dilate/open cervix without success.  Pt noted considerable discomfort throughout the procedure.  Extremities: no edema   Chaperone:  Drue Second, MS3     Assessment & Plan:  1) Hematometra, Amenorrhea, Dysmenorrhea -unable to completed cervical dilation -discussed options including surgical intervention: exam under anesthesia or possible hysterectomy.  Reviewed risk/benefit including that EUA may not be successful -discussed conservative monitoring of symptoms and plan to start daily progesterone to prevent ovulation/menses -also considered repeat exam in office with valium/cytotec  -f/u in 3 mos  Meds ordered this encounter  Medications   ibuprofen (ADVIL) 600 MG tablet    Sig:  Take 1 tablet (600 mg total) by mouth every 6 (six) hours as needed. Take medication with food to prevent upset stomach    Dispense:  30 tablet    Refill:  1   norethindrone (AYGESTIN) 5 MG tablet    Sig: Take 1 tablet (5 mg total) by mouth daily.    Dispense:  90 tablet    Refill:  4     Return in about 3 months (around 12/27/2023) for Medication follow up (Dorthy Magnussen or eure).   Myna Hidalgo, DO Attending Obstetrician & Gynecologist, [facility] for [employer], [facility]

## 2023-10-01 ENCOUNTER — Telehealth: Payer: Self-pay | Admitting: Family Medicine

## 2023-10-01 NOTE — Telephone Encounter (Signed)
Thank you :)

## 2023-10-01 NOTE — Telephone Encounter (Signed)
Informed pt. That I do not see any DM DX in her chart and that the Diabetic mail is probably spam mail.

## 2023-10-01 NOTE — Telephone Encounter (Signed)
Patient called to let provider know she is getting a lot of diabetic mail in her mail at home question is she diabetic? Call back # [phone].

## 2023-10-02 ENCOUNTER — Telehealth: Payer: Self-pay | Admitting: Family Medicine

## 2023-10-02 ENCOUNTER — Telehealth: Payer: Self-pay | Admitting: Obstetrics & Gynecology

## 2023-10-02 NOTE — Telephone Encounter (Signed)
error 

## 2023-10-02 NOTE — Telephone Encounter (Signed)
Patient thinks that the new medicine she got put on has caused a yeast infection.She would like something called into [facility]. Please advise.

## 2023-10-03 ENCOUNTER — Telehealth: Payer: Self-pay

## 2023-10-03 NOTE — Telephone Encounter (Signed)
Patient thinks that the new medicine she got put on has caused a yeast infection.She would like something called into [facility]. Patient would like for someone to give her a call.

## 2023-10-05 NOTE — Telephone Encounter (Signed)
Telephone call to patient in regard to her message. Left patient message to call back the office.

## 2023-10-05 NOTE — Telephone Encounter (Signed)
Patient called stating that she has had vaginal itching since she picked up her prescription for ayegestin on 09/28/23. She denies having any discharge or odor. Patient states the itching is better today. Reviewed with Dr. Charlotta Newton. Patient instructed to come in for a self swab. Patient states since she feels better now she will wait until Monday November 4th and then will set up an appointment if she still has symptoms.

## 2023-10-15 ENCOUNTER — Telehealth: Payer: Medicare Other | Admitting: Emergency Medicine

## 2023-10-15 ENCOUNTER — Ambulatory Visit: Payer: Self-pay | Admitting: Family Medicine

## 2023-10-15 ENCOUNTER — Telehealth: Payer: Self-pay

## 2023-10-15 DIAGNOSIS — G8929 Other chronic pain: Secondary | ICD-10-CM | POA: Diagnosis not present

## 2023-10-15 DIAGNOSIS — R109 Unspecified abdominal pain: Secondary | ICD-10-CM

## 2023-10-15 NOTE — Telephone Encounter (Signed)
Copied from CRM [id number]. Topic: Clinical - Medical Advice >> Oct 15, 2023 11:10 AM Maxwell Marion wrote: Reason for CRM: Patient is experiencing really bad cramps, would like a virtual apt scheduled to get medicine called in   Chief Complaint: Lower abdominal cramp Symptoms: i Frequency: Intermittent pain Pertinent Negatives: Patient denies fevers or nausea or vomiting Disposition: [] ED /[] Urgent Care (no appt availability in office) / [x] Appointment(In office/virtual)/ []  [place] [facility]/ [] Home Care/ [] Refused Recommended Disposition /[] [place] [facility]/ []  Follow-up with PCP Additional Notes: Reports pain started after starting new medication   Reason for Disposition  [1] MILD-MODERATE pain AND [2] constant AND [3] present > 2 hours  Answer Assessment - Initial Assessment Questions 1. LOCATION: "Where does it hurt?"      Lower abdomen 2. RADIATION: "Does the pain shoot anywhere else?" (e.g., chest, back)     No 3. ONSET: "When did the pain begin?" (e.g., minutes, hours or days ago)      Been going on for a while, worsening over the past week 4. SUDDEN: "Gradual or sudden onset?"     Gradual 5. PATTERN "Does the pain come and go, or is it constant?"    - If it comes and goes: "How long does it last?" "Do you have pain now?"     (Note: Comes and goes means the pain is intermittent. It goes away completely between bouts.)    - If constant: "Is it getting better, staying the same, or getting worse?"      (Note: Constant means the pain never goes away completely; most serious pain is constant and gets worse.)      Comes and go, pain stays for a while 6. SEVERITY: "How bad is the pain?"  (e.g., Scale 1-10; mild, moderate, or severe)    - MILD (1-3): Doesn't interfere with normal activities, abdomen soft and not tender to touch.     - MODERATE (4-7): Interferes with normal activities or awakens from sleep, abdomen tender to touch.     - SEVERE (8-10): Excruciating pain, doubled  over, unable to do any normal activities.       10 7. RECURRENT SYMPTOM: "Have you ever had this type of stomach pain before?" If Yes, ask: "When was the last time?" and "What happened that time?"      No 8. CAUSE: "What do you think is causing the stomach pain?"     New medication that is supposed to open cervix 9. RELIEVING/AGGRAVATING FACTORS: "What makes it better or worse?" (e.g., antacids, bending or twisting motion, bowel movement)     Nothing is helping, Ibuprofen was helping before but not anymore 10. OTHER SYMPTOMS: "Do you have any other symptoms?" (e.g., back pain, diarrhea, fever, urination pain, vomiting)       No 11. PREGNANCY: "Is there any chance you are pregnant?" "When was your last menstrual period?"       No; last cycle was about 6 years ago  Protocols used: Abdominal Pain - [facility]

## 2023-10-15 NOTE — Telephone Encounter (Signed)
Spoke with patient. Scheduled visit with Dr. Charlotta Newton on Wednesday to discuss options.

## 2023-10-15 NOTE — Telephone Encounter (Signed)
PATIENT WOULD LIKE FOR A NURSE TO CALL HER. SHE IS HAVING PELVIC PAIN.

## 2023-10-15 NOTE — Progress Notes (Signed)
Virtual Visit Consent   Samantha Rosales, you are scheduled for a virtual visit with a [facility] provider today. Just as with appointments in the office, your consent must be obtained to participate. Your consent will be active for this visit and any virtual visit you may have with one of our providers in the next 365 days. If you have a MyChart account, a copy of this consent can be sent to you electronically.  As this is a virtual visit, video technology does not allow for your provider to perform a traditional examination. This may limit your provider's ability to fully assess your condition. If your provider identifies any concerns that need to be evaluated in person or the need to arrange testing (such as labs, EKG, etc.), we will make arrangements to do so. Although advances in technology are sophisticated, we cannot ensure that it will always work on either your end or our end. If the connection with a video visit is poor, the visit may have to be switched to a telephone visit. With either a video or telephone visit, we are not always able to ensure that we have a secure connection.  By engaging in this virtual visit, you consent to the provision of healthcare and authorize for your insurance to be billed (if applicable) for the services provided during this visit. Depending on your insurance coverage, you may receive a charge related to this service.  I need to obtain your verbal consent now. Are you willing to proceed with your visit today? Samantha Rosales has provided verbal consent on 10/15/2023 for a virtual visit (video or telephone). Samantha Parsons, NP  Date: 10/15/2023 12:13 PM  Virtual Visit via Video Note   I, Samantha Rosales, connected with  Samantha Rosales  ([id number], 07-20-1981) on 10/15/23 at 12:00 PM EST by a video-enabled telemedicine application and verified that I am speaking with the correct person using two identifiers.  Location: Patient: Virtual Visit Location  Patient: Home Provider: Virtual Visit Location Provider: Home Office   I discussed the limitations of evaluation and management by telemedicine and the availability of in person appointments. The patient expressed understanding and agreed to proceed.    History of Present Illness: Samantha Rosales is a 42 y.o. who identifies as a female who was assigned female at birth, and is being seen today for chronic abd pain that is worsening. Has been have abd cramping for months. Has not had a period in years. Has had negative pregnancy tests, urine test, and pelvic US. Saw Gyn 09/26/23 and attempt to dilate cervix was too painful to continue. Gyn notes EUA may not be possible and hysterecotmy a possibility. Pt takes daily progesterone.   Chronic cramping pain is more severe in the last week and ibuprofen is not helping.   HPI: HPI  Problems:  Patient Active Problem List   Diagnosis Date Noted   Amenorrhea 09/03/2023   Negative pregnancy test 09/03/2023   Screening mammogram for breast cancer 09/03/2023   LGSIL on Pap smear of cervix 06/15/2023   Encounter for screening fecal occult blood testing 06/12/2023   Amenorrhea, secondary 06/12/2023   Epistaxis 06/04/2023   Hot flashes 04/12/2023   Insomnia 03/06/2023   Alcohol use disorder, severe, dependence (HCC) 09/20/2022   Hyperphosphatemia 08/26/2022   Thrombocytopenia (HCC) 08/26/2022   AKI (acute kidney injury) (HCC) 08/26/2022   Abdominal pain 08/26/2022   Nausea and vomiting 08/26/2022   Protein-calorie malnutrition, severe 06/21/2022   Metabolic acidosis  06/20/2022   Alcoholic ketoacidosis 06/20/2022   Hypocalcemia 02/11/2022   MDD (major depressive disorder), recurrent episode, mild (HCC) 02/11/2022   Generalized weakness 02/10/2022   Anemia, chronic disease 02/10/2022   Hypophosphatemia 02/10/2022   Dysphagia 02/10/2022   Chronic fatigue syndrome 09/21/2021   Polyarthralgia 09/21/2021   Closed displaced spiral fracture of shaft  of left humerus 09/14/2021   Hepatic steatosis 08/19/2021   Bilateral optic neuropathy 03/22/2021   Retinal edema 03/22/2021   Abnormal auditory perception of both ears 03/21/2021   Pancreatitis    Hypertensive urgency 10/17/2019   Hypokalemia 10/17/2019   Hypomagnesemia 10/17/2019   Alcohol abuse 10/17/2019   Tobacco abuse 10/17/2019   Alcohol withdrawal (HCC) 10/17/2019   Lactic acidosis 10/17/2019   Confusion    Abnormal LFTs 09/05/2018   Elevated lipase 09/05/2018   History of methotrexate therapy 09/05/2018   High triglycerides 09/05/2018   Elevated liver enzymes 07/01/2018   Primary hypertension 07/01/2018   Encounter for well woman exam with routine gynecological exam 07/01/2018   History of abnormal cervical Pap smear 06/26/2017   Encounter for surveillance of injectable contraceptive 06/26/2017   Encounter for gynecological examination with Papanicolaou smear of cervix 06/26/2017   High risk medication use 06/14/2015   Rheumatoid arthritis involving multiple sites with positive rheumatoid factor (HCC) 02/24/2015   Arthritis, rheumatic, acute or subacute 11/18/2013   Urticaria, idiopathic 08/24/2013   HSV-2 (herpes simplex virus 2) infection 05/29/2013   HPV (human papilloma virus) infection 05/29/2013    Allergies:  Allergies  Allergen Reactions   Penicillins Hives    Has patient had a PCN reaction causing immediate rash, facial/tongue/throat swelling, SOB or lightheadedness with hypotension: Unknown Has patient had a PCN reaction causing severe rash involving mucus membranes or skin necrosis: Unknown Has patient had a PCN reaction that required hospitalization: Unknown Has patient had a PCN reaction occurring within the last 10 years: No If all of the above answers are "NO", then may proceed with Cephalosporin use.    Medications:  Current Outpatient Medications:    acetaminophen (TYLENOL) 325 MG tablet, Take 2 tablets (650 mg total) by mouth every 6 (six) hours  as needed. (Patient not taking: Reported on 09/03/2023), Disp: 36 tablet, Rfl: 0   CALCIUM PO, Take 1 tablet by mouth daily., Disp: , Rfl:    Cholecalciferol (VITAMIN D3 PO), Take 1 tablet by mouth daily., Disp: , Rfl:    escitalopram (LEXAPRO) 10 MG tablet, Take 1 tablet (10 mg total) by mouth daily., Disp: 90 tablet, Rfl: 3   fluticasone (FLONASE) 50 MCG/ACT nasal spray, Place 2 sprays into both nostrils as needed for allergies or rhinitis., Disp: , Rfl:    gabapentin (NEURONTIN) 300 MG capsule, Take 300 mg by mouth daily as needed., Disp: , Rfl:    hydrOXYzine (VISTARIL) 50 MG capsule, Take 1 capsule (50 mg total) by mouth 3 (three) times daily as needed. (Patient taking differently: Take 50 mg by mouth 3 (three) times daily as needed for itching or anxiety.), Disp: 30 capsule, Rfl: 3   ibuprofen (ADVIL) 600 MG tablet, Take 1 tablet (600 mg total) by mouth every 6 (six) hours as needed. Take medication with food to prevent upset stomach, Disp: 30 tablet, Rfl: 1   losartan (COZAAR) 25 MG tablet, TAKE 1 TABLET (25 MG TOTAL) BY MOUTH DAILY., Disp: 90 tablet, Rfl: 1   MAGNESIUM PO, Take 1 tablet by mouth daily., Disp: , Rfl:    Multiple Vitamin (MULTIVITAMIN WITH MINERALS) TABS tablet, Take  1 tablet by mouth daily., Disp: , Rfl:    Multiple Vitamins-Minerals (ZINC PO), Take 1 tablet by mouth daily., Disp: , Rfl:    norethindrone (AYGESTIN) 5 MG tablet, Take 1 tablet (5 mg total) by mouth daily., Disp: 90 tablet, Rfl: 4   potassium chloride SA (KLOR-CON M) 20 MEQ tablet, Take 1 tablet (20 mEq total) by mouth daily for 3 days., Disp: 3 tablet, Rfl: 0   promethazine (PHENERGAN) 25 MG tablet, Take 1 tablet (25 mg total) by mouth every 6 (six) hours as needed for nausea or vomiting. (Patient not taking: Reported on 09/26/2023), Disp: 30 tablet, Rfl: 1  Observations/Objective: Patient is well-developed, well-nourished in no acute distress.  Resting comfortably  at home.  Head is normocephalic, atraumatic.   No labored breathing.  Speech is clear and coherent with logical content.  Patient is alert and oriented at baseline.    Assessment and Plan: 1. Chronic abdominal pain  I am not able to help pt with this problem by video. Recommended she call her Gyn office and discuss options. If pain too severe, seek in person care such as urgent care or ER  Follow Up Instructions: I discussed the assessment and treatment plan with the patient. The patient was provided an opportunity to ask questions and all were answered. The patient agreed with the plan and demonstrated an understanding of the instructions.  A copy of instructions were sent to the patient via MyChart unless otherwise noted below.  The patient was advised to call back or seek an in-person evaluation if the symptoms worsen or if the condition fails to improve as anticipated.    Samantha Parsons, NP

## 2023-10-17 ENCOUNTER — Ambulatory Visit (INDEPENDENT_AMBULATORY_CARE_PROVIDER_SITE_OTHER): Payer: Medicare Other | Admitting: Obstetrics & Gynecology

## 2023-10-17 ENCOUNTER — Encounter: Payer: Self-pay | Admitting: Obstetrics & Gynecology

## 2023-10-17 VITALS — BP 143/89 | HR 92 | Ht 68.0 in | Wt 117.2 lb

## 2023-10-17 DIAGNOSIS — N857 Hematometra: Secondary | ICD-10-CM | POA: Diagnosis not present

## 2023-10-17 DIAGNOSIS — N882 Stricture and stenosis of cervix uteri: Secondary | ICD-10-CM

## 2023-10-17 DIAGNOSIS — R102 Pelvic and perineal pain: Secondary | ICD-10-CM

## 2023-10-17 DIAGNOSIS — N946 Dysmenorrhea, unspecified: Secondary | ICD-10-CM

## 2023-10-17 MED ORDER — MISOPROSTOL 200 MCG PO TABS: ORAL_TABLET | ORAL | 0 refills | Status: DC

## 2023-10-17 MED ORDER — DIAZEPAM 5 MG PO TABS: ORAL_TABLET | ORAL | 0 refills | Status: DC

## 2023-10-17 MED ORDER — IBUPROFEN 600 MG PO TABS
600.0000 mg | ORAL_TABLET | Freq: Four times a day (QID) | ORAL | 1 refills | Status: DC | PRN
Start: 2023-10-17 — End: 2024-02-29

## 2023-10-17 NOTE — Progress Notes (Signed)
GYN VISIT Patient name: Samantha Rosales MRN [id number]  Date of birth: 1981/08/30 Chief Complaint:   Follow-up (On pelvic pain; pain is worse)  History of Present Illness:   Samantha Rosales is a 42 y.o. [phone] female being seen today for the following concerns:  Pelvic pain:  Last seen 10/23 and states the problem has become worse.  Now pain is daily, states is a dull cramp and then at times may be 9/10.  No improvement with POPs.  To review, concern for hematometra 2/2 scarring from conization.  Previously on Depot for man years, but stopped.  She is currently taking ibuprofen with only some improvement of her pain.  In review, concern for hematometra based on [place] completed. [place] completed 09/2023:  6.8 x 5.2 x 6.7cm, Total uterine volume 125 cc   homogeneous myometrium  Em thickness = 1.9 mm, symmetrical, hematometra, large amount of homogeneous low level fluid within the cavity, likely cervical stenosis.  No evidnece of intracavitary mass or cervical mass  No LMP recorded (lmp unknown). Patient is postmenopausal.    Review of Systems:   Pertinent items are noted in HPI Denies fever/chills, dizziness, headaches, visual disturbances, fatigue, shortness of breath, chest pain, abdominal pain, vomiting. Pertinent History Reviewed:   Past Surgical History:  Procedure Laterality Date   CERVICAL CONIZATION W/BX  12/20/2012   Procedure: CONIZATION CERVIX WITH BIOPSY;  Surgeon: Lazaro Arms, MD;  Location: [facility];  Service: Gynecology;  Laterality: N/A;   HOLMIUM LASER APPLICATION  12/20/2012   Procedure: HOLMIUM LASER APPLICATION;  Surgeon: Lazaro Arms, MD;  Location: [facility];  Service: Gynecology;  Laterality: N/A;    Past Medical History:  Diagnosis Date   Abnormal Pap smear    Alcohol use    Allergic rhinitis    BV (bacterial vaginosis)    Closed left ankle fracture    GERD (gastroesophageal reflux disease)    Hepatitis    LFT's >1000 (2019) due to methotrexate vs alcohol    High cholesterol    HSV-2 (herpes simplex virus 2) infection    Hypertension    Hypothyroidism 10/17/2019   Pancreatitis    12/19 (thought to be due to alcohol)   Rheumatoid arthritis (HCC)    Seizures (HCC)    dx as child with epilepsy but no seizures since 89 and on no meds   Vaginal Pap smear, abnormal    Reviewed problem list, medications and allergies. Physical Assessment:   Vitals:   10/17/23 0854 10/17/23 0904  BP: (!) 155/94 (!) 143/89  Pulse: 94 92  Weight: 117 lb 3.2 oz (53.2 kg)   Height: 5\' 8"  (1.727 m)   Body mass index is 17.82 kg/m.       Physical Examination:   General appearance: alert, well appearing, and in no distress  Psych: mood appropriate, normal affect  Skin: warm & dry   Cardiovascular: normal heart rate noted  Respiratory: normal respiratory effort, no distress  Abdomen: soft, non-tender   Pelvic: VULVA: normal appearing vulva with no masses, tenderness or lesions, VAGINA: normal appearing vagina with normal color and discharge, no lesions, CERVIX: normal appearing cervix without discharge or lesions  Extremities: no edema   Chaperone: Malachy Mood    Assessment & Plan:  1) Pelvic pain likely due to hematometra and cervical stenosis -again reviewed options including pre-meds with re-attempt in office, EUA, hysterectomy -reviewed in detail each options.  Discussed with EUA while risk potentially lower there is also concerned that  procedure may or may not be successful -Reviewed hysterectomy and potential resolution of pelvic pain as well as future menses.  Discussed risk including but not limited to risk of bleeding, infection, injury requiring further surgical intervention or transfusion -Patient is truly not sure how she wishes to proceed at this time -Also discussed potential to restart Depo until she can make a decision -After much discussion, patient has decided to try for procedure in the office.  Premedication sent and plan to schedule next  available  Meds ordered this encounter  Medications   misoprostol (CYTOTEC) 200 MCG tablet    Sig: Place once as instructed    Dispense:  3 tablet    Refill:  0   diazepam (VALIUM) 5 MG tablet    Sig: Take once ~ prior to appointment    Dispense:  1 tablet    Refill:  0   ibuprofen (ADVIL) 600 MG tablet    Sig: Take 1 tablet (600 mg total) by mouth every 6 (six) hours as needed. Take medication with food to prevent upset stomach    Dispense:  30 tablet    Refill:  1      Return for with Dr. Charlotta Newton next available- in-office procedure.   Myna Hidalgo, DO Attending Obstetrician & Gynecologist, [facility] for [employer], [facility]

## 2023-10-25 NOTE — Progress Notes (Signed)
Established Patient Office Visit   Subjective  Patient ID: Samantha Rosales, female    DOB: Jun 13, 1981  Age: 42 y.o. MRN: [id number]  Chief Complaint  Patient presents with   Follow-up    HTN/ inconsistent cramping. Not having cycles.     She  has a past medical history of Abnormal Pap smear, Alcohol use, Allergic rhinitis, BV (bacterial vaginosis), Closed left ankle fracture, GERD (gastroesophageal reflux disease), Hepatitis, High cholesterol, HSV-2 (herpes simplex virus 2) infection, Hypertension, Hypothyroidism (10/17/2019), Pancreatitis, Rheumatoid arthritis (HCC), Seizures (HCC), and Vaginal Pap smear, abnormal.  HPI Patient presents to the clinic for hypertension follow up. For the details of today's visit, please refer to assessment and plan.   Review of Systems  Constitutional:  Negative for chills and fever.  Eyes:  Negative for blurred vision.  Respiratory:  Negative for shortness of breath.   Cardiovascular:  Negative for chest pain.  Gastrointestinal:  Negative for abdominal pain.  Genitourinary:  Negative for dysuria.  Neurological:  Negative for dizziness and headaches.      Objective:     BP 128/62   Pulse (!) 101   Ht 5\' 8"  (1.727 m)   Wt 122 lb (55.3 kg)   LMP  (LMP Unknown)   SpO2 98%   BMI 18.55 kg/m  BP Readings from Last 3 Encounters:  10/26/23 128/62  10/17/23 (!) 143/89  09/26/23 120/83      Physical Exam Vitals reviewed.  Constitutional:      General: She is not in acute distress.    Appearance: Normal appearance. She is not ill-appearing, toxic-appearing or diaphoretic.  HENT:     Head: Normocephalic.  Eyes:     General:        Right eye: No discharge.        Left eye: No discharge.     Conjunctiva/sclera: Conjunctivae normal.  Cardiovascular:     Rate and Rhythm: Normal rate.     Pulses: Normal pulses.     Heart sounds: Normal heart sounds.  Pulmonary:     Effort: Pulmonary effort is normal. No respiratory distress.     Breath  sounds: Normal breath sounds.  Abdominal:     General: Bowel sounds are normal.     Palpations: Abdomen is soft.     Tenderness: There is no abdominal tenderness. There is no guarding.  Musculoskeletal:        General: Normal range of motion.     Cervical back: Normal range of motion.  Skin:    General: Skin is warm and dry.     Capillary Refill: Capillary refill takes less than 2 seconds.  Neurological:     Mental Status: She is alert.     Coordination: Coordination normal.     Gait: Gait normal.  Psychiatric:        Mood and Affect: Mood normal.        Behavior: Behavior normal.      No results found for any visits on 10/26/23.  The 10-year ASCVD risk score (Arnett DK, et al., 2019) is: 7.4%    Assessment & Plan:  Primary hypertension Assessment & Plan: Vitals:   10/26/23 0901 10/26/23 0909  BP: (!) 148/83 128/62  Continue Losartan 25 mg once daily  Labs ordered. Discussed with  patient to monitor their blood pressure regularly and maintain a heart-healthy diet rich in fruits, vegetables, whole grains, and low-fat dairy, while reducing sodium intake to less than 2,300 mg per day. Regular physical activity,  such as 30 minutes of moderate exercise most days of the week, will help lower blood pressure and improve overall cardiovascular health. Avoiding smoking, limiting alcohol consumption, and managing stress. Take  prescribed medication, & take it as directed and avoid skipping doses. Seek emergency care if your blood pressure is (over 180/100) or you experience chest pain, shortness of breath, or sudden vision changes.Patient verbalizes understanding regarding plan of care and all questions answered.   Orders: -     BMP8+eGFR -     CBC with Differential/Platelet -     Lipid panel  Prediabetes -     Hemoglobin A1c    Return in about 6 months (around 04/24/2024), or if symptoms worsen or fail to improve, for chronic follow-up.   Cruzita Lederer Newman Nip, FNP

## 2023-10-25 NOTE — Patient Instructions (Signed)

## 2023-10-26 ENCOUNTER — Ambulatory Visit (INDEPENDENT_AMBULATORY_CARE_PROVIDER_SITE_OTHER): Payer: Medicare Other | Admitting: Family Medicine

## 2023-10-26 VITALS — BP 128/62 | HR 101 | Ht 68.0 in | Wt 122.0 lb

## 2023-10-26 DIAGNOSIS — I10 Essential (primary) hypertension: Secondary | ICD-10-CM | POA: Diagnosis not present

## 2023-10-26 DIAGNOSIS — R7303 Prediabetes: Secondary | ICD-10-CM

## 2023-10-26 NOTE — Assessment & Plan Note (Signed)
Vitals:   10/26/23 0901 10/26/23 0909  BP: (!) 148/83 128/62  Continue Losartan 25 mg once daily  Labs ordered. Discussed with  patient to monitor their blood pressure regularly and maintain a heart-healthy diet rich in fruits, vegetables, whole grains, and low-fat dairy, while reducing sodium intake to less than 2,300 mg per day. Regular physical activity, such as 30 minutes of moderate exercise most days of the week, will help lower blood pressure and improve overall cardiovascular health. Avoiding smoking, limiting alcohol consumption, and managing stress. Take  prescribed medication, & take it as directed and avoid skipping doses. Seek emergency care if your blood pressure is (over 180/100) or you experience chest pain, shortness of breath, or sudden vision changes.Patient verbalizes understanding regarding plan of care and all questions answered.

## 2023-11-05 ENCOUNTER — Ambulatory Visit: Payer: Medicare Other | Admitting: Obstetrics & Gynecology

## 2023-11-17 ENCOUNTER — Other Ambulatory Visit: Payer: Self-pay | Admitting: Family Medicine

## 2023-11-17 LAB — CBC WITH DIFFERENTIAL/PLATELET
Basophils Absolute: 0 10*3/uL (ref 0.0–0.2)
Basos: 1 %
EOS (ABSOLUTE): 0 10*3/uL (ref 0.0–0.4)
Eos: 1 %
Hematocrit: 28.2 % — ABNORMAL LOW (ref 34.0–46.6)
Hemoglobin: 9 g/dL — ABNORMAL LOW (ref 11.1–15.9)
Immature Grans (Abs): 0.1 10*3/uL (ref 0.0–0.1)
Immature Granulocytes: 2 %
Lymphocytes Absolute: 1.8 10*3/uL (ref 0.7–3.1)
Lymphs: 41 %
MCH: 31.7 pg (ref 26.6–33.0)
MCHC: 31.9 g/dL (ref 31.5–35.7)
MCV: 99 fL — ABNORMAL HIGH (ref 79–97)
Monocytes Absolute: 0.6 10*3/uL (ref 0.1–0.9)
Monocytes: 13 %
Neutrophils Absolute: 1.9 10*3/uL (ref 1.4–7.0)
Neutrophils: 42 %
Platelets: 386 10*3/uL (ref 150–450)
RBC: 2.84 x10E6/uL — ABNORMAL LOW (ref 3.77–5.28)
RDW: 12.7 % (ref 11.7–15.4)
WBC: 4.3 10*3/uL (ref 3.4–10.8)

## 2023-11-17 LAB — LIPID PANEL
Chol/HDL Ratio: 3.6 {ratio} (ref 0.0–4.4)
Cholesterol, Total: 113 mg/dL (ref 100–199)
HDL: 31 mg/dL — ABNORMAL LOW (ref >39)
LDL Chol Calc (NIH): 35 mg/dL (ref 0–99)
Triglycerides: 315 mg/dL — ABNORMAL HIGH (ref 0–149)
VLDL Cholesterol Cal: 47 mg/dL — ABNORMAL HIGH (ref 5–40)

## 2023-11-17 LAB — BMP8+EGFR
BUN/Creatinine Ratio: 11 (ref 9–23)
BUN: 7 mg/dL (ref 6–24)
CO2: 23 mmol/L (ref 20–29)
Calcium: 7.9 mg/dL — ABNORMAL LOW (ref 8.7–10.2)
Chloride: 104 mmol/L (ref 96–106)
Creatinine, Ser: 0.62 mg/dL (ref 0.57–1.00)
Glucose: 88 mg/dL (ref 70–99)
Potassium: 3.6 mmol/L (ref 3.5–5.2)
Sodium: 143 mmol/L (ref 134–144)
eGFR: 113 mL/min/{1.73_m2} (ref >59)

## 2023-11-17 LAB — HEMOGLOBIN A1C
Est. average glucose Bld gHb Est-mCnc: 105 mg/dL
Hgb A1c MFr Bld: 5.3 % (ref 4.8–5.6)

## 2023-11-17 MED ORDER — OMEGA 3 1000 MG PO CAPS: 1.0000 | ORAL_CAPSULE | Freq: Two times a day (BID) | ORAL | 3 refills | Status: DC

## 2023-11-17 MED ORDER — POLYSACCHARIDE IRON COMPLEX 150 MG PO CAPS: 150.0000 mg | ORAL_CAPSULE | Freq: Every day | ORAL | 3 refills | Status: DC

## 2023-11-19 ENCOUNTER — Ambulatory Visit (INDEPENDENT_AMBULATORY_CARE_PROVIDER_SITE_OTHER): Payer: Medicare Other | Admitting: Obstetrics & Gynecology

## 2023-11-19 ENCOUNTER — Encounter: Payer: Self-pay | Admitting: Obstetrics & Gynecology

## 2023-11-19 VITALS — BP 126/82 | HR 109 | Ht 68.0 in | Wt 117.4 lb

## 2023-11-19 DIAGNOSIS — N882 Stricture and stenosis of cervix uteri: Secondary | ICD-10-CM

## 2023-11-19 DIAGNOSIS — N857 Hematometra: Secondary | ICD-10-CM | POA: Diagnosis not present

## 2023-11-19 DIAGNOSIS — R102 Pelvic and perineal pain: Secondary | ICD-10-CM | POA: Diagnosis not present

## 2023-11-19 DIAGNOSIS — Z01818 Encounter for other preprocedural examination: Secondary | ICD-10-CM

## 2023-11-19 NOTE — Progress Notes (Signed)
GYN VISIT Patient name: Samantha Rosales MRN [id number]  Date of birth: 09-Aug-1981 Chief Complaint:   Procedure (Cervical dilation for cervical stenosis)  History of Present Illness:   Samantha Rosales is a 42 y.o. [id number] female being seen today for the following concerns:.     Hematometra/pelvic pain: To review, pt has been experiencing significant pelvic pain due to hematometra 2/2 scarring from conization.  Previously on Depot for many years, but stopped.  She is currently taking ibuprofen with only some improvement of her pain.   [place] completed 09/2023:  6.8 x 5.2 x 6.7cm, Total uterine volume 125 cc   homogeneous myometrium  Em thickness = 1.9 mm, symmetrical, hematometra, large amount of homogeneous low level fluid within the cavity, likely cervical stenosis.  No evidnece of intracavitary mass or cervical mass   No LMP recorded (lmp unknown). Patient is postmenopausal.    Review of Systems:   Pertinent items are noted in HPI Denies fever/chills, dizziness, headaches, visual disturbances, fatigue, shortness of breath, chest pain, abdominal pain, vomiting. Pertinent History Reviewed:   Past Surgical History:  Procedure Laterality Date   CERVICAL CONIZATION W/BX  12/20/2012   Procedure: CONIZATION CERVIX WITH BIOPSY;  Surgeon: Lazaro Arms, MD;  Location: [facility];  Service: Gynecology;  Laterality: N/A;   HOLMIUM LASER APPLICATION  12/20/2012   Procedure: HOLMIUM LASER APPLICATION;  Surgeon: Lazaro Arms, MD;  Location: [facility];  Service: Gynecology;  Laterality: N/A;    Past Medical History:  Diagnosis Date   Abnormal Pap smear    Alcohol use    Allergic rhinitis    BV (bacterial vaginosis)    Closed left ankle fracture    GERD (gastroesophageal reflux disease)    Hepatitis    LFT's >1000 (2019) due to methotrexate vs alcohol   High cholesterol    HSV-2 (herpes simplex virus 2) infection    Hypertension    Hypothyroidism 10/17/2019   Pancreatitis    12/19 (thought to  be due to alcohol)   Rheumatoid arthritis (HCC)    Seizures (HCC)    dx as child with epilepsy but no seizures since 89 and on no meds   Vaginal Pap smear, abnormal    Reviewed problem list, medications and allergies. Physical Assessment:   Vitals:   11/19/23 1352  BP: 126/82  Pulse: (!) 109  Weight: 117 lb 6.4 oz (53.3 kg)  Height: 5\' 8"  (1.727 m)  Body mass index is 17.85 kg/m.       Physical Examination:   General appearance: alert, well appearing, and in no distress  Psych: mood appropriate, normal affect  Skin: warm & dry   Cardiovascular: normal heart rate noted  Respiratory: normal respiratory effort, no distress  Abdomen: soft, non-tender   Pelvic: VULVA: normal appearing vulva with no masses, tenderness or lesions, VAGINA: normal appearing vagina with normal color and discharge, no lesions, CERVIX: normal appearing cervix- small dimple seen at cervical os  Extremities: no edema   Chaperone:  Freddie Apley     Endometrial Biopsy Procedure Note  Pre-operative Diagnosis: cervical stenosis, hematometra  Post-operative Diagnosis: same  Procedure Details   The risks (including infection, bleeding, pain, and uterine perforation) and benefits of the procedure were explained to the patient and Written informed consent was obtained.  Antibiotic prophylaxis against endocarditis was not indicated.   The patient was placed in the dorsal lithotomy position.  Bimanual exam showed the uterus to be in the neutral position.  A speculum  inserted in the vagina, and the cervix prepped with betadine.     A single tooth tenaculum was applied to the anterior lip of the cervix for stabilization.  Despite use of os finder and small dilators- unable to enter into cavity.  Several attempts were made without success.  Condition: Stable  Complications: None  Assessment & Plan:  Cervical stenosis (uterine cervix), hemometra, pelvic pain -Unsuccessful cervical dilation -Reviewed options  including exam under anesthesia at [facility] with ability for further instrumentation -Discussed risk of procedure including failure as well as potential risk for uterine perforation requiring further surgical intervention or damage to surrounding organs -Discussed alternative of hysterectomy -Patient would prefer conservative management we will plan to proceed with exam under anesthesia -Surgical referral created, plan to schedule for Jan 14  Rosales Placed This Encounter  Procedures   Ambulatory Referral For Surgery Scheduling    Return for tbd.   Myna Hidalgo, DO Attending Obstetrician & Gynecologist, [facility] for [employer], [facility]

## 2023-11-21 ENCOUNTER — Encounter: Payer: Self-pay | Admitting: Obstetrics & Gynecology

## 2023-12-11 NOTE — Patient Instructions (Signed)
 Your procedure is scheduled on: 12/18/2023  Report to [facility] Main Entrance at     12:  PM.  Call this number if you have problems the morning of surgery: [phone]   Remember:   Do not Eat or Drink after midnight         No Smoking the morning of surgery  :  Take these medicines the morning of surgery with A SIP OF WATER : Lexapro , Valium , and Gabapentin     Do not wear jewelry, make-up or nail polish.  Do not wear lotions, powders, or perfumes. You may wear deodorant.  Do not shave 48 hours prior to surgery. Men may shave face and neck.  Do not bring valuables to the hospital.  Contacts, dentures or bridgework may not be worn into surgery.  Leave suitcase in the car. After surgery it may be brought to your room.  For patients admitted to the hospital, checkout time is 11:00 AM the day of discharge.   Patients discharged the day of surgery will not be allowed to drive home.    Special Instructions: Shower using CHG night before surgery and shower the day of surgery use CHG.  Use special wash - you have one bottle of CHG for all showers.  You should use approximately 1/2 of the bottle for each shower. How to Use Chlorhexidine  Before Surgery Chlorhexidine  gluconate (CHG) is a germ-killing (antiseptic) solution that is used to clean the skin. It can get rid of the bacteria that normally live on the skin and can keep them away for about 24 hours. To clean your skin with CHG, you may be given: A CHG solution to use in the shower or as part of a sponge bath. A prepackaged cloth that contains CHG. Cleaning your skin with CHG may help lower the risk for infection: While you are staying in the intensive care unit of the hospital. If you have a vascular access, such as a central line, to provide short-term or long-term access to your veins. If you have a catheter to drain urine from your bladder. If you are on a ventilator. A ventilator is a machine that helps you breathe by moving air in  and out of your lungs. After surgery. What are the risks? Risks of using CHG include: A skin reaction. Hearing loss, if CHG gets in your ears and you have a perforated eardrum. Eye injury, if CHG gets in your eyes and is not rinsed out. The CHG product catching fire. Make sure that you avoid smoking and flames after applying CHG to your skin. Do not use CHG: If you have a chlorhexidine  allergy  or have previously reacted to chlorhexidine . On babies younger than 40 months of age. How to use CHG solution Use CHG only as told by your health care provider, and follow the instructions on the label. Use the full amount of CHG as directed. Usually, this is one bottle. During a shower Follow these steps when using CHG solution during a shower (unless your health care provider gives you different instructions): Start the shower. Use your normal soap and shampoo to wash your face and hair. Turn off the shower or move out of the shower stream. Pour the CHG onto a clean washcloth. Do not use any type of brush or rough-edged sponge. Starting at your neck, lather your body down to your toes. Make sure you follow these instructions: If you will be having surgery, pay special attention to the part of your body where you  will be having surgery. Scrub this area for at least 1 minute. Do not use CHG on your head or face. If the solution gets into your ears or eyes, rinse them well with water . Avoid your genital area. Avoid any areas of skin that have broken skin, cuts, or scrapes. Scrub your back and under your arms. Make sure to wash skin folds. Let the lather sit on your skin for 1-2 minutes or as long as told by your health care provider. Thoroughly rinse your entire body in the shower. Make sure that all body creases and crevices are rinsed well. Dry off with a clean towel. Do not put any substances on your body afterward--such as powder, lotion, or perfume--unless you are told to do so by your health  care provider. Only use lotions that are recommended by the manufacturer. Put on clean clothes or pajamas. If it is the night before your surgery, sleep in clean sheets.  During a sponge bath Follow these steps when using CHG solution during a sponge bath (unless your health care provider gives you different instructions): Use your normal soap and shampoo to wash your face and hair. Pour the CHG onto a clean washcloth. Starting at your neck, lather your body down to your toes. Make sure you follow these instructions: If you will be having surgery, pay special attention to the part of your body where you will be having surgery. Scrub this area for at least 1 minute. Do not use CHG on your head or face. If the solution gets into your ears or eyes, rinse them well with water . Avoid your genital area. Avoid any areas of skin that have broken skin, cuts, or scrapes. Scrub your back and under your arms. Make sure to wash skin folds. Let the lather sit on your skin for 1-2 minutes or as long as told by your health care provider. Using a different clean, wet washcloth, thoroughly rinse your entire body. Make sure that all body creases and crevices are rinsed well. Dry off with a clean towel. Do not put any substances on your body afterward--such as powder, lotion, or perfume--unless you are told to do so by your health care provider. Only use lotions that are recommended by the manufacturer. Put on clean clothes or pajamas. If it is the night before your surgery, sleep in clean sheets. How to use CHG prepackaged cloths Only use CHG cloths as told by your health care provider, and follow the instructions on the label. Use the CHG cloth on clean, dry skin. Do not use the CHG cloth on your head or face unless your health care provider tells you to. When washing with the CHG cloth: Avoid your genital area. Avoid any areas of skin that have broken skin, cuts, or scrapes. Before surgery Follow these  steps when using a CHG cloth to clean before surgery (unless your health care provider gives you different instructions): Using the CHG cloth, vigorously scrub the part of your body where you will be having surgery. Scrub using a back-and-forth motion for 3 minutes. The area on your body should be completely wet with CHG when you are done scrubbing. Do not rinse. Discard the cloth and let the area air-dry. Do not put any substances on the area afterward, such as powder, lotion, or perfume. Put on clean clothes or pajamas. If it is the night before your surgery, sleep in clean sheets.  For general bathing Follow these steps when using CHG cloths for general bathing (  unless your health care provider gives you different instructions). Use a separate CHG cloth for each area of your body. Make sure you wash between any folds of skin and between your fingers and toes. Wash your body in the following order, switching to a new cloth after each step: The front of your neck, shoulders, and chest. Both of your arms, under your arms, and your hands. Your stomach and groin area, avoiding the genitals. Your right leg and foot. Your left leg and foot. The back of your neck, your back, and your buttocks. Do not rinse. Discard the cloth and let the area air-dry. Do not put any substances on your body afterward--such as powder, lotion, or perfume--unless you are told to do so by your health care provider. Only use lotions that are recommended by the manufacturer. Put on clean clothes or pajamas. Contact a health care provider if: Your skin gets irritated after scrubbing. You have questions about using your solution or cloth. You swallow any chlorhexidine . Call your local poison control center (919-540-5859 in the [place] Get help right away if: Your eyes itch badly, or they become very red or swollen. Your skin itches badly and is red or swollen. Your hearing changes. You have trouble seeing. You have  swelling or tingling in your mouth or throat. You have trouble breathing. These symptoms may represent a serious problem that is an emergency. Do not wait to see if the symptoms will go away. Get medical help right away. Call your local emergency services (911 in the [place] Do not drive yourself to the hospital. Summary Chlorhexidine  gluconate (CHG) is a germ-killing (antiseptic) solution that is used to clean the skin. Cleaning your skin with CHG may help to lower your risk for infection. You may be given CHG to use for bathing. It may be in a bottle or in a prepackaged cloth to use on your skin. Carefully follow your health care provider's instructions and the instructions on the product label. Do not use CHG if you have a chlorhexidine  allergy . Contact your health care provider if your skin gets irritated after scrubbing. This information is not intended to replace advice given to you by your health care provider. Make sure you discuss any questions you have with your health care provider. Document Revised: 03/20/2022 Document Reviewed: 01/31/2021 Elsevier Patient Education  2023 [employer] Dilation of Cervix, Care After The following information offers guidance on how to care for yourself after your procedure. Your doctor may also give you more specific instructions. If you have problems or questions, contact your doctor. What can I expect after the procedure? After the procedure, it is common to have: Mild pain or cramps. Some bleeding or spotting from the vagina. These may last for up to 2 weeks. Follow these instructions at home: Medicines Take over-the-counter and prescription medicines only as told by your doctor. If told, take steps to prevent problems with pooping (constipation). You may need to: Drink enough fluid to keep your pee (urine) pale yellow. Take medicines. You will be told what medicines to take. Eat foods that are high in fiber. These include beans, whole grains,  and fresh fruits and vegetables. Limit foods that are high in fat and sugar. These include fried or sweet foods. Ask your doctor if you should avoid driving or using machines while you are taking your medicine. Activity  If you were given a medicine to help you relax (sedative) during your procedure, it can affect you for many hours. Do  not drive or use machinery until your doctor says that it is safe. Rest as told by your doctor. Get up to take short walks every 1-2 hours. Ask for help if you feel weak or unsteady. Do not lift anything that is heavier than 10 lb (4.5 kg), or the limit that you are told. Return to your normal activities when your doctor says that it is safe. Lifestyle For at least 2 weeks, or as long as told by your doctor: Do not douche. Do not use tampons. Do not have sex. General instructions Do not take baths, swim, or use a hot tub. Ask your doctor if you may take showers. Do not smoke or use any products that contain nicotine  or tobacco. These can delay healing. If you need help quitting, ask your doctor. Wear compression stockings as told by your doctor. It is up to you to get the results of your procedure. Ask how to get your results when they are ready. Keep all follow-up visits. Contact a doctor if: You have very bad cramps that get worse or do not get better with medicine. You have very bad pain in your belly (abdomen). You cannot drink fluids without vomiting. You have pain in the area just above your thighs. You have fluid from your vagina that smells bad. You have a rash. Get help right away if: You are bleeding a lot from your vagina. This means soaking more than one sanitary pad in 1 hour, and this happens for 2 hours in a row. You have a fever that is above 100.105F (38C). Your belly feels very tender or hard. You have chest pain. You have trouble breathing. You feel dizzy or light-headed. You faint. You have pain in your neck or shoulder  area. These symptoms may be an emergency. Get help right away. Call your local emergency services (911 in the [place] Do not wait to see if the symptoms will go away. Do not drive yourself to the hospital. Summary After your procedure, it is common to have pain or cramping. It is also common to have bleeding or spotting from your vagina. Rest as told. Get up to take short walks every 1-2 hours. Do not lift anything that is heavier than 10 lb (4.5 kg), or the limit that you are told. Get help right away if you have problems from the procedure. Ask your doctor what problems to watch for. This information is not intended to replace advice given to you by your health care provider. Make sure you discuss any questions you have with your health care provider. Document Revised: 11/10/2020 Document Reviewed: 11/10/2020 Elsevier Patient Education  2024 Elsevier Inc. General Anesthesia, Adult, Care After The following information offers guidance on how to care for yourself after your procedure. Your health care provider may also give you more specific instructions. If you have problems or questions, contact your health care provider. What can I expect after the procedure? After the procedure, it is common for people to: Have pain or discomfort at the IV site. Have nausea or vomiting. Have a sore throat or hoarseness. Have trouble concentrating. Feel cold or chills. Feel weak, sleepy, or tired (fatigue). Have soreness and body aches. These can affect parts of the body that were not involved in surgery. Follow these instructions at home: For the time period you were told by your health care provider:  Rest. Do not participate in activities where you could fall or become injured. Do not drive or use machinery. Do  not drink alcohol. Do not take sleeping pills or medicines that cause drowsiness. Do not make important decisions or sign legal documents. Do not take care of children on your  own. General instructions Drink enough fluid to keep your urine pale yellow. If you have sleep apnea, surgery and certain medicines can increase your risk for breathing problems. Follow instructions from your health care provider about wearing your sleep device: Anytime you are sleeping, including during daytime naps. While taking prescription pain medicines, sleeping medicines, or medicines that make you drowsy. Return to your normal activities as told by your health care provider. Ask your health care provider what activities are safe for you. Take over-the-counter and prescription medicines only as told by your health care provider. Do not use any products that contain nicotine  or tobacco. These products include cigarettes, chewing tobacco, and vaping devices, such as e-cigarettes. These can delay incision healing after surgery. If you need help quitting, ask your health care provider. Contact a health care provider if: You have nausea or vomiting that does not get better with medicine. You vomit every time you eat or drink. You have pain that does not get better with medicine. You cannot urinate or have bloody urine. You develop a skin rash. You have a fever. Get help right away if: You have trouble breathing. You have chest pain. You vomit blood. These symptoms may be an emergency. Get help right away. Call 911. Do not wait to see if the symptoms will go away. Do not drive yourself to the hospital. Summary After the procedure, it is common to have a sore throat, hoarseness, nausea, vomiting, or to feel weak, sleepy, or fatigue. For the time period you were told by your health care provider, do not drive or use machinery. Get help right away if you have difficulty breathing, have chest pain, or vomit blood. These symptoms may be an emergency. This information is not intended to replace advice given to you by your health care provider. Make sure you discuss any questions you have with  your health care provider. Document Revised: 02/17/2022 Document Reviewed: 02/17/2022 Elsevier Patient Education  2024 [employer].

## 2023-12-13 ENCOUNTER — Encounter (HOSPITAL_COMMUNITY): Payer: Self-pay | Admitting: Lactation Services

## 2023-12-13 ENCOUNTER — Encounter (HOSPITAL_COMMUNITY)
Admission: RE | Admit: 2023-12-13 | Discharge: 2023-12-13 | Disposition: A | Discharge status: Home / Self Care | Payer: BC Managed Care – PPO | Source: Ambulatory Visit | Attending: Obstetrics & Gynecology | Admitting: Obstetrics & Gynecology

## 2023-12-13 ENCOUNTER — Telehealth: Payer: Self-pay

## 2023-12-13 DIAGNOSIS — N882 Stricture and stenosis of cervix uteri: Secondary | ICD-10-CM | POA: Diagnosis not present

## 2023-12-13 DIAGNOSIS — R102 Pelvic and perineal pain: Secondary | ICD-10-CM | POA: Diagnosis not present

## 2023-12-13 DIAGNOSIS — Z01812 Encounter for preprocedural laboratory examination: Secondary | ICD-10-CM | POA: Diagnosis present

## 2023-12-13 DIAGNOSIS — Z01818 Encounter for other preprocedural examination: Secondary | ICD-10-CM

## 2023-12-13 DIAGNOSIS — N857 Hematometra: Secondary | ICD-10-CM

## 2023-12-13 LAB — POCT PREGNANCY, URINE: Preg Test, Ur: NEGATIVE

## 2023-12-13 NOTE — Telephone Encounter (Signed)
 Copied from CRM [id number]. Topic: Clinical - Medical Advice >> Dec 12, 2023  2:52 PM Curlee H wrote: Reason for CRM: Patient has been experiencing nasal congestion, sneezing, coughing and her left ear appears to be draining. Patient would like to be seen if possible - contact center agent didn't see anything until February with any of the providers at [facility].

## 2023-12-13 NOTE — Telephone Encounter (Signed)
 Pt went to ED

## 2023-12-15 NOTE — H&P (Signed)
 [facility] Attending History and Physical  Samantha Rosales is a 43 y.o. [id number] who presents for exam under anesthesia, cervical dilation and possible hysteroscopy  Hematometra/pelvic pain: To review, pt has been experiencing significant pelvic pain due to hematometra 2/2 scarring from conization.  Previously on Depot for many years, but stopped.  She is currently taking ibuprofen  with only some improvement of her pain and still notes intermittent pelvic pain.  She otherwise reports no acute complaints.   US  completed 09/2023:  6.8 x 5.2 x 6.7cm, Total uterine volume 125 cc   homogeneous myometrium  Em thickness = 1.9 mm, symmetrical, hematometra, large amount of homogeneous low level fluid within the cavity, likely cervical stenosis.  No evidnece of intracavitary mass or cervical mass  Denies any abnormal vaginal discharge, fevers, chills, sweats, dysuria, nausea, vomiting, other GI or GU symptoms or other general symptoms.  Past Medical History:  Diagnosis Date   Abnormal Pap smear    Alcohol use    Allergic rhinitis    BV (bacterial vaginosis)    Closed left ankle fracture    GERD (gastroesophageal reflux disease)    Hepatitis    LFT's >1000 (2019) due to methotrexate vs alcohol   High cholesterol    HSV-2 (herpes simplex virus 2) infection    Hypertension    Hypothyroidism 10/17/2019   Pancreatitis    12/19 (thought to be due to alcohol)   Rheumatoid arthritis (HCC)    Seizures (HCC)    dx as child with epilepsy but no seizures since 89 and on no meds   Vaginal Pap smear, abnormal    Past Surgical History:  Procedure Laterality Date   arm fracture     CERVICAL CONIZATION W/BX  12/20/2012   Procedure: CONIZATION CERVIX WITH BIOPSY;  Surgeon: Vonn VEAR Inch, MD;  Location: [facility];  Service: Gynecology;  Laterality: N/A;   FRACTURE SURGERY Left 2024   left arm   FRACTURE SURGERY Left 2022   left fracture   HOLMIUM LASER APPLICATION   12/20/2012   Procedure: HOLMIUM LASER APPLICATION;  Surgeon: Vonn VEAR Inch, MD;  Location: [facility];  Service: Gynecology;  Laterality: N/A;   OB History  Gravida Para Term Preterm AB Living  4 2 2  2 2   SAB IAB Ectopic Multiple Live Births  1 1   2     # Outcome Date GA Lbr Len/2nd Weight Sex Type Anes PTL Lv  4 Term 2011    M Vag-Spont   LIV  3 Term 2002    F Vag-Spont   LIV  2 IAB           1 SAB           Patient denies any other pertinent gynecologic issues.  No current facility-administered medications on file prior to encounter.   Current Outpatient Medications on File Prior to Encounter  Medication Sig Dispense Refill   CALCIUM  PO Take 1 tablet by mouth daily.     Cholecalciferol (VITAMIN D3 PO) Take 1 tablet by mouth daily.     diazepam  (VALIUM ) 5 MG tablet Take once ~ prior to appointment 1 tablet 0   escitalopram  (LEXAPRO ) 10 MG tablet Take 1 tablet (10 mg total) by mouth daily. 90 tablet 3   fluticasone  (FLONASE ) 50 MCG/ACT nasal spray Place 2 sprays into both nostrils as needed for allergies or rhinitis.     gabapentin  (NEURONTIN ) 300 MG capsule Take 300 mg by mouth daily as  needed.     hydrOXYzine  (VISTARIL ) 50 MG capsule Take 1 capsule (50 mg total) by mouth 3 (three) times daily as needed. (Patient taking differently: Take 50 mg by mouth 3 (three) times daily as needed for itching or anxiety.) 30 capsule 3   ibuprofen  (ADVIL ) 600 MG tablet Take 1 tablet (600 mg total) by mouth every 6 (six) hours as needed. Take medication with food to prevent upset stomach 30 tablet 1   iron  polysaccharides (NU-IRON ) 150 MG capsule Take 1 capsule (150 mg total) by mouth daily. 30 capsule 3   losartan  (COZAAR ) 25 MG tablet TAKE 1 TABLET (25 MG TOTAL) BY MOUTH DAILY. 90 tablet 1   MAGNESIUM  PO Take 1 tablet by mouth daily.     Multiple Vitamin (MULTIVITAMIN WITH MINERALS) TABS tablet Take 1 tablet by mouth daily.     Multiple Vitamins-Minerals (ZINC  PO) Take 1 tablet by mouth daily.      norethindrone  (AYGESTIN ) 5 MG tablet Take by mouth daily.     Omega 3 1000 MG CAPS Take 1 capsule (1,000 mg total) by mouth 2 (two) times daily. 60 capsule 3   promethazine  (PHENERGAN ) 25 MG tablet Take 1 tablet (25 mg total) by mouth every 6 (six) hours as needed for nausea or vomiting. 30 tablet 1   acetaminophen  (TYLENOL ) 325 MG tablet Take 2 tablets (650 mg total) by mouth every 6 (six) hours as needed. (Patient not taking: Reported on 09/03/2023) 36 tablet 0   misoprostol  (CYTOTEC ) 200 MCG tablet Place once as instructed 3 tablet 0   potassium chloride  SA (KLOR-CON  M) 20 MEQ tablet Take 1 tablet (20 mEq total) by mouth daily for 3 days. 3 tablet 0   Allergies  Allergen Reactions   Penicillins Hives    Social History:   reports that she has been smoking cigarettes. She has a 7.5 pack-year smoking history. She has never used smokeless tobacco. She reports current alcohol use of about 28.0 standard drinks of alcohol per week. She reports that she does not use drugs. Family History  Problem Relation Age of Onset   Hypertension Mother    Hypertension Father    Pancreatic cancer Paternal Uncle    Colon cancer Neg Hx    Esophageal cancer Neg Hx    Inflammatory bowel disease Neg Hx    Liver disease Neg Hx    Rectal cancer Neg Hx    Stomach cancer Neg Hx     Review of Systems: Pertinent items noted in HPI and remainder of comprehensive ROS otherwise negative.  PHYSICAL EXAM:  Blood pressure (!) 159/98, pulse 87, temperature 98.8 F (37.1 C), resp. rate 13, height 5' 8 (1.727 m), weight 53.1 kg, SpO2 100%. CONSTITUTIONAL: Well-developed, well-nourished female in no acute distress.  SKIN: Skin is warm and dry. No rash noted. Not diaphoretic. No erythema. No pallor. NEUROLOGIC: Alert and oriented to person, place, and time. Normal reflexes, muscle tone coordination. No cranial nerve deficit noted. PSYCHIATRIC: Normal mood and affect. Normal behavior. Normal judgment and thought  content. CARDIOVASCULAR: Normal heart rate noted, regular rhythm RESPIRATORY: Effort and breath sounds normal, no problems with respiration noted ABDOMEN: Soft, nontender, nondistended. PELVIC: deferred MUSCULOSKELETAL: no calf tenderness bilaterally EXT: no edema bilaterally, normal pulses  Labs: Results for orders placed or performed during the hospital encounter of 12/13/23 (from the past 2 weeks)  Pregnancy, urine POC   Collection Time: 12/13/23  9:43 AM  Result Value Ref Range   Preg Test, Ur NEGATIVE NEGATIVE  Assessment: Hematometra  Plan: Exam under anesthesia with possible intervention including cervical dilation and hysteroscopy -NPO -LR @ 125cc/hr -SCDs to OR -Risk/benefits and alternatives reviewed with the patient including but not limited to risk of bleeding, infection and injury to surrounding organs.  Discussed potential inability to dilate cervix as well as potential for uterine perforation requiring further surgical intervention.  Questions and concerns were addressed and pt desires to proceed  Matthew Cina, DO Attending Obstetrician & Gynecologist, [facility] for [facility], [facility]

## 2023-12-18 ENCOUNTER — Ambulatory Visit (HOSPITAL_BASED_OUTPATIENT_CLINIC_OR_DEPARTMENT_OTHER): Payer: BC Managed Care – PPO | Admitting: Certified Registered"

## 2023-12-18 ENCOUNTER — Ambulatory Visit (HOSPITAL_COMMUNITY): Payer: BC Managed Care – PPO | Admitting: Certified Registered"

## 2023-12-18 ENCOUNTER — Encounter (HOSPITAL_COMMUNITY)
Admission: RE | Disposition: A | Discharge status: Home / Self Care | Payer: Self-pay | Source: Home / Self Care | Attending: Obstetrics & Gynecology

## 2023-12-18 ENCOUNTER — Ambulatory Visit (HOSPITAL_COMMUNITY)
Admission: RE | Admit: 2023-12-18 | Discharge: 2023-12-18 | Disposition: A | Discharge status: Home / Self Care | Payer: BC Managed Care – PPO | Attending: Obstetrics & Gynecology | Admitting: Obstetrics & Gynecology

## 2023-12-18 ENCOUNTER — Encounter (HOSPITAL_COMMUNITY): Payer: Self-pay | Admitting: Obstetrics & Gynecology

## 2023-12-18 DIAGNOSIS — N857 Hematometra: Secondary | ICD-10-CM

## 2023-12-18 DIAGNOSIS — F32A Depression, unspecified: Secondary | ICD-10-CM | POA: Diagnosis not present

## 2023-12-18 DIAGNOSIS — N882 Stricture and stenosis of cervix uteri: Secondary | ICD-10-CM

## 2023-12-18 DIAGNOSIS — E039 Hypothyroidism, unspecified: Secondary | ICD-10-CM | POA: Diagnosis not present

## 2023-12-18 DIAGNOSIS — F17210 Nicotine dependence, cigarettes, uncomplicated: Secondary | ICD-10-CM | POA: Insufficient documentation

## 2023-12-18 DIAGNOSIS — I10 Essential (primary) hypertension: Secondary | ICD-10-CM | POA: Diagnosis not present

## 2023-12-18 SURGERY — EXAM UNDER ANESTHESIA
Anesthesia: General | Site: Vagina

## 2023-12-18 MED ORDER — PROPOFOL 10 MG/ML IV BOLUS
INTRAVENOUS | Status: AC
  Filled 2023-12-18: qty 20

## 2023-12-18 MED ORDER — LIDOCAINE HCL (PF) 2 % IJ SOLN
INTRAMUSCULAR | Status: AC
  Filled 2023-12-18: qty 5

## 2023-12-18 MED ORDER — IBUPROFEN 600 MG PO TABS: 600.0000 mg | ORAL_TABLET | Freq: Four times a day (QID) | ORAL | 0 refills | Status: DC | PRN

## 2023-12-18 MED ORDER — PROPOFOL 10 MG/ML IV BOLUS
INTRAVENOUS | Status: DC | PRN
  Administered 2023-12-18: 200 mg via INTRAVENOUS

## 2023-12-18 MED ORDER — DEXAMETHASONE SODIUM PHOSPHATE 10 MG/ML IJ SOLN
INTRAMUSCULAR | Status: DC | PRN
  Administered 2023-12-18: 8 mg via INTRAVENOUS

## 2023-12-18 MED ORDER — LIDOCAINE-EPINEPHRINE 0.5 %-1:200000 IJ SOLN
INTRAMUSCULAR | Status: DC | PRN
  Administered 2023-12-18: 20 mL

## 2023-12-18 MED ORDER — LIDOCAINE-EPINEPHRINE (PF) 1 %-1:200000 IJ SOLN
INTRAMUSCULAR | Status: AC
  Filled 2023-12-18: qty 30

## 2023-12-18 MED ORDER — MIDAZOLAM HCL 2 MG/2ML IJ SOLN
INTRAMUSCULAR | Status: AC
  Filled 2023-12-18: qty 2

## 2023-12-18 MED ORDER — LIDOCAINE-EPINEPHRINE 0.5 %-1:200000 IJ SOLN
INTRAMUSCULAR | Status: AC
  Filled 2023-12-18: qty 50

## 2023-12-18 MED ORDER — FENTANYL CITRATE (PF) 100 MCG/2ML IJ SOLN
INTRAMUSCULAR | Status: AC
  Filled 2023-12-18: qty 2

## 2023-12-18 MED ORDER — ONDANSETRON HCL 4 MG/2ML IJ SOLN
INTRAMUSCULAR | Status: AC
  Filled 2023-12-18: qty 4

## 2023-12-18 MED ORDER — DEXAMETHASONE SODIUM PHOSPHATE 10 MG/ML IJ SOLN
INTRAMUSCULAR | Status: AC
  Filled 2023-12-18: qty 2

## 2023-12-18 MED ORDER — LIDOCAINE HCL (CARDIAC) PF 100 MG/5ML IV SOSY
PREFILLED_SYRINGE | INTRAVENOUS | Status: DC | PRN
  Administered 2023-12-18: 100 mg via INTRAVENOUS

## 2023-12-18 MED ORDER — MIDAZOLAM HCL 2 MG/2ML IJ SOLN
INTRAMUSCULAR | Status: DC | PRN
  Administered 2023-12-18: 2 mg via INTRAVENOUS

## 2023-12-18 MED ORDER — FENTANYL CITRATE (PF) 100 MCG/2ML IJ SOLN
INTRAMUSCULAR | Status: DC | PRN
  Administered 2023-12-18 (×2): 25 ug via INTRAVENOUS
  Administered 2023-12-18: 50 ug via INTRAVENOUS

## 2023-12-18 MED ORDER — ONDANSETRON HCL 4 MG/2ML IJ SOLN
INTRAMUSCULAR | Status: DC | PRN
  Administered 2023-12-18: 4 mg via INTRAVENOUS

## 2023-12-18 MED ORDER — LACTATED RINGERS IV SOLN: INTRAVENOUS | Status: DC

## 2023-12-18 MED ORDER — LACTATED RINGERS IV SOLN: INTRAVENOUS | Status: DC | PRN

## 2023-12-18 SURGICAL SUPPLY — 14 items
BLADE SURG SZ11 CARB STEEL (BLADE) ×1 IMPLANT
CLOTH BEACON ORANGE TIMEOUT ST (SAFETY) ×2 IMPLANT
COVER LIGHT HANDLE STERIS (MISCELLANEOUS) ×4 IMPLANT
DECANTER SPIKE VIAL GLASS SM (MISCELLANEOUS) ×2 IMPLANT
GAUZE 4X4 16PLY ~~LOC~~+RFID DBL (SPONGE) ×1 IMPLANT
GLOVE BIO SURGEON STRL SZ 6.5 (GLOVE) ×2 IMPLANT
GLOVE BIOGEL PI IND STRL 7.0 (GLOVE) ×6 IMPLANT
GOWN STRL REUS W/ TWL LRG LVL3 (GOWN DISPOSABLE) ×4 IMPLANT
KIT TURNOVER KIT A (KITS) ×2 IMPLANT
NS IRRIG 1000ML POUR BTL (IV SOLUTION) ×2 IMPLANT
PACK VAGINAL MINOR WOMEN LF (CUSTOM PROCEDURE TRAY) ×2 IMPLANT
PAD ARMBOARD 7.5X6 YLW CONV (MISCELLANEOUS) ×2 IMPLANT
POSITIONER HEAD 8X9X4 ADT (SOFTGOODS) ×2 IMPLANT
SET BASIN LINEN APH (SET/KITS/TRAYS/PACK) ×2 IMPLANT

## 2023-12-18 NOTE — Anesthesia Preprocedure Evaluation (Addendum)
 Anesthesia Evaluation  Patient identified by MRN, date of birth, ID band Patient awake    Reviewed: Allergy  & Precautions, H&P , NPO status , Patient's Chart, lab work & pertinent test results  History of Anesthesia Complications Negative for: history of anesthetic complications  Airway Mallampati: I  TM Distance: >3 FB Neck ROM: Full    Dental no notable dental hx. (+) Teeth Intact, Dental Advisory Given   Pulmonary Current Smoker, PE: am cough.   Pulmonary exam normal breath sounds clear to auscultation       Cardiovascular hypertension, Normal cardiovascular exam Rhythm:Regular Rate:Normal     Neuro/Psych Seizures - (none since 1999),  PSYCHIATRIC DISORDERS  Depression       GI/Hepatic ,GERD  Medicated,,(+) Hepatitis -pancreatitis   Endo/Other  Hypothyroidism    Renal/GU Renal disease     Musculoskeletal  (+) Arthritis  (off steroids), Rheumatoid disorders,    Abdominal Normal abdominal exam  (+)   Peds  Hematology   Anesthesia Other Findings   Reproductive/Obstetrics                              Anesthesia Physical Anesthesia Plan  ASA: 3  Anesthesia Plan: General   Post-op Pain Management: Minimal or no pain anticipated   Induction: Intravenous, Rapid sequence and Cricoid pressure planned  PONV Risk Score and Plan: Propofol  infusion  Airway Management Planned: Nasal Cannula and Natural Airway  Additional Equipment: None  Intra-op Plan:   Post-operative Plan:   Informed Consent: I have reviewed the patients History and Physical, chart, labs and discussed the procedure including the risks, benefits and alternatives for the proposed anesthesia with the patient or authorized representative who has indicated his/her understanding and acceptance.     Dental advisory given  Plan Discussed with: CRNA  Anesthesia Plan Comments:          Anesthesia Quick  Evaluation

## 2023-12-18 NOTE — Op Note (Signed)
 Operative Report  PreOp: 1) Hematometra 2) Cervical stenosis PostOp: same Procedure:  Exam under anesthesia, cervical dilation Surgeon: Dr. Delon Prude Anesthesia: General Complications:none EBL:  5cc  Specimens: nnone  Procedure: The patient was taken to the operating room where she underwent general anesthesia without difficulty. The patient was placed in a low lithotomy position using Allen stirrups. She was then prepped and draped in the normal sterile fashion. Sterile speculum was placed.  Cervical block was completed using 0.5% marcaine with epi.  Cervix was visualized- two small dimples were noted.  Initially the small indentation was thought to the be cervical opening.  On further examination it was noted that the cervix was scarred- a scalpel was used to lyse the adhesions with immediate return of dark brown blood consistent with the hematometra.  Cervical dilators were then used to 14 French to ensure entry into the canal.  Hemostasis was achieved at the tenaculum site with silver  nitrate and bleeding from the cervical os was anticipated and consistent with old menses.  All instrument were then removed. The patient was repositioned to the supine position. The patient tolerated the procedure without any complications and taken to recovery in stable condition.   Samantha Kapfer, DO Attending Obstetrician & Gynecologist, [facility] for [employer], [facility]

## 2023-12-18 NOTE — Anesthesia Procedure Notes (Signed)
 Procedure Name: LMA Insertion Date/Time: 12/18/2023 2:20 PM  Performed by: Para Jerelene CROME, CRNAPre-anesthesia Checklist: Patient identified, Emergency Drugs available, Suction available and Patient being monitored Patient Re-evaluated:Patient Re-evaluated prior to induction Oxygen Delivery Method: Circle system utilized Preoxygenation: Pre-oxygenation with 100% oxygen Induction Type: IV induction Ventilation: Mask ventilation without difficulty LMA: LMA inserted LMA Size: 4.0 Number of attempts: 1 Placement Confirmation: positive ETCO2, CO2 detector and breath sounds checked- equal and bilateral Tube secured with: Tape Dental Injury: Teeth and Oropharynx as per pre-operative assessment  Comments: Atraumatic insertion of LMA size 4. Lips ans teeth remain in preoperative condition.

## 2023-12-18 NOTE — Anesthesia Postprocedure Evaluation (Signed)
 Anesthesia Post Note  Patient: Samantha Rosales  Procedure(s) Performed: EXAM UNDER ANESTHESIA AND CERVICAL DILATION (Vagina )  Patient location during evaluation: PACU Anesthesia Type: General Level of consciousness: awake and alert Pain management: pain level controlled Vital Signs Assessment: post-procedure vital signs reviewed and stable Respiratory status: spontaneous breathing, nonlabored ventilation, respiratory function stable and patient connected to nasal cannula oxygen Cardiovascular status: blood pressure returned to baseline and stable Postop Assessment: no apparent nausea or vomiting Anesthetic complications: no   There were no known notable events for this encounter.   Last Vitals:  Vitals:   12/18/23 1502 12/18/23 1503  BP: (!) 165/93   Pulse: 93 90  Resp: 16 17  Temp: 36.6 C   SpO2: 100% 100%    Last Pain:  Vitals:   12/18/23 1503  PainSc: 0-No pain                 Nicklaus Alviar L Elizabethanne Lusher

## 2023-12-18 NOTE — Discharge Instructions (Addendum)
HOME INSTRUCTIONS  Please note any unusual or excessive bleeding, pain, swelling. Mild dizziness or drowsiness are normal for about 24 hours after surgery.   Shower when comfortable  Restrictions: No driving for 24 hours or while taking pain medications.  Activity:  No heavy lifting (> 10 lbs), nothing in vagina (no tampons, douching, or intercourse) x 2 weeks; no tub baths for 2 weeks Vaginal spotting is expected but if your bleeding is heavy, period like,  please call the office   Diet:  You may return to your regular diet.  Do not eat large meals.  Eat small frequent meals throughout the day.  Continue to drink a good amount of water at least 6-8 glasses of water per day, hydration is very important for the healing process.  Pain Management: Take over the counter tylenol or ibuprofen as needed for pain.  You can either take one or alternate between the two medications for pain management.  You may also use a heating pack as needed.    Alcohol -- Avoid for 24 hours and while taking pain medications.  Nausea: Take sips of ginger ale or soda  Fever -- Call physician if temperature over 101 degrees  Follow up:  If you do not already have a follow up appointment scheduled, please call the office at [phone].  If you experience fever (a temperature greater than 100.4), pain unrelieved by pain medication, shortness of breath, swelling of a single leg, or any other symptoms which are concerning to you please the office immediately.

## 2023-12-18 NOTE — Transfer of Care (Signed)
 Immediate Anesthesia Transfer of Care Note  Patient: Samantha Rosales  Procedure(s) Performed: EXAM UNDER ANESTHESIA AND CERVICAL DILATION (Vagina )  Patient Location: PACU  Anesthesia Type:General  Level of Consciousness: awake and patient cooperative  Airway & Oxygen Therapy: Patient Spontanous Breathing and Patient connected to nasal cannula oxygen  Post-op Assessment: Report given to RN and Post -op Vital signs reviewed and stable  Post vital signs: Reviewed and stable  Last Vitals:  Vitals Value Taken Time  BP 164/101 12/18/23 1446  Temp 36.6 C 12/18/23 1445  Pulse 100 12/18/23 1447  Resp 19 12/18/23 1447  SpO2 100 % 12/18/23 1447  Vitals shown include unfiled device data.  Last Pain:  Vitals:   12/18/23 1303  PainSc: 0-No pain         Complications: No notable events documented.

## 2023-12-30 ENCOUNTER — Emergency Department (HOSPITAL_COMMUNITY)
Admission: EM | Admit: 2023-12-30 | Discharge: 2023-12-30 | Disposition: A | Discharge status: Home / Self Care | Payer: BC Managed Care – PPO | Attending: Emergency Medicine | Admitting: Emergency Medicine

## 2023-12-30 ENCOUNTER — Emergency Department (HOSPITAL_COMMUNITY): Payer: BC Managed Care – PPO

## 2023-12-30 DIAGNOSIS — E876 Hypokalemia: Secondary | ICD-10-CM | POA: Insufficient documentation

## 2023-12-30 DIAGNOSIS — R531 Weakness: Secondary | ICD-10-CM | POA: Diagnosis not present

## 2023-12-30 DIAGNOSIS — R059 Cough, unspecified: Secondary | ICD-10-CM | POA: Diagnosis not present

## 2023-12-30 LAB — CBC WITH DIFFERENTIAL/PLATELET
Abs Immature Granulocytes: 0.04 10*3/uL (ref 0.00–0.07)
Basophils Absolute: 0 10*3/uL (ref 0.0–0.1)
Basophils Relative: 1 %
Eosinophils Absolute: 0 10*3/uL (ref 0.0–0.5)
Eosinophils Relative: 1 %
HCT: 25.2 % — ABNORMAL LOW (ref 36.0–46.0)
Hemoglobin: 8.5 g/dL — ABNORMAL LOW (ref 12.0–15.0)
Immature Granulocytes: 1 %
Lymphocytes Relative: 28 %
Lymphs Abs: 1.4 10*3/uL (ref 0.7–4.0)
MCH: 31.7 pg (ref 26.0–34.0)
MCHC: 33.7 g/dL (ref 30.0–36.0)
MCV: 94 fL (ref 80.0–100.0)
Monocytes Absolute: 0.6 10*3/uL (ref 0.1–1.0)
Monocytes Relative: 11 %
Neutro Abs: 3 10*3/uL (ref 1.7–7.7)
Neutrophils Relative %: 58 %
Platelets: 69 10*3/uL — ABNORMAL LOW (ref 150–400)
RBC: 2.68 MIL/uL — ABNORMAL LOW (ref 3.87–5.11)
RDW: 14.6 % (ref 11.5–15.5)
WBC: 5 10*3/uL (ref 4.0–10.5)
nRBC: 0 % (ref 0.0–0.2)

## 2023-12-30 LAB — URINALYSIS, ROUTINE W REFLEX MICROSCOPIC
Bilirubin Urine: NEGATIVE
Glucose, UA: NEGATIVE mg/dL
Ketones, ur: NEGATIVE mg/dL
Nitrite: NEGATIVE
Protein, ur: NEGATIVE mg/dL
Specific Gravity, Urine: 1.011 (ref 1.005–1.030)
pH: 6 (ref 5.0–8.0)

## 2023-12-30 LAB — COMPREHENSIVE METABOLIC PANEL
ALT: 33 U/L (ref 0–44)
AST: 105 U/L — ABNORMAL HIGH (ref 15–41)
Albumin: 3.3 g/dL — ABNORMAL LOW (ref 3.5–5.0)
Alkaline Phosphatase: 126 U/L (ref 38–126)
Anion gap: 14 (ref 5–15)
BUN: 17 mg/dL (ref 6–20)
CO2: 21 mmol/L — ABNORMAL LOW (ref 22–32)
Calcium: 7.5 mg/dL — ABNORMAL LOW (ref 8.9–10.3)
Chloride: 100 mmol/L (ref 98–111)
Creatinine, Ser: 0.76 mg/dL (ref 0.44–1.00)
GFR, Estimated: >60 mL/min (ref >60)
Glucose, Bld: 164 mg/dL — ABNORMAL HIGH (ref 70–99)
Potassium: 2.6 mmol/L — CL (ref 3.5–5.1)
Sodium: 135 mmol/L (ref 135–145)
Total Bilirubin: 0.8 mg/dL (ref 0.0–1.2)
Total Protein: 6.9 g/dL (ref 6.5–8.1)

## 2023-12-30 LAB — MAGNESIUM: Magnesium: 1.1 mg/dL — ABNORMAL LOW (ref 1.7–2.4)

## 2023-12-30 MED ORDER — POTASSIUM CHLORIDE CRYS ER 20 MEQ PO TBCR
40.0000 meq | EXTENDED_RELEASE_TABLET | Freq: Once | ORAL | Status: AC
  Administered 2023-12-30: 40 meq via ORAL
  Filled 2023-12-30: qty 2

## 2023-12-30 MED ORDER — SODIUM CHLORIDE 0.9 % IV BOLUS
1000.0000 mL | Freq: Once | INTRAVENOUS | Status: AC
  Administered 2023-12-30: 1000 mL via INTRAVENOUS

## 2023-12-30 MED ORDER — POTASSIUM CHLORIDE CRYS ER 20 MEQ PO TBCR: 20.0000 meq | EXTENDED_RELEASE_TABLET | Freq: Every day | ORAL | 0 refills | Status: DC

## 2023-12-30 MED ORDER — MAGNESIUM SULFATE 2 GM/50ML IV SOLN
2.0000 g | Freq: Once | INTRAVENOUS | Status: AC
  Administered 2023-12-30: 2 g via INTRAVENOUS
  Filled 2023-12-30: qty 50

## 2023-12-30 MED ORDER — POTASSIUM CHLORIDE 10 MEQ/100ML IV SOLN
10.0000 meq | INTRAVENOUS | Status: DC
  Administered 2023-12-30: 10 meq via INTRAVENOUS
  Filled 2023-12-30: qty 100

## 2023-12-30 MED ORDER — POTASSIUM CHLORIDE 10 MEQ/100ML IV SOLN
10.0000 meq | Freq: Once | INTRAVENOUS | Status: AC
  Administered 2023-12-30: 10 meq via INTRAVENOUS
  Filled 2023-12-30: qty 100

## 2023-12-30 NOTE — ED Provider Notes (Signed)
[place] EMERGENCY DEPARTMENT AT [facility] Provider Note   CSN: [id number] Arrival date & time: 12/30/23  7829     History  Chief Complaint  Patient presents with   Weakness    Samantha Rosales is a 43 y.o. female.  Patient here with generalized weakness.  Admits to drinking alcohol last night.  She is having some spasms in her legs.  She denies any weakness numbness tingling otherwise.  Denies any chest pain shortness of breath.  No nausea vomit diarrhea.  She feels like her electrolytes are off.  She denies any cough sputum production.  No abdominal pain.  The history is provided by the patient.       Home Medications Prior to Admission medications   Medication Sig Start Date End Date Taking? Authorizing Provider  acetaminophen (TYLENOL) 325 MG tablet Take 2 tablets (650 mg total) by mouth every 6 (six) hours as needed. Patient not taking: Reported on 09/03/2023 07/13/23   Tanda Rockers A, DO  CALCIUM PO Take 1 tablet by mouth daily.    [provider]  Cholecalciferol (VITAMIN D3 PO) Take 1 tablet by mouth daily.    [provider]  diazepam (VALIUM) 5 MG tablet Take once ~ prior to appointment 10/17/23   Myna Hidalgo, DO  escitalopram (LEXAPRO) 10 MG tablet Take 1 tablet (10 mg total) by mouth daily. 02/17/22   Donita Brooks, MD  fluticasone (FLONASE) 50 MCG/ACT nasal spray Place 2 sprays into both nostrils as needed for allergies or rhinitis.    [provider]  gabapentin (NEURONTIN) 300 MG capsule Take 300 mg by mouth daily as needed.    [provider]  hydrOXYzine (VISTARIL) 50 MG capsule Take 1 capsule (50 mg total) by mouth 3 (three) times daily as needed. Patient taking differently: Take 50 mg by mouth 3 (three) times daily as needed for itching or anxiety. 04/12/23   Del Nigel Berthold, FNP  ibuprofen (ADVIL) 600 MG tablet Take 1 tablet (600 mg total) by mouth every 6 (six) hours as needed. Take medication  with food to prevent upset stomach 10/17/23   Myna Hidalgo, DO  ibuprofen (ADVIL) 600 MG tablet Take 1 tablet (600 mg total) by mouth every 6 (six) hours as needed. 12/18/23   Myna Hidalgo, DO  iron polysaccharides (NU-IRON) 150 MG capsule Take 1 capsule (150 mg total) by mouth daily. 11/17/23   Del Nigel Berthold, FNP  losartan (COZAAR) 25 MG tablet TAKE 1 TABLET (25 MG TOTAL) BY MOUTH DAILY. 06/13/23   Del Nigel Berthold, FNP  MAGNESIUM PO Take 1 tablet by mouth daily.    [provider]  misoprostol (CYTOTEC) 200 MCG tablet Place once as instructed 10/17/23   Myna Hidalgo, DO  Multiple Vitamin (MULTIVITAMIN WITH MINERALS) TABS tablet Take 1 tablet by mouth daily.    [provider]  Multiple Vitamins-Minerals (ZINC PO) Take 1 tablet by mouth daily.    [provider]  norethindrone (AYGESTIN) 5 MG tablet Take by mouth daily.    [provider]  Omega 3 1000 MG CAPS Take 1 capsule (1,000 mg total) by mouth 2 (two) times daily. 11/17/23   Del Nigel Berthold, FNP  potassium chloride SA (KLOR-CON M) 20 MEQ tablet Take 1 tablet (20 mEq total) by mouth daily for 3 days. 12/30/23 01/02/24  Daizy Outen, DO  promethazine (PHENERGAN) 25 MG tablet Take 1 tablet (25 mg total) by mouth every 6 (six) hours  as needed for nausea or vomiting. 06/18/23   Adline Potter, NP      Allergies    Penicillins    Review of Systems   Review of Systems  Physical Exam Updated Vital Signs BP (!) 140/85 (BP Location: Left Arm)   Pulse 95   Temp 98.1 F (36.7 C)   Resp 15   Ht 5\' 8"  (1.727 m)   Wt 53.1 kg   LMP  (LMP Unknown) Comment: patient states she had her last period 6 years ago. urine preg.negative on 12/13/23  SpO2 100%   BMI 17.79 kg/m  Physical Exam Vitals and nursing note reviewed.  Constitutional:      General: She is not in acute distress.    Appearance: She is well-developed. She is not ill-appearing.  HENT:     Head: Normocephalic  and atraumatic.     Nose: Nose normal.     Mouth/Throat:     Mouth: Mucous membranes are moist.  Eyes:     Extraocular Movements: Extraocular movements intact.     Conjunctiva/sclera: Conjunctivae normal.     Pupils: Pupils are equal, round, and reactive to light.  Cardiovascular:     Rate and Rhythm: Normal rate and regular rhythm.     Pulses: Normal pulses.     Heart sounds: Normal heart sounds. No murmur heard. Pulmonary:     Effort: Pulmonary effort is normal. No respiratory distress.     Breath sounds: Normal breath sounds.  Abdominal:     Palpations: Abdomen is soft.     Tenderness: There is no abdominal tenderness.  Musculoskeletal:        General: No swelling.     Cervical back: Normal range of motion and neck supple.  Skin:    General: Skin is warm and dry.     Capillary Refill: Capillary refill takes less than 2 seconds.  Neurological:     General: No focal deficit present.     Mental Status: She is alert.  Psychiatric:        Mood and Affect: Mood normal.     ED Results / Procedures / Treatments   Labs (all labs ordered are listed, but only abnormal results are displayed) Labs Reviewed  CBC WITH DIFFERENTIAL/PLATELET - Abnormal; Notable for the following components:      Result Value   RBC 2.68 (*)    Hemoglobin 8.5 (*)    HCT 25.2 (*)    Platelets 69 (*)    All other components within normal limits  COMPREHENSIVE METABOLIC PANEL - Abnormal; Notable for the following components:   Potassium 2.6 (*)    CO2 21 (*)    Glucose, Bld 164 (*)    Calcium 7.5 (*)    Albumin 3.3 (*)    AST 105 (*)    All other components within normal limits  URINALYSIS, ROUTINE W REFLEX MICROSCOPIC - Abnormal; Notable for the following components:   Hgb urine dipstick SMALL (*)    Leukocytes,Ua TRACE (*)    Bacteria, UA RARE (*)    All other components within normal limits  MAGNESIUM - Abnormal; Notable for the following components:   Magnesium 1.1 (*)    All other  components within normal limits    EKG EKG Interpretation Date/Time:  Sunday December 30 2023 12:18:53 EST Ventricular Rate:  109 PR Interval:  119 QRS Duration:  78 QT Interval:  380 QTC Calculation: 512 R Axis:   63  Text Interpretation: Sinus tachycardia Confirmed by  Virgina Norfolk [phone]) on 12/30/2023 12:24:47 PM  Radiology DG Chest Portable 1 View Result Date: 12/30/2023 CLINICAL DATA:  Cough. EXAM: PORTABLE CHEST 1 VIEW COMPARISON:  07/13/2023 FINDINGS: The heart size and mediastinal contours are within normal limits. Both lungs are clear. The visualized skeletal structures are unremarkable. IMPRESSION: No active disease. Electronically Signed   By: Kennith Center M.D.   On: 12/30/2023 10:54    Procedures Procedures    Medications Ordered in ED Medications  sodium chloride 0.9 % bolus 1,000 mL (1,000 mLs Intravenous New Bag/Given 12/30/23 1033)  potassium chloride SA (KLOR-CON M) CR tablet 40 mEq (40 mEq Oral Given 12/30/23 1142)  magnesium sulfate IVPB 2 g 50 mL (2 g Intravenous New Bag/Given 12/30/23 1142)  potassium chloride 10 mEq in 100 mL IVPB (10 mEq Intravenous New Bag/Given 12/30/23 1200)    ED Course/ Medical Decision Making/ A&P                                 Medical Decision Making Amount and/or Complexity of Data Reviewed Labs: ordered. Radiology: ordered.  Risk Prescription drug management.   Audrie Lia Cavan is here with some generalized weakness and cramps.  History of low potassium.  History of diabetes.  Normal vitals.  No fever.  Does admit to alcohol use last night.  She does not appear to be having any withdrawals.  She is unremarkable vitals.  She has normal neurological exam.  She is ambulatory.  Differential diagnosis likely dehydration, electrolyte abnormality.  Will check CBC BMP urinalysis chest x-ray.  EKG shows sinus rhythm.  No ischemic changes.  Magnesium was 1.1.  Potassium 2.6.  Lab work otherwise unremarkable.  Magnesium and potassium  was repleted.  She was given IV fluids.  Overall suspect electrolyte abnormalities likely in the setting of dehydration and alcohol use.  Discharged in good condition.  Understands return precautions.  Will have her take potassium at home.  This chart was dictated using voice recognition software.  Despite best efforts to proofread,  errors can occur which can change the documentation meaning.         Final Clinical Impression(s) / ED Diagnoses Final diagnoses:  Weakness  Hypokalemia    Rx / DC Orders ED Discharge Orders          Ordered    potassium chloride SA (KLOR-CON M) 20 MEQ tablet  Daily        12/30/23 1333              Virgina Norfolk, DO 12/30/23 1334

## 2023-12-30 NOTE — ED Triage Notes (Signed)
Patient BIB EMS with complaint of tremors in lower extremities for over 1 month, has not been taking meds for blood pressure and anxiety over last month.Consumed alcohol last night x 4 shots, No ETOH this am per patient.  CBG 300 per EMS. Patient is pre diabetic. JRPRN

## 2024-01-01 NOTE — Telephone Encounter (Signed)
Copied from CRM [id number]. Topic: Clinical - Medication Question >> Jan 01, 2024 10:49 AM Shelah Lewandowsky wrote: Reason for CRM: Need someone to go over med list and advise what each is for and make sure everything is up to date, please [phone]

## 2024-01-03 ENCOUNTER — Encounter: Payer: 59 | Admitting: Obstetrics & Gynecology

## 2024-01-08 DIAGNOSIS — H5213 Myopia, bilateral: Secondary | ICD-10-CM | POA: Diagnosis not present

## 2024-01-09 ENCOUNTER — Other Ambulatory Visit: Payer: Self-pay | Admitting: Family Medicine

## 2024-01-09 DIAGNOSIS — I10 Essential (primary) hypertension: Secondary | ICD-10-CM

## 2024-01-09 NOTE — Patient Instructions (Signed)

## 2024-01-09 NOTE — Progress Notes (Signed)
 Established Patient Office Visit   Subjective  Patient ID: Samantha Rosales, female    DOB: [birth year]  Age: 43 y.o. MRN: [id number]  Chief Complaint  Patient presents with   Hospitalization Follow-up    ED follow up for low potassium  Would like to review medication she is not sure what to take  Trouble sleeping.     She  has a past medical history of Abnormal Pap smear, Alcohol use, Allergic rhinitis, BV (bacterial vaginosis), Closed left ankle fracture, GERD (gastroesophageal reflux disease), Hepatitis, High cholesterol, HSV-2 (herpes simplex virus 2) infection, Hypertension, Hypothyroidism (10/17/2019), Pancreatitis, Rheumatoid arthritis (HCC), Seizures (HCC), and Vaginal Pap smear, abnormal.  HPI Patient presents to the clinic for ED follow up for hypokalemia. For the details of today's visit, please refer to assessment and plan.   Review of Systems  Constitutional:  Negative for chills and fever.  Eyes:  Negative for blurred vision.  Respiratory:  Negative for shortness of breath.   Cardiovascular:  Negative for chest pain.  Neurological:  Negative for dizziness and headaches.      Objective:     BP (!) 150/89   Pulse 86   Ht 5' 8 (1.727 m)   Wt 112 lb 1.9 oz (50.9 kg)   LMP  (LMP Unknown) Comment: patient states she had her last period 6 years ago. urine preg.negative on 12/13/23  SpO2 99%   BMI 17.05 kg/m  BP Readings from Last 3 Encounters:  01/10/24 (!) 150/89  12/30/23 135/80  12/18/23 (!) 165/93      Physical Exam Vitals reviewed.  Constitutional:      General: She is not in acute distress.    Appearance: Normal appearance. She is not ill-appearing, toxic-appearing or diaphoretic.  HENT:     Head: Normocephalic.  Eyes:     General:        Right eye: No discharge.        Left eye: No discharge.     Conjunctiva/sclera: Conjunctivae normal.  Cardiovascular:     Rate and Rhythm: Normal rate.     Pulses: Normal pulses.     Heart sounds: Normal heart  sounds.  Pulmonary:     Effort: Pulmonary effort is normal. No respiratory distress.     Breath sounds: Normal breath sounds.  Abdominal:     General: Bowel sounds are normal.  Skin:    General: Skin is warm and dry.     Capillary Refill: Capillary refill takes less than 2 seconds.  Neurological:     Mental Status: She is alert.     Gait: Gait normal.  Psychiatric:        Mood and Affect: Mood normal.        Behavior: Behavior normal.      No results found for any visits on 01/10/24.  The ASCVD Risk score (Arnett DK, et al., 2019) failed to calculate for the following reasons:   The valid total cholesterol range is 130 to 320 mg/dL    Assessment & Plan:  Hypokalemia Assessment & Plan: ED lab previous reading Potassium 2.6 Patient completed 3 tablets of Potassium  Repeat panel done today- awaiting results will follow up. I encouraged to eat rich potassium foods such as, apricots, avocados, apples, bananas, spinach, kale, tomatoes, carrots ans potatoes.   Orders: -     BMP8+eGFR  Iron  deficiency anemia, unspecified iron  deficiency anemia type Assessment & Plan: Labs ordered Continue Nu-150 mg once daily I advise to consume iron  rich  foods such as Red meat, pork, poultry, seafood, beans, dark green leafy vegetables, such as spinach, dried fruit, such as raisins and apricots. Iron -fortified cereals, and peas.    Orders: -     CBC with Differential/Platelet -     Iron , TIBC and Ferritin Panel -     B12 and Folate Panel  Primary insomnia Assessment & Plan: Trial on Trazodone  50 mg at bedtime  Explained to go to bed at the same time each night and get up at the same time each morning, including on the weekends. Make sure your bedroom is quiet, dark, relaxing, and at a comfortable temperature. Remove electronic devices, such as TVs, computers, and smart phones, from the bedroom.   Orders: -     traZODone  HCl; Take 1 tablet (50 mg total) by mouth at bedtime as needed.   Dispense: 30 tablet; Refill: 3  Primary hypertension Assessment & Plan: Not controlled in today's visit Patient reported not taking her medication for a few days I explained the importance of medication compliance to prevent cardiac complications Advise to follow up in 2 weeks via my chart with at home blood pressure readings to monitor trends Patient agreered to re-start taking Losartan  25 mg once daily     Return if symptoms worsen or fail to improve.   Hilario Kidd Wilhelmena Falter, FNP

## 2024-01-10 ENCOUNTER — Ambulatory Visit (INDEPENDENT_AMBULATORY_CARE_PROVIDER_SITE_OTHER): Payer: 59 | Admitting: Family Medicine

## 2024-01-10 ENCOUNTER — Encounter: Payer: Self-pay | Admitting: Family Medicine

## 2024-01-10 VITALS — BP 150/89 | HR 86 | Ht 68.0 in | Wt 112.1 lb

## 2024-01-10 DIAGNOSIS — I10 Essential (primary) hypertension: Secondary | ICD-10-CM

## 2024-01-10 DIAGNOSIS — F5101 Primary insomnia: Secondary | ICD-10-CM | POA: Diagnosis not present

## 2024-01-10 DIAGNOSIS — D509 Iron deficiency anemia, unspecified: Secondary | ICD-10-CM | POA: Diagnosis not present

## 2024-01-10 DIAGNOSIS — E876 Hypokalemia: Secondary | ICD-10-CM | POA: Diagnosis not present

## 2024-01-10 MED ORDER — TRAZODONE HCL 50 MG PO TABS: 50.0000 mg | ORAL_TABLET | Freq: Every evening | ORAL | 3 refills | Status: DC | PRN

## 2024-01-10 NOTE — Assessment & Plan Note (Addendum)
 ED lab previous reading Potassium 2.6 Patient completed 3 tablets of Potassium  Repeat panel done today- awaiting results will follow up. I encouraged to eat rich potassium foods such as, apricots, avocados, apples, bananas, spinach, kale, tomatoes, carrots ans potatoes.

## 2024-01-10 NOTE — Assessment & Plan Note (Signed)
 Labs ordered Continue Nu-150 mg once daily I advise to consume iron  rich foods such as Red meat, pork, poultry, seafood, beans, dark green leafy vegetables, such as spinach, dried fruit, such as raisins and apricots. Iron -fortified cereals, and peas.

## 2024-01-10 NOTE — Assessment & Plan Note (Signed)
 Not controlled in today's visit Patient reported not taking her medication for a few days I explained the importance of medication compliance to prevent cardiac complications Advise to follow up in 2 weeks via my chart with at home blood pressure readings to monitor trends Patient agreered to re-start taking Losartan  25 mg once daily

## 2024-01-10 NOTE — Assessment & Plan Note (Signed)
 Trial on Trazodone 50 mg at bedtime Explained to go to bed at the same time each night and get up at the same time each morning, including on the weekends. Make sure your bedroom is quiet, dark, relaxing, and at a comfortable temperature. Remove electronic devices, such as TVs, computers, and smart phones, from the bedroom.

## 2024-01-25 DIAGNOSIS — H469 Unspecified optic neuritis: Secondary | ICD-10-CM | POA: Diagnosis not present

## 2024-01-25 DIAGNOSIS — M0579 Rheumatoid arthritis with rheumatoid factor of multiple sites without organ or systems involvement: Secondary | ICD-10-CM | POA: Diagnosis not present

## 2024-01-25 DIAGNOSIS — H3581 Retinal edema: Secondary | ICD-10-CM | POA: Diagnosis not present

## 2024-01-31 ENCOUNTER — Ambulatory Visit: Payer: Self-pay | Admitting: Family Medicine

## 2024-02-01 ENCOUNTER — Other Ambulatory Visit: Payer: Self-pay | Admitting: Family Medicine

## 2024-02-01 DIAGNOSIS — F5101 Primary insomnia: Secondary | ICD-10-CM

## 2024-02-14 DIAGNOSIS — D509 Iron deficiency anemia, unspecified: Secondary | ICD-10-CM | POA: Diagnosis not present

## 2024-02-14 DIAGNOSIS — E876 Hypokalemia: Secondary | ICD-10-CM | POA: Diagnosis not present

## 2024-02-15 ENCOUNTER — Encounter: Payer: Self-pay | Admitting: Family Medicine

## 2024-02-15 LAB — CBC WITH DIFFERENTIAL/PLATELET
Basophils Absolute: 0 10*3/uL (ref 0.0–0.2)
Basos: 1 %
EOS (ABSOLUTE): 0.1 10*3/uL (ref 0.0–0.4)
Eos: 1 %
Hematocrit: 30.4 % — ABNORMAL LOW (ref 34.0–46.6)
Hemoglobin: 9.9 g/dL — ABNORMAL LOW (ref 11.1–15.9)
Immature Grans (Abs): 0 10*3/uL (ref 0.0–0.1)
Immature Granulocytes: 0 %
Lymphocytes Absolute: 1.8 10*3/uL (ref 0.7–3.1)
Lymphs: 49 %
MCH: 32.1 pg (ref 26.6–33.0)
MCHC: 32.6 g/dL (ref 31.5–35.7)
MCV: 99 fL — ABNORMAL HIGH (ref 79–97)
Monocytes Absolute: 0.6 10*3/uL (ref 0.1–0.9)
Monocytes: 16 %
Neutrophils Absolute: 1.2 10*3/uL — ABNORMAL LOW (ref 1.4–7.0)
Neutrophils: 33 %
Platelets: 211 10*3/uL (ref 150–450)
RBC: 3.08 x10E6/uL — ABNORMAL LOW (ref 3.77–5.28)
RDW: 13.6 % (ref 11.7–15.4)
WBC: 3.6 10*3/uL (ref 3.4–10.8)

## 2024-02-15 LAB — B12 AND FOLATE PANEL
Folate: >20 ng/mL (ref >3.0)
Vitamin B-12: 474 pg/mL (ref 232–1245)

## 2024-02-15 LAB — BMP8+EGFR
BUN/Creatinine Ratio: 27 — ABNORMAL HIGH (ref 9–23)
BUN: 18 mg/dL (ref 6–24)
CO2: 16 mmol/L — ABNORMAL LOW (ref 20–29)
Calcium: 9 mg/dL (ref 8.7–10.2)
Chloride: 107 mmol/L — ABNORMAL HIGH (ref 96–106)
Creatinine, Ser: 0.66 mg/dL (ref 0.57–1.00)
Glucose: 99 mg/dL (ref 70–99)
Potassium: 4.4 mmol/L (ref 3.5–5.2)
Sodium: 138 mmol/L (ref 134–144)
eGFR: 112 mL/min/{1.73_m2} (ref >59)

## 2024-02-15 LAB — IRON,TIBC AND FERRITIN PANEL
Ferritin: 303 ng/mL — ABNORMAL HIGH (ref 15–150)
Iron Saturation: 39 % (ref 15–55)
Iron: 126 ug/dL (ref 27–159)
Total Iron Binding Capacity: 321 ug/dL (ref 250–450)
UIBC: 195 ug/dL (ref 131–425)

## 2024-02-22 DIAGNOSIS — H469 Unspecified optic neuritis: Secondary | ICD-10-CM | POA: Diagnosis not present

## 2024-02-25 ENCOUNTER — Telehealth: Payer: Self-pay | Admitting: Family Medicine

## 2024-02-25 NOTE — Telephone Encounter (Unsigned)
 Copied from CRM [id number]. Topic: General - Other >> Feb 21, 2024  2:13 PM Carlatta H wrote: Reason for CRM: Patient needs [place] from [place] stating to please make sure she takes her medicine//It should be addressed to the [facility] department//Please call patient once ready for pick up

## 2024-02-28 ENCOUNTER — Other Ambulatory Visit: Payer: Self-pay | Admitting: Family Medicine

## 2024-02-28 NOTE — Telephone Encounter (Signed)
 Dear [facility] Department,  I am writing to confirm that Samantha Rosales is actively taking the following prescribed medications as directed by their healthcare providers. Below is a list of medications, including dosage  Acetaminophen (TYLENOL) 325 MG Tablet - 650 mg, every 6 hours as needed for pain  Calcium PO - 1 tablet, daily  Cholecalciferol (VITAMIN D3 PO) - 1 tablet, daily  Escitalopram (LEXAPRO) 10 MG Tablet - 10 mg, daily  Fluticasone (FLONASE) 50 MCG/ACT Nasal Spray - 2 sprays, as needed  Gabapentin (NEURONTIN) 300 MG Capsule - 300 mg, daily as needed   Iron Polysaccharides (NU-IRON) 150 MG Capsule - 150 mg, daily  Lipase/Protease/Amylase (CREON) 12000-38000 Units CPEP Capsule - 12,000 units, 3 times daily before meals  Losartan (COZAAR) 25 MG Tablet - 25 mg, daily  Magnesium PO - 1 tablet, daily  Multiple Vitamin (MULTIVITAMIN WITH MINERALS) TABS Tablet - 1 tablet, daily  Norethindrone (AYGESTIN) 5 MG Tablet - 1 tablet, daily  Omega 3 1000 MG CAPS - 1 capsule, twice daily  Trazodone (DESYREL) 50 MG Tablet at bedtime for sleep.  Samantha Rosales takes these medications as prescribed to manage their health conditions. If you require any further information or verification, please do not hesitate to contact me.  Sincerely, Thurman Sarver Del Newman Nip, MSN, APRN, FNP-C

## 2024-02-28 NOTE — Telephone Encounter (Signed)
 Patient needs to contact the provider who started her on this medication

## 2024-02-28 NOTE — Telephone Encounter (Signed)
 Copied from CRM [id number]. Topic: Clinical - Medication Question >> Feb 28, 2024 11:07 AM Marland Kitchen D wrote: Patient wants to know if she can get this refilled it was prescribed by a different doctor.  needs refill on Creon- dosage- 2 capsule by mouth with meals 36,000 units  Preferred Pharmacy-  [id number] [place], [place] - [address] [place] [address] Phone: [phone] Fax: [phone] Hours: Not open 24 hours

## 2024-02-29 ENCOUNTER — Encounter: Payer: Self-pay | Admitting: Family Medicine

## 2024-02-29 ENCOUNTER — Telehealth: Payer: Self-pay | Admitting: Family Medicine

## 2024-02-29 ENCOUNTER — Other Ambulatory Visit: Payer: Self-pay | Admitting: Family Medicine

## 2024-02-29 DIAGNOSIS — N946 Dysmenorrhea, unspecified: Secondary | ICD-10-CM

## 2024-02-29 DIAGNOSIS — H5213 Myopia, bilateral: Secondary | ICD-10-CM | POA: Diagnosis not present

## 2024-02-29 DIAGNOSIS — F5101 Primary insomnia: Secondary | ICD-10-CM

## 2024-02-29 DIAGNOSIS — I10 Essential (primary) hypertension: Secondary | ICD-10-CM

## 2024-02-29 MED ORDER — IBUPROFEN 600 MG PO TABS: 600.0000 mg | ORAL_TABLET | Freq: Four times a day (QID) | ORAL | 1 refills | Status: DC | PRN

## 2024-02-29 MED ORDER — OMEGA 3 1000 MG PO CAPS: 1.0000 | ORAL_CAPSULE | Freq: Two times a day (BID) | ORAL | 3 refills | Status: AC

## 2024-02-29 MED ORDER — ACETAMINOPHEN 325 MG PO TABS: 650.0000 mg | ORAL_TABLET | Freq: Four times a day (QID) | ORAL | 0 refills | Status: AC | PRN

## 2024-02-29 MED ORDER — ESCITALOPRAM OXALATE 10 MG PO TABS: 10.0000 mg | ORAL_TABLET | Freq: Every day | ORAL | 3 refills | Status: AC

## 2024-02-29 MED ORDER — LOSARTAN POTASSIUM 25 MG PO TABS: 25.0000 mg | ORAL_TABLET | Freq: Every day | ORAL | 1 refills | Status: DC

## 2024-02-29 MED ORDER — VITAMIN D3 25 MCG (1000 UNIT) PO TABS: 1000.0000 [IU] | ORAL_TABLET | Freq: Every day | ORAL | 1 refills | Status: AC

## 2024-02-29 MED ORDER — POLYSACCHARIDE IRON COMPLEX 150 MG PO CAPS: 150.0000 mg | ORAL_CAPSULE | Freq: Every day | ORAL | 3 refills | Status: AC

## 2024-02-29 MED ORDER — GABAPENTIN 300 MG PO CAPS: 300.0000 mg | ORAL_CAPSULE | Freq: Every day | ORAL | 3 refills | Status: DC | PRN

## 2024-02-29 MED ORDER — FLUTICASONE PROPIONATE 50 MCG/ACT NA SUSP: 2.0000 | NASAL | 2 refills | Status: DC | PRN

## 2024-02-29 MED ORDER — TRAZODONE HCL 50 MG PO TABS: 50.0000 mg | ORAL_TABLET | Freq: Every day | ORAL | 2 refills | Status: AC

## 2024-02-29 NOTE — Telephone Encounter (Signed)
 Patient came by the office to collect letter   Wants a call back in regard to med refills  Wants to know if provider will be sending in said medications

## 2024-02-29 NOTE — Telephone Encounter (Signed)
 Hi, Refills sent  Samantha Rosales- was the letter sent to patient through my chart?

## 2024-02-29 NOTE — Telephone Encounter (Signed)
 Copied from CRM [id number]. Topic: Referral - Question >> Feb 28, 2024 11:01 AM Marland Kitchen D wrote: Patient says she needs to see a foot doctor and needs a referral  to ear, nose and throat doctor.

## 2024-03-03 NOTE — Telephone Encounter (Signed)
Placard  Noted  Copied Sleeved  Original in PCP box Copy front desk folder

## 2024-03-06 NOTE — Telephone Encounter (Signed)
Yw! 

## 2024-03-06 NOTE — Telephone Encounter (Signed)
 Yes, the letter was sent thru My chart.

## 2024-03-06 NOTE — Telephone Encounter (Signed)
 Thank you :)

## 2024-03-11 NOTE — Telephone Encounter (Signed)
 Patient came by office to check status on Placard forms. Patient informed provided back in office on Wednesday 4/9 Wants a call back when form is completed.

## 2024-03-12 NOTE — Telephone Encounter (Addendum)
 Patient checking again if forms are complete- informed they are not will call when ready

## 2024-03-13 ENCOUNTER — Other Ambulatory Visit: Payer: Self-pay | Admitting: Family Medicine

## 2024-03-17 NOTE — Telephone Encounter (Signed)
 Forms have been completed. Scanned to center. Pt. Notified and form is at front desk for pick up.

## 2024-04-09 ENCOUNTER — Ambulatory Visit: Payer: Self-pay

## 2024-04-09 NOTE — Telephone Encounter (Signed)
 Copied from CRM [id number]. Topic: Clinical - Red Word Triage >> Apr 09, 2024 10:36 AM Rosamond Comes wrote: Red Word that prompted transfer to Nurse Triage: patient aunt Glenora Laos calling in, patient okayed to speak with aunt. Patient, starting last week she is sick to stomach, vomiting, legs week, diarrhea, nausea   Chief Complaint: Vomiting  Symptoms: Vomiting, diarrhea  Frequency: Intermittent  Pertinent Negatives: Patient denies abdominal pain  Disposition: [] ED /[] Urgent Care (no appt availability in office) / [x] Appointment(In office/virtual)/ []  [facility]/ [] Home Care/ [] Refused Recommended Disposition /[] [facility]/ []  Follow-up with PCP Additional Notes: Patient's aunt called with the patient stating that the patient has had vomiting and diarrhea that began last week. She states that the patient is also complaining of mild weakness in her legs. Patient denies any abdominal pain. Appointment made for the patient tomorrow for evaluation.      Reason for Disposition  [1] MILD vomiting with diarrhea AND [2] present > 5 days  Answer Assessment - Initial Assessment Questions 1. VOMITING SEVERITY: "How many times have you vomited in the past 24 hours?"     - MILD:  1 - 2 times/day    - MODERATE: 3 - 5 times/day, decreased oral intake without significant weight loss or symptoms of dehydration    - SEVERE: 6 or more times/day, vomits everything or nearly everything, with significant weight loss, symptoms of dehydration      5 times  2. ONSET: "When did the vomiting begin?"      1 week 3. FLUIDS: "What fluids or food have you vomited up today?" "Have you been able to keep any fluids down?"     Vomiting fluids  4. ABDOMEN PAIN: "Are your having any abdomen pain?" If Yes : "How bad is it and what does it feel like?" (e.g., crampy, dull, intermittent, constant)      No 5. DIARRHEA: "Is there any diarrhea?" If Yes, ask: "How many times today?"      Yes, twice  6. CONTACTS:  "Is there anyone else in the family with the same symptoms?"      No 7. CAUSE: "What do you think is causing your vomiting?"     Unsure  8. HYDRATION STATUS: "Any signs of dehydration?" (e.g., dry mouth [not only dry lips], too weak to stand) "When did you last urinate?"     Some  9. OTHER SYMPTOMS: "Do you have any other symptoms?" (e.g., fever, headache, vertigo, vomiting blood or coffee grounds, recent head injury)     Weakness in legs  10. PREGNANCY: "Is there any chance you are pregnant?" "When was your last menstrual period?"       No  Protocols used: Vomiting-A-AH

## 2024-04-09 NOTE — Telephone Encounter (Signed)
 Copied from CRM [id number]. Topic: Clinical - Red Word Triage >> Apr 09, 2024 10:36 AM Rosamond Comes wrote: Red Word that prompted transfer to Nurse Triage: patient aunt Glenora Laos calling in, patient okayed to speak with aunt. Patient, starting last week she is sick to stomach, vomiting, legs week, diarrhea, nausea  Symptoms: Nausea, Diarrhea, Weakness   Disposition: [] ED /[] Urgent Care (no appt availability in office) / [] Appointment(In office/virtual)/ []  [facility]/ [] Home Care/ [] Refused Recommended Disposition /[] [facility]/ []  Follow-up with PCP Additional Notes: LH was dropped in between calls, and on attempt to get the patient back on the line, there was no answer, left a message.

## 2024-04-10 ENCOUNTER — Ambulatory Visit (INDEPENDENT_AMBULATORY_CARE_PROVIDER_SITE_OTHER): Admitting: Internal Medicine

## 2024-04-10 ENCOUNTER — Ambulatory Visit: Payer: Self-pay

## 2024-04-10 ENCOUNTER — Encounter: Payer: Self-pay | Admitting: Internal Medicine

## 2024-04-10 VITALS — BP 90/68 | HR 115 | Ht 68.0 in | Wt 99.2 lb

## 2024-04-10 DIAGNOSIS — R110 Nausea: Secondary | ICD-10-CM | POA: Diagnosis not present

## 2024-04-10 DIAGNOSIS — J0110 Acute frontal sinusitis, unspecified: Secondary | ICD-10-CM

## 2024-04-10 DIAGNOSIS — R040 Epistaxis: Secondary | ICD-10-CM | POA: Diagnosis not present

## 2024-04-10 DIAGNOSIS — K529 Noninfective gastroenteritis and colitis, unspecified: Secondary | ICD-10-CM | POA: Insufficient documentation

## 2024-04-10 MED ORDER — ONDANSETRON HCL 4 MG PO TABS: 4.0000 mg | ORAL_TABLET | Freq: Three times a day (TID) | ORAL | 1 refills | Status: DC | PRN

## 2024-04-10 MED ORDER — AZITHROMYCIN 250 MG PO TABS: ORAL_TABLET | ORAL | 0 refills | Status: AC

## 2024-04-10 NOTE — Telephone Encounter (Signed)
 Patient's aunt called today and was transferred to Nurse Triage due to frustration of yesterday's nurse triage resulting in an appt for tomorrow, yet the patient was told her appt was today. Aunt states "this is not our fault. We were told the appointment was for today and I told them I needed something today because I am bringing my brother up there for his appt at 2 and she needs to be seen. So I will be bringing her today". This RN connected patient with CAL to assist with further scheduling, per request of CAL.

## 2024-04-10 NOTE — Patient Instructions (Addendum)
 Please start taking Azithromycin  as prescribed.  Please use Flonase  for nasal congestion/allergies.  Please take Zofran  as needed for nausea.  Please follow up with GI for chronic diarrhea.

## 2024-04-10 NOTE — Assessment & Plan Note (Signed)
 History of chronic diarrhea, questionable diagnosis of chronic pancreatitis, was given Creon  once in the past Referred to GI for further evaluation Needs to maintain adequate hydration and eat at regular intervals

## 2024-04-10 NOTE — Assessment & Plan Note (Addendum)
 Has history of epistaxis in the past, currently has blood-tinged crusting nosebleeds with mucosal edema Referred to ENT specialist Advised to use Flonase  regularly

## 2024-04-10 NOTE — Assessment & Plan Note (Signed)
 Zofran  as needed for nausea Needs to maintain adequate hydration

## 2024-04-10 NOTE — Telephone Encounter (Signed)
 Patient scheduled today with Dr Lydia Sams

## 2024-04-10 NOTE — Progress Notes (Signed)
 Acute Office Visit  Subjective:    Patient ID: Samantha Rosales, female    DOB: 1981-04-20, 43 y.o.   MRN: [id number]  Chief Complaint  Patient presents with   Diarrhea    Pt reports sx of vomitting and diarrhea also having some ear discomfort ongoing for 7 days.     HPI Patient is in today for complaint of vomiting and diarrhea, which are chronic concerns.  She reports intermittent diarrhea for the last 1 week after eating it [employer].  Later, she reports that she has had loose bowel movements chronically as well.  She used to take Creon , but does not have a confirmed diagnosis of chronic pancreatitis.  She has not seen GI provider recently.  Denies melena or hematochezia.  She reports nasal congestion, postnasal drip and bilateral ear fullness for the last 1 week.  She has mild chills, but denies any fever.  She has tried taking OTC antihistaminics without any relief.  She also has Flonase , but has not used it recently.  She also reports bilateral nasal soreness and blood-tinged crusting on recurrent basis.  Past Medical History:  Diagnosis Date   Abnormal Pap smear    Alcohol use    Allergic rhinitis    BV (bacterial vaginosis)    Closed left ankle fracture    GERD (gastroesophageal reflux disease)    Hepatitis    LFT's >1000 (2019) due to methotrexate vs alcohol   High cholesterol    HSV-2 (herpes simplex virus 2) infection    Hypertension    Hypothyroidism 10/17/2019   Pancreatitis    12/19 (thought to be due to alcohol)   Rheumatoid arthritis (HCC)    Seizures (HCC)    dx as child with epilepsy but no seizures since 89 and on no meds   Vaginal Pap smear, abnormal     Past Surgical History:  Procedure Laterality Date   arm fracture     CERVICAL CONIZATION W/BX  12/20/2012   Procedure: CONIZATION CERVIX WITH BIOPSY;  Surgeon: Wendelyn Halter, MD;  Location: [facility];  Service: Gynecology;  Laterality: N/A;   FRACTURE SURGERY Left 2024   left arm   FRACTURE SURGERY Left  2022   left fracture   HOLMIUM LASER APPLICATION  12/20/2012   Procedure: HOLMIUM LASER APPLICATION;  Surgeon: Wendelyn Halter, MD;  Location: [facility];  Service: Gynecology;  Laterality: N/A;    Family History  Problem Relation Age of Onset   Hypertension Mother    Hypertension Father    Pancreatic cancer Paternal Uncle    Colon cancer Neg Hx    Esophageal cancer Neg Hx    Inflammatory bowel disease Neg Hx    Liver disease Neg Hx    Rectal cancer Neg Hx    Stomach cancer Neg Hx     Social History   Socioeconomic History   Marital status: Married    Spouse name: Not on file   Number of children: 2   Years of education: Not on file   Highest education level: Associate degree: occupational, Scientist, product/process development, or vocational program  Occupational History   Occupation: [employer]    Employer: [employer]   Tobacco Use   Smoking status: Every Day    Current packs/day: 0.50    Average packs/day: 0.5 packs/day for 15.0 years (7.5 ttl pk-yrs)    Types: Cigarettes   Smokeless tobacco: Never  Vaping Use   Vaping status: Never Used  Substance and Sexual Activity  Alcohol use: Yes    Alcohol/week: 31.0 standard drinks of alcohol    Types: 3 Cans of beer, 28 Standard drinks or equivalent per week    Comment: 1-3 per day depending upon what is going on [facility])   Drug use: No   Sexual activity: Not Currently    Birth control/protection: Condom  Other Topics Concern   Not on file  Social History Narrative   Not on file   Social Drivers of Health   Financial Resource Strain: Medium Risk (01/09/2024)   Overall Financial Resource Strain (CARDIA)    Difficulty of Paying Living Expenses: Somewhat hard  Food Insecurity: No Food Insecurity (01/09/2024)   Hunger Vital Sign    Worried About Running Out of Food in the Last Year: Never true    Ran Out of Food in the Last Year: Never true  Transportation Needs: No Transportation Needs (01/09/2024)   PRAPARE - Therapist, art (Medical): No    Lack of Transportation (Non-Medical): No  Physical Activity: Inactive (01/09/2024)   Exercise Vital Sign    Days of Exercise per Week: 0 days    Minutes of Exercise per Session: 0 min  Stress: Stress Concern Present (01/09/2024)   [employer] of Occupational Health - Occupational Stress Questionnaire    Feeling of Stress : To some extent  Social Connections: Moderately Isolated (01/09/2024)   Social Connection and Isolation Panel [NHANES]    Frequency of Communication with Friends and Family: More than three times a week    Frequency of Social Gatherings with Friends and Family: Twice a week    Attends Religious Services: Never    Database administrator or Organizations: No    Attends Banker Meetings: Never    Marital Status: Married  Catering manager Violence: Not At Risk (06/29/2023)   Humiliation, Afraid, Rape, and Kick questionnaire    Fear of Current or Ex-Partner: No    Emotionally Abused: No    Physically Abused: No    Sexually Abused: No    Outpatient Medications Prior to Visit  Medication Sig Dispense Refill   acetaminophen  (TYLENOL ) 325 MG tablet Take 2 tablets (650 mg total) by mouth every 6 (six) hours as needed. 36 tablet 0   CALCIUM  PO Take 1 tablet by mouth daily.     cholecalciferol (VITAMIN D3) 25 MCG (1000 UNIT) tablet Take 1 tablet (1,000 Units total) by mouth daily. 90 tablet 1   escitalopram  (LEXAPRO ) 10 MG tablet Take 1 tablet (10 mg total) by mouth daily. 90 tablet 3   fluticasone  (FLONASE ) 50 MCG/ACT nasal spray Place 2 sprays into both nostrils as needed for allergies or rhinitis. 9.9 mL 2   gabapentin  (NEURONTIN ) 300 MG capsule Take 1 capsule (300 mg total) by mouth daily as needed. 30 capsule 3   ibuprofen  (ADVIL ) 600 MG tablet Take 1 tablet (600 mg total) by mouth every 6 (six) hours as needed. Take medication with food to prevent upset stomach 30 tablet 1   iron  polysaccharides (NU-IRON ) 150 MG  capsule Take 1 capsule (150 mg total) by mouth daily. 30 capsule 3   lipase/protease/amylase (CREON ) 12000-38000 units CPEP capsule Take 12,000 Units by mouth 3 (three) times daily before meals.     losartan  (COZAAR ) 25 MG tablet Take 1 tablet (25 mg total) by mouth daily. 90 tablet 1   MAGNESIUM  PO Take 1 tablet by mouth daily.     Multiple Vitamin (MULTIVITAMIN WITH MINERALS) TABS tablet  Take 1 tablet by mouth daily.     norethindrone  (AYGESTIN ) 5 MG tablet Take by mouth daily.     Omega 3 1000 MG CAPS Take 1 capsule (1,000 mg total) by mouth 2 (two) times daily. 60 capsule 3   traZODone  (DESYREL ) 50 MG tablet Take 1 tablet (50 mg total) by mouth at bedtime. 30 tablet 2   No facility-administered medications prior to visit.    Allergies  Allergen Reactions   Penicillins Hives and Rash    Review of Systems  Constitutional:  Positive for chills. Negative for fever.  HENT:  Positive for congestion, postnasal drip and sinus pressure.   Eyes:  Negative for discharge and redness.  Respiratory:  Negative for cough and shortness of breath.   Gastrointestinal:  Positive for diarrhea and nausea.  Genitourinary:  Negative for dysuria and hematuria.  Musculoskeletal:  Negative for neck pain and neck stiffness.  Skin:  Negative for rash.  Neurological:  Positive for weakness. Negative for dizziness.  Psychiatric/Behavioral:  Negative for agitation and behavioral problems.        Objective:     Physical Exam Vitals reviewed.  Constitutional:      General: She is not in acute distress.    Appearance: She is not diaphoretic.  HENT:     Head: Normocephalic and atraumatic.     Nose: Mucosal edema and congestion present.     Mouth/Throat:     Mouth: Mucous membranes are dry.  Eyes:     General: No scleral icterus.    Extraocular Movements: Extraocular movements intact.  Cardiovascular:     Rate and Rhythm: Normal rate and regular rhythm.     Heart sounds: Normal heart sounds. No  murmur heard. Pulmonary:     Breath sounds: Normal breath sounds. No wheezing or rales.  Abdominal:     Palpations: Abdomen is soft.     Tenderness: There is no abdominal tenderness.  Musculoskeletal:     Cervical back: Neck supple. No tenderness.     Right lower leg: No edema.     Left lower leg: No edema.  Skin:    General: Skin is warm.     Findings: No rash.  Neurological:     General: No focal deficit present.     Mental Status: She is alert and oriented to person, place, and time.  Psychiatric:        Mood and Affect: Mood normal.        Behavior: Behavior normal.     BP 90/68   Pulse (!) 115   Ht 5\' 8"  (1.727 m)   Wt 99 lb 3.2 oz (45 kg)   SpO2 99%   BMI 15.08 kg/m  Wt Readings from Last 3 Encounters:  04/10/24 99 lb 3.2 oz (45 kg)  01/10/24 112 lb 1.9 oz (50.9 kg)  12/30/23 117 lb (53.1 kg)        Assessment & Plan:   Problem List Items Addressed This Visit       Cardiovascular and Mediastinum   Recurrent epistaxis   Has history of epistaxis in the past, currently has blood-tinged crusting nosebleeds with mucosal edema Referred to ENT specialist Advised to use Flonase  regularly      Relevant Orders   Ambulatory referral to ENT     Respiratory   Acute non-recurrent frontal sinusitis - Primary   Started empiric azithromycin  considering persistent symptoms despite symptomatic treatment Needs to use Flonase  regularly for nasal congestion/allergies Advised to use vaporizer/sinus inhaler  for nasal congestion      Relevant Medications   azithromycin  (ZITHROMAX ) 250 MG tablet     Digestive   Chronic diarrhea   History of chronic diarrhea, questionable diagnosis of chronic pancreatitis, was given Creon  once in the past Referred to GI for further evaluation Needs to maintain adequate hydration and eat at regular intervals      Relevant Orders   Ambulatory referral to Gastroenterology     Other   Nausea   Zofran  as needed for nausea Needs to  maintain adequate hydration      Relevant Medications   ondansetron  (ZOFRAN ) 4 MG tablet     Meds ordered this encounter  Medications   azithromycin  (ZITHROMAX ) 250 MG tablet    Sig: Take 2 tablets on day 1, then 1 tablet daily on days 2 through 5    Dispense:  6 tablet    Refill:  0   ondansetron  (ZOFRAN ) 4 MG tablet    Sig: Take 1 tablet (4 mg total) by mouth every 8 (eight) hours as needed for nausea or vomiting.    Dispense:  20 tablet    Refill:  1     Aairah Negrette Alyssa Backbone, MD

## 2024-04-10 NOTE — Telephone Encounter (Signed)
 Office visit or ED

## 2024-04-10 NOTE — Assessment & Plan Note (Signed)
 Started empiric azithromycin  considering persistent symptoms despite symptomatic treatment Needs to use Flonase  regularly for nasal congestion/allergies Advised to use vaporizer/sinus inhaler for nasal congestion

## 2024-04-11 ENCOUNTER — Ambulatory Visit: Payer: Self-pay | Admitting: Family Medicine

## 2024-04-21 NOTE — Progress Notes (Signed)
 Referring Provider: Meldon Sport, MD  Primary Care Physician:  Rosanna Comment, FNP Primary Gastroenterologist:  Dr. Riley Cheadle  Chief Complaint  Patient presents with   Abdominal Pain    Stomach cramps, feels like she is not having a complete bowel movement.     HPI:   Samantha Rosales is a 43 y.o. female presenting today at the request of Meldon Sport, MD for chronic diarrhea.    Today:  Reports acute onset diarrhea when she saw primary care on 5/8.  Thinks she had a stomach bug as her symptoms lasted couple days.  Prior to this, she had 1-2 normal bowel movements per day.  Since her diarrheal illness, she is not having some issues with incomplete bowel movements, but since Wednesday of last week, she has had generalized abdominal pain, cramping, bloating, abdominal distention, sensation of incomplete bowel movements.  She is having on average 3-4 small bowel movements per day that are loose.  She is also waking up in middle the night having bowel movements.  She is already had 4 bowel movements this morning.  Abdominal pain is constant, currently 7/10 in severity.   Had a small amount of toilet tissue hematochezia last week.  Reports had a couple episodes of tissue hematochezia in the past as well.  Due to sensation of incomplete bowel movements, she took magnesium  citrate on Thursday, Dulcolax on Friday, but continues to feel bad.   Prior Colonoscopy: None.  No Fhx of colon cancer, crohn's disease, or UC.   Vomited twice- Sunday and Monday. No hematemesis or coffee ground emesis. No nausea at this time.     Elevated LFTs:  History of LFT elevation felt to be related to chronic alcohol abuse +/- methotrexate previously. Serologic workup in 2019 with negative hep A, B, C labs with no immunity to hepatitis A or B.  ANA positive with homogenous pattern, 1:160 titer (felt to be secondary to underlying RA).  ASMA, AMA normal.  LKM1 ab normal.  Celiac screen negative.  IgG  within normal limits.  Ferritin elevated at 635.  Hemochromatosis DNA negative.    Most recent LFTs 12/30/2023 with AST 105, other LFTs within normal limits.  At that time, platelets were low at 96.  However, on 02/14/2024, platelets 211.  Last abdominal imaging with CT renal study 08/27/2019 showed marked hepatic steatosis.   ETOH:  2-3 seltzer beer daily.    Chronic anemia:  No iron  deficiency per prior lab evaluation.  Ferritin has been elevated. No B12 or folate deficiency.  Started taking iron  daily a couple months ago.   Rare ibuprofen .  Prior EGD:  None.   Past Medical History:  Diagnosis Date   Abnormal Pap smear    Alcohol use    Allergic rhinitis    BV (bacterial vaginosis)    Closed left ankle fracture    GERD (gastroesophageal reflux disease)    Hepatitis    LFT's >1000 (2019) due to methotrexate vs alcohol   High cholesterol    HSV-2 (herpes simplex virus 2) infection    Hypertension    Hypothyroidism 10/17/2019   Pancreatitis    12/19 (thought to be due to alcohol)   Rheumatoid arthritis (HCC)    Seizures (HCC)    dx as child with epilepsy but no seizures since 89 and on no meds   Vaginal Pap smear, abnormal     Past Surgical History:  Procedure Laterality Date   arm fracture  CERVICAL CONIZATION W/BX  12/20/2012   Procedure: CONIZATION CERVIX WITH BIOPSY;  Surgeon: Wendelyn Halter, MD;  Location: [facility];  Service: Gynecology;  Laterality: N/A;   FRACTURE SURGERY Left 2024   left arm   FRACTURE SURGERY Left 2022   left fracture   HOLMIUM LASER APPLICATION  12/20/2012   Procedure: HOLMIUM LASER APPLICATION;  Surgeon: Wendelyn Halter, MD;  Location: [facility];  Service: Gynecology;  Laterality: N/A;    Current Outpatient Medications  Medication Sig Dispense Refill   acetaminophen  (TYLENOL ) 325 MG tablet Take 2 tablets (650 mg total) by mouth every 6 (six) hours as needed. 36 tablet 0   CALCIUM  PO Take 1 tablet by mouth daily.     cholecalciferol (VITAMIN  D3) 25 MCG (1000 UNIT) tablet Take 1 tablet (1,000 Units total) by mouth daily. 90 tablet 1   escitalopram  (LEXAPRO ) 10 MG tablet Take 1 tablet (10 mg total) by mouth daily. 90 tablet 3   fluticasone  (FLONASE ) 50 MCG/ACT nasal spray Place 2 sprays into both nostrils as needed for allergies or rhinitis. 9.9 mL 2   gabapentin  (NEURONTIN ) 300 MG capsule Take 1 capsule (300 mg total) by mouth daily as needed. 30 capsule 3   ibuprofen  (ADVIL ) 600 MG tablet Take 1 tablet (600 mg total) by mouth every 6 (six) hours as needed. Take medication with food to prevent upset stomach 30 tablet 1   iron  polysaccharides (NU-IRON ) 150 MG capsule Take 1 capsule (150 mg total) by mouth daily. 30 capsule 3   losartan  (COZAAR ) 25 MG tablet Take 1 tablet (25 mg total) by mouth daily. 90 tablet 1   MAGNESIUM  PO Take 1 tablet by mouth daily.     Multiple Vitamin (MULTIVITAMIN WITH MINERALS) TABS tablet Take 1 tablet by mouth daily.     norethindrone  (AYGESTIN ) 5 MG tablet Take by mouth daily.     Omega 3 1000 MG CAPS Take 1 capsule (1,000 mg total) by mouth 2 (two) times daily. 60 capsule 3   ondansetron  (ZOFRAN ) 4 MG tablet Take 1 tablet (4 mg total) by mouth every 8 (eight) hours as needed for nausea or vomiting. 20 tablet 1   traZODone  (DESYREL ) 50 MG tablet Take 1 tablet (50 mg total) by mouth at bedtime. 30 tablet 2   No current facility-administered medications for this visit.    Allergies as of 04/23/2024 - Review Complete 04/23/2024  Allergen Reaction Noted   Penicillins Hives and Rash 12/13/2012    Family History  Problem Relation Age of Onset   Hypertension Mother    Hypertension Father    Pancreatic cancer Paternal Uncle    Colon cancer Neg Hx    Esophageal cancer Neg Hx    Inflammatory bowel disease Neg Hx    Liver disease Neg Hx    Rectal cancer Neg Hx    Stomach cancer Neg Hx     Social History   Socioeconomic History   Marital status: Married    Spouse name: Not on file   Number of  children: 2   Years of education: Not on file   Highest education level: Associate degree: occupational, Scientist, product/process development, or vocational program  Occupational History   Occupation: [employer]    Employer: [employer]   Tobacco Use   Smoking status: Every Day    Current packs/day: 0.50    Average packs/day: 0.5 packs/day for 15.0 years (7.5 ttl pk-yrs)    Types: Cigarettes   Smokeless tobacco: Never  Vaping  Use   Vaping status: Never Used  Substance and Sexual Activity   Alcohol use: Yes    Alcohol/week: 31.0 standard drinks of alcohol    Types: 3 Cans of beer, 28 Standard drinks or equivalent per week    Comment: 2-3 seltzer beer daily   Drug use: No   Sexual activity: Not Currently    Birth control/protection: Condom  Other Topics Concern   Not on file  Social History Narrative   Not on file   Social Drivers of Health   Financial Resource Strain: Medium Risk (01/09/2024)   Overall Financial Resource Strain (CARDIA)    Difficulty of Paying Living Expenses: Somewhat hard  Food Insecurity: No Food Insecurity (01/09/2024)   Hunger Vital Sign    Worried About Running Out of Food in the Last Year: Never true    Ran Out of Food in the Last Year: Never true  Transportation Needs: No Transportation Needs (01/09/2024)   PRAPARE - Administrator, Civil Service (Medical): No    Lack of Transportation (Non-Medical): No  Physical Activity: Inactive (01/09/2024)   Exercise Vital Sign    Days of Exercise per Week: 0 days    Minutes of Exercise per Session: 0 min  Stress: Stress Concern Present (01/09/2024)   [employer] of Occupational Health - Occupational Stress Questionnaire    Feeling of Stress : To some extent  Social Connections: Moderately Isolated (01/09/2024)   Social Connection and Isolation Panel [NHANES]    Frequency of Communication with Friends and Family: More than three times a week    Frequency of Social Gatherings with Friends and Family:  Twice a week    Attends Religious Services: Never    Database administrator or Organizations: No    Attends Banker Meetings: Never    Marital Status: Married  Catering manager Violence: Not At Risk (06/29/2023)   Humiliation, Afraid, Rape, and Kick questionnaire    Fear of Current or Ex-Partner: No    Emotionally Abused: No    Physically Abused: No    Sexually Abused: No    Review of Systems: Gen: Denies any fever, chills, cold or flu like symptoms.  CV: Denies chest pain, heart palpitations.  Resp: Denies shortness of breath, cough.  GI: See HPI GU : Denies urinary burning, urinary frequency, urinary hesitancy MS: Denies joint pain.  Derm: Denies rash. Psych: Denies depression, anxiety. Heme: See HPI  Physical Exam: BP 101/67 (BP Location: Right Arm, Patient Position: Sitting, Cuff Size: Normal)   Pulse 94   Temp 97.9 F (36.6 C) (Temporal)   Ht 5\' 8"  (1.727 m)   Wt 111 lb 9.6 oz (50.6 kg)   LMP 03/20/2024 (Approximate)   BMI 16.97 kg/m  General:   Alert and oriented. Pleasant and cooperative. Well-nourished and well-developed.  Head:  Normocephalic and atraumatic. Eyes:  Without icterus, sclera clear and conjunctiva pink.  Ears:  Normal auditory acuity. Lungs:  Clear to auscultation bilaterally. No wheezes, rales, or rhonchi. No distress.  Heart:  S1, S2 present without murmurs appreciated.  Abdomen:  +BS. Abdomen is full with moderate generalized TTP. No HSM noted. No guarding or rebound. No masses appreciated.  Rectal:  Deferred  Msk:  Symmetrical without gross deformities. Normal posture. Extremities:  Without edema. Neurologic:  Alert and  oriented x4;  grossly normal neurologically. Skin:  Intact without significant lesions or rashes. Psych:  Normal mood and affect.    Assessment:  43 year old female with history of  chronic alcohol use, elevated LFTs, HTN,, rheumatoid arthritis, seizures, anemia, presenting today for further evaluation of  abdominal pain and change in bowel habits.  Generalized abdominal pain/change in bowel habits/diarrhea: Etiology unclear. She had acute onset diarrhea around early May which seemed to improve, but has had loose stools since. Now with generalized abdominal pain and mild distension/bloating x 1 week and continues with frequent loose small volume stools including nocturnal stools. Abdominal pain and nocturnal stools concerning for infectious colitis. Could also have postinfectious IBS. She did recently have an episode of toilet tissue hematochezia, but reports this has occurred a couple times in the past as well, prior to autre illness. As she hasn't had any chronic issues with diarrhea, seems less consistent with IBD, but unable to rule this out. She will need a colonoscopy at some point, but recommend CT, labs, and stool testing first.   Rectal bleeding:  Single episode of toilet tissue hematochezia last week. Patient reports this has occurred a couple times in the past as well. Could be from benign anorectal such as hemorrhoids, but unable to rule out malignancy as she has never had a colonoscopy which will need to be arranged in the near future.  Elevated LFTs:  Chronic.  Prior extensive serologic workup in 2019 with positive ANA with homogenous pattern, 1:160 titer felt to be secondary to underlying RA.  Other labs including hepatitis A, B, C, ASMA, AMA, LKM1 ab, IgG, celiac screen without significant abnormality.  She has been found to have elevated ferritin, but hemochromatosis DNA was negative.  Previously felt elevated LFTs were secondary to chronic alcohol abuse +/- methotrexate.  Most recent labs 12/30/2023 with AST 105, other LFTs within normal limits.  Platelets were low at 96 in January, but in March, platelets 211.  She continues to drink beer daily which is likely the cause of AST elevation.  She has not had any abdominal imaging since 2020.  We are planning for CT due to abdominal pain which  will help reevaluate her liver for possible progression to cirrhosis.  Chronic anemia: May be secondary to anemia of chronic disease.  She has not been found to have B12, folate, or iron  deficiency on prior laboratory evaluation.  Her ferritin has been elevated.  Interestingly, she was started on oral iron  a couple months ago, but denies taking it in the past.  She has never had an EGD or colonoscopy.  No regular NSAID use. Reports intermittent toilet tissue hematochezia.  She will at least need a colonoscopy in future pending evaluation of abdominal pain as discussed.   Plan:  CBC, CMP, C. difficile GDH and toxin A/B, GI pathogen panel, Giardia, Cryptosporidium, CRP, fecal calprotectin. CT A/P with contrast STAT Encouraged complete alcohol cessation. Further recommendations to follow labs and imaging results.   Shana Daring, PA-C [facility] 04/23/2024

## 2024-04-23 ENCOUNTER — Encounter: Payer: Self-pay | Admitting: *Deleted

## 2024-04-23 ENCOUNTER — Telehealth: Payer: Self-pay | Admitting: *Deleted

## 2024-04-23 ENCOUNTER — Encounter: Payer: Self-pay | Admitting: Gastroenterology

## 2024-04-23 ENCOUNTER — Ambulatory Visit (HOSPITAL_COMMUNITY)

## 2024-04-23 ENCOUNTER — Ambulatory Visit (INDEPENDENT_AMBULATORY_CARE_PROVIDER_SITE_OTHER): Admitting: Gastroenterology

## 2024-04-23 VITALS — BP 101/67 | HR 94 | Temp 97.9°F | Ht 68.0 in | Wt 111.6 lb

## 2024-04-23 DIAGNOSIS — R1084 Generalized abdominal pain: Secondary | ICD-10-CM | POA: Diagnosis not present

## 2024-04-23 DIAGNOSIS — R140 Abdominal distension (gaseous): Secondary | ICD-10-CM

## 2024-04-23 DIAGNOSIS — D649 Anemia, unspecified: Secondary | ICD-10-CM | POA: Diagnosis not present

## 2024-04-23 DIAGNOSIS — R194 Change in bowel habit: Secondary | ICD-10-CM

## 2024-04-23 DIAGNOSIS — R197 Diarrhea, unspecified: Secondary | ICD-10-CM | POA: Diagnosis not present

## 2024-04-23 DIAGNOSIS — R7401 Elevation of levels of liver transaminase levels: Secondary | ICD-10-CM | POA: Diagnosis not present

## 2024-04-23 DIAGNOSIS — K625 Hemorrhage of anus and rectum: Secondary | ICD-10-CM

## 2024-04-23 NOTE — Telephone Encounter (Signed)
 [employer] PA: CPT Code 16109 Description: CT ABDOMEN & PELVIS W/ Case Number: [id number] Review Date: 04/23/2024 9:49:46 AM Expiration Date: N/A Status: This member's benefit plan did not require a prior authorization for this request.

## 2024-04-23 NOTE — Telephone Encounter (Signed)
 Called [employer]: No prior authorization per [facility]. Ref # [id number]

## 2024-04-23 NOTE — Patient Instructions (Signed)
 Please have labs and stool testing completed at [facility].   We will arrange for you to have a CT of your abdomen and pelvis.   Will call you with your results and further recommendations.  Due to elevated liver enzymes, I recommend you avoid all alcohol for now.   Shana Daring, PA-C [facility]

## 2024-04-24 ENCOUNTER — Ambulatory Visit (INDEPENDENT_AMBULATORY_CARE_PROVIDER_SITE_OTHER): Payer: Medicare Other | Admitting: Family Medicine

## 2024-04-24 ENCOUNTER — Encounter: Payer: Self-pay | Admitting: Family Medicine

## 2024-04-24 ENCOUNTER — Ambulatory Visit (HOSPITAL_COMMUNITY)
Admission: RE | Admit: 2024-04-24 | Discharge: 2024-04-24 | Disposition: A | Discharge status: Home / Self Care | Source: Ambulatory Visit | Attending: Gastroenterology | Admitting: Gastroenterology

## 2024-04-24 VITALS — BP 127/84 | HR 111 | Resp 100 | Ht 68.0 in | Wt 112.0 lb

## 2024-04-24 DIAGNOSIS — R140 Abdominal distension (gaseous): Secondary | ICD-10-CM | POA: Insufficient documentation

## 2024-04-24 DIAGNOSIS — R109 Unspecified abdominal pain: Secondary | ICD-10-CM | POA: Diagnosis not present

## 2024-04-24 DIAGNOSIS — I10 Essential (primary) hypertension: Secondary | ICD-10-CM

## 2024-04-24 DIAGNOSIS — K8689 Other specified diseases of pancreas: Secondary | ICD-10-CM | POA: Diagnosis not present

## 2024-04-24 DIAGNOSIS — R194 Change in bowel habit: Secondary | ICD-10-CM | POA: Diagnosis not present

## 2024-04-24 DIAGNOSIS — R197 Diarrhea, unspecified: Secondary | ICD-10-CM | POA: Diagnosis not present

## 2024-04-24 DIAGNOSIS — K861 Other chronic pancreatitis: Secondary | ICD-10-CM | POA: Diagnosis not present

## 2024-04-24 DIAGNOSIS — D509 Iron deficiency anemia, unspecified: Secondary | ICD-10-CM

## 2024-04-24 DIAGNOSIS — E559 Vitamin D deficiency, unspecified: Secondary | ICD-10-CM | POA: Diagnosis not present

## 2024-04-24 DIAGNOSIS — K8020 Calculus of gallbladder without cholecystitis without obstruction: Secondary | ICD-10-CM | POA: Diagnosis not present

## 2024-04-24 DIAGNOSIS — E038 Other specified hypothyroidism: Secondary | ICD-10-CM | POA: Diagnosis not present

## 2024-04-24 DIAGNOSIS — R1084 Generalized abdominal pain: Secondary | ICD-10-CM | POA: Diagnosis not present

## 2024-04-24 MED ORDER — SODIUM CHLORIDE (PF) 0.9 % IJ SOLN
INTRAMUSCULAR | Status: AC
Start: 2024-04-24 — End: ?
  Filled 2024-04-24: qty 50

## 2024-04-24 MED ORDER — IOHEXOL 300 MG/ML  SOLN
100.0000 mL | Freq: Once | INTRAMUSCULAR | Status: AC | PRN
  Administered 2024-04-24: 100 mL via INTRAVENOUS

## 2024-04-24 MED ORDER — IOHEXOL 9 MG/ML PO SOLN
1000.0000 mL | Freq: Once | ORAL | Status: AC
  Administered 2024-04-24: 1000 mL via ORAL

## 2024-04-24 MED ORDER — IOHEXOL 9 MG/ML PO SOLN
ORAL | Status: AC
  Filled 2024-04-24: qty 1000

## 2024-04-24 NOTE — Patient Instructions (Signed)

## 2024-04-24 NOTE — Progress Notes (Signed)
 Established Patient Office Visit   Subjective  Patient ID: Samantha Rosales, female    DOB: [birth year]  Age: 43 y.o. MRN: [id number]  Chief Complaint  Patient presents with   Hypokalemia    She  has a past medical history of Abnormal Pap smear, Alcohol use, Allergic rhinitis, BV (bacterial vaginosis), Closed left ankle fracture, GERD (gastroesophageal reflux disease), Hepatitis, High cholesterol, HSV-2 (herpes simplex virus 2) infection, Hypertension, Hypothyroidism (10/17/2019), Pancreatitis, Rheumatoid arthritis (HCC), Seizures (HCC), and Vaginal Pap smear, abnormal.  HPI Patient presents to the clinic for hypertension. For the details of today's visit, please refer to assessment and plan.   Review of Systems  Constitutional:  Negative for chills and fever.  Eyes:  Negative for blurred vision.  Respiratory:  Negative for shortness of breath.   Cardiovascular:  Negative for chest pain.  Neurological:  Negative for dizziness and headaches.      Objective:     BP 127/84   Pulse (!) 111   Resp (!) 100   Ht 5\' 8"  (1.727 m)   Wt 112 lb (50.8 kg)   LMP 03/20/2024 (Approximate)   BMI 17.03 kg/m  BP Readings from Last 3 Encounters:  04/24/24 127/84  04/23/24 101/67  04/10/24 90/68      Physical Exam Vitals reviewed.  Constitutional:      General: She is not in acute distress.    Appearance: Normal appearance. She is not ill-appearing, toxic-appearing or diaphoretic.  HENT:     Head: Normocephalic.  Eyes:     General:        Right eye: No discharge.        Left eye: No discharge.     Conjunctiva/sclera: Conjunctivae normal.  Cardiovascular:     Rate and Rhythm: Normal rate.     Pulses: Normal pulses.     Heart sounds: Normal heart sounds.  Pulmonary:     Effort: Pulmonary effort is normal. No respiratory distress.     Breath sounds: Normal breath sounds.  Skin:    General: Skin is warm and dry.     Capillary Refill: Capillary refill takes less than 2 seconds.   Neurological:     Mental Status: She is alert.  Psychiatric:        Mood and Affect: Mood normal.        Behavior: Behavior normal.      No results found for any visits on 04/24/24.  The ASCVD Risk score (Arnett DK, et al., 2019) failed to calculate for the following reasons:   The valid total cholesterol range is 130 to 320 mg/dL    Assessment & Plan:  Primary hypertension Assessment & Plan: Vitals:   04/24/24 0844  BP: 127/84   Continue Losartan  25 mg once daily. Labs ordered. Discussed with  patient to monitor their blood pressure regularly and maintain a heart-healthy diet rich in fruits, vegetables, whole grains, and low-fat dairy, while reducing sodium intake to less than 2,300 mg per day. Regular physical activity, such as 30 minutes of moderate exercise most days of the week, will help lower blood pressure and improve overall cardiovascular health. Avoiding smoking, limiting alcohol consumption, and managing stress. Take  prescribed medication, & take it as directed and avoid skipping doses. Seek emergency care if your blood pressure is (over 180/100) or you experience chest pain, shortness of breath, or sudden vision changes.Patient verbalizes understanding regarding plan of care and all questions answered.   Orders: -     BMP8+eGFR -  Lipid panel  TSH (thyroid -stimulating hormone deficiency) -     TSH + free T4  Vitamin D  deficiency -     VITAMIN D  25 Hydroxy (Vit-D Deficiency, Fractures)  Iron  deficiency anemia, unspecified iron  deficiency anemia type -     Iron , TIBC and Ferritin Panel    Return in about 6 months (around 10/25/2024), or if symptoms worsen or fail to improve, for chronic follow-up.   Avelino Lek Amber Bail, FNP

## 2024-04-24 NOTE — Assessment & Plan Note (Signed)
 Vitals:   04/24/24 0844  BP: 127/84   Continue Losartan  25 mg once daily. Labs ordered. Discussed with  patient to monitor their blood pressure regularly and maintain a heart-healthy diet rich in fruits, vegetables, whole grains, and low-fat dairy, while reducing sodium intake to less than 2,300 mg per day. Regular physical activity, such as 30 minutes of moderate exercise most days of the week, will help lower blood pressure and improve overall cardiovascular health. Avoiding smoking, limiting alcohol consumption, and managing stress. Take  prescribed medication, & take it as directed and avoid skipping doses. Seek emergency care if your blood pressure is (over 180/100) or you experience chest pain, shortness of breath, or sudden vision changes.Patient verbalizes understanding regarding plan of care and all questions answered.

## 2024-04-25 ENCOUNTER — Other Ambulatory Visit: Payer: Self-pay

## 2024-04-25 ENCOUNTER — Other Ambulatory Visit: Payer: Self-pay | Admitting: Family Medicine

## 2024-04-25 ENCOUNTER — Encounter (HOSPITAL_COMMUNITY): Payer: Self-pay | Admitting: Emergency Medicine

## 2024-04-25 ENCOUNTER — Ambulatory Visit: Payer: Self-pay | Admitting: Family Medicine

## 2024-04-25 ENCOUNTER — Telehealth: Payer: Self-pay | Admitting: Internal Medicine

## 2024-04-25 ENCOUNTER — Emergency Department (HOSPITAL_COMMUNITY)
Admission: EM | Admit: 2024-04-25 | Discharge: 2024-04-25 | Disposition: A | Discharge status: Home / Self Care | Attending: Student | Admitting: Student

## 2024-04-25 ENCOUNTER — Ambulatory Visit: Payer: Self-pay | Admitting: Gastroenterology

## 2024-04-25 DIAGNOSIS — D649 Anemia, unspecified: Secondary | ICD-10-CM | POA: Insufficient documentation

## 2024-04-25 DIAGNOSIS — R799 Abnormal finding of blood chemistry, unspecified: Secondary | ICD-10-CM | POA: Diagnosis present

## 2024-04-25 DIAGNOSIS — F17210 Nicotine dependence, cigarettes, uncomplicated: Secondary | ICD-10-CM | POA: Diagnosis not present

## 2024-04-25 DIAGNOSIS — E039 Hypothyroidism, unspecified: Secondary | ICD-10-CM | POA: Insufficient documentation

## 2024-04-25 DIAGNOSIS — E876 Hypokalemia: Secondary | ICD-10-CM | POA: Diagnosis not present

## 2024-04-25 DIAGNOSIS — Z79899 Other long term (current) drug therapy: Secondary | ICD-10-CM | POA: Diagnosis not present

## 2024-04-25 DIAGNOSIS — I10 Essential (primary) hypertension: Secondary | ICD-10-CM | POA: Insufficient documentation

## 2024-04-25 LAB — COMPREHENSIVE METABOLIC PANEL WITH GFR
ALT: 20 U/L (ref 0–44)
ALT: 18 U/L (ref 0–44)
AST: 64 U/L — ABNORMAL HIGH (ref 15–41)
AST: 49 U/L — ABNORMAL HIGH (ref 15–41)
Albumin: 2.6 g/dL — ABNORMAL LOW (ref 3.5–5.0)
Albumin: 2.7 g/dL — ABNORMAL LOW (ref 3.5–5.0)
Alkaline Phosphatase: 173 U/L — ABNORMAL HIGH (ref 38–126)
Alkaline Phosphatase: 146 U/L — ABNORMAL HIGH (ref 38–126)
Anion gap: 9 (ref 5–15)
Anion gap: 6 (ref 5–15)
BUN: <5 mg/dL — ABNORMAL LOW (ref 6–20)
BUN: <5 mg/dL — ABNORMAL LOW (ref 6–20)
CO2: 21 mmol/L — ABNORMAL LOW (ref 22–32)
CO2: 22 mmol/L (ref 22–32)
Calcium: 6.4 mg/dL — CL (ref 8.9–10.3)
Calcium: 6.7 mg/dL — ABNORMAL LOW (ref 8.9–10.3)
Chloride: 104 mmol/L (ref 98–111)
Chloride: 110 mmol/L (ref 98–111)
Creatinine, Ser: 0.58 mg/dL (ref 0.44–1.00)
Creatinine, Ser: 0.44 mg/dL (ref 0.44–1.00)
GFR, Estimated: >60 mL/min (ref >60)
GFR, Estimated: >60 mL/min (ref >60)
Glucose, Bld: 79 mg/dL (ref 70–99)
Glucose, Bld: 79 mg/dL (ref 70–99)
Potassium: 2.7 mmol/L — CL (ref 3.5–5.1)
Potassium: 2.8 mmol/L — ABNORMAL LOW (ref 3.5–5.1)
Sodium: 134 mmol/L — ABNORMAL LOW (ref 135–145)
Sodium: 138 mmol/L (ref 135–145)
Total Bilirubin: 0.9 mg/dL (ref 0.0–1.2)
Total Bilirubin: 1.1 mg/dL (ref 0.0–1.2)
Total Protein: 6.1 g/dL — ABNORMAL LOW (ref 6.5–8.1)
Total Protein: 5.7 g/dL — ABNORMAL LOW (ref 6.5–8.1)

## 2024-04-25 LAB — LIPID PANEL
Chol/HDL Ratio: 2.7 ratio (ref 0.0–4.4)
Cholesterol, Total: 140 mg/dL (ref 100–199)
HDL: 52 mg/dL (ref >39)
LDL Chol Calc (NIH): 70 mg/dL (ref 0–99)
Triglycerides: 99 mg/dL (ref 0–149)
VLDL Cholesterol Cal: 18 mg/dL (ref 5–40)

## 2024-04-25 LAB — IRON,TIBC AND FERRITIN PANEL
Ferritin: 437 ng/mL — ABNORMAL HIGH (ref 15–150)
Iron Saturation: 20 % (ref 15–55)
Iron: 37 ug/dL (ref 27–159)
Total Iron Binding Capacity: 187 ug/dL — ABNORMAL LOW (ref 250–450)
UIBC: 150 ug/dL (ref 131–425)

## 2024-04-25 LAB — CBC WITH DIFFERENTIAL/PLATELET
Abs Immature Granulocytes: 0.08 10*3/uL — ABNORMAL HIGH (ref 0.00–0.07)
Basophils Absolute: 0 10*3/uL (ref 0.0–0.1)
Basophils Relative: 1 %
Eosinophils Absolute: 0.1 10*3/uL (ref 0.0–0.5)
Eosinophils Relative: 1 %
HCT: 20.4 % — ABNORMAL LOW (ref 36.0–46.0)
Hemoglobin: 6.8 g/dL — CL (ref 12.0–15.0)
Immature Granulocytes: 1 %
Lymphocytes Relative: 28 %
Lymphs Abs: 1.8 10*3/uL (ref 0.7–4.0)
MCH: 33.7 pg (ref 26.0–34.0)
MCHC: 33.3 g/dL (ref 30.0–36.0)
MCV: 101 fL — ABNORMAL HIGH (ref 80.0–100.0)
Monocytes Absolute: 0.3 10*3/uL (ref 0.1–1.0)
Monocytes Relative: 5 %
Neutro Abs: 4.3 10*3/uL (ref 1.7–7.7)
Neutrophils Relative %: 64 %
Platelets: 136 10*3/uL — ABNORMAL LOW (ref 150–400)
RBC: 2.02 MIL/uL — ABNORMAL LOW (ref 3.87–5.11)
RDW: 14.9 % (ref 11.5–15.5)
Smear Review: NORMAL
WBC: 6.6 10*3/uL (ref 4.0–10.5)
nRBC: 0.3 % — ABNORMAL HIGH (ref 0.0–0.2)

## 2024-04-25 LAB — TSH+FREE T4
Free T4: 1.12 ng/dL (ref 0.82–1.77)
TSH: 1.68 u[IU]/mL (ref 0.450–4.500)

## 2024-04-25 LAB — POC OCCULT BLOOD, ED: Fecal Occult Bld: POSITIVE — AB

## 2024-04-25 LAB — BMP8+EGFR
BUN/Creatinine Ratio: 6 — ABNORMAL LOW (ref 9–23)
BUN: 3 mg/dL — ABNORMAL LOW (ref 6–24)
CO2: 21 mmol/L (ref 20–29)
Calcium: 6.7 mg/dL — CL (ref 8.7–10.2)
Chloride: 102 mmol/L (ref 96–106)
Creatinine, Ser: 0.52 mg/dL — ABNORMAL LOW (ref 0.57–1.00)
Glucose: 97 mg/dL (ref 70–99)
Potassium: 2.8 mmol/L — ABNORMAL LOW (ref 3.5–5.2)
Sodium: 139 mmol/L (ref 134–144)
eGFR: 118 mL/min/{1.73_m2} (ref >59)

## 2024-04-25 LAB — PREPARE RBC (CROSSMATCH)

## 2024-04-25 LAB — FOLATE: Folate: 14.5 ng/mL (ref >5.9)

## 2024-04-25 LAB — VITAMIN B12: Vitamin B-12: 549 pg/mL (ref 180–914)

## 2024-04-25 LAB — ABO/RH: ABO/RH(D): A POS

## 2024-04-25 LAB — VITAMIN D 25 HYDROXY (VIT D DEFICIENCY, FRACTURES): Vit D, 25-Hydroxy: 10.4 ng/mL — ABNORMAL LOW (ref 30.0–100.0)

## 2024-04-25 MED ORDER — CALCIUM GLUCONATE-NACL 1-0.675 GM/50ML-% IV SOLN
1.0000 g | Freq: Once | INTRAVENOUS | Status: AC
  Administered 2024-04-25: 1000 mg via INTRAVENOUS
  Filled 2024-04-25: qty 50

## 2024-04-25 MED ORDER — POTASSIUM CHLORIDE CRYS ER 10 MEQ PO TBCR: 10.0000 meq | EXTENDED_RELEASE_TABLET | Freq: Every day | ORAL | 1 refills | Status: DC

## 2024-04-25 MED ORDER — MAGNESIUM OXIDE -MG SUPPLEMENT 400 (240 MG) MG PO TABS
800.0000 mg | ORAL_TABLET | Freq: Once | ORAL | Status: AC
  Administered 2024-04-25: 800 mg via ORAL
  Filled 2024-04-25: qty 2

## 2024-04-25 MED ORDER — POTASSIUM CHLORIDE CRYS ER 20 MEQ PO TBCR
40.0000 meq | EXTENDED_RELEASE_TABLET | Freq: Once | ORAL | Status: AC
  Administered 2024-04-25: 40 meq via ORAL
  Filled 2024-04-25: qty 2

## 2024-04-25 MED ORDER — SODIUM CHLORIDE 0.9% IV SOLUTION: Freq: Once | INTRAVENOUS | Status: DC

## 2024-04-25 MED ORDER — OYSTER SHELL CALCIUM/D3 500-5 MG-MCG PO TABS: 1.0000 | ORAL_TABLET | Freq: Every day | ORAL | 0 refills | Status: AC

## 2024-04-25 MED ORDER — POTASSIUM CHLORIDE 10 MEQ/100ML IV SOLN
10.0000 meq | Freq: Once | INTRAVENOUS | Status: AC
  Administered 2024-04-25: 10 meq via INTRAVENOUS
  Filled 2024-04-25: qty 100

## 2024-04-25 MED ORDER — ALBUMIN HUMAN 25 % IV SOLN
25.0000 g | Freq: Once | INTRAVENOUS | Status: AC
  Administered 2024-04-25: 25 g via INTRAVENOUS
  Filled 2024-04-25: qty 100

## 2024-04-25 MED ORDER — POTASSIUM CHLORIDE CRYS ER 20 MEQ PO TBCR: 20.0000 meq | EXTENDED_RELEASE_TABLET | Freq: Two times a day (BID) | ORAL | 0 refills | Status: DC

## 2024-04-25 NOTE — ED Notes (Signed)
Patient verbalizes understanding of discharge instructions. Opportunity for questioning and answers were provided. Armband removed by staff, pt discharged from ED. Ambulated out to lobby  

## 2024-04-25 NOTE — Telephone Encounter (Signed)
 Call received this morning the critical lab result. Ca2+ 6.7. Additional labs are not yet available for review. It appears that she has a longstanding history of hypocalcemia and electrolyte abnormalities. I called Samantha Rosales to review this result and recommended ER presentation for confirmation of critical hypocalcemia and appropriate treatment. She confirmed understanding of the result and my recommendations.

## 2024-04-25 NOTE — ED Provider Notes (Signed)
 [place] EMERGENCY DEPARTMENT AT [facility] Provider Note  CSN: [id number] Arrival date & time: 04/25/24 1237  Chief Complaint(s) Abnormal Lab  HPI Samantha Rosales is a 43 y.o. female with PMH alcohol use, rheumatoid arthritis, GERD who presents emergency room for evaluation of abnormal labs.  Patient had a routine physical yesterday where she had routine labs done that showed a critical hypocalcemia of 6.7 and some mild hypokalemia and was instructed to come the emergency department for lab recheck.  She states she has been mostly asymptomatic and had a diarrheal illness approximately 2 weeks ago but has been able to tolerate p.o. without difficulty over the last 2 weeks.  Is denying any paresthesias, confusion, hallucinations, cramping or other symptoms associate with hypocalcemia.  Currently denies chest pain, shortness of breath, headache, fever or other systemic symptoms.   Past Medical History Past Medical History:  Diagnosis Date   Abnormal Pap smear    Alcohol use    Allergic rhinitis    BV (bacterial vaginosis)    Closed left ankle fracture    GERD (gastroesophageal reflux disease)    Hepatitis    LFT's >1000 (2019) due to methotrexate vs alcohol   High cholesterol    HSV-2 (herpes simplex virus 2) infection    Hypertension    Hypothyroidism 10/17/2019   Pancreatitis    12/19 (thought to be due to alcohol)   Rheumatoid arthritis (HCC)    Seizures (HCC)    dx as child with epilepsy but no seizures since 89 and on no meds   Vaginal Pap smear, abnormal    Patient Active Problem List   Diagnosis Date Noted   Acute non-recurrent frontal sinusitis 04/10/2024   Chronic diarrhea 04/10/2024   Iron  deficiency anemia 01/10/2024   Amenorrhea 09/03/2023   Negative pregnancy test 09/03/2023   Screening mammogram for breast cancer 09/03/2023   LGSIL on Pap smear of cervix 06/15/2023   Encounter for screening fecal occult blood testing 06/12/2023   Amenorrhea,  secondary 06/12/2023   Recurrent epistaxis 06/04/2023   Hot flashes 04/12/2023   Insomnia 03/06/2023   Alcohol use disorder, severe, dependence (HCC) 09/20/2022   Hyperphosphatemia 08/26/2022   Thrombocytopenia (HCC) 08/26/2022   AKI (acute kidney injury) (HCC) 08/26/2022   Abdominal pain 08/26/2022   Nausea 08/26/2022   Protein-calorie malnutrition, severe 06/21/2022   Metabolic acidosis 06/20/2022   Hypocalcemia 02/11/2022   MDD (major depressive disorder), recurrent episode, mild (HCC) 02/11/2022   Generalized weakness 02/10/2022   Anemia, chronic disease 02/10/2022   Hypophosphatemia 02/10/2022   Dysphagia 02/10/2022   Chronic fatigue syndrome 09/21/2021   Polyarthralgia 09/21/2021   Closed displaced spiral fracture of shaft of left humerus 09/14/2021   Hepatic steatosis 08/19/2021   Bilateral optic neuropathy 03/22/2021   Retinal edema 03/22/2021   Abnormal auditory perception of both ears 03/21/2021   Pancreatitis    Hypertensive urgency 10/17/2019   Hypokalemia 10/17/2019   Hypomagnesemia 10/17/2019   Tobacco abuse 10/17/2019   Lactic acidosis 10/17/2019   Confusion    Abnormal LFTs 09/05/2018   Elevated lipase 09/05/2018   History of methotrexate therapy 09/05/2018   High triglycerides 09/05/2018   Elevated liver enzymes 07/01/2018   Primary hypertension 07/01/2018   Encounter for well woman exam with routine gynecological exam 07/01/2018   History of abnormal cervical Pap smear 06/26/2017   Encounter for surveillance of injectable contraceptive 06/26/2017   Encounter for gynecological examination with Papanicolaou smear of cervix 06/26/2017   High risk medication use 06/14/2015  Rheumatoid arthritis involving multiple sites with positive rheumatoid factor (HCC) 02/24/2015   Arthritis, rheumatic, acute or subacute 11/18/2013   Urticaria, idiopathic 08/24/2013   HSV-2 (herpes simplex virus 2) infection 05/29/2013   HPV (human papilloma virus) infection  05/29/2013   Home Medication(s) Prior to Admission medications   Medication Sig Start Date End Date Taking? Authorizing Provider  calcium -vitamin D  (OSCAL WITH D) 500-5 MG-MCG tablet Take 1 tablet by mouth daily with breakfast. 04/25/24 05/25/24 Yes Anaia Frith, MD  potassium chloride  SA (KLOR-CON  M) 20 MEQ tablet Take 1 tablet (20 mEq total) by mouth 2 (two) times daily for 7 days. 04/25/24 05/02/24 Yes Jahree Dermody, MD  triamcinolone (KENALOG) 0.1 % paste Use as directed 1 Application in the mouth or throat 3 (three) times daily. 12/31/23  Yes [provider]  acetaminophen  (TYLENOL ) 325 MG tablet Take 2 tablets (650 mg total) by mouth every 6 (six) hours as needed. 02/29/24   Del Abron Abt, FNP  CALCIUM  PO Take 1 tablet by mouth daily.    [provider]  cholecalciferol (VITAMIN D3) 25 MCG (1000 UNIT) tablet Take 1 tablet (1,000 Units total) by mouth daily. 02/29/24   Del Abron Abt, FNP  escitalopram  (LEXAPRO ) 10 MG tablet Take 1 tablet (10 mg total) by mouth daily. 02/29/24   Del Abron Abt, FNP  fluticasone  (FLONASE ) 50 MCG/ACT nasal spray Place 2 sprays into both nostrils as needed for allergies or rhinitis. 02/29/24   Del Abron Abt, FNP  gabapentin  (NEURONTIN ) 300 MG capsule Take 1 capsule (300 mg total) by mouth daily as needed. 02/29/24   Del Abron Abt, FNP  ibuprofen  (ADVIL ) 600 MG tablet Take 1 tablet (600 mg total) by mouth every 6 (six) hours as needed. Take medication with food to prevent upset stomach 02/29/24   Del Amber Bail, Rogerio Clay, FNP  iron  polysaccharides (NU-IRON ) 150 MG capsule Take 1 capsule (150 mg total) by mouth daily. 02/29/24   Del Abron Abt, FNP  losartan  (COZAAR ) 25 MG tablet Take 1 tablet (25 mg total) by mouth daily. 02/29/24   Del Abron Abt, FNP  MAGNESIUM  PO Take 1 tablet by mouth daily.    [provider]  Multiple Vitamin (MULTIVITAMIN WITH MINERALS) TABS tablet  Take 1 tablet by mouth daily.    [provider]  norethindrone  (AYGESTIN ) 5 MG tablet Take by mouth daily.    [provider]  Omega 3 1000 MG CAPS Take 1 capsule (1,000 mg total) by mouth 2 (two) times daily. 02/29/24   Del Abron Abt, FNP  ondansetron  (ZOFRAN ) 4 MG tablet Take 1 tablet (4 mg total) by mouth every 8 (eight) hours as needed for nausea or vomiting. 04/10/24   Meldon Sport, MD  potassium chloride  (KLOR-CON  M) 10 MEQ tablet Take 1 tablet (10 mEq total) by mouth daily. 04/25/24   Del Orbe Polanco, Iliana, FNP  traZODone  (DESYREL ) 50 MG tablet Take 1 tablet (50 mg total) by mouth at bedtime. 02/29/24   Del Abron Abt, FNP  Past Surgical History Past Surgical History:  Procedure Laterality Date   arm fracture     CERVICAL CONIZATION W/BX  12/20/2012   Procedure: CONIZATION CERVIX WITH BIOPSY;  Surgeon: Wendelyn Halter, MD;  Location: [facility];  Service: Gynecology;  Laterality: N/A;   FRACTURE SURGERY Left 2024   left arm   FRACTURE SURGERY Left 2022   left fracture   HOLMIUM LASER APPLICATION  12/20/2012   Procedure: HOLMIUM LASER APPLICATION;  Surgeon: Wendelyn Halter, MD;  Location: [facility];  Service: Gynecology;  Laterality: N/A;   Family History Family History  Problem Relation Age of Onset   Hypertension Mother    Hypertension Father    Pancreatic cancer Paternal Uncle    Colon cancer Neg Hx    Esophageal cancer Neg Hx    Inflammatory bowel disease Neg Hx    Liver disease Neg Hx    Rectal cancer Neg Hx    Stomach cancer Neg Hx     Social History Social History   Tobacco Use   Smoking status: Every Day    Current packs/day: 0.50    Average packs/day: 0.5 packs/day for 15.0 years (7.5 ttl pk-yrs)    Types: Cigarettes   Smokeless tobacco: Never  Vaping Use   Vaping status: Never Used  Substance Use  Topics   Alcohol use: Yes    Alcohol/week: 31.0 standard drinks of alcohol    Types: 3 Cans of beer, 28 Standard drinks or equivalent per week    Comment: 2-3 seltzer beer daily   Drug use: No   Allergies Penicillins  Review of Systems Review of Systems  All other systems reviewed and are negative.   Physical Exam Vital Signs  I have reviewed the triage vital signs BP 139/89   Pulse 92   Temp 98.6 F (37 C) (Oral)   Resp 20   Ht 5\' 8"  (1.727 m)   Wt 50.8 kg   LMP 03/20/2024 (Approximate)   SpO2 97%   BMI 17.03 kg/m   Physical Exam Vitals and nursing note reviewed.  Constitutional:      General: She is not in acute distress.    Appearance: She is well-developed.  HENT:     Head: Normocephalic and atraumatic.  Eyes:     Conjunctiva/sclera: Conjunctivae normal.  Cardiovascular:     Rate and Rhythm: Normal rate and regular rhythm.     Heart sounds: No murmur heard. Pulmonary:     Effort: Pulmonary effort is normal. No respiratory distress.     Breath sounds: Normal breath sounds.  Abdominal:     Palpations: Abdomen is soft.     Tenderness: There is no abdominal tenderness.  Musculoskeletal:        General: No swelling.     Cervical back: Neck supple.  Skin:    General: Skin is warm and dry.     Capillary Refill: Capillary refill takes less than 2 seconds.  Neurological:     Mental Status: She is alert.  Psychiatric:        Mood and Affect: Mood normal.    ED Results and Treatments Labs (all labs ordered are listed, but only abnormal results are displayed) Labs Reviewed  COMPREHENSIVE METABOLIC PANEL WITH GFR - Abnormal; Notable for the following components:      Result Value   Sodium 134 (*)    Potassium 2.7 (*)    CO2 21 (*)    BUN <5 (*)    Calcium  6.4 (*)  Total Protein 6.1 (*)    Albumin 2.6 (*)    AST 64 (*)    Alkaline Phosphatase 173 (*)    All other components within normal limits  CBC WITH DIFFERENTIAL/PLATELET - Abnormal; Notable for  the following components:   RBC 2.02 (*)    Hemoglobin 6.8 (*)    HCT 20.4 (*)    MCV 101.0 (*)    Platelets 136 (*)    nRBC 0.3 (*)    Abs Immature Granulocytes 0.08 (*)    All other components within normal limits  COMPREHENSIVE METABOLIC PANEL WITH GFR - Abnormal; Notable for the following components:   Potassium 2.8 (*)    BUN <5 (*)    Calcium  6.7 (*)    Total Protein 5.7 (*)    Albumin 2.7 (*)    AST 49 (*)    Alkaline Phosphatase 146 (*)    All other components within normal limits  POC OCCULT BLOOD, ED - Abnormal; Notable for the following components:   Fecal Occult Bld POSITIVE (*)    All other components within normal limits  VITAMIN B12  FOLATE  PARATHYROID HORMONE, INTACT (NO CA)  I-STAT CHEM 8, ED  TYPE AND SCREEN  PREPARE RBC (CROSSMATCH)  ABO/RH                                                                                                                          Radiology No results found.  Pertinent labs & imaging results that were available during my care of the patient were reviewed by me and considered in my medical decision making (see MDM for details).  Medications Ordered in ED Medications  0.9 %  sodium chloride  infusion (Manually program via Guardrails IV Fluids) (has no administration in time range)  potassium chloride  SA (KLOR-CON  M) CR tablet 40 mEq (40 mEq Oral Given 04/25/24 1524)  magnesium  oxide (MAG-OX) tablet 800 mg (800 mg Oral Given 04/25/24 1524)  potassium chloride  10 mEq in 100 mL IVPB (0 mEq Intravenous Stopped 04/25/24 1725)  calcium  gluconate 1 g/ 50 mL sodium chloride  IVPB (0 mg Intravenous Stopped 04/25/24 1725)  albumin human 25 % solution 25 g (0 g Intravenous Stopped 04/25/24 1725)  potassium chloride  SA (KLOR-CON  M) CR tablet 40 mEq (40 mEq Oral Given 04/25/24 1911)  Procedures .Critical  Care  Performed by: Karlyn Overman, MD Authorized by: Karlyn Overman, MD   Critical care provider statement:    Critical care time (minutes):  30   Critical care was necessary to treat or prevent imminent or life-threatening deterioration of the following conditions:  Metabolic crisis (Anemia requiring blood transfusion)   Critical care was time spent personally by me on the following activities:  Development of treatment plan with patient or surrogate, discussions with consultants, evaluation of patient's response to treatment, examination of patient, ordering and review of laboratory studies, ordering and review of radiographic studies, ordering and performing treatments and interventions, pulse oximetry, re-evaluation of patient's condition and review of old charts   (including critical care time)  Medical Decision Making / ED Course   This patient presents to the ED for concern of abnormal labs, this involves an extensive number of treatment options, and is a complaint that carries with it a high risk of complications and morbidity.  The differential diagnosis includes electrolyte abnormality, hypoparathyroidism, decreased p.o. take in the setting of alcohol use, anemia  MDM: Patient seen in the emergency room for evaluation of abnormal labs.  Physical exam is largely unremarkable.  Laboratory evaluation is concerning with a potassium of 2.7, calcium  6.4, albumin 2.6, AST 64, alk phos 173, new significant anemia with hemoglobin 6.8 with an MCV of 101.0.  Patient's Hemoccult is very minimally positive.  PTH sent and is pending.  Vitamin B12 and folate sent which are reassuringly normal.  I initially did asked the patient to stay in the hospital given new significant hemoglobin drop, Hemoccult positive with multiple electrolyte abnormalities.  However, she is requesting to avoid hospitalization if possible as she already has an outpatient gastroenterologist that she follows closely with and a  PCP who is following her blood work.  Thus, patients electrolyte abnormalities were addressed with both IV and oral potassium, IV calcium  gluconate, albumin repletion, oral magnesium  and on repeat BMP, potassium has minimally improved to 8, albumin minimally improved to 2.7 and calcium  is 6.7.  Given patient's hypoalbuminemia this calcium  corrects to 7.7.  Patient was transfused 1 unit packed red blood cells for symptomatic anemia.  Using shared decision making, patient states that she will contact her primary care physician for laboratory with, contact her gastroenterologist for outpatient evaluation for possible colonoscopy and it we will supplement with oral potassium, oral vitamin D /calcium  and patient will return to the emergency department if she has any progressive worsening of symptoms.  She does not have any true symptomatic hypocalcemia here in the ER and she Ch[place] sign is negative.  Thus, your decision-making, she was discharged with outpatient follow-up and very strict return precautions.    Additional history obtained: -Additional history obtained from daughter -External records from outside source obtained and reviewed including: Chart review including previous notes, labs, imaging, consultation notes   Lab Tests: -I ordered, reviewed, and interpreted labs.   The pertinent results include:   Labs Reviewed  COMPREHENSIVE METABOLIC PANEL WITH GFR - Abnormal; Notable for the following components:      Result Value   Sodium 134 (*)    Potassium 2.7 (*)    CO2 21 (*)    BUN <5 (*)    Calcium  6.4 (*)    Total Protein 6.1 (*)    Albumin 2.6 (*)    AST 64 (*)    Alkaline Phosphatase 173 (*)    All other components within normal limits  CBC WITH DIFFERENTIAL/PLATELET -  Abnormal; Notable for the following components:   RBC 2.02 (*)    Hemoglobin 6.8 (*)    HCT 20.4 (*)    MCV 101.0 (*)    Platelets 136 (*)    nRBC 0.3 (*)    Abs Immature Granulocytes 0.08 (*)    All other  components within normal limits  COMPREHENSIVE METABOLIC PANEL WITH GFR - Abnormal; Notable for the following components:   Potassium 2.8 (*)    BUN <5 (*)    Calcium  6.7 (*)    Total Protein 5.7 (*)    Albumin 2.7 (*)    AST 49 (*)    Alkaline Phosphatase 146 (*)    All other components within normal limits  POC OCCULT BLOOD, ED - Abnormal; Notable for the following components:   Fecal Occult Bld POSITIVE (*)    All other components within normal limits  VITAMIN B12  FOLATE  PARATHYROID HORMONE, INTACT (NO CA)  I-STAT CHEM 8, ED  TYPE AND SCREEN  PREPARE RBC (CROSSMATCH)  ABO/RH      EKG   EKG Interpretation Date/Time:  Friday Apr 25 2024 14:14:42 EDT Ventricular Rate:  90 PR Interval:  145 QRS Duration:  81 QT Interval:  373 QTC Calculation: 457 R Axis:   68  Text Interpretation: Sinus rhythm Confirmed by Carlyle Mcelrath ([id number]) on 04/25/2024 2:24:20 PM         Medicines ordered and prescription drug management: Meds ordered this encounter  Medications   potassium chloride  SA (KLOR-CON  M) CR tablet 40 mEq   magnesium  oxide (MAG-OX) tablet 800 mg   potassium chloride  10 mEq in 100 mL IVPB   calcium  gluconate 1 g/ 50 mL sodium chloride  IVPB   albumin human 25 % solution 25 g   0.9 %  sodium chloride  infusion (Manually program via Guardrails IV Fluids)   potassium chloride  SA (KLOR-CON  M) CR tablet 40 mEq   potassium chloride  SA (KLOR-CON  M) 20 MEQ tablet    Sig: Take 1 tablet (20 mEq total) by mouth 2 (two) times daily for 7 days.    Dispense:  14 tablet    Refill:  0   calcium -vitamin D  (OSCAL WITH D) 500-5 MG-MCG tablet    Sig: Take 1 tablet by mouth daily with breakfast.    Dispense:  30 tablet    Refill:  0    -I have reviewed the patients home medicines and have made adjustments as needed  Critical interventions Packed red blood cell administration, electrolyte repletion   Cardiac Monitoring: The patient was maintained on a cardiac monitor.  I  personally viewed and interpreted the cardiac monitored which showed an underlying rhythm of: NSR  Social Determinants of Health:  Factors impacting patients care include: Daily alcohol use, encouraged cessation   Reevaluation: After the interventions noted above, I reevaluated the patient and found that they have :improved  Co morbidities that complicate the patient evaluation  Past Medical History:  Diagnosis Date   Abnormal Pap smear    Alcohol use    Allergic rhinitis    BV (bacterial vaginosis)    Closed left ankle fracture    GERD (gastroesophageal reflux disease)    Hepatitis    LFT's >1000 (2019) due to methotrexate vs alcohol   High cholesterol    HSV-2 (herpes simplex virus 2) infection    Hypertension    Hypothyroidism 10/17/2019   Pancreatitis    12/19 (thought to be due to alcohol)   Rheumatoid arthritis (HCC)  Seizures (HCC)    dx as child with epilepsy but no seizures since 89 and on no meds   Vaginal Pap smear, abnormal       Dispostion: I considered admission for this patient, and although I did ask the patient to stay in the hospital for electrolyte repletion and gastroenterology evaluation, we used decision-making patient will follow closely in the outpatient setting with strict return precautions of which she voiced understanding.     Final Clinical Impression(s) / ED Diagnoses Final diagnoses:  Hypocalcemia  Hypokalemia  Anemia, unspecified type     @PCDICTATION @    Karlyn Overman, MD 04/26/24 [id number]

## 2024-04-25 NOTE — ED Triage Notes (Signed)
 Pt ambulatory to triage. Pt reports that she had abnormal labs and was sent by PCP. Pt reports that he calcium , vitamin D  and potassium were low.  Pt reports that she has been sick over the past few weeks, and hasn't been able to tolerate PO intake.  Pt denies any symptoms at this time. Denies nausea, vomiting, diarrhea, chest pain, shortness of breath and muscle cramps.

## 2024-04-26 LAB — TYPE AND SCREEN
ABO/RH(D): A POS
Antibody Screen: NEGATIVE
Unit division: 0

## 2024-04-26 LAB — BPAM RBC
Blood Product Expiration Date: 202506172359
ISSUE DATE / TIME: 202505231921
Unit Type and Rh: 5100

## 2024-04-27 LAB — I-STAT CHEM 8, ED
BUN: <3 mg/dL — ABNORMAL LOW (ref 6–20)
BUN: <3 mg/dL — ABNORMAL LOW (ref 6–20)
Calcium, Ion: 0.93 mmol/L — ABNORMAL LOW (ref 1.15–1.40)
Calcium, Ion: 0.94 mmol/L — ABNORMAL LOW (ref 1.15–1.40)
Chloride: 103 mmol/L (ref 98–111)
Chloride: 103 mmol/L (ref 98–111)
Creatinine, Ser: 0.6 mg/dL (ref 0.44–1.00)
Creatinine, Ser: 0.6 mg/dL (ref 0.44–1.00)
Glucose, Bld: 66 mg/dL — ABNORMAL LOW (ref 70–99)
Glucose, Bld: 68 mg/dL — ABNORMAL LOW (ref 70–99)
HCT: 19 % — ABNORMAL LOW (ref 36.0–46.0)
HCT: 20 % — ABNORMAL LOW (ref 36.0–46.0)
Hemoglobin: 6.5 g/dL — CL (ref 12.0–15.0)
Hemoglobin: 6.8 g/dL — CL (ref 12.0–15.0)
Potassium: 2.9 mmol/L — ABNORMAL LOW (ref 3.5–5.1)
Potassium: 3 mmol/L — ABNORMAL LOW (ref 3.5–5.1)
Sodium: 140 mmol/L (ref 135–145)
Sodium: 140 mmol/L (ref 135–145)
TCO2: 21 mmol/L — ABNORMAL LOW (ref 22–32)
TCO2: 21 mmol/L — ABNORMAL LOW (ref 22–32)

## 2024-04-28 LAB — PARATHYROID HORMONE, INTACT (NO CA): PTH: 21 pg/mL (ref 15–65)

## 2024-04-29 NOTE — Progress Notes (Signed)
 Referring Provider: Del Orbe Polanco, Ilian* Primary Care Physician:  Rosanna Comment, FNP Primary GI Physician: Dr. Riley Cheadle  Chief Complaint  Patient presents with   Follow-up    Follow up from ED.     HPI:   Samantha Rosales is a 43 y.o. female with history of chronic alcohol use, elevated LFTs, HTN, rheumatoid arthritis, seizures, anemia, presenting today for follow-up of generalized abdominal pain, change in bowel habits, diarrhea, rectal bleeding, suspected cirrhosis noted on recent CT, and acute on chronic anemia.   Notably, patient has chronic history of elevated LFTs. Prior extensive serologic workup in 2019 with positive ANA with homogenous pattern, 1:160 titer felt to be secondary to underlying RA.  Other labs including hepatitis A, B, C, ASMA, AMA, LKM1 ab, IgG, celiac screen without significant abnormality.  She has been found to have elevated ferritin, but hemochromatosis DNA was negative.  Previously felt elevated LFTs were secondary to chronic alcohol abuse +/- methotrexate.     Patient was seen in the office at the time of initial consult 04/23/24. She reported acute onset diarrhea around early May which seemed to improve, but has had loose stools since. Now with generalized abdominal pain and mild distension/bloating x 1 week and continues with frequent loose small volume stools including nocturnal stools. Also with single episode of toilet tissue hematochezia recently, but this had occurred a couple times in the past as well. No prior colonoscopy.  Continued to drink alcohol daily.  Recommended labs, stool testing, CT.  Counseled on the importance of alcohol cessation.  CT 04/24/2024:  Mild mural thickening of the rectosigmoid colon and mild mural thickening of the ascending colon that could represent portal colopathy versus colitis.  With left upper quadrant collateral vessels suggestive of sequela of portal hypertension.  Also noted cholelithiasis, sequela of chronic  pancreatitis with dilation of main pancreatic duct in the neck and body measuring up to 8 mm upstream to a coarse intraductal stone measuring 10 mm in the pancreatic head.  Labs and stool testing I ordered were not completed.  Blood work by her PCP on 5/22 that showed calcium  6.7, potassium 2.8, vitamin D  low at 10.4, ferritin 437.  Patient was seen in the emergency room at [facility] on 04/25/2024.  She had calcium  6.4, potassium 2.7, AST 64, alk phos 173, hemoglobin 6.8, FOBT positive.  B12 and folate within normal limits.,  Calcium  gluconate, albumin , 1 unit PRBCs.  Recommended hospital admission, but patient declined.   Today:  Abdominal pain/diarrhea/colon wall thickening on CT: Feeling much better. Symptoms seemed to improve on their own.  Abdominal pain essentially resolved.  Still having soft stools. At least 5 Bms per day. Had nocturnal stool last night, but she ate a salad for dinner which tends to cause more bowel movements.   States since she was a little girl she always had to have bowel movements while she was eating, but had formed stool. Typically with 3 BMs per day at baseline.  No rectal bleeding since I saw her last time.   No Fhx of IBD or colon cancer.   No nausea, vomiting.   Anemia:  No iron  deficiency per prior lab evaluation.  Ferritin has been elevated. No B12 or folate deficiency.   Started taking iron  daily a couple months ago.   Rare ibuprofen .  Prior EGD:  None.  Prior colonoscopy: None   ?Cirrhosis:  Ascites/peripheral edema: None.  Diuretics: None Encephalopathy:  None. Denies confusion, mental  status change, trouble thinking.  Paracentesis: Never Hep A/B vaccination: No immunity to Hep B in 2019. Unknown Hep A status   Chronic Pancreatitis:  States she was aware of this diagnosis. Doesn't have chronic abdominal pain.     Past Medical History:  Diagnosis Date   Abnormal Pap smear    Alcohol use    Allergic rhinitis    BV  (bacterial vaginosis)    Closed left ankle fracture    GERD (gastroesophageal reflux disease)    Hepatitis    LFT's >1000 (2019) due to methotrexate vs alcohol   High cholesterol    HSV-2 (herpes simplex virus 2) infection    Hypertension    Hypothyroidism 10/17/2019   Pancreatitis    12/19 (thought to be due to alcohol)   Rheumatoid arthritis (HCC)    Seizures (HCC)    dx as child with epilepsy but no seizures since 89 and on no meds   Vaginal Pap smear, abnormal     Past Surgical History:  Procedure Laterality Date   arm fracture     CERVICAL CONIZATION W/BX  12/20/2012   Procedure: CONIZATION CERVIX WITH BIOPSY;  Surgeon: Wendelyn Halter, MD;  Location: [facility];  Service: Gynecology;  Laterality: N/A;   FRACTURE SURGERY Left 2024   left arm   FRACTURE SURGERY Left 2022   left fracture   HOLMIUM LASER APPLICATION  12/20/2012   Procedure: HOLMIUM LASER APPLICATION;  Surgeon: Wendelyn Halter, MD;  Location: [facility];  Service: Gynecology;  Laterality: N/A;    Current Outpatient Medications  Medication Sig Dispense Refill   acetaminophen  (TYLENOL ) 325 MG tablet Take 2 tablets (650 mg total) by mouth every 6 (six) hours as needed. 36 tablet 0   CALCIUM  PO Take 1 tablet by mouth daily.     calcium -vitamin D  (OSCAL WITH D) 500-5 MG-MCG tablet Take 1 tablet by mouth daily with breakfast. 30 tablet 0   cholecalciferol (VITAMIN D3) 25 MCG (1000 UNIT) tablet Take 1 tablet (1,000 Units total) by mouth daily. 90 tablet 1   escitalopram  (LEXAPRO ) 10 MG tablet Take 1 tablet (10 mg total) by mouth daily. 90 tablet 3   fluticasone  (FLONASE ) 50 MCG/ACT nasal spray Place 2 sprays into both nostrils as needed for allergies or rhinitis. 9.9 mL 2   gabapentin  (NEURONTIN ) 300 MG capsule Take 1 capsule (300 mg total) by mouth daily as needed. 30 capsule 3   ibuprofen  (ADVIL ) 600 MG tablet Take 1 tablet (600 mg total) by mouth every 6 (six) hours as needed. Take medication with food to prevent upset  stomach 30 tablet 1   iron  polysaccharides (NU-IRON ) 150 MG capsule Take 1 capsule (150 mg total) by mouth daily. 30 capsule 3   losartan  (COZAAR ) 25 MG tablet Take 1 tablet (25 mg total) by mouth daily. 90 tablet 1   MAGNESIUM  PO Take 1 tablet by mouth daily.     Multiple Vitamin (MULTIVITAMIN WITH MINERALS) TABS tablet Take 1 tablet by mouth daily.     norethindrone  (AYGESTIN ) 5 MG tablet Take by mouth daily.     Omega 3 1000 MG CAPS Take 1 capsule (1,000 mg total) by mouth 2 (two) times daily. 60 capsule 3   ondansetron  (ZOFRAN ) 4 MG tablet Take 1 tablet (4 mg total) by mouth every 8 (eight) hours as needed for nausea or vomiting. 20 tablet 1   potassium chloride  (KLOR-CON  M) 10 MEQ tablet Take 1 tablet (10 mEq total) by mouth daily. 30 tablet  1   potassium chloride  SA (KLOR-CON  M) 20 MEQ tablet Take 1 tablet (20 mEq total) by mouth 2 (two) times daily for 7 days. 14 tablet 0   traZODone  (DESYREL ) 50 MG tablet Take 1 tablet (50 mg total) by mouth at bedtime. 30 tablet 2   triamcinolone (KENALOG) 0.1 % paste Use as directed 1 Application in the mouth or throat 3 (three) times daily.     No current facility-administered medications for this visit.    Allergies as of 05/01/2024 - Review Complete 05/01/2024  Allergen Reaction Noted   Penicillins Hives and Rash 12/13/2012    Family History  Problem Relation Age of Onset   Hypertension Mother    Hypertension Father    Pancreatic cancer Paternal Uncle    Colon cancer Neg Hx    Esophageal cancer Neg Hx    Inflammatory bowel disease Neg Hx    Liver disease Neg Hx    Rectal cancer Neg Hx    Stomach cancer Neg Hx     Social History   Socioeconomic History   Marital status: Married    Spouse name: Not on file   Number of children: 2   Years of education: Not on file   Highest education level: Associate degree: occupational, Scientist, product/process development, or vocational program  Occupational History   Occupation: [employer]     Employer: [employer]   Tobacco Use   Smoking status: Every Day    Current packs/day: 0.50    Average packs/day: 0.5 packs/day for 15.0 years (7.5 ttl pk-yrs)    Types: Cigarettes   Smokeless tobacco: Never  Vaping Use   Vaping status: Never Used  Substance and Sexual Activity   Alcohol use: Yes    Alcohol/week: 31.0 standard drinks of alcohol    Types: 3 Cans of beer, 28 Standard drinks or equivalent per week    Comment: 2-3 seltzer beer daily   Drug use: No   Sexual activity: Not Currently    Birth control/protection: Condom  Other Topics Concern   Not on file  Social History Narrative   Not on file   Social Drivers of Health   Financial Resource Strain: Medium Risk (01/09/2024)   Overall Financial Resource Strain (CARDIA)    Difficulty of Paying Living Expenses: Somewhat hard  Food Insecurity: No Food Insecurity (01/09/2024)   Hunger Vital Sign    Worried About Running Out of Food in the Last Year: Never true    Ran Out of Food in the Last Year: Never true  Transportation Needs: No Transportation Needs (01/09/2024)   PRAPARE - Administrator, Civil Service (Medical): No    Lack of Transportation (Non-Medical): No  Physical Activity: Inactive (01/09/2024)   Exercise Vital Sign    Days of Exercise per Week: 0 days    Minutes of Exercise per Session: 0 min  Stress: Stress Concern Present (01/09/2024)   [employer] of Occupational Health - Occupational Stress Questionnaire    Feeling of Stress : To some extent  Social Connections: Moderately Isolated (01/09/2024)   Social Connection and Isolation Panel [NHANES]    Frequency of Communication with Friends and Family: More than three times a week    Frequency of Social Gatherings with Friends and Family: Twice a week    Attends Religious Services: Never    Database administrator or Organizations: No    Attends Banker Meetings: Never    Marital Status: Married    Review of  Systems: Gen: Denies  fever, chills, cold or flulike symptoms, presyncope, syncope. CV: Denies chest pain, palpitations. Resp: Denies dyspnea, cough. GI: See HPI Heme: See HPI  Physical Exam: BP 137/87 (BP Location: Right Arm, Patient Position: Sitting, Cuff Size: Normal)   Pulse 94   Temp 97.9 F (36.6 C) (Temporal)   Ht 5\' 8"  (1.727 m)   Wt 107 lb 3.2 oz (48.6 kg)   LMP 03/20/2024   BMI 16.30 kg/m  General:   Alert and oriented. No distress noted. Pleasant and cooperative.  Head:  Normocephalic and atraumatic. Eyes:  Conjuctiva clear without scleral icterus. Heart:  S1, S2 present without murmurs appreciated. Lungs:  Clear to auscultation bilaterally. No wheezes, rales, or rhonchi. No distress.  Abdomen:  +BS, soft, non-tender and non-distended. No rebound or guarding. No HSM or masses noted. Msk:  Symmetrical without gross deformities. Normal posture. Extremities:  Without edema. Neurologic:  Alert and  oriented x4 Psych:  Normal mood and affect.    Assessment:  43 y.o. female with history of chronic alcohol use, elevated LFTs, HTN, RA, seizures, anemia, presenting today for follow-up of generalized abdominal pain, change in bowel habits with diarrhea, rectal bleeding, suspected cirrhosis on recent CT, and acute on chronic anemia.  Change in bowel habits/diarrhea/generalized abdominal pain/colon wall thickening on CT: Acute change in bowel habits from 3 formed BMs daily to multiple diarrhea type stools in early May.  Found to have mild mural thickening of the rectosigmoid colon and mild mural thickening of the ascending colon suspicious for colitis in the setting of acute change in bowel habits.  Stool studies never completed and patient now reporting spontaneous resolution of all abdominal pain and some improvement in stool consistency though still having multiple soft stools per day.  I suspect that she may currently be dealing with a component of postinfectious IBS, but recommended that she go ahead  and complete stool studies as previously ordered.  We will proceed with a colonoscopy in about 6 weeks to follow-up on suspected colitis.  Rectal bleeding: Few episodes of scant toilet tissue hematochezia over the last several months.  Etiology unclear.  May be from benign anorectal source such as hemorrhoids, but unable to rule out malignancy as she has never had a colonoscopy.  This will be arranged in the near future.  Acute on chronic anemia: Chronic anemia dating back to 2020.  Recent baseline hemoglobin has been in the 9 range without any evidence of B12, folate, or iron  deficiency.  Ferritin has been elevated, possibly secondary to RA or chronic ETOH. Started oral iron  a couple months ago. Recently found to have acute on chronic anemia with Hgb down to 6.5 on 04/25/2024.  Has only ever reported a few episodes of scant toilet tissue hematochezia over the last few months, no other GI bleeding or obvious blood loss.  Received 1 unit PRBCs in the ER on 5/23.  She has never had EGD or colonoscopy.  Rare NSAID use.  Etiology of her anemia is not clear.  Chronic anemia could be related to chronic disease, RA, but unable to rule out other abnormalities.  She needs a colonoscopy to evaluate rectal bleeding as she has never had 1.  Also recommend an upper endoscopy to evaluate for sequela of portal hypertension as CT notes left upper quadrant collateral vessels suggestive of sequela of portal hypertension. I am also referring her to hematology.   Possible cirrhosis/elevated LFTs: Known history of hepatic steatosis.  Recent CT A/P with contrast  with left upper quadrant collateral vessels suggesting sequela of portal hypertension raising the concern for underlying cirrhosis.  She does have chronically elevated LFTs in the setting of daily alcohol use, AST > ALT.  Prior extensive serologic workup in 2019 with positive ANA with homogenous pattern, 1:160 titer felt to be secondary to underlying RA.  Other labs  including hepatitis A, B, C, ASMA, AMA, LKM1 ab, IgG, celiac screen without significant abnormality.  She has been found to have elevated ferritin, but hemochromatosis DNA was negative.    Clinically patient is doing well without symptoms of decompensation.   If patient does have cirrhosis, suspect ETOH etiology, but will plan to recheck IgG, ASMA as these were previously negative. If positive, would raise the question of autoimmune hepatitis as well. Will also check ceruloplasmin and alpha 1 antitrypsin as these have never been checked. We discussed the possibility of liver biopsy, but patient preferred to hold off on this for now.   Will also update labs for MELD calculation, check immunity to Hep A/B, and arrange EGD to evaluate for sequela of portal hypertension (PHG, esophageal varices).     Alcohol induced chronic pancreatitis: History of alcoholic pancreatitis in December 2019.  CT A/P with contrast 04/24/2024 with diffusely atrophic pancreas with dilation of the main pancreatic duct in the neck and body measuring up to 8 mm upstream to a course intraductal stone measuring 10 mm in the pancreatic head.  Clinically, patient is doing well.  Denies any issues with chronic abdominal pain, nausea, vomiting.  She has still been drinking alcohol on a daily basis, but states that she will stop after our discussion today.  I will reach out to [facility] to discuss role of intervention for pancreatic duct stone.  Hypocalcemia/hypokalemia: Noted on recent labs likely secondary to diarrhea s/p supplementation.  Will recheck labs at this time.     Plan:  CBC, CMP, INR, hepatitis A antibody total, hepatitis B surface antibody, ceruloplasmin, alpha-1 antitrypsin phenotype, IgG, ASMA, C. difficile GDH and toxin A/B, GI pathogen panel Proceed with colonoscopy _ EGD with propofol  by Dr. Riley Cheadle in near future. The risks, benefits, and alternatives have been discussed with the patient in detail. The patient  states understanding and desires to proceed.  ASA 3 Hold iron  x 7 days prior to procedures Referred to hematology for chronic anemia Avoid all NSAIDs. Counseled on the importance of strict alcohol abstinence.  Patient voiced understanding and stated she would stop all alcohol. I will discuss chronic pancreatitis and role of intervention for pancreatic duct stone with [facility].    Shana Daring, PA-C [facility] 05/01/2024

## 2024-05-01 ENCOUNTER — Ambulatory Visit (INDEPENDENT_AMBULATORY_CARE_PROVIDER_SITE_OTHER): Admitting: Gastroenterology

## 2024-05-01 ENCOUNTER — Encounter: Payer: Self-pay | Admitting: Gastroenterology

## 2024-05-01 VITALS — BP 137/87 | HR 94 | Temp 97.9°F | Ht 68.0 in | Wt 107.2 lb

## 2024-05-01 DIAGNOSIS — R197 Diarrhea, unspecified: Secondary | ICD-10-CM | POA: Diagnosis not present

## 2024-05-01 DIAGNOSIS — R933 Abnormal findings on diagnostic imaging of other parts of digestive tract: Secondary | ICD-10-CM

## 2024-05-01 DIAGNOSIS — F1090 Alcohol use, unspecified, uncomplicated: Secondary | ICD-10-CM

## 2024-05-01 DIAGNOSIS — K860 Alcohol-induced chronic pancreatitis: Secondary | ICD-10-CM

## 2024-05-01 DIAGNOSIS — R7989 Other specified abnormal findings of blood chemistry: Secondary | ICD-10-CM

## 2024-05-01 DIAGNOSIS — D649 Anemia, unspecified: Secondary | ICD-10-CM | POA: Diagnosis not present

## 2024-05-01 DIAGNOSIS — E876 Hypokalemia: Secondary | ICD-10-CM

## 2024-05-01 DIAGNOSIS — R1084 Generalized abdominal pain: Secondary | ICD-10-CM | POA: Diagnosis not present

## 2024-05-01 DIAGNOSIS — Z8719 Personal history of other diseases of the digestive system: Secondary | ICD-10-CM

## 2024-05-01 DIAGNOSIS — K625 Hemorrhage of anus and rectum: Secondary | ICD-10-CM | POA: Diagnosis not present

## 2024-05-01 DIAGNOSIS — K7030 Alcoholic cirrhosis of liver without ascites: Secondary | ICD-10-CM

## 2024-05-01 DIAGNOSIS — K7460 Unspecified cirrhosis of liver: Secondary | ICD-10-CM

## 2024-05-01 NOTE — Patient Instructions (Addendum)
 Please have labs and stool studies completed at [facility].   We will arrange for you to have an upper endoscopy and colonoscopy with Dr. Riley Cheadle at [facility]. You will need to hold iron  for 7 days prior to your procedures.  Avoid all NSAID products including ibuprofen , Aleve, Advil , BC powders, Goody powders, and anything that is coincidental on the package.  Due to your chronic anemia without evidence of iron , B12, or folate deficiency, I am placing a referral to hematology for you.  As we discussed, you have evidence of chronic pancreatitis under CT along with a stone in your pancreatic duct.  Going to discuss this with [facility] to see if we need to pursue ERCP for this.  As discussed, it is very important that you stop drinking alcohol completely.  I will plan to see you back in the office after your procedures, but will be in contact with your lab and stool study results.  Shana Daring, PA-C [facility] [place]

## 2024-05-06 LAB — CBC WITH DIFFERENTIAL/PLATELET
Absolute Lymphocytes: 2022 {cells}/uL (ref 850–3900)
Absolute Monocytes: 628 {cells}/uL (ref 200–950)
Basophils Absolute: 37 {cells}/uL (ref 0–200)
Basophils Relative: 0.5 %
Eosinophils Absolute: 51 {cells}/uL (ref 15–500)
Eosinophils Relative: 0.7 %
HCT: 32.6 % — ABNORMAL LOW (ref 35.0–45.0)
Hemoglobin: 10.3 g/dL — ABNORMAL LOW (ref 11.7–15.5)
MCH: 31.3 pg (ref 27.0–33.0)
MCHC: 31.6 g/dL — ABNORMAL LOW (ref 32.0–36.0)
MCV: 99.1 fL (ref 80.0–100.0)
MPV: 10.8 fL (ref 7.5–12.5)
Monocytes Relative: 8.6 %
Neutro Abs: 4563 {cells}/uL (ref 1500–7800)
Neutrophils Relative %: 62.5 %
Platelets: 222 10*3/uL (ref 140–400)
RBC: 3.29 10*6/uL — ABNORMAL LOW (ref 3.80–5.10)
RDW: 13.3 % (ref 11.0–15.0)
Total Lymphocyte: 27.7 %
WBC: 7.3 10*3/uL (ref 3.8–10.8)

## 2024-05-06 LAB — COMPLETE METABOLIC PANEL WITHOUT GFR
AG Ratio: 0.9 (calc) — ABNORMAL LOW (ref 1.0–2.5)
ALT: 19 U/L (ref 6–29)
AST: 58 U/L — ABNORMAL HIGH (ref 10–30)
Albumin: 3.7 g/dL (ref 3.6–5.1)
Alkaline phosphatase (APISO): 191 U/L — ABNORMAL HIGH (ref 31–125)
BUN/Creatinine Ratio: 9 (calc) (ref 6–22)
BUN: 6 mg/dL — ABNORMAL LOW (ref 7–25)
CO2: 22 mmol/L (ref 20–32)
Calcium: 8.9 mg/dL (ref 8.6–10.2)
Chloride: 105 mmol/L (ref 98–110)
Creat: 0.64 mg/dL (ref 0.50–0.99)
Globulin: 3.9 g/dL — ABNORMAL HIGH (ref 1.9–3.7)
Glucose, Bld: 78 mg/dL (ref 65–99)
Potassium: 4.5 mmol/L (ref 3.5–5.3)
Sodium: 138 mmol/L (ref 135–146)
Total Bilirubin: 0.8 mg/dL (ref 0.2–1.2)
Total Protein: 7.6 g/dL (ref 6.1–8.1)

## 2024-05-06 LAB — IGG: IgG (Immunoglobin G), Serum: 1681 mg/dL — ABNORMAL HIGH (ref 600–1640)

## 2024-05-06 LAB — ALPHA-1 ANTITRYPSIN PHENOTYPE: A-1 Antitrypsin, Ser: 187 mg/dL (ref 83–199)

## 2024-05-06 LAB — HEPATITIS A ANTIBODY, TOTAL: Hepatitis A AB,Total: REACTIVE — AB

## 2024-05-06 LAB — CERULOPLASMIN: Ceruloplasmin: 23 mg/dL (ref 14–48)

## 2024-05-06 LAB — PROTIME-INR
INR: 1
Prothrombin Time: 10.7 s (ref 9.0–11.5)

## 2024-05-06 LAB — HEPATITIS B SURFACE ANTIBODY,QUALITATIVE: Hep B S Ab: NONREACTIVE

## 2024-05-06 LAB — ANTI-SMOOTH MUSCLE ANTIBODY, IGG: Actin (Smooth Muscle) Antibody (IGG): <20 U (ref <20)

## 2024-05-07 ENCOUNTER — Other Ambulatory Visit: Payer: Self-pay | Admitting: Family Medicine

## 2024-05-07 ENCOUNTER — Inpatient Hospital Stay: Payer: Self-pay

## 2024-05-07 ENCOUNTER — Other Ambulatory Visit: Payer: Self-pay | Admitting: *Deleted

## 2024-05-07 ENCOUNTER — Encounter: Payer: Self-pay | Admitting: *Deleted

## 2024-05-07 DIAGNOSIS — D649 Anemia, unspecified: Secondary | ICD-10-CM

## 2024-05-08 ENCOUNTER — Inpatient Hospital Stay: Payer: Self-pay

## 2024-05-09 ENCOUNTER — Ambulatory Visit: Payer: Self-pay | Admitting: Gastroenterology

## 2024-05-09 ENCOUNTER — Telehealth: Payer: Self-pay | Admitting: Gastroenterology

## 2024-05-09 NOTE — Telephone Encounter (Signed)
 Reviewed chronic pancreatitis with pancreatic duct dilation and intraductal stone with Dr. Brice Campi who recommended she would at least need an EUS to evaluate and ensure the ductal dilation is occurring from issues related to the stone itself and nothing else.  No need for ERCP unless patient is having significant issues or there becomes further progression regards to the ductal dilation and if confirmed just a stone.  He also recommended checking fecal fat and fecal elastase.  As patient is not having any significant upper abdominal pain, nausea, vomiting, no need for ERCP at this point.  I tried calling patient today to discuss recommendations regarding EUS as well as fecal fat/fecal elastase testing.  Left message on machine requesting return call.   Jearlean Mince, just sending this to you as FYI in case patient returns call while I am on vacation next week.

## 2024-05-12 NOTE — Telephone Encounter (Signed)
 Noted

## 2024-05-13 ENCOUNTER — Inpatient Hospital Stay: Admitting: Hematology

## 2024-05-13 ENCOUNTER — Inpatient Hospital Stay

## 2024-05-13 NOTE — Telephone Encounter (Signed)
 LMOVM to call back to schedule TCS/EGD with Dr. Riley Cheadle, ASA 3 6 weeks from last OV, no iron  x 7 days

## 2024-05-14 ENCOUNTER — Encounter: Payer: Self-pay | Admitting: Family Medicine

## 2024-05-17 ENCOUNTER — Other Ambulatory Visit: Payer: Self-pay | Admitting: Family Medicine

## 2024-05-18 ENCOUNTER — Other Ambulatory Visit: Payer: Self-pay | Admitting: Family Medicine

## 2024-05-19 ENCOUNTER — Inpatient Hospital Stay: Admitting: Hematology

## 2024-05-19 ENCOUNTER — Inpatient Hospital Stay

## 2024-05-19 NOTE — Progress Notes (Incomplete)
 [facility] [address] [place] [address]   Clinic Day:  05/19/2024  Referring physician: Evander Hills, PA-C  Patient Care Team: Del Amber Bail, Rogerio Clay, FNP as PCP - General (Family Medicine)   ASSESSMENT & PLAN:   Assessment: ***  Plan: ***  No orders of the defined types were placed in this encounter.     Nadeen Augusta Teague,acting as a Neurosurgeon for Paulett Boros, MD.,have documented all relevant documentation on the behalf of Paulett Boros, MD,as directed by  Paulett Boros, MD while in the presence of Paulett Boros, MD.   ***  Nimmons R Teague   6/16/20257:31 AM  CHIEF COMPLAINT/PURPOSE OF CONSULT:   Diagnosis: ***  Current Therapy:  ***  HISTORY OF PRESENT ILLNESS:   Samantha Rosales is a 43 y.o. female presenting to clinic today for evaluation of anemia at the request of Evander Hills, PA-C.  Patient has a medical history of hypertension, hepatic steatosis, rheumatoid arthritis, and amenorrhea.   [name] was seen by her gastroenterologist on 05/01/24 after presenting to the ED on *** and was noted to have anemia at that time. She has reported had a few episodes of hematochezia over the last several months per PA Harper's note. She has a history of anemia dating back to 2020. Her most recent CBC diff from 05/01/24 showed low RBC at 3.29, low HGB at 10.3, low HCT at 32.6, and low MCHC at 31.6.   Today, she states that she is doing well overall. Her appetite level is at ***%. Her energy level is at ***%.   PAST MEDICAL HISTORY:   Past Medical History: Past Medical History:  Diagnosis Date   Abnormal Pap smear    Alcohol use    Allergic rhinitis    BV (bacterial vaginosis)    Closed left ankle fracture    GERD (gastroesophageal reflux disease)    Hepatitis    LFT's >1000 (2019) due to methotrexate vs alcohol   High cholesterol    HSV-2 (herpes simplex virus 2) infection    Hypertension    Hypothyroidism  10/17/2019   Pancreatitis    12/19 (thought to be due to alcohol)   Rheumatoid arthritis (HCC)    Seizures (HCC)    dx as child with epilepsy but no seizures since 89 and on no meds   Vaginal Pap smear, abnormal     Surgical History: Past Surgical History:  Procedure Laterality Date   arm fracture     CERVICAL CONIZATION W/BX  12/20/2012   Procedure: CONIZATION CERVIX WITH BIOPSY;  Surgeon: Wendelyn Halter, MD;  Location: [facility];  Service: Gynecology;  Laterality: N/A;   FRACTURE SURGERY Left 2024   left arm   FRACTURE SURGERY Left 2022   left fracture   HOLMIUM LASER APPLICATION  12/20/2012   Procedure: HOLMIUM LASER APPLICATION;  Surgeon: Wendelyn Halter, MD;  Location: [facility];  Service: Gynecology;  Laterality: N/A;    Social History: Social History   Socioeconomic History   Marital status: Married    Spouse name: Not on file   Number of children: 2   Years of education: Not on file   Highest education level: Associate degree: occupational, Scientist, product/process development, or vocational program  Occupational History   Occupation: [employer]    Employer: [employer]   Tobacco Use   Smoking status: Every Day    Current packs/day: 0.50    Average packs/day: 0.5 packs/day for 15.0 years (7.5 ttl pk-yrs)  Types: Cigarettes   Smokeless tobacco: Never  Vaping Use   Vaping status: Never Used  Substance and Sexual Activity   Alcohol use: Yes    Alcohol/week: 31.0 standard drinks of alcohol    Types: 3 Cans of beer, 28 Standard drinks or equivalent per week    Comment: 2-3 seltzer beer daily   Drug use: No   Sexual activity: Not Currently    Birth control/protection: Condom  Other Topics Concern   Not on file  Social History Narrative   Not on file   Social Drivers of Health   Financial Resource Strain: Medium Risk (01/09/2024)   Overall Financial Resource Strain (CARDIA)    Difficulty of Paying Living Expenses: Somewhat hard  Food Insecurity: No Food Insecurity  (01/09/2024)   Hunger Vital Sign    Worried About Running Out of Food in the Last Year: Never true    Ran Out of Food in the Last Year: Never true  Transportation Needs: No Transportation Needs (01/09/2024)   PRAPARE - Administrator, Civil Service (Medical): No    Lack of Transportation (Non-Medical): No  Physical Activity: Inactive (01/09/2024)   Exercise Vital Sign    Days of Exercise per Week: 0 days    Minutes of Exercise per Session: 0 min  Stress: Stress Concern Present (01/09/2024)   [employer] of Occupational Health - Occupational Stress Questionnaire    Feeling of Stress : To some extent  Social Connections: Moderately Isolated (01/09/2024)   Social Connection and Isolation Panel    Frequency of Communication with Friends and Family: More than three times a week    Frequency of Social Gatherings with Friends and Family: Twice a week    Attends Religious Services: Never    Database administrator or Organizations: No    Attends Banker Meetings: Never    Marital Status: Married  Catering manager Violence: Not At Risk (06/29/2023)   Humiliation, Afraid, Rape, and Kick questionnaire    Fear of Current or Ex-Partner: No    Emotionally Abused: No    Physically Abused: No    Sexually Abused: No    Family History: Family History  Problem Relation Age of Onset   Hypertension Mother    Hypertension Father    Pancreatic cancer Paternal Uncle    Colon cancer Neg Hx    Esophageal cancer Neg Hx    Inflammatory bowel disease Neg Hx    Liver disease Neg Hx    Rectal cancer Neg Hx    Stomach cancer Neg Hx     Current Medications:  Current Outpatient Medications:    acetaminophen  (TYLENOL ) 325 MG tablet, Take 2 tablets (650 mg total) by mouth every 6 (six) hours as needed., Disp: 36 tablet, Rfl: 0   CALCIUM  PO, Take 1 tablet by mouth daily., Disp: , Rfl:    calcium -vitamin D  (OSCAL WITH D) 500-5 MG-MCG tablet, Take 1 tablet by mouth daily with breakfast.,  Disp: 30 tablet, Rfl: 0   cholecalciferol (VITAMIN D3) 25 MCG (1000 UNIT) tablet, Take 1 tablet (1,000 Units total) by mouth daily., Disp: 90 tablet, Rfl: 1   escitalopram  (LEXAPRO ) 10 MG tablet, Take 1 tablet (10 mg total) by mouth daily., Disp: 90 tablet, Rfl: 3   fluticasone  (FLONASE ) 50 MCG/ACT nasal spray, Place 2 sprays into both nostrils as needed for allergies or rhinitis., Disp: 9.9 mL, Rfl: 2   gabapentin  (NEURONTIN ) 300 MG capsule, Take 1 capsule (300 mg total) by mouth  daily as needed., Disp: 30 capsule, Rfl: 3   ibuprofen  (ADVIL ) 600 MG tablet, Take 1 tablet (600 mg total) by mouth every 6 (six) hours as needed. Take medication with food to prevent upset stomach, Disp: 30 tablet, Rfl: 1   iron  polysaccharides (NU-IRON ) 150 MG capsule, Take 1 capsule (150 mg total) by mouth daily., Disp: 30 capsule, Rfl: 3   losartan  (COZAAR ) 25 MG tablet, Take 1 tablet (25 mg total) by mouth daily., Disp: 90 tablet, Rfl: 1   MAGNESIUM  PO, Take 1 tablet by mouth daily., Disp: , Rfl:    Multiple Vitamin (MULTIVITAMIN WITH MINERALS) TABS tablet, Take 1 tablet by mouth daily., Disp: , Rfl:    norethindrone  (AYGESTIN ) 5 MG tablet, Take by mouth daily., Disp: , Rfl:    Omega 3 1000 MG CAPS, Take 1 capsule (1,000 mg total) by mouth 2 (two) times daily., Disp: 60 capsule, Rfl: 3   ondansetron  (ZOFRAN ) 4 MG tablet, Take 1 tablet (4 mg total) by mouth every 8 (eight) hours as needed for nausea or vomiting., Disp: 20 tablet, Rfl: 1   potassium chloride  (KLOR-CON  M) 10 MEQ tablet, Take 1 tablet (10 mEq total) by mouth daily., Disp: 30 tablet, Rfl: 1   potassium chloride  SA (KLOR-CON  M) 20 MEQ tablet, Take 1 tablet (20 mEq total) by mouth 2 (two) times daily for 7 days., Disp: 14 tablet, Rfl: 0   traZODone  (DESYREL ) 50 MG tablet, Take 1 tablet (50 mg total) by mouth at bedtime., Disp: 30 tablet, Rfl: 2   triamcinolone (KENALOG) 0.1 % paste, Use as directed 1 Application in the mouth or throat 3 (three) times daily.,  Disp: , Rfl:    Allergies: Allergies  Allergen Reactions   Penicillins Hives and Rash    REVIEW OF SYSTEMS:   Review of Systems  Constitutional:  Negative for chills, fatigue and fever.  HENT:   Negative for lump/mass, mouth sores, nosebleeds, sore throat and trouble swallowing.   Eyes:  Negative for eye problems.  Respiratory:  Negative for cough and shortness of breath.   Cardiovascular:  Negative for chest pain, leg swelling and palpitations.  Gastrointestinal:  Negative for abdominal pain, constipation, diarrhea, nausea and vomiting.  Genitourinary:  Negative for bladder incontinence, difficulty urinating, dysuria, frequency, hematuria and nocturia.   Musculoskeletal:  Negative for arthralgias, back pain, flank pain, myalgias and neck pain.  Skin:  Negative for itching and rash.  Neurological:  Negative for dizziness, headaches and numbness.  Hematological:  Does not bruise/bleed easily.  Psychiatric/Behavioral:  Negative for depression, sleep disturbance and suicidal ideas. The patient is not nervous/anxious.   All other systems reviewed and are negative.    VITALS:   Last menstrual period 03/20/2024.  Wt Readings from Last 3 Encounters:  05/01/24 107 lb 3.2 oz (48.6 kg)  04/25/24 112 lb (50.8 kg)  04/24/24 112 lb (50.8 kg)    There is no height or weight on file to calculate BMI.   PHYSICAL EXAM:   Physical Exam Vitals and nursing note reviewed. Exam conducted with a chaperone present.  Constitutional:      Appearance: Normal appearance.   Cardiovascular:     Rate and Rhythm: Normal rate and regular rhythm.     Pulses: Normal pulses.     Heart sounds: Normal heart sounds.  Pulmonary:     Effort: Pulmonary effort is normal.     Breath sounds: Normal breath sounds.  Abdominal:     Palpations: Abdomen is soft. There is no  hepatomegaly, splenomegaly or mass.     Tenderness: There is no abdominal tenderness.   Musculoskeletal:     Right lower leg: No edema.      Left lower leg: No edema.  Lymphadenopathy:     Cervical: No cervical adenopathy.     Right cervical: No superficial, deep or posterior cervical adenopathy.    Left cervical: No superficial, deep or posterior cervical adenopathy.     Upper Body:     Right upper body: No supraclavicular or axillary adenopathy.     Left upper body: No supraclavicular or axillary adenopathy.   Neurological:     General: No focal deficit present.     Mental Status: She is alert and oriented to person, place, and time.   Psychiatric:        Mood and Affect: Mood normal.        Behavior: Behavior normal.     LABS:   CBC    Component Value Date/Time   WBC 7.3 05/01/2024 1124   RBC 3.29 (L) 05/01/2024 1124   HGB 10.3 (L) 05/01/2024 1124   HGB 9.9 (L) 02/14/2024 0811   HCT 32.6 (L) 05/01/2024 1124   HCT 30.4 (L) 02/14/2024 0811   PLT 222 05/01/2024 1124   PLT 211 02/14/2024 0811   MCV 99.1 05/01/2024 1124   MCV 99 (H) 02/14/2024 0811   MCH 31.3 05/01/2024 1124   MCHC 31.6 (L) 05/01/2024 1124   RDW 13.3 05/01/2024 1124   RDW 13.6 02/14/2024 0811   LYMPHSABS 1.8 04/25/2024 1353   LYMPHSABS 1.8 02/14/2024 0811   MONOABS 0.3 04/25/2024 1353   EOSABS 51 05/01/2024 1124   EOSABS 0.1 02/14/2024 0811   BASOSABS 37 05/01/2024 1124   BASOSABS 0.0 02/14/2024 0811    CMP    Component Value Date/Time   NA 138 05/01/2024 1124   NA 139 04/24/2024 0920   K 4.5 05/01/2024 1124   CL 105 05/01/2024 1124   CO2 22 05/01/2024 1124   GLUCOSE 78 05/01/2024 1124   BUN 6 (L) 05/01/2024 1124   BUN 3 (L) 04/24/2024 0920   CREATININE 0.64 05/01/2024 1124   CALCIUM  8.9 05/01/2024 1124   PROT 7.6 05/01/2024 1124   PROT 7.7 03/06/2023 1122   ALBUMIN  2.7 (L) 04/25/2024 1802   ALBUMIN  4.2 03/06/2023 1122   AST 58 (H) 05/01/2024 1124   ALT 19 05/01/2024 1124   ALKPHOS 146 (H) 04/25/2024 1802   BILITOT 0.8 05/01/2024 1124   BILITOT <0.2 03/06/2023 1122   GFRNONAA >60 04/25/2024 1802   GFRNONAA 117  03/29/2021 0839   GFRAA 135 03/29/2021 0839    No results found for: CEA1, CEA / No results found for: CEA1, CEA No results found for: PSA1 No results found for: ZOX096 No results found for: CAN125  No results found for: TOTALPROTELP, ALBUMINELP, A1GS, A2GS, BETS, BETA2SER, GAMS, MSPIKE, SPEI Lab Results  Component Value Date   TIBC 187 (L) 04/24/2024   TIBC 321 02/14/2024   FERRITIN 437 (H) 04/24/2024   FERRITIN 303 (H) 02/14/2024   FERRITIN 635.1 (H) 09/10/2018   IRONPCTSAT 20 04/24/2024   IRONPCTSAT 39 02/14/2024   IRONPCTSAT 41.6 09/10/2018   No results found for: LDH   STUDIES:   CT ABDOMEN PELVIS W CONTRAST Result Date: 04/24/2024 CLINICAL DATA:  Abdominal pain EXAM: CT ABDOMEN AND PELVIS WITH CONTRAST TECHNIQUE: Multidetector CT imaging of the abdomen and pelvis was performed using the standard protocol following bolus administration of intravenous contrast. RADIATION DOSE REDUCTION:  This exam was performed according to the departmental dose-optimization program which includes automated exposure control, adjustment of the mA and/or kV according to patient size and/or use of iterative reconstruction technique. CONTRAST:  OMNIPAQUE  IOHEXOL  300 MG/ML  SOLN COMPARISON:  CT abdomen and pelvis dated 08/26/2022 FINDINGS: Lower chest: No focal consolidation or pulmonary nodule in the lung bases. No pleural effusion or pneumothorax demonstrated. Partially imaged heart size is normal. Hepatobiliary: Diffuse parenchymal hypoattenuation can be seen with hepatic steatosis. No intra or extrahepatic biliary ductal dilation. Cholelithiasis. Pancreas: Diffusely atrophic pancreas with dilation of the main pancreatic duct in the neck and body measuring up to 8 mm upstream to a coarse intraductal stone measuring 10 mm in the pancreatic head (2:32). Spleen: Normal in size without focal abnormality. Adrenals/Urinary Tract: No adrenal nodules. No suspicious renal  mass, calculi or hydronephrosis. No focal bladder wall thickening. Stomach/Bowel: Normal appearance of the stomach. Mild mural thickening of the ascending colon. No abnormal bowel dilation. Mild mural thickening of the underdistended rectosigmoid colon. Normal appendix. Vascular/Lymphatic: Aortic atherosclerosis. Left upper quadrant collateral vessels. 14 mm right inguinal lymph node (2:87), increased in size from 9 mm. Reproductive: No adnexal masses. Other: Small volume free fluid.  No free air or fluid collection. Musculoskeletal: No acute or abnormal lytic or blastic osseous lesions. IMPRESSION: 1. Hepatic steatosis. 2. Small volume free fluid and left upper quadrant collateral vessels, suggestive of sequela of portal hypertension. In this setting, mild mural thickening of the ascending colon may represent portal colopathy or mild colitis. 3. Mild mural thickening of the rectosigmoid colon may be related to underdistention or colitis/colopathy. 4. Sequela of chronic pancreatitis as described. 5. Cholelithiasis. 6. Enlarged right inguinal lymph node, likely reactive. 7.  Aortic Atherosclerosis (ICD10-I70.0). Electronically Signed   By: Limin  Xu M.D.   On: 04/24/2024 14:43

## 2024-05-23 ENCOUNTER — Inpatient Hospital Stay

## 2024-05-23 ENCOUNTER — Inpatient Hospital Stay: Admitting: Oncology

## 2024-05-30 ENCOUNTER — Inpatient Hospital Stay: Attending: Oncology | Admitting: Oncology

## 2024-05-30 ENCOUNTER — Inpatient Hospital Stay

## 2024-06-09 ENCOUNTER — Telehealth: Payer: Self-pay | Admitting: *Deleted

## 2024-06-09 NOTE — Telephone Encounter (Signed)
 Pt called and states that she can't do stool studies. She states that she gags every time she tries. She would like to know her other options. She states her diarrhea comes and goes.

## 2024-06-09 NOTE — Telephone Encounter (Signed)
 Pt informed will be August before can get her scheduled. Will call once we get providers schedule.   Pt left vm wanting to schedule procedures.

## 2024-06-10 NOTE — Telephone Encounter (Signed)
 I was able to reach patient today.  She is agreeable to proceeding with EUS. Advised that Dr. Melba office would reach out to her to get this scheduled.

## 2024-06-10 NOTE — Telephone Encounter (Signed)
 Noted

## 2024-06-10 NOTE — Telephone Encounter (Signed)
 Spoke with patient.  Overall, diarrhea is much improved.  Not on a regular basis.  She may have exocrine pancreatic insufficiency contributing to her intermittent loose stools in the setting of chronic pancreatitis.   She used to be on Creon  and states this did help her, but she has not had Creon  in a while.  I will represcribe this for her.  She still needs to get her procedures scheduled.  I let her know that Mindy will be reaching out to her to get this scheduled.  States she will be expecting a call.

## 2024-06-10 NOTE — Telephone Encounter (Signed)
Dr Mansouraty please advise  

## 2024-06-10 NOTE — Telephone Encounter (Signed)
 Correct per former message she is aware we are awaiting August schedule

## 2024-06-12 ENCOUNTER — Other Ambulatory Visit: Payer: Self-pay

## 2024-06-12 DIAGNOSIS — K861 Other chronic pancreatitis: Secondary | ICD-10-CM

## 2024-06-12 DIAGNOSIS — K7460 Unspecified cirrhosis of liver: Secondary | ICD-10-CM

## 2024-06-12 NOTE — Telephone Encounter (Signed)
Great! Thank you both.

## 2024-06-12 NOTE — Telephone Encounter (Signed)
 EUS scheduled, pt instructed and medications reviewed.  Patient instructions mailed to home.  Patient to call with any questions or concerns.

## 2024-06-12 NOTE — Telephone Encounter (Signed)
 EUS has been set up for 08/26/24 at 8 am at [facility] with GM

## 2024-06-12 NOTE — Telephone Encounter (Signed)
 Upper EUS in the next 3 months is reasonable. Have discussed this with referring provider as well. GM

## 2024-06-18 ENCOUNTER — Ambulatory Visit (INDEPENDENT_AMBULATORY_CARE_PROVIDER_SITE_OTHER): Admitting: Otolaryngology

## 2024-06-18 ENCOUNTER — Encounter (INDEPENDENT_AMBULATORY_CARE_PROVIDER_SITE_OTHER): Payer: Self-pay | Admitting: Otolaryngology

## 2024-06-18 VITALS — BP 101/69 | HR 100 | Ht 68.0 in

## 2024-06-18 DIAGNOSIS — R040 Epistaxis: Secondary | ICD-10-CM

## 2024-06-18 DIAGNOSIS — F17210 Nicotine dependence, cigarettes, uncomplicated: Secondary | ICD-10-CM | POA: Diagnosis not present

## 2024-06-18 DIAGNOSIS — F17200 Nicotine dependence, unspecified, uncomplicated: Secondary | ICD-10-CM

## 2024-06-18 DIAGNOSIS — H608X3 Other otitis externa, bilateral: Secondary | ICD-10-CM

## 2024-06-18 MED ORDER — BETAMETHASONE DIPROPIONATE 0.05 % EX CREA: TOPICAL_CREAM | Freq: Two times a day (BID) | CUTANEOUS | 0 refills | Status: DC

## 2024-06-18 MED ORDER — FLUOCINOLONE ACETONIDE 0.01 % OT OIL
4.0000 [drp] | TOPICAL_OIL | Freq: Two times a day (BID) | OTIC | 5 refills | Status: AC
Start: 2024-06-18 — End: 2024-07-02

## 2024-06-18 NOTE — Patient Instructions (Signed)
 Use dermotic  ear drops 4 drops twice daily for 2 weeks Use betamethasone  ointment just to outside of the ear canal for 2 weeks  Instructions to help prevent future episodes of nose bleeds: - Reduce nasal manipulation - no nose picking, no tissues in the nose other than for dabbing.  - Use Afrin nasal spray for ACTIVE bleeding on the bleeding side and hold firm, continuous nasal pressure (on nostril - soft part of the nose, not the bone)  for 15 mins continuously for ACTIVE bleeding.    Ayr gel: Apply a pea sized amount up to 4 times daily just to inside of each nostril like above, then pinch your nose for 10 seconds. Use this consistently.

## 2024-06-18 NOTE — Progress Notes (Addendum)
 Dear Dr. Tobie, Here is my assessment for our mutual patient, Samantha Rosales. Thank you for allowing me the opportunity to care for your patient. Please do not hesitate to contact me should you have any other questions. Sincerely, Dr. Eldora Tobie  Otolaryngology Clinic Note  HISTORY: Samantha Rosales is a 43 y.o. female kindly referred by Dr. Tobie for evaluation of epistaxis and ear itching and fullness.   Initial visit (06/2024): Epistaxis history: occurred last time in May, but only one episode; unsure what side (possible b/l?); no antecedent event, unclear what brings it about but nose does feel dry; she reports the episode lasted about an hour, resolved with pressure; none since; Using flonase  - twice a day (every other day); CKD/Liver dysfunction: yes Anticoagulation/AP: no Trauma: no History of Sinusitis: no Nasal obstruction: no Nasal procedures: no Current nasal medication use: flonase   In addition, she reports that both of her ears itch and feel like they clog up. Ongoing, intermittent. She has not tried any otic drops or ointments. No HL. Does use q-tips Patient denies: ear pain, vertigo, drainage, tinnitus Patient also denies barotrauma, vestibular suppressant use, ototoxic medication use Prior ear surgery: no She has had a   AP/AC: no  Tobacco: current, smoke  PMHx: RA, Chronic Pancreatitis, Seizures, EtOH use, Possible Cirrhosis, HTN  RADIOGRAPHIC EVALUATION AND INDEPENDENT REVIEW OF OTHER RECORDS: Dr. JONELLE Tobie (04/10/2024): noted nasal congestion, PND, ear fullness, tried flonase  and PO anthistamine; noted bilateral nasal soreness and epistaxis; Dx: Recurrent epistaxis, sinusiits; Rx: flonase , azithromycin , ref to ENT Labs CBC and BMP 05/01/2024: WBC 7.3, Hgb 10.3, Plt 222; BUN/Cr 6/0.64; Alk phos 191/AST 58/ALT 19 CTH 07/13/2023 independently interpreted with respect to sinuses and ears: no significant paranasal sinus opacification, mastoids and ME clear; cuts thick so  suboptimal eval but no noted ossicular chain or otic capsule abnormalities Past Medical History:  Diagnosis Date   Abnormal Pap smear    Alcohol use    Allergic rhinitis    BV (bacterial vaginosis)    Closed left ankle fracture    GERD (gastroesophageal reflux disease)    Hepatitis    LFT's >1000 (2019) due to methotrexate vs alcohol   High cholesterol    HSV-2 (herpes simplex virus 2) infection    Hypertension    Hypothyroidism 10/17/2019   Pancreatitis    12/19 (thought to be due to alcohol)   Rheumatoid arthritis (HCC)    Seizures (HCC)    dx as child with epilepsy but no seizures since 89 and on no meds   Vaginal Pap smear, abnormal    Past Surgical History:  Procedure Laterality Date   arm fracture     CERVICAL CONIZATION W/BX  12/20/2012   Procedure: CONIZATION CERVIX WITH BIOPSY;  Surgeon: Vonn VEAR Inch, MD;  Location: [facility];  Service: Gynecology;  Laterality: N/A;   FRACTURE SURGERY Left 2024   left arm   FRACTURE SURGERY Left 2022   left fracture   HOLMIUM LASER APPLICATION  12/20/2012   Procedure: HOLMIUM LASER APPLICATION;  Surgeon: Vonn VEAR Inch, MD;  Location: [facility];  Service: Gynecology;  Laterality: N/A;   Family History  Problem Relation Age of Onset   Hypertension Mother    Hypertension Father    Pancreatic cancer Paternal Uncle    Colon cancer Neg Hx    Esophageal cancer Neg Hx    Inflammatory bowel disease Neg Hx    Liver disease Neg Hx    Rectal cancer Neg Hx  Stomach cancer Neg Hx    Social History   Tobacco Use   Smoking status: Every Day    Current packs/day: 0.50    Average packs/day: 0.5 packs/day for 15.0 years (7.5 ttl pk-yrs)    Types: Cigarettes   Smokeless tobacco: Never  Substance Use Topics   Alcohol use: Yes    Alcohol/week: 31.0 standard drinks of alcohol    Types: 3 Cans of beer, 28 Standard drinks or equivalent per week    Comment: 1 beer daily   Allergies  Allergen Reactions   Penicillins Hives, Rash and Other (See  Comments)   Current Outpatient Medications  Medication Sig Dispense Refill   acetaminophen  (TYLENOL ) 325 MG tablet Take 2 tablets (650 mg total) by mouth every 6 (six) hours as needed. 36 tablet 0   betamethasone  dipropionate 0.05 % cream Apply topically 2 (two) times daily for 14 days. To outside of each ear canal twice daily 30 g 0   CALCIUM  PO Take 1 tablet by mouth daily.     calcium -vitamin D  (OSCAL WITH D) 500-5 MG-MCG tablet Take 1 tablet by mouth daily with breakfast. 30 tablet 0   cholecalciferol (VITAMIN D3) 25 MCG (1000 UNIT) tablet Take 1 tablet (1,000 Units total) by mouth daily. 90 tablet 1   escitalopram  (LEXAPRO ) 10 MG tablet Take 1 tablet (10 mg total) by mouth daily. 90 tablet 3   Fluocinolone  Acetonide (DERMOTIC ) 0.01 % OIL Place 4 drops in ear(s) in the morning and at bedtime for 14 days. 20 mL 5   fluticasone  (FLONASE ) 50 MCG/ACT nasal spray PLACE 2 SPRAYS INTO BOTH NOSTRILS AS NEEDED FOR ALLERGIES OR RHINITIS. 16 mL 2   gabapentin  (NEURONTIN ) 300 MG capsule Take 1 capsule (300 mg total) by mouth daily as needed. 30 capsule 3   ibuprofen  (ADVIL ) 600 MG tablet Take 1 tablet (600 mg total) by mouth every 6 (six) hours as needed. Take medication with food to prevent upset stomach 30 tablet 1   iron  polysaccharides (NU-IRON ) 150 MG capsule Take 1 capsule (150 mg total) by mouth daily. 30 capsule 3   losartan  (COZAAR ) 25 MG tablet Take 1 tablet (25 mg total) by mouth daily. 90 tablet 1   MAGNESIUM  PO Take 1 tablet by mouth daily.     Multiple Vitamin (MULTIVITAMIN WITH MINERALS) TABS tablet Take 1 tablet by mouth daily.     norethindrone  (AYGESTIN ) 5 MG tablet Take by mouth daily.     Omega 3 1000 MG CAPS Take 1 capsule (1,000 mg total) by mouth 2 (two) times daily. 60 capsule 3   ondansetron  (ZOFRAN ) 4 MG tablet Take 1 tablet (4 mg total) by mouth every 8 (eight) hours as needed for nausea or vomiting. 20 tablet 1   traZODone  (DESYREL ) 50 MG tablet Take 1 tablet (50 mg total) by  mouth at bedtime. 30 tablet 2   triamcinolone (KENALOG) 0.1 % paste Use as directed 1 Application in the mouth or throat 3 (three) times daily.     furosemide  (LASIX ) 20 MG tablet Take 1 tablet (20 mg total) by mouth daily. 30 tablet 3   lipase/protease/amylase (CREON ) 36000 UNITS CPEP capsule Take 2 capsules (72,000 Units total) by mouth 3 times daily with meals and 1 capsule (36,000 Units total) by mouth with snacks up to 2 snacks per day. 240 capsule 5   spironolactone  (ALDACTONE ) 50 MG tablet Take 1 tablet (50 mg total) by mouth daily. 30 tablet 3   No current facility-administered medications for  this visit.   BP 101/69 (BP Location: Left Arm, Patient Position: Sitting, Cuff Size: Normal)   Pulse 100   Ht 5' 8 (1.727 m)   SpO2 98%   BMI 16.30 kg/m   PHYSICAL EXAM:  BP 101/69 (BP Location: Left Arm, Patient Position: Sitting, Cuff Size: Normal)   Pulse 100   Ht 5' 8 (1.727 m)   SpO2 98%   BMI 16.30 kg/m    Salient findings:  CN II-XII intact  Bilateral EAC mild cerumen and TM intact with well pneumatized middle ear spaces; modest eczematoid change b/l Weber 512:mid Rinne 512: AC > BC b/l  Nose: Anterior rhinoscopy reveals bilateral prominent septal vessels; no epistaxis.  Nasal endoscopy was indicated to better evaluate the nose and paranasal sinuses, given the patient's history and exam findings, and is detailed below. No lesions of oral cavity/oropharynx; no vascular masses oral cavity No obviously palpable neck masses/lymphadenopathy/thyromegaly No respiratory distress or stridor   PROCEDURE:  Prior to initiating any procedures, risks/benefits/alternatives were explained to the patient and verbal consent obtained. Diagnostic Nasal Endoscopy Pre-procedure diagnosis: Epistaxis Post-procedure diagnosis: same Indication: See pre-procedure diagnosis and physical exam above Complications: None apparent EBL: 0 mL Anesthesia: Lidocaine  4% and topical decongestant was  topically sprayed in each nasal cavity  Description of Procedure:  Patient was identified. A rigid 30 degree endoscope was utilized to evaluate the sinonasal cavities, mucosa, sinus ostia and turbinates and septum.  Overall, signs of mucosal inflammation are not noted.  Also noted are prominent b/l caudal septal vessels.  No mucopurulence, polyps, or masses noted.   Right Middle meatus: clear Right SE Recess: clear Left MM: clear Left SE Recess: clear  CPT CODE -- 31231 - Mod 25   ASSESSMENT:  43 y.o. with:  1. Epistaxis   2. Chronic eczematous otitis externa of both ears   3. Tobacco use disorder    Reports only one significant episode of epistaxis, none since. Most likely due to prominent septal vessels; d/w pt options: humidification v/s cautery; after R/B/A discussion, opted for humidification  Given the patient's tobacco use, I also discussed cessation and options for cessation, including counseling. Counseled patient on the dangers of tobacco use, advised patient to stop smoking, and reviewed strategies to maximize success. Total time spent with this was 4 minutes.    For itching, suspect eczematoid OE - will treat  - humidification with ayr gel QID PRN - avoid nasal manipulation  - start dermotic  4 drops BID x2 weeks; betamethasone  ear canal x2 weeks BID  - Follow-up if sx do not improve  See below regarding exact medications prescribed this encounter including dosages and route: Meds ordered this encounter  Medications   betamethasone  dipropionate 0.05 % cream    Sig: Apply topically 2 (two) times daily for 14 days. To outside of each ear canal twice daily    Dispense:  30 g    Refill:  0   Fluocinolone  Acetonide (DERMOTIC ) 0.01 % OIL    Sig: Place 4 drops in ear(s) in the morning and at bedtime for 14 days.    Dispense:  20 mL    Refill:  5     Thank you for allowing me the opportunity to care for your patient. Please do not hesitate to contact me should you  have any other questions.  Sincerely, Eldora Blanch, MD Otolaryngologist (ENT), [facility] Phone: [phone] Fax: [phone]  MDM:  Level 4: [phone] Complexity/Problems addressed: mod Data complexity: mod - independent  interpretation of CT imaging; review of notes, labs - Morbidity: mod  - Prescription Drug prescribed or managed: y  06/29/2024, 5:45 PM

## 2024-06-23 ENCOUNTER — Ambulatory Visit: Admitting: Obstetrics and Gynecology

## 2024-06-23 ENCOUNTER — Encounter: Payer: Self-pay | Admitting: *Deleted

## 2024-06-24 ENCOUNTER — Telehealth: Payer: Self-pay | Admitting: *Deleted

## 2024-06-24 ENCOUNTER — Encounter: Payer: Self-pay | Admitting: *Deleted

## 2024-06-24 ENCOUNTER — Other Ambulatory Visit: Payer: Self-pay | Admitting: Gastroenterology

## 2024-06-24 DIAGNOSIS — K861 Other chronic pancreatitis: Secondary | ICD-10-CM

## 2024-06-24 MED ORDER — PANCRELIPASE (LIP-PROT-AMYL) 36000-114000 UNITS PO CPEP: ORAL_CAPSULE | ORAL | 5 refills | Status: AC

## 2024-06-24 NOTE — Telephone Encounter (Signed)
 error

## 2024-06-24 NOTE — Telephone Encounter (Signed)
 Pt came in the office stated that she was bloated and her stomach was hurting and hard. I informed her that provider was at hospital and we made OV for 7/23. Informed that if she got worse over night to go to ED. Pt voiced understanding.

## 2024-06-24 NOTE — Progress Notes (Unsigned)
 Referring Provider: Del Orbe Polanco, Ilian* Primary Care Physician:  Terry Wilhelmena Lloyd Hilario, FNP Primary GI Physician: Dr. Shaaron  Chief Complaint  Patient presents with   Abdominal Pain    Stomach swollen and lots of pain.      HPI:   Samantha Rosales is a 43 y.o. female with history of chronic alcohol use, elevated LFTs, HTN, RA, seizures, anemia, chronic alcoholic pancreatitis, pancreatic duct dilation and pancreatic duct stone, fatty liver vs cirrhosis considering left upper quadrant collateral vessels suggesting sequela of portal hypertension on CT in May 2025, non-specific colitis in May 2025, presenting today with chief complaint of abdominal bloating.   Last seen in the office 05/01/2024 for follow-up of colitis, acute on chronic anemia, elevated LFTs/concern for cirrhosis, alcohol induced chronic pancreatitis, hypocalcemia/hypokalemia.  Clinically, she was feeling improved from colitis standpoint though continued with multiple soft stools per day.  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  Labs and stool testing were ordered, and recommended pursuing EGD and colonoscopy.  Discussion was also had with Dr. Wilhelmenia regarding chronic pancreatitis, pancreatic duct dilation and pancreatic stone.  Recommended outpatient EUS to evaluate pancreatic duct dilation to ensure no other underlying etiology aside from pancreatic duct stone.  Labs completed showing improved hemoglobin to 10.3, AST 58, alk phos 191, ALT and bilirubin within normal limits.  IgG mildly elevated at 1681, ASMA within normal limits.  Alpha-1 antitrypsin within normal limits, ceruloplasmin within normal limits.  Immune to hepatitis A.  No immunity to hep B.  INR within normal limits.  Stool testing was never performed as patient stated she couldn't due this due to gagging.   As diarrhea was much improved, advised that it was okay that we do not perform stool testing.  Recommended  starting Creon  for suspected EPI in the setting of chronic pancreatitis.  Patient stated she remembered being on Creon  in the past and it did help her.  EGD and colonoscopy have yet to be scheduled due to issues getting in contact with the patient.    Today:  1 week of abdominal distension. Not painful, but uncomfortable due to distension. Feels like she needs to pass gas or burp due to pressure. Thought she was getting constipated because she was having incomplete bowel movements. Took MOM yesterday and had 3 BMs back to back and 1 BM this morning, but no change in abdominal distension.    She does use a saltshaker, eat canned foods, frozen preprepared meals, and deli meats.  No brbpr, melena, nausea, vomiting.   No mental status changes, LE edema.    Drinking 1 beer daily. Cut back from 2-3 per day. States she can stop completely.      Past Medical History:  Diagnosis Date   Abnormal Pap smear    Alcohol use    Allergic rhinitis    BV (bacterial vaginosis)    Closed left ankle fracture    GERD (gastroesophageal reflux disease)    Hepatitis    LFT's >1000 (2019) due to methotrexate vs alcohol   High cholesterol    HSV-2 (herpes simplex virus 2) infection    Hypertension    Hypothyroidism 10/17/2019   Pancreatitis    12/19 (thought to be due to alcohol)   Rheumatoid arthritis (HCC)    Seizures (HCC)    dx as child with epilepsy but no seizures since 89 and on no meds   Vaginal Pap smear, abnormal     Past Surgical  History:  Procedure Laterality Date   arm fracture     CERVICAL CONIZATION W/BX  12/20/2012   Procedure: CONIZATION CERVIX WITH BIOPSY;  Surgeon: Vonn VEAR Inch, MD;  Location: [facility];  Service: Gynecology;  Laterality: N/A;   FRACTURE SURGERY Left 2024   left arm   FRACTURE SURGERY Left 2022   left fracture   HOLMIUM LASER APPLICATION  12/20/2012   Procedure: HOLMIUM LASER APPLICATION;  Surgeon: Vonn VEAR Inch, MD;  Location: [facility];  Service: Gynecology;   Laterality: N/A;    Current Outpatient Medications  Medication Sig Dispense Refill   acetaminophen  (TYLENOL ) 325 MG tablet Take 2 tablets (650 mg total) by mouth every 6 (six) hours as needed. 36 tablet 0   betamethasone  dipropionate 0.05 % cream Apply topically 2 (two) times daily for 14 days. To outside of each ear canal twice daily 30 g 0   CALCIUM  PO Take 1 tablet by mouth daily.     calcium -vitamin D  (OSCAL WITH D) 500-5 MG-MCG tablet Take 1 tablet by mouth daily with breakfast. 30 tablet 0   cholecalciferol (VITAMIN D3) 25 MCG (1000 UNIT) tablet Take 1 tablet (1,000 Units total) by mouth daily. 90 tablet 1   escitalopram  (LEXAPRO ) 10 MG tablet Take 1 tablet (10 mg total) by mouth daily. 90 tablet 3   Fluocinolone  Acetonide (DERMOTIC ) 0.01 % OIL Place 4 drops in ear(s) in the morning and at bedtime for 14 days. 20 mL 5   fluticasone  (FLONASE ) 50 MCG/ACT nasal spray PLACE 2 SPRAYS INTO BOTH NOSTRILS AS NEEDED FOR ALLERGIES OR RHINITIS. 16 mL 2   gabapentin  (NEURONTIN ) 300 MG capsule Take 1 capsule (300 mg total) by mouth daily as needed. 30 capsule 3   ibuprofen  (ADVIL ) 600 MG tablet Take 1 tablet (600 mg total) by mouth every 6 (six) hours as needed. Take medication with food to prevent upset stomach 30 tablet 1   iron  polysaccharides (NU-IRON ) 150 MG capsule Take 1 capsule (150 mg total) by mouth daily. 30 capsule 3   lipase/protease/amylase (CREON ) 36000 UNITS CPEP capsule Take 2 capsules (72,000 Units total) by mouth 3 times daily with meals and 1 capsule (36,000 Units total) by mouth with snacks up to 2 snacks per day. 240 capsule 5   losartan  (COZAAR ) 25 MG tablet Take 1 tablet (25 mg total) by mouth daily. 90 tablet 1   MAGNESIUM  PO Take 1 tablet by mouth daily.     Multiple Vitamin (MULTIVITAMIN WITH MINERALS) TABS tablet Take 1 tablet by mouth daily.     norethindrone  (AYGESTIN ) 5 MG tablet Take by mouth daily.     Omega 3 1000 MG CAPS Take 1 capsule (1,000 mg total) by mouth 2  (two) times daily. 60 capsule 3   ondansetron  (ZOFRAN ) 4 MG tablet Take 1 tablet (4 mg total) by mouth every 8 (eight) hours as needed for nausea or vomiting. 20 tablet 1   potassium chloride  (KLOR-CON  M) 10 MEQ tablet TAKE 1 TABLET BY MOUTH EVERY DAY 90 tablet 1   potassium chloride  SA (KLOR-CON  M) 20 MEQ tablet Take 1 tablet (20 mEq total) by mouth 2 (two) times daily for 7 days. 14 tablet 0   traZODone  (DESYREL ) 50 MG tablet Take 1 tablet (50 mg total) by mouth at bedtime. 30 tablet 2   triamcinolone (KENALOG) 0.1 % paste Use as directed 1 Application in the mouth or throat 3 (three) times daily.     No current facility-administered medications for this visit.  Allergies as of 06/25/2024 - Review Complete 06/25/2024  Allergen Reaction Noted   Penicillins Hives, Rash, and Other (See Comments) 12/13/2012    Family History  Problem Relation Age of Onset   Hypertension Mother    Hypertension Father    Pancreatic cancer Paternal Uncle    Colon cancer Neg Hx    Esophageal cancer Neg Hx    Inflammatory bowel disease Neg Hx    Liver disease Neg Hx    Rectal cancer Neg Hx    Stomach cancer Neg Hx     Social History   Socioeconomic History   Marital status: Married    Spouse name: Not on file   Number of children: 2   Years of education: Not on file   Highest education level: Associate degree: occupational, Scientist, product/process development, or vocational program  Occupational History   Occupation: [employer]    Employer: [employer]   Tobacco Use   Smoking status: Every Day    Current packs/day: 0.50    Average packs/day: 0.5 packs/day for 15.0 years (7.5 ttl pk-yrs)    Types: Cigarettes   Smokeless tobacco: Never  Vaping Use   Vaping status: Never Used  Substance and Sexual Activity   Alcohol use: Yes    Alcohol/week: 31.0 standard drinks of alcohol    Types: 3 Cans of beer, 28 Standard drinks or equivalent per week    Comment: 1 beer daily   Drug use: No   Sexual  activity: Not Currently    Birth control/protection: Condom  Other Topics Concern   Not on file  Social History Narrative   Not on file   Social Drivers of Health   Financial Resource Strain: Medium Risk (01/09/2024)   Overall Financial Resource Strain (CARDIA)    Difficulty of Paying Living Expenses: Somewhat hard  Food Insecurity: No Food Insecurity (01/09/2024)   Hunger Vital Sign    Worried About Running Out of Food in the Last Year: Never true    Ran Out of Food in the Last Year: Never true  Transportation Needs: No Transportation Needs (01/09/2024)   PRAPARE - Administrator, Civil Service (Medical): No    Lack of Transportation (Non-Medical): No  Physical Activity: Inactive (01/09/2024)   Exercise Vital Sign    Days of Exercise per Week: 0 days    Minutes of Exercise per Session: 0 min  Stress: Stress Concern Present (01/09/2024)   [employer] of Occupational Health - Occupational Stress Questionnaire    Feeling of Stress : To some extent  Social Connections: Moderately Isolated (01/09/2024)   Social Connection and Isolation Panel    Frequency of Communication with Friends and Family: More than three times a week    Frequency of Social Gatherings with Friends and Family: Twice a week    Attends Religious Services: Never    Database administrator or Organizations: No    Attends Engineer, structural: Never    Marital Status: Married    Review of Systems: Gen: Denies fever, chills, cold or flulike symptoms, presyncope, syncope. CV: Denies chest pain, palpitations. Resp: Denies dyspnea, cough. GI: See HPI Heme: See HPI  Physical Exam: BP 119/77 (BP Location: Right Arm, Patient Position: Sitting, Cuff Size: Small)   Pulse 97   Temp 98.1 F (36.7 C) (Temporal)   Ht 5' 8 (1.727 m)   Wt 111 lb 3.2 oz (50.4 kg)   BMI 16.91 kg/m  General:  Alert and oriented. No distress noted.  Pleasant and cooperative.  Head:  Normocephalic and atraumatic. Eyes:   Conjuctiva clear without scleral icterus. Heart:  S1, S2 present without murmurs appreciated. Lungs:  Clear to auscultation bilaterally. No wheezes, rales, or rhonchi. No distress.  Abdomen:  +BS, distended but soft and nontender to palpation. +shifting dullness on exam. No rebound or guarding. Firm liver border palpated below right costal margin.  Msk:  Symmetrical without gross deformities. Normal posture. Extremities:  Without edema. Neurologic:  Alert and  oriented x4 Psych:  Normal mood and affect.    Assessment:  43 y.o. female with history of chronic alcohol use, elevated LFTs, HTN, RA, seizures, anemia, chronic alcoholic pancreatitis, pancreatic duct dilation and pancreatic duct stone, fatty liver vs cirrhosis considering left upper quadrant collateral vessels suggesting sequela of portal hypertension on CT in May 2025, non-specific colitis in May 2025, presenting today with chief complaint of abdominal bloating.   Abdominal bloating:  Present x 1 week. Suspect we are dealing with ascites in the setting of cirrhosis. She has no real abdominal pain, so doubt, SBP. Bowels are moving and bowels sounds are normal on exam today, so no concern of developing obstruction. Denies nausea or vomiting.   Plan for an ultrasound to confirm ascites today followed by paracentesis today if possible.  She is not currently on diuretics, but will plan to start these after paracentesis.  She is consuming a diet that is high in salt and she was counseled extensively on this today.   ETOH Cirrhosis: History of hepatic steatosis and more recently with CT in May 2025 suspicious for cirrhosis.  She had been well compensated, but now with likely ascites as per above in the setting of abdominal distention.  Doubt SBP as she has no real abdominal pain.  Otherwise, she continues to do well.  Prior extensive serologic workup has been negative for other etiologies aside from alcohol.  She has been working on decreasing  her alcohol consumption, down to 1 drink per day and states that she can stop this completely.  We had a thorough discussion today regarding the importance of alcohol cessation in efforts to prevent further progression of cirrhosis and for her candidacy for liver transplant should this become a need in the future.   Labs in May 2025 with MELD 3.0 of 7.  Previously recommended EGD.  Scheduling is still pending.   Constipation:  Mild constipation with incomplete bowel movements. No brbpr or melena. Recommended MiraLAX  daily.    Plan:  Ultrasound ascites.  If ascites is confirmed, will plan for paracentesis today if possible. Pending confirmation of ascites, will start Lasix  20 mg and spironolactone  50 mg daily with repeat BMP in 1 week. 2 g/day sodium restriction. Start MiraLAX  17 g daily Counseled on importance of alcohol cessation.  Follow-up in 4 weeks.   Josette Centers, PA-C [facility] 06/25/2024

## 2024-06-25 ENCOUNTER — Other Ambulatory Visit: Payer: Self-pay | Admitting: *Deleted

## 2024-06-25 ENCOUNTER — Encounter: Payer: Self-pay | Admitting: Gastroenterology

## 2024-06-25 ENCOUNTER — Ambulatory Visit (INDEPENDENT_AMBULATORY_CARE_PROVIDER_SITE_OTHER): Admitting: Gastroenterology

## 2024-06-25 ENCOUNTER — Ambulatory Visit: Payer: Self-pay | Admitting: Gastroenterology

## 2024-06-25 ENCOUNTER — Ambulatory Visit (HOSPITAL_COMMUNITY)
Admission: RE | Admit: 2024-06-25 | Discharge: 2024-06-25 | Disposition: A | Discharge status: Home / Self Care | Source: Ambulatory Visit | Attending: Gastroenterology | Admitting: Gastroenterology

## 2024-06-25 ENCOUNTER — Encounter (HOSPITAL_COMMUNITY): Payer: Self-pay

## 2024-06-25 VITALS — BP 119/77 | HR 97 | Temp 98.1°F | Ht 68.0 in | Wt 111.2 lb

## 2024-06-25 DIAGNOSIS — K760 Fatty (change of) liver, not elsewhere classified: Secondary | ICD-10-CM | POA: Diagnosis not present

## 2024-06-25 DIAGNOSIS — R140 Abdominal distension (gaseous): Secondary | ICD-10-CM

## 2024-06-25 DIAGNOSIS — K7030 Alcoholic cirrhosis of liver without ascites: Secondary | ICD-10-CM

## 2024-06-25 DIAGNOSIS — R188 Other ascites: Secondary | ICD-10-CM | POA: Diagnosis not present

## 2024-06-25 DIAGNOSIS — F1090 Alcohol use, unspecified, uncomplicated: Secondary | ICD-10-CM | POA: Diagnosis not present

## 2024-06-25 DIAGNOSIS — K5900 Constipation, unspecified: Secondary | ICD-10-CM

## 2024-06-25 DIAGNOSIS — K7031 Alcoholic cirrhosis of liver with ascites: Secondary | ICD-10-CM

## 2024-06-25 LAB — GRAM STAIN

## 2024-06-25 LAB — BODY FLUID CELL COUNT WITH DIFFERENTIAL
Eos, Fluid: 0 %
Lymphs, Fluid: 16 %
Monocyte-Macrophage-Serous Fluid: 81 % (ref 50–90)
Neutrophil Count, Fluid: 3 % (ref 0–25)
Total Nucleated Cell Count, Fluid: 259 uL (ref 0–1000)

## 2024-06-25 LAB — ALBUMIN, PLEURAL OR PERITONEAL FLUID: Albumin, Fluid: <1.5 g/dL

## 2024-06-25 LAB — PROTEIN, PLEURAL OR PERITONEAL FLUID: Total protein, fluid: <3 g/dL

## 2024-06-25 MED ORDER — SPIRONOLACTONE 50 MG PO TABS: 50.0000 mg | ORAL_TABLET | Freq: Every day | ORAL | 3 refills | Status: DC

## 2024-06-25 MED ORDER — FUROSEMIDE 20 MG PO TABS
20.0000 mg | ORAL_TABLET | Freq: Every day | ORAL | 3 refills | Status: DC
Start: 2024-06-25 — End: 2024-07-28

## 2024-06-25 NOTE — Patient Instructions (Signed)
 We will arrange for you to have an ultrasound of your abdomen to evaluate for ascites (free fluid in your abdomen).  If ascites is confirmed, we will plan for paracentesis to draw the fluid off of your abdomen.  I am hopeful that we can get this scheduled today as well.  After I have the results of these tests, I will plan to start you on Lasix  and spironolactone  to prevent reaccumulation of fluid in your abdomen.  As we discussed, it is very important that you follow a strict low-sodium diet.  No more than 2000 mg of sodium per day. You will need to avoid using a saltshaker, canned foods, processed foods, deli meat, red meat, pork.   To help with constipation, start taking MiraLAX  1 capful (17 g) daily in 8 ounces of water  or other noncarbonated beverage of your choice.  I will plan to see back in 4 weeks or sooner if needed.  Josette Centers, PA-C [facility]

## 2024-06-25 NOTE — Progress Notes (Signed)
 Patient tolerated right sided Paracentesis procedure well today and 950 mL or clear yellow ascites removed and taken to lab for processing. Patient verbalized understanding of discharge instructions and ambulatory at departure with no acute distress noted.

## 2024-06-25 NOTE — Procedures (Signed)
 PROCEDURE SUMMARY:  Successful ultrasound guided paracentesis from the left lower quadrant.  Yielded 950 ml of clear yellow fluid.  No immediate complications.  The patient tolerated the procedure well.   Specimen sent for labs.  EBL < 2 mL  If the patient eventually requires >/=2 paracenteses in a 30 day period, screening evaluation by the [facility] will be assessed.  Warren Dais, AGACNP-BC 06/25/2024, 1:33 PM

## 2024-06-27 LAB — CYTOLOGY - NON PAP

## 2024-06-30 LAB — CULTURE, BODY FLUID W GRAM STAIN -BOTTLE
Culture: NO GROWTH
Special Requests: ADEQUATE

## 2024-07-04 ENCOUNTER — Ambulatory Visit: Payer: Self-pay | Admitting: Gastroenterology

## 2024-07-04 DIAGNOSIS — E876 Hypokalemia: Secondary | ICD-10-CM

## 2024-07-04 LAB — BASIC METABOLIC PANEL WITH GFR
BUN/Creatinine Ratio: 5 (calc) — ABNORMAL LOW (ref 6–22)
BUN: 4 mg/dL — ABNORMAL LOW (ref 7–25)
CO2: 28 mmol/L (ref 20–32)
Calcium: 8.3 mg/dL — ABNORMAL LOW (ref 8.6–10.2)
Chloride: 94 mmol/L — ABNORMAL LOW (ref 98–110)
Creat: 0.81 mg/dL (ref 0.50–0.99)
Glucose, Bld: 202 mg/dL — ABNORMAL HIGH (ref 65–139)
Potassium: 2.5 mmol/L — CL (ref 3.5–5.3)
Sodium: 133 mmol/L — ABNORMAL LOW (ref 135–146)
eGFR: 92 mL/min/1.73m2 (ref >=60)

## 2024-07-08 ENCOUNTER — Other Ambulatory Visit: Payer: Self-pay | Admitting: Family Medicine

## 2024-07-08 ENCOUNTER — Telehealth: Payer: Self-pay

## 2024-07-08 DIAGNOSIS — E876 Hypokalemia: Secondary | ICD-10-CM

## 2024-07-08 NOTE — Telephone Encounter (Signed)
 Please come in for lab work to check your potassium levels. Has patient been taking your potassium tablets as prescribed? Patient can walk in for labs between 8:00 AM - 12:00 PM or 1:00 PM - 4:00 PM

## 2024-07-08 NOTE — Telephone Encounter (Signed)
 Copied from CRM [id number]. Topic: Clinical - Medical Advice >> Jul 08, 2024 10:52 AM Vena H wrote: Reason for CRM: Pt's states she seen her eye doctor and he stated her copper  is low and that she needed to contact her pcp. Please reach out to pt

## 2024-07-09 NOTE — Telephone Encounter (Signed)
 Mychart message sent.

## 2024-07-11 DIAGNOSIS — E876 Hypokalemia: Secondary | ICD-10-CM | POA: Diagnosis not present

## 2024-07-12 LAB — BASIC METABOLIC PANEL WITH GFR
BUN: 10 mg/dL (ref 7–25)
CO2: 28 mmol/L (ref 20–32)
Calcium: 8.6 mg/dL (ref 8.6–10.2)
Chloride: 107 mmol/L (ref 98–110)
Creat: 0.59 mg/dL (ref 0.50–0.99)
Glucose, Bld: 104 mg/dL — ABNORMAL HIGH (ref 65–99)
Potassium: 3.9 mmol/L (ref 3.5–5.3)
Sodium: 141 mmol/L (ref 135–146)
eGFR: 115 mL/min/1.73m2 (ref >=60)

## 2024-07-16 ENCOUNTER — Encounter: Payer: Self-pay | Admitting: Advanced Practice Midwife

## 2024-07-16 ENCOUNTER — Ambulatory Visit: Admitting: Advanced Practice Midwife

## 2024-07-16 ENCOUNTER — Other Ambulatory Visit (HOSPITAL_COMMUNITY)
Admission: RE | Admit: 2024-07-16 | Discharge: 2024-07-16 | Disposition: A | Discharge status: Home / Self Care | Source: Ambulatory Visit | Attending: Advanced Practice Midwife | Admitting: Advanced Practice Midwife

## 2024-07-16 VITALS — BP 122/76 | HR 92 | Ht 68.0 in | Wt 109.0 lb

## 2024-07-16 DIAGNOSIS — Z01419 Encounter for gynecological examination (general) (routine) without abnormal findings: Secondary | ICD-10-CM

## 2024-07-16 DIAGNOSIS — Z1231 Encounter for screening mammogram for malignant neoplasm of breast: Secondary | ICD-10-CM

## 2024-07-16 DIAGNOSIS — Z1151 Encounter for screening for human papillomavirus (HPV): Secondary | ICD-10-CM | POA: Diagnosis not present

## 2024-07-16 NOTE — Patient Instructions (Signed)
 Call [facility] to get your mammogram scheduled for October. The number is [phone].

## 2024-07-16 NOTE — Progress Notes (Signed)
 WELL-WOMAN EXAMINATION Patient name: Samantha Rosales MRN [id number]  Date of birth: 1981-04-26 Chief Complaint:   Gynecologic Exam  History of Present Illness:   KALIN AMRHEIN is a 43 y.o. [phone] African-American female being seen today for a routine well-woman exam.  Current complaints: had some spotting post cervical dilation 2/2 stenosis in Jan 2025, but no bleeding since; she reports R breast larger than L, which is a new finding; she has also been worked up for unexplained weight loss (was 122lb at her visit Oct 2024)  PCP: Terry Wilhelmena Falter FNP      does not desire labs No LMP recorded. The current method of family planning is post menopausal status.  Last pap July 2024. Results were: LSIL w/ HRHPV negative. H/O abnormal pap: yes Last mammogram: Oct 2024. Results were: normal. Family h/o breast cancer: no Last colonoscopy: never (needs to schedule per GI doc). Results were: N/A. Family h/o colorectal cancer: no     07/16/2024    3:35 PM 04/10/2024    1:06 PM 10/26/2023    9:08 AM 06/29/2023    8:20 AM 06/29/2023    8:19 AM  Depression screen PHQ 2/9  Decreased Interest 0 0 0 0 0  Down, Depressed, Hopeless 0 0 1 0 0  PHQ - 2 Score 0 0 1 0 0  Altered sleeping 0 0 1 0 0  Tired, decreased energy 0 0 1 0 0  Change in appetite 0 0 1 0 0  Feeling bad or failure about yourself  0 0 1 0 0  Trouble concentrating 0 0 0 0 0  Moving slowly or fidgety/restless 0 0 0 0 0  Suicidal thoughts 0 0 0 0 0  PHQ-9 Score 0 0 5 0 0  Difficult doing work/chores  Not difficult at all Somewhat difficult Not difficult at all Somewhat difficult        07/16/2024    3:35 PM 04/10/2024    1:06 PM 10/26/2023    9:08 AM 06/12/2023   11:45 AM  GAD 7 : Generalized Anxiety Score  Nervous, Anxious, on Edge 0 0 1 1  Control/stop worrying 0 0 1 1  Worry too much - different things 0 0 1 1  Trouble relaxing 0 0 1 1  Restless 0  0 0  Easily annoyed or irritable 0 0 1 1  Afraid - awful might happen 0 0 0 0   Total GAD 7 Score 0  5 5  Anxiety Difficulty  Not difficult at all Somewhat difficult      Review of Systems:   Pertinent items are noted in HPI Denies any headaches, blurred vision, fatigue, shortness of breath, chest pain, abdominal pain, abnormal vaginal discharge/itching/odor/irritation, problems with periods, bowel movements, urination, or intercourse unless otherwise stated above. Pertinent History Reviewed:  Reviewed past medical,surgical, social and family history.  Reviewed problem list, medications and allergies. Physical Assessment:   Vitals:   07/16/24 1520  BP: 122/76  Pulse: 92  Weight: 109 lb (49.4 kg)  Height: 5' 8 (1.727 m)  Body mass index is 16.57 kg/m.        Physical Examination:   General appearance - cachetic- appearing, and in no distress  Mental status - alert, oriented to person, place, and time  Psych:  She has a normal mood and affect  Skin - warm and dry, normal color, no suspicious lesions noted  Chest - effort normal, all lung fields with intermittent wheezing bilaterally  Heart -  normal rate and regular rhythm  Neck:  midline trachea, no thyromegaly or nodules  Breasts - breasts appear normal, no suspicious masses, no skin or nipple changes or  axillary nodes; visually I couldn't appreciate the size difference she reports (R>L); no masses palp  Abdomen - soft, nontender, distended, no masses or organomegaly per palpation  Pelvic - VULVA: normal appearing vulva with no masses, tenderness or lesions  VAGINA: normal appearing vagina with normal color and discharge, no lesions  CERVIX: normal appearing cervix without discharge or lesions, no CMT  Thin prep pap is done with HR HPV cotesting  UTERUS: uterus is felt to be normal size, shape, consistency and nontender   ADNEXA: No adnexal masses or tenderness noted.  Rectal - not examined  Extremities:  No swelling or varicosities noted  Chaperone: Alan Fischer  No results found for this or any  previous visit (from the past 24 hours).  Assessment & Plan:  1) Well-Woman Exam  2) Abnl Pap, LSIL, -HRHPV in July 2024  3) Postmenopausal  4) Unexplained weight loss, followed by PCP/GI  5) cHTN, managed per PCP  Labs/procedures today: Pap  Mammogram: schedule now for Oct 2025 (order placed and given [facility] mammo #), or sooner if problems Colonoscopy: @ 45yo, or sooner if problems  Orders Placed This Encounter  Procedures   MM 3D SCREENING MAMMOGRAM BILATERAL BREAST    Meds: No orders of the defined types were placed in this encounter.   Follow-up: Return in about 1 year (around 07/16/2025) for Physical.  Suzen JONETTA Gentry [facility] 07/16/2024 4:26 PM

## 2024-07-17 ENCOUNTER — Other Ambulatory Visit: Payer: Self-pay | Admitting: Family Medicine

## 2024-07-17 ENCOUNTER — Telehealth: Payer: Self-pay | Admitting: Family Medicine

## 2024-07-17 MED ORDER — HYDROXYZINE PAMOATE 25 MG PO CAPS: 25.0000 mg | ORAL_CAPSULE | Freq: Three times a day (TID) | ORAL | 2 refills | Status: DC | PRN

## 2024-07-17 NOTE — Telephone Encounter (Unsigned)
 Copied from CRM #[id number]. Topic: Clinical - Medication Refill >> Jul 17, 2024  8:36 AM Tinnie C wrote: Medication: Hydroxyzine  to assist with some itching, previously prescribed by Hilario  Has the patient contacted their pharmacy? No (Agent: If no, request that the patient contact the pharmacy for the refill. If patient does not wish to contact the pharmacy document the reason why and proceed with request.) (Agent: If yes, when and what did the pharmacy advise?)  This is the patient's preferred pharmacy:  [id number] [place], [place] - [address] [place] [address] Phone: [phone] Fax: [phone]  Is this the correct pharmacy for this prescription? Yes If no, delete pharmacy and type the correct one.   Has the prescription been filled recently? No  Is the patient out of the medication? Yes  Has the patient been seen for an appointment in the last year OR does the patient have an upcoming appointment? Yes  Can we respond through MyChart? No  Agent: Please be advised that Rx refills may take up to 3 business days. We ask that you follow-up with your pharmacy.

## 2024-07-17 NOTE — Telephone Encounter (Signed)
 Copied from CRM #[id number]. Topic: Appointments - Scheduling Inquiry for Clinic >> Jul 17, 2024  8:46 AM Samantha Rosales wrote: Reason for CRM: Pt is wondering if she is due for a pneumonia shot. Please let her know at [phone]

## 2024-07-17 NOTE — Telephone Encounter (Signed)
 sent

## 2024-07-20 LAB — CYTOLOGY - PAP
Adequacy: ABSENT
Chlamydia: NEGATIVE
Comment: NEGATIVE
Comment: NEGATIVE
Comment: NORMAL
High risk HPV: NEGATIVE
Neisseria Gonorrhea: NEGATIVE

## 2024-07-23 ENCOUNTER — Ambulatory Visit: Payer: Self-pay | Admitting: Advanced Practice Midwife

## 2024-07-27 ENCOUNTER — Other Ambulatory Visit: Payer: Self-pay | Admitting: Family Medicine

## 2024-07-27 ENCOUNTER — Other Ambulatory Visit: Payer: Self-pay | Admitting: Gastroenterology

## 2024-07-27 DIAGNOSIS — K7031 Alcoholic cirrhosis of liver with ascites: Secondary | ICD-10-CM

## 2024-07-27 DIAGNOSIS — I10 Essential (primary) hypertension: Secondary | ICD-10-CM

## 2024-08-03 ENCOUNTER — Other Ambulatory Visit: Payer: Self-pay | Admitting: Family Medicine

## 2024-08-06 ENCOUNTER — Ambulatory Visit: Payer: Medicare Other

## 2024-08-13 ENCOUNTER — Encounter: Admitting: Women's Health

## 2024-08-14 ENCOUNTER — Encounter: Admitting: Women's Health

## 2024-08-19 ENCOUNTER — Ambulatory Visit

## 2024-08-22 ENCOUNTER — Ambulatory Visit: Admitting: Gastroenterology

## 2024-08-25 ENCOUNTER — Telehealth (HOSPITAL_COMMUNITY): Payer: Self-pay | Admitting: *Deleted

## 2024-08-25 NOTE — Telephone Encounter (Signed)
 Thank you for letting us  know Samantha Rosales.  Patty, You may offer this patient upper EUS as able/available in October/November/December based on availability at this time. Thanks. GM  FYI RR and KSH on patient canceling last-minute her EUS.

## 2024-08-25 NOTE — Telephone Encounter (Signed)
 Samantha Rosales was scheduled for 08/26/24 (Procedure) with Dr. Wilhelmenia  on 08/26/2024 (date), at [facility].   Patient/or family called on 08/25/2024 (date) to cancel their procedure due to states has a sore throat and was wanting to cancel for tomorrow. (reason)  Dr Wilhelmenia  & office notified. Patient instructed to call physician's office to reschedule their procedure. Patient demonstrated understanding.

## 2024-08-26 ENCOUNTER — Ambulatory Visit (HOSPITAL_COMMUNITY): Admission: RE | Admit: 2024-08-26 | Source: Home / Self Care | Admitting: Gastroenterology

## 2024-08-26 ENCOUNTER — Encounter (HOSPITAL_COMMUNITY): Admission: RE | Payer: Self-pay | Source: Home / Self Care

## 2024-08-26 ENCOUNTER — Telehealth: Payer: Self-pay | Admitting: Gastroenterology

## 2024-08-26 SURGERY — ULTRASOUND, UPPER GI TRACT, ENDOSCOPIC
Anesthesia: Monitor Anesthesia Care

## 2024-08-26 NOTE — Telephone Encounter (Signed)
 PT stated she was returning nurse's call. Please advise.

## 2024-08-26 NOTE — Telephone Encounter (Signed)
 The patient has been notified of this information and all questions answered.  Instructions sent to My Chart and home address

## 2024-08-26 NOTE — Telephone Encounter (Signed)
 See alternate note

## 2024-08-26 NOTE — Telephone Encounter (Signed)
 Case has been moved to 11/10 at 120 pm

## 2024-08-26 NOTE — Telephone Encounter (Signed)
 Left message on machine to call back

## 2024-09-15 ENCOUNTER — Ambulatory Visit (HOSPITAL_COMMUNITY)
Admission: RE | Admit: 2024-09-15 | Discharge: 2024-09-15 | Disposition: A | Discharge status: Home / Self Care | Source: Ambulatory Visit | Attending: Advanced Practice Midwife | Admitting: Advanced Practice Midwife

## 2024-09-15 ENCOUNTER — Ambulatory Visit (INDEPENDENT_AMBULATORY_CARE_PROVIDER_SITE_OTHER)

## 2024-09-15 VITALS — BP 125/80 | HR 90 | Resp 12 | Ht 68.5 in | Wt 109.0 lb

## 2024-09-15 DIAGNOSIS — Z1231 Encounter for screening mammogram for malignant neoplasm of breast: Secondary | ICD-10-CM | POA: Insufficient documentation

## 2024-09-15 DIAGNOSIS — Z0000 Encounter for general adult medical examination without abnormal findings: Secondary | ICD-10-CM

## 2024-09-15 DIAGNOSIS — Z23 Encounter for immunization: Secondary | ICD-10-CM

## 2024-09-15 NOTE — Patient Instructions (Addendum)
 Samantha Rosales,  Thank you for taking the time for your Medicare Wellness Visit. I appreciate your continued commitment to your health goals. Please review the care plan we discussed, and feel free to reach out if I can assist you further.  Medicare recommends these wellness visits once per year to help you and your care team stay ahead of potential health issues. These visits are designed to focus on prevention, allowing your provider to concentrate on managing your acute and chronic conditions during your regular appointments.  Please note that Annual Wellness Visits do not include a physical exam. Some assessments may be limited, especially if the visit was conducted virtually. If needed, we may recommend a separate in-person follow-up with your provider.  You received your flu vaccine today. Soreness, redness, and swelling where the shot is given, fever, muscle aches, and headache can happen after influenza vaccination.   If you have any questions or concerns, please reach out to our office.   Wishing you excellent health and many blessings in the year to come!  -Parneet Glantz, CMA  Ongoing Care Seeing your primary care provider every 3 to 6 months helps us  monitor your health and provide consistent, personalized care.   Recommended Screenings:  Health Maintenance  Topic Date Due   Hepatitis B Vaccine (1 of 3 - 19+ 3-dose series) Never done   HPV Vaccine (1 - 3-dose SCDM series) Never done   COVID-19 Vaccine (3 - Pfizer risk series) 05/17/2020   Flu Shot  07/04/2024   Breast Cancer Screening  09/09/2024   Pneumococcal Vaccine (2 of 2 - PPSV23, PCV20, or PCV21) 01/09/2025*   Medicare Annual Wellness Visit  09/15/2025   Pap with HPV screening  07/16/2029   DTaP/Tdap/Td vaccine (4 - Td or Tdap) 07/12/2033   Hepatitis C Screening  Completed   HIV Screening  Completed   Meningitis B Vaccine  Aged Out  *Topic was postponed. The date shown is not the original due date.       09/15/2024    8:44  AM  Advanced Directives  Does Patient Have a Medical Advance Directive? No  Would patient like information on creating a medical advance directive? Yes (MAU/Ambulatory/Procedural Areas - Information given)   Advance Care Planning is important because it: Ensures you receive medical care that aligns with your values, goals, and preferences. Provides guidance to your family and loved ones, reducing the emotional burden of decision-making during critical moments.  Vision: Annual vision screenings are recommended for early detection of glaucoma, cataracts, and diabetic retinopathy. These exams can also reveal signs of chronic conditions such as diabetes and high blood pressure.  Dental: Annual dental screenings help detect early signs of oral cancer, gum disease, and other conditions linked to overall health, including heart disease and diabetes.  Please see the attached documents for additional preventive care recommendations.

## 2024-09-15 NOTE — Progress Notes (Signed)
 Please attest and cosign this visit due to patients primary care provider not being immediately available at the time the visit was completed.   Subjective:   Samantha Rosales is a 43 y.o. who presents for a Medicare Wellness preventive visit. As a reminder, Annual Wellness Visits don't include a physical exam, and some assessments may be limited, especially if this visit is performed virtually. We may recommend an in-person follow-up visit with your provider if needed.  Visit Complete: In person  Persons Participating in Visit: Patient.  AWV Questionnaire: No: Patient Medicare AWV questionnaire was not completed prior to this visit. Cardiac Risk Factors include: advanced age (>23men, >38 women);dyslipidemia;hypertension;smoking/ tobacco exposure     Objective:    Today's Vitals   09/15/24 0846  BP: 125/80  Pulse: 90  Resp: 12  SpO2: 96%  Weight: 109 lb (49.4 kg)  Height: 5' 8.5 (1.74 m)  PainSc: 0-No pain   Body mass index is 16.33 kg/m.    09/15/2024    8:44 AM 04/25/2024    1:09 PM 12/30/2023    9:55 AM 12/18/2023   12:51 PM 12/13/2023    9:18 AM 06/04/2023   12:17 PM 09/19/2022    7:19 PM  Advanced Directives  Does Patient Have a Medical Advance Directive? No No No No No No No  Would patient like information on creating a medical advance directive? Yes (MAU/Ambulatory/Procedural Areas - Information given) No - Patient declined No - Patient declined        Current Medications (verified) Outpatient Encounter Medications as of 09/15/2024  Medication Sig   acetaminophen  (TYLENOL ) 325 MG tablet Take 2 tablets (650 mg total) by mouth every 6 (six) hours as needed.   CALCIUM  PO Take 1 tablet by mouth daily.   calcium -vitamin D  (OSCAL WITH D) 500-5 MG-MCG tablet Take 1 tablet by mouth daily with breakfast.   cholecalciferol (VITAMIN D3) 25 MCG (1000 UNIT) tablet Take 1 tablet (1,000 Units total) by mouth daily.   escitalopram  (LEXAPRO ) 10 MG tablet Take 1 tablet (10 mg  total) by mouth daily.   fluticasone  (FLONASE ) 50 MCG/ACT nasal spray PLACE 2 SPRAYS INTO BOTH NOSTRILS AS NEEDED FOR ALLERGIES OR RHINITIS.   furosemide  (LASIX ) 20 MG tablet TAKE 1 TABLET BY MOUTH EVERY DAY   gabapentin  (NEURONTIN ) 300 MG capsule TAKE 1 CAPSULE BY MOUTH 3 TIMES DAILY AS NEEDED.   hydrOXYzine  (VISTARIL ) 25 MG capsule Take 1 capsule (25 mg total) by mouth every 8 (eight) hours as needed for itching.   ibuprofen  (ADVIL ) 600 MG tablet Take 1 tablet (600 mg total) by mouth every 6 (six) hours as needed. Take medication with food to prevent upset stomach   iron  polysaccharides (NU-IRON ) 150 MG capsule Take 1 capsule (150 mg total) by mouth daily.   lipase/protease/amylase (CREON ) 36000 UNITS CPEP capsule Take 2 capsules (72,000 Units total) by mouth 3 times daily with meals and 1 capsule (36,000 Units total) by mouth with snacks up to 2 snacks per day.   losartan  (COZAAR ) 25 MG tablet TAKE 1 TABLET (25 MG TOTAL) BY MOUTH DAILY.   MAGNESIUM  PO Take 1 tablet by mouth daily.   Multiple Vitamin (MULTIVITAMIN WITH MINERALS) TABS tablet Take 1 tablet by mouth daily.   norethindrone  (AYGESTIN ) 5 MG tablet Take by mouth daily.   Omega 3 1000 MG CAPS Take 1 capsule (1,000 mg total) by mouth 2 (two) times daily.   ondansetron  (ZOFRAN ) 4 MG tablet Take 1 tablet (4 mg total) by mouth  every 8 (eight) hours as needed for nausea or vomiting.   spironolactone  (ALDACTONE ) 50 MG tablet TAKE 1 TABLET BY MOUTH EVERY DAY   traZODone  (DESYREL ) 50 MG tablet Take 1 tablet (50 mg total) by mouth at bedtime.   triamcinolone (KENALOG) 0.1 % paste Use as directed 1 Application in the mouth or throat 3 (three) times daily.   No facility-administered encounter medications on file as of 09/15/2024.    Allergies (verified) Penicillins   History: Past Medical History:  Diagnosis Date   Abnormal Pap smear    Alcohol use    Allergic rhinitis    Allergy     Anemia    Anxiety    BV (bacterial vaginosis)     Closed left ankle fracture    Depression    GERD (gastroesophageal reflux disease)    Hepatitis    LFT's >1000 (2019) due to methotrexate vs alcohol   High cholesterol    HSV-2 (herpes simplex virus 2) infection    Hypertension    Hypothyroidism 10/17/2019   Neuromuscular disorder (HCC)    Pancreatitis    12/19 (thought to be due to alcohol)   Rheumatoid arthritis (HCC)    Seizures (HCC)    dx as child with epilepsy but no seizures since 89 and on no meds   Vaginal Pap smear, abnormal    Past Surgical History:  Procedure Laterality Date   arm fracture     CERVICAL CONIZATION W/BX  12/20/2012   Procedure: CONIZATION CERVIX WITH BIOPSY;  Surgeon: Vonn VEAR Inch, MD;  Location: [facility];  Service: Gynecology;  Laterality: N/A;   FRACTURE SURGERY Left 2024   left arm   FRACTURE SURGERY Left 2022   left fracture   HOLMIUM LASER APPLICATION  12/20/2012   Procedure: HOLMIUM LASER APPLICATION;  Surgeon: Vonn VEAR Inch, MD;  Location: [facility];  Service: Gynecology;  Laterality: N/A;   Family History  Problem Relation Age of Onset   Hypertension Mother    Hypertension Father    Alcohol abuse Father    COPD Father    Diabetes Father    Diabetes Maternal Grandmother    Heart disease Maternal Grandmother    Varicose Veins Maternal Grandmother    Arthritis Paternal Grandmother    Pancreatic cancer Paternal Uncle    Colon cancer Neg Hx    Esophageal cancer Neg Hx    Inflammatory bowel disease Neg Hx    Liver disease Neg Hx    Rectal cancer Neg Hx    Stomach cancer Neg Hx    Social History   Socioeconomic History   Marital status: Married    Spouse name: Not on file   Number of children: 2   Years of education: Not on file   Highest education level: Associate degree: occupational, Scientist, product/process development, or vocational program  Occupational History   Occupation: [employer]    Employer: [employer]   Tobacco Use   Smoking status: Every Day    Current packs/day: 0.50     Average packs/day: 0.5 packs/day for 15.0 years (7.5 ttl pk-yrs)    Types: Cigarettes   Smokeless tobacco: Never  Vaping Use   Vaping status: Never Used  Substance and Sexual Activity   Alcohol use: Yes    Alcohol/week: 31.0 standard drinks of alcohol    Types: 3 Cans of beer, 28 Standard drinks or equivalent per week    Comment: 1 beer daily   Drug use: No   Sexual activity: Not Currently  Birth control/protection: Condom  Other Topics Concern   Not on file  Social History Narrative   Not on file   Social Drivers of Health   Financial Resource Strain: High Risk (09/15/2024)   Overall Financial Resource Strain (CARDIA)    Difficulty of Paying Living Expenses: Hard  Food Insecurity: No Food Insecurity (09/15/2024)   Hunger Vital Sign    Worried About Running Out of Food in the Last Year: Never true    Ran Out of Food in the Last Year: Never true  Transportation Needs: No Transportation Needs (09/15/2024)   PRAPARE - Administrator, Civil Service (Medical): No    Lack of Transportation (Non-Medical): No  Physical Activity: Inactive (09/15/2024)   Exercise Vital Sign    Days of Exercise per Week: 0 days    Minutes of Exercise per Session: 0 min  Stress: Stress Concern Present (09/15/2024)   [employer] of Occupational Health - Occupational Stress Questionnaire    Feeling of Stress: To some extent  Social Connections: Moderately Isolated (09/15/2024)   Social Connection and Isolation Panel    Frequency of Communication with Friends and Family: More than three times a week    Frequency of Social Gatherings with Friends and Family: Never    Attends Religious Services: Never    Database administrator or Organizations: No    Attends Engineer, structural: Never    Marital Status: Married    Tobacco Counseling Ready to quit: Yes Counseling given: Yes   Clinical Intake: Pre-visit preparation completed: Yes Pain : No/denies pain Pain Score:  0-No pain   BMI - recorded: 16.33 Nutritional Status: BMI <19  Underweight Nutritional Risks: None Diabetes: No Lab Results  Component Value Date   HGBA1C 5.3 11/16/2023   HGBA1C 6.0 (H) 03/06/2023   HGBA1C 4.3 (L) 08/26/2022    How often do you need to have someone help you when you read instructions, pamphlets, or other written materials from your doctor or pharmacy?: 1 - Never Interpreter Needed?: No Information entered by :: Andrzej Scully W CMA (AAMA)  Activities of Daily Living     09/15/2024    8:51 AM 12/13/2023    9:17 AM  In your present state of health, do you have any difficulty performing the following activities:  Hearing? 0 0  Vision? 1 1  Comment pt can't see unless she is right up on something. currently being evaluated by ophthalmology Patient states she is going to an eye doctor today for a check up  Difficulty concentrating or making decisions? 0 0  Walking or climbing stairs? 0   Dressing or bathing? 0   Doing errands, shopping? 0   Preparing Food and eating ? N   Using the Toilet? N   In the past six months, have you accidently leaked urine? N   Do you have problems with loss of bowel control? N   Managing your Medications? N   Managing your Finances? N   Housekeeping or managing your Housekeeping? N    Patient Care Team: Del Wilhelmena Falter, Hilario, FNP as PCP - General (Family Medicine) Signa Delon LABOR, NP as Nurse Practitioner (Obstetrics and Gynecology) Maree Paticia BRAVO, MD as Referring Physician (Ophthalmology) Tobie Eldora NOVAK, MD as Consulting Physician (Otolaryngology)  I have updated your Care Teams any recent Medical Services you may have received from other providers in the past year.     Assessment:   This is a routine wellness examination for Samantha Rosales.  Hearing/Vision screen Hearing Screening - Comments:: Patient denies any hearing difficulties.   Vision Screening - Comments:: Wears rx glasses - up to date with routine eye exams with  Dr. Maree  in [place] with [facility]  Goals Addressed               This Visit's Progress     I want to gain weight (pt-stated)          Depression Screen     09/15/2024    8:56 AM 07/16/2024    3:35 PM 04/10/2024    1:06 PM 10/26/2023    9:08 AM 06/29/2023    8:20 AM 06/29/2023    8:19 AM 06/12/2023   11:44 AM  PHQ 2/9 Scores  PHQ - 2 Score 0 0 0 1 0 0 2  PHQ- 9 Score 0 0 0 5 0 0 7     Fall Risk     09/15/2024    8:49 AM 07/16/2024    3:35 PM 04/10/2024    1:06 PM 11/19/2023    1:55 PM 10/17/2023    8:58 AM  Fall Risk   Falls in the past year? 1 0 0 1 1  Number falls in past yr: 1 0 0 0 0  Injury with Fall? 0 0 0 1 1  Comment    broke left foot last year   Risk for fall due to : History of fall(s) No Fall Risks No Fall Risks    Follow up Falls evaluation completed;Education provided;Falls prevention discussed Falls evaluation completed Falls evaluation completed      MEDICARE RISK AT HOME:  Medicare Risk at Home Any stairs in or around the home?: Yes If so, are there any without handrails?: No Home free of loose throw rugs in walkways, pet beds, electrical cords, etc?: Yes Adequate lighting in your home to reduce risk of falls?: Yes Life alert?: No Use of a cane, walker or w/c?: No Grab bars in the bathroom?: No Shower chair or bench in shower?: No Elevated toilet seat or a handicapped toilet?: No  TIMED UP AND GO: Was the test performed?  Yes  Length of time to ambulate 10 feet: 5 sec Gait steady and fast without use of assistive device  Cognitive Function: 6CIT completed        09/15/2024    8:54 AM 06/29/2023    8:20 AM  6CIT Screen  What Year? 0 points 0 points  What month? 0 points 0 points  What time? 0 points 0 points  Count back from 20 0 points 0 points  Months in reverse 0 points 0 points  Repeat phrase 0 points 0 points  Total Score 0 points 0 points    Immunizations Immunization History  Administered Date(s) Administered   Influenza Split  09/03/2013, 09/23/2013   Influenza, Seasonal, Injecte, Preservative Fre 09/15/2024   Influenza,inj,Quad PF,6+ Mos 09/23/2013, 09/22/2016, 08/27/2017, 09/11/2017   Influenza,inj,quad, With Preservative 09/03/2017   PFIZER(Purple Top)SARS-COV-2 Vaccination 03/26/2020, 04/19/2020   PPD Test 11/02/2010   Pneumococcal Conjugate-13 11/08/2015   Tdap 10/30/2012, 09/15/2022, 07/13/2023    Screening Tests Health Maintenance  Topic Date Due   Hepatitis B Vaccines 19-59 Average Risk (1 of 3 - 19+ 3-dose series) Never done   HPV VACCINES (1 - 3-dose SCDM series) Never done   COVID-19 Vaccine (3 - Pfizer risk series) 05/17/2020   Mammogram  09/09/2024   Pneumococcal Vaccine (2 of 2 - PPSV23, PCV20, or PCV21) 01/09/2025 (Originally 01/03/2016)  Medicare Annual Wellness (AWV)  09/15/2025   Cervical Cancer Screening (HPV/Pap Cotest)  07/16/2029   DTaP/Tdap/Td (4 - Td or Tdap) 07/12/2033   Influenza Vaccine  Completed   Hepatitis C Screening  Completed   HIV Screening  Completed   Meningococcal B Vaccine  Aged Out    Health Maintenance Health Maintenance Due  Topic Date Due   Hepatitis B Vaccines 19-59 Average Risk (1 of 3 - 19+ 3-dose series) Never done   HPV VACCINES (1 - 3-dose SCDM series) Never done   COVID-19 Vaccine (3 - Pfizer risk series) 05/17/2020   Mammogram  09/09/2024   Health Maintenance Items Addressed: Vaccines Given today: Influenza.   Additional Screening: Vision Screening: Recommended annual ophthalmology exams for early detection of glaucoma and other disorders of the eye. Would you like a referral to an eye doctor? No    Dental Screening: Recommended annual dental exams for proper oral hygiene  Community Resource Referral / Chronic Care Management: CRR required this visit?  No   CCM required this visit?  No  Plan:   I have personally reviewed and noted the following in the patient's chart:   Medical and social history Use of alcohol, tobacco or illicit drugs   Current medications and supplements including opioid prescriptions. Patient is not currently taking opioid prescriptions. Functional ability and status Nutritional status Physical activity Advanced directives List of other physicians Hospitalizations, surgeries, and ER visits in previous 12 months Vitals Screenings to include cognitive, depression, and falls Referrals and appointments  In addition, I have reviewed and discussed with patient certain preventive protocols, quality metrics, and best practice recommendations. A written personalized care plan for preventive services as well as general preventive health recommendations were provided to patient.   Kenedy Haisley, CMA   09/15/2024   After Visit Summary: (In Person-Printed) AVS printed and given to the patient  Notes: Please refer to Routing Comments.

## 2024-09-17 ENCOUNTER — Other Ambulatory Visit: Payer: Self-pay

## 2024-09-17 DIAGNOSIS — R1310 Dysphagia, unspecified: Secondary | ICD-10-CM

## 2024-09-17 DIAGNOSIS — E43 Unspecified severe protein-calorie malnutrition: Secondary | ICD-10-CM

## 2024-09-17 MED ORDER — ENSURE ENLIVE PO LIQD: 237.0000 mL | ORAL | 12 refills | Status: AC

## 2024-09-19 ENCOUNTER — Other Ambulatory Visit: Payer: Self-pay | Admitting: Internal Medicine

## 2024-09-19 ENCOUNTER — Other Ambulatory Visit (INDEPENDENT_AMBULATORY_CARE_PROVIDER_SITE_OTHER): Payer: Self-pay | Admitting: Otolaryngology

## 2024-09-19 DIAGNOSIS — J0110 Acute frontal sinusitis, unspecified: Secondary | ICD-10-CM

## 2024-09-22 ENCOUNTER — Telehealth: Payer: Self-pay

## 2024-09-22 NOTE — Telephone Encounter (Signed)
 Copied from CRM #[id number]. Topic: Clinical - Medical Advice >> Sep 22, 2024 10:49 AM [place] MATSU wrote: Reason for CRM:  Patient called in to advise she received letter from Mammogram results and they state negative however there is concerns regarding denseness and she is wanting to know what is the next step.   May you please assist.

## 2024-09-22 NOTE — Telephone Encounter (Signed)
 Patient advised.

## 2024-09-22 NOTE — Telephone Encounter (Signed)
Screening mammogram in one year.

## 2024-09-23 ENCOUNTER — Other Ambulatory Visit: Payer: Self-pay | Admitting: *Deleted

## 2024-09-23 ENCOUNTER — Telehealth: Payer: Self-pay | Admitting: *Deleted

## 2024-09-23 DIAGNOSIS — D649 Anemia, unspecified: Secondary | ICD-10-CM

## 2024-09-23 NOTE — Telephone Encounter (Signed)
 Pt saw Josette and she has an upcoming appointment with you on 10/21/24. She was referred to hematology back in June 2025. She cancelled 2 appointments and no showed one. She says needs a new referral to see hematology. Please advise. Thank you

## 2024-09-23 NOTE — Telephone Encounter (Signed)
 Referral sent

## 2024-09-30 ENCOUNTER — Encounter: Payer: Self-pay | Admitting: *Deleted

## 2024-10-03 ENCOUNTER — Other Ambulatory Visit: Payer: Self-pay | Admitting: Family Medicine

## 2024-10-03 ENCOUNTER — Other Ambulatory Visit: Payer: Self-pay | Admitting: Internal Medicine

## 2024-10-03 DIAGNOSIS — R110 Nausea: Secondary | ICD-10-CM

## 2024-10-06 ENCOUNTER — Encounter

## 2024-10-06 ENCOUNTER — Telehealth: Payer: Self-pay | Admitting: Gastroenterology

## 2024-10-06 NOTE — Telephone Encounter (Addendum)
 Procedure: Upper EUS Procedure date: 10/13/24 Procedure location: [facility] Arrival Time: 11:53 am Spoke with the patient Y/N:   No, I left a detailed message on [phone] on 10/06/24 @ 9:59 am for the patient to return call   No, I left a detailed message on [phone] on 10/07/24 @ 11:20 am for the patient to return call. Mychart message sent   No, I left a detailed message on [phone] on 10/08/24 @ 11:13 am for the patient to return call. Called patient's mother at [phone] with a message for pt to return call. Never received a call back.  Any prep concerns? ___  Has the patient obtained the prep from the pharmacy ? ___ Do you have a care partner and transportation: ___ Any additional concerns? ___

## 2024-10-07 ENCOUNTER — Encounter (HOSPITAL_COMMUNITY): Payer: Self-pay | Admitting: Gastroenterology

## 2024-10-07 ENCOUNTER — Ambulatory Visit

## 2024-10-07 NOTE — Progress Notes (Signed)
 Attempted to obtain medical history for pre op call via telephone, unable to reach at this time. HIPAA compliant voicemail message left requesting return call to pre surgical testing department.

## 2024-10-09 NOTE — Telephone Encounter (Signed)
 Noted the pt was advised and all information mailed and sent to My CH[facility]

## 2024-10-11 NOTE — Anesthesia Preprocedure Evaluation (Addendum)
 Anesthesia Evaluation    Reviewed: Allergy  & Precautions, Patient's Chart, lab work & pertinent test results  Airway        Dental   Pulmonary Current Smoker and Patient abstained from smoking.          Cardiovascular hypertension,      Neuro/Psych Seizures -,  PSYCHIATRIC DISORDERS Anxiety Depression     Neuromuscular disease    GI/Hepatic ,GERD  ,,  Endo/Other  Hypothyroidism    Renal/GU Renal disease     Musculoskeletal  (+) Arthritis , Rheumatoid disorders,    Abdominal   Peds  Hematology   Anesthesia Other Findings All: PCN  Reproductive/Obstetrics                              Anesthesia Physical Anesthesia Plan  ASA: 3  Anesthesia Plan: MAC   Post-op Pain Management: Minimal or no pain anticipated   Induction: Intravenous  PONV Risk Score and Plan: Treatment may vary due to age or medical condition and Propofol  infusion  Airway Management Planned: Natural Airway and Nasal Cannula  Additional Equipment: None  Intra-op Plan:   Post-operative Plan:   Informed Consent:      Dental advisory given  Plan Discussed with:   Anesthesia Plan Comments: (Upper GI ultrasound Chronic pancreatitis)         Anesthesia Quick Evaluation

## 2024-10-13 ENCOUNTER — Ambulatory Visit (HOSPITAL_COMMUNITY): Admission: RE | Admit: 2024-10-13 | Source: Home / Self Care | Admitting: Gastroenterology

## 2024-10-13 ENCOUNTER — Encounter (HOSPITAL_COMMUNITY): Payer: Self-pay | Admitting: Anesthesiology

## 2024-10-13 ENCOUNTER — Telehealth: Payer: Self-pay

## 2024-10-13 SURGERY — ULTRASOUND, UPPER GI TRACT, ENDOSCOPIC
Anesthesia: Monitor Anesthesia Care

## 2024-10-13 NOTE — Progress Notes (Signed)
 Patient called and stated she was nervous and wanted to reschedule her procedure for today. I tried to reassure her that she would talk to the doctors beforehand to answer any questions. I also told her it was challenging to get on dr. Melba schedule. She said she was still cancelling and did not want to come today and she hung up.

## 2024-10-13 NOTE — Telephone Encounter (Signed)
 Left message on machine to call back

## 2024-10-13 NOTE — Telephone Encounter (Signed)
 Bascom Darice BIRCH, RN  Mansouraty, Aloha Raddle., MD; Anitra Odetta CROME, RN See note about cancelling       Attached Notes  Progress Notes by Bascom Darice BIRCH, RN at 10/13/2024  9:16 AM  Author: Bascom Darice BIRCH, RN Service: Endoscopy Author Type: Registered Nurse  Filed: 10/13/2024  9:17 AM Date of Service: 10/13/2024  9:16 AM Note Type: Progress Notes  Status: Signed Editor: Bascom Darice BIRCH, RN (Registered Nurse)  Patient called and stated she was nervous and wanted to reschedule her procedure for today. I tried to reassure her that she would talk to the doctors beforehand to answer any questions. I also told her it was challenging to get on dr. Melba schedule. She said she was still cancelling and did not want to come today and she hung up.    Routing History

## 2024-10-13 NOTE — Telephone Encounter (Signed)
 Mansouraty, Aloha Raddle., MD  Bascom Darice BIRCH, RN; Anitra Odetta CROME, RN; Rudy Josette GORMAN DEVONNA; Rourk, Lamar HERO, MD Darice, This is unfortunate, that she cancelled last minute and did not call to let us  know beforehand. These spots are very important to keep filled. In any case, at this point I will defer to the primary [facility] team to talk with patient further. If they are still interested in patient undergoing a procedure/evaluation then she will need to be seen in clinic with me before she is rescheduled. Otherwise, patient may be referred outside of the system if necessary as I am already scheduling in to January and February.  FYI KSH and RR

## 2024-10-15 ENCOUNTER — Telehealth: Payer: Self-pay | Admitting: *Deleted

## 2024-10-15 NOTE — Telephone Encounter (Signed)
 Spoke to pt, informed her that there was not a procedure scheduled for her next week. She stated she was going to call [facility] and find out what happened, because she was supposed to have an appointment. I informed her to call [facility] and find out what was going on and call me back.

## 2024-10-15 NOTE — Telephone Encounter (Signed)
 Pt called back and stated that she was confused about how we were in [place] and Dr. Wilhelmenia was in [place]. I explained to her that we don't do EUS here that she would have to go to [facility] for it. Informed pt that she has a follow up appointment here on 10/21/24 with Charmaine Melia and that next steps would be discussed then. Pt verbalized understanding.

## 2024-10-15 NOTE — Telephone Encounter (Signed)
 Pt called and stated she is having a procedure next week with Dr.Mansouraty. She needs a Xanax for anxiety. She thinks this is why she cancelled, because she is afraid to have procedure done. Please advise.   FYI Pt has cancelled several times.

## 2024-10-15 NOTE — Telephone Encounter (Signed)
 Noted

## 2024-10-21 ENCOUNTER — Ambulatory Visit: Admitting: Gastroenterology

## 2024-10-21 ENCOUNTER — Encounter (INDEPENDENT_AMBULATORY_CARE_PROVIDER_SITE_OTHER): Payer: Self-pay | Admitting: *Deleted

## 2024-10-21 NOTE — Telephone Encounter (Signed)
 I have discussed this patient's relationship with our practice at length with the office manager multiple staff members.  She now has a long track record of noncompliance.  She has potential significant health issues.  She has canceled out on 2 scheduled endoscopic ultrasounds.  Communication has been difficult between the patient and our office.  Also, difficulties in the past scheduling EGD and colonoscopy.  These challenges have been reviewed with the patient repeatedly.  Patient's behavior has not changed.  At this point, I have decided to terminate the doctor-patient relationship here because it is nonproductive.  See termination letter.

## 2024-11-18 ENCOUNTER — Other Ambulatory Visit: Payer: Self-pay | Admitting: Family Medicine

## 2024-11-18 ENCOUNTER — Other Ambulatory Visit (INDEPENDENT_AMBULATORY_CARE_PROVIDER_SITE_OTHER): Payer: Self-pay | Admitting: Otolaryngology

## 2024-11-18 DIAGNOSIS — N946 Dysmenorrhea, unspecified: Secondary | ICD-10-CM

## 2024-11-28 ENCOUNTER — Telehealth: Payer: Self-pay | Admitting: Gastroenterology

## 2024-11-28 NOTE — Telephone Encounter (Signed)
 Inbound call from patient requesting a call back from nurse Patty. She states she can't remember if they had talked. Please advise.

## 2024-12-01 ENCOUNTER — Other Ambulatory Visit: Payer: Self-pay

## 2024-12-01 DIAGNOSIS — D509 Iron deficiency anemia, unspecified: Secondary | ICD-10-CM

## 2024-12-02 ENCOUNTER — Inpatient Hospital Stay

## 2024-12-02 ENCOUNTER — Inpatient Hospital Stay: Attending: Internal Medicine | Admitting: Internal Medicine

## 2024-12-02 VITALS — BP 133/89 | Temp 98.0°F | Resp 17 | Ht 68.5 in | Wt 118.0 lb

## 2024-12-02 DIAGNOSIS — R7401 Elevation of levels of liver transaminase levels: Secondary | ICD-10-CM | POA: Diagnosis not present

## 2024-12-02 DIAGNOSIS — Z800 Family history of malignant neoplasm of digestive organs: Secondary | ICD-10-CM | POA: Diagnosis not present

## 2024-12-02 DIAGNOSIS — F1010 Alcohol abuse, uncomplicated: Secondary | ICD-10-CM

## 2024-12-02 DIAGNOSIS — R739 Hyperglycemia, unspecified: Secondary | ICD-10-CM

## 2024-12-02 DIAGNOSIS — D509 Iron deficiency anemia, unspecified: Secondary | ICD-10-CM | POA: Insufficient documentation

## 2024-12-02 DIAGNOSIS — D638 Anemia in other chronic diseases classified elsewhere: Secondary | ICD-10-CM

## 2024-12-02 LAB — CBC WITH DIFFERENTIAL (CANCER CENTER ONLY)
Abs Immature Granulocytes: 0.04 K/uL (ref 0.00–0.07)
Basophils Absolute: 0 K/uL (ref 0.0–0.1)
Basophils Relative: 1 %
Eosinophils Absolute: 0.1 K/uL (ref 0.0–0.5)
Eosinophils Relative: 2 %
HCT: 29.8 % — ABNORMAL LOW (ref 36.0–46.0)
Hemoglobin: 9.8 g/dL — ABNORMAL LOW (ref 12.0–15.0)
Immature Granulocytes: 1 %
Lymphocytes Relative: 27 %
Lymphs Abs: 1.6 K/uL (ref 0.7–4.0)
MCH: 29.4 pg (ref 26.0–34.0)
MCHC: 32.9 g/dL (ref 30.0–36.0)
MCV: 89.5 fL (ref 80.0–100.0)
Monocytes Absolute: 0.6 K/uL (ref 0.1–1.0)
Monocytes Relative: 10 %
Neutro Abs: 3.7 K/uL (ref 1.7–7.7)
Neutrophils Relative %: 59 %
Platelet Count: 178 K/uL (ref 150–400)
RBC: 3.33 MIL/uL — ABNORMAL LOW (ref 3.87–5.11)
RDW: 18.2 % — ABNORMAL HIGH (ref 11.5–15.5)
WBC Count: 6.1 K/uL (ref 4.0–10.5)
nRBC: 0 % (ref 0.0–0.2)

## 2024-12-02 LAB — CMP (CANCER CENTER ONLY)
ALT: 13 U/L (ref 0–44)
AST: 56 U/L — ABNORMAL HIGH (ref 15–41)
Albumin: 4 g/dL (ref 3.5–5.0)
Alkaline Phosphatase: 180 U/L — ABNORMAL HIGH (ref 38–126)
Anion gap: 12 (ref 5–15)
BUN: 10 mg/dL (ref 6–20)
CO2: 27 mmol/L (ref 22–32)
Calcium: 9.1 mg/dL (ref 8.9–10.3)
Chloride: 101 mmol/L (ref 98–111)
Creatinine: 0.7 mg/dL (ref 0.44–1.00)
GFR, Estimated: >60 mL/min
Glucose, Bld: 193 mg/dL — ABNORMAL HIGH (ref 70–99)
Potassium: 3.7 mmol/L (ref 3.5–5.1)
Sodium: 140 mmol/L (ref 135–145)
Total Bilirubin: 0.4 mg/dL (ref 0.0–1.2)
Total Protein: 8.3 g/dL — ABNORMAL HIGH (ref 6.5–8.1)

## 2024-12-02 LAB — IRON AND IRON BINDING CAPACITY (CC-WL,HP ONLY)
Iron: 37 ug/dL (ref 28–170)
Saturation Ratios: 8 % — ABNORMAL LOW (ref 10.4–31.8)
TIBC: 487 ug/dL — ABNORMAL HIGH (ref 250–450)
UIBC: 450 ug/dL

## 2024-12-02 LAB — FOLATE: Folate: >20 ng/mL

## 2024-12-02 LAB — VITAMIN B12: Vitamin B-12: 702 pg/mL (ref 180–914)

## 2024-12-02 NOTE — Progress Notes (Signed)
 "   [facility] [phone]   [phone]  CONSULT NOTE  REFERRING PHYSICIAN: Hilario Terry Wilhelmena Lloyd, FNP  REASON FOR CONSULTATION:  43 years old female with persistent anemia.  HPI Samantha Rosales is a 43 y.o. female past medical history significant for multiple medical problems including hypertension, hypothyroidism, history of pancreatitis, rheumatoid arthritis, dyslipidemia, GERD, depression/anxiety, anemia, allergic rhinitis, and history of hepatitis.  The patient has a history of persistent anemia that has been going on for a while.  Reviewing her record to October 17, 2019 her hemoglobin was 10.9 and hematocrit 32.2%.  On Apr 25, 2024 her hemoglobin was down to 6.5 and hematocrit of 19%.  He was seen by gastroenterology in the past with Dr. Corinthia in [place] [facility]  in addition to Dr. Wilhelmenia with [facility] gastroenterology HPI  Discussed the use of AI scribe software for clinical note transcription with the patient, who gave verbal consent to proceed.  History of Present Illness Samantha Rosales is a 43 year old female with persistent iron  deficiency anemia who presents for hematology evaluation of ongoing anemia.  She has experienced chronic iron  deficiency anemia since at least 2020, with a recent severe episode in May 2025 when hemoglobin decreased to 6.5 g/dL and hematocrit to 80.9%, requiring hospitalization and transfusion. She recalls a significant episode of epistaxis lasting several hours a few months prior to that admission. She denies noticing blood in her stool or urine.  She has not yet completed colonoscopy or endoscopy due to missed appointments and changes in gastroenterology providers, but is in the process of rescheduling these procedures.  She currently takes prescription iron  supplementation daily, along with vitamin D3, magnesium , and a multivitamin. She sometimes takes vitamin C with her iron . She denies B12  supplementation and tolerates her iron  regimen without adverse effects.  She reports feeling well overall, with no fatigue, dizziness, nausea, vomiting, or recent weight loss. She endorses persistent cold intolerance and intermittent diarrhea. She notes a weight increase from 98 kg to 118 kg over an unspecified period. Family history significant for mother with hypertension and father had hypertension, diabetes mellitus and alcohol abuse.  The patient is married and has 2 children.  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  She has a history of smoking for around 30 years she also has a history of alcohol abuse but recently drinks up to 4 beers a day and no history of drug abuse.    Past Medical History:  Diagnosis Date   Abnormal Pap smear    Alcohol use    Allergic rhinitis    Allergy     Anemia    Anxiety    BV (bacterial vaginosis)    Closed left ankle fracture    Depression    GERD (gastroesophageal reflux disease)    Hepatitis    LFT's >1000 (2019) due to methotrexate vs alcohol   High cholesterol    HSV-2 (herpes simplex virus 2) infection    Hypertension    Hypothyroidism 10/17/2019   Neuromuscular disorder (HCC)    Pancreatitis    12/19 (thought to be due to alcohol)   Rheumatoid arthritis (HCC)    Seizures (HCC)    dx as child with epilepsy but no seizures since 89 and on no meds   Vaginal Pap smear, abnormal       Past Surgical History:  Procedure Laterality Date   arm fracture     CERVICAL CONIZATION W/BX  12/20/2012   Procedure: CONIZATION CERVIX  WITH BIOPSY;  Surgeon: Vonn VEAR Inch, MD;  Location: [facility];  Service: Gynecology;  Laterality: N/A;   FRACTURE SURGERY Left 2024   left arm   FRACTURE SURGERY Left 2022   left fracture   HOLMIUM LASER APPLICATION  12/20/2012   Procedure: HOLMIUM LASER APPLICATION;  Surgeon: Vonn VEAR Inch, MD;  Location: [facility];  Service: Gynecology;  Laterality: N/A;    Family History  Problem Relation Age of Onset    Hypertension Mother    Hypertension Father    Alcohol abuse Father    COPD Father    Diabetes Father    Diabetes Maternal Grandmother    Heart disease Maternal Grandmother    Varicose Veins Maternal Grandmother    Arthritis Paternal Grandmother    Pancreatic cancer Paternal Uncle    Colon cancer Neg Hx    Esophageal cancer Neg Hx    Inflammatory bowel disease Neg Hx    Liver disease Neg Hx    Rectal cancer Neg Hx    Stomach cancer Neg Hx     Social History Social History[1]  Allergies[2]  Current Outpatient Medications  Medication Sig Dispense Refill   acetaminophen  (TYLENOL ) 325 MG tablet Take 2 tablets (650 mg total) by mouth every 6 (six) hours as needed. 36 tablet 0   CALCIUM  PO Take 1 tablet by mouth daily.     calcium -vitamin D  (OSCAL WITH D) 500-5 MG-MCG tablet Take 1 tablet by mouth daily with breakfast. 30 tablet 0   cholecalciferol (VITAMIN D3) 25 MCG (1000 UNIT) tablet Take 1 tablet (1,000 Units total) by mouth daily. 90 tablet 1   escitalopram  (LEXAPRO ) 10 MG tablet Take 1 tablet (10 mg total) by mouth daily. 90 tablet 3   feeding supplement (ENSURE ENLIVE / ENSURE PLUS) LIQD Take 237 mLs by mouth daily. 7110 mL 12   fluticasone  (FLONASE ) 50 MCG/ACT nasal spray PLACE 2 SPRAYS INTO BOTH NOSTRILS AS NEEDED FOR ALLERGIES OR RHINITIS. 48 mL 0   furosemide  (LASIX ) 20 MG tablet TAKE 1 TABLET BY MOUTH EVERY DAY 90 tablet 1   gabapentin  (NEURONTIN ) 300 MG capsule TAKE 1 CAPSULE BY MOUTH 3 TIMES DAILY AS NEEDED. 90 capsule 3   hydrOXYzine  (VISTARIL ) 25 MG capsule TAKE 1 CAPSULE (25 MG TOTAL) BY MOUTH EVERY 8 (EIGHT) HOURS AS NEEDED FOR ITCHING. 30 capsule 2   ibuprofen  (ADVIL ) 600 MG tablet TAKE 1 TABLET BY MOUTH EVERY 6 HOURS AS NEEDED. TAKE MEDICATION WITH FOOD TO PREVENT UPSET STOMACH 30 tablet 1   iron  polysaccharides (NU-IRON ) 150 MG capsule Take 1 capsule (150 mg total) by mouth daily. 30 capsule 3   lipase/protease/amylase (CREON ) 36000 UNITS CPEP capsule Take 2  capsules (72,000 Units total) by mouth 3 times daily with meals and 1 capsule (36,000 Units total) by mouth with snacks up to 2 snacks per day. 240 capsule 5   losartan  (COZAAR ) 25 MG tablet TAKE 1 TABLET (25 MG TOTAL) BY MOUTH DAILY. 90 tablet 1   MAGNESIUM  PO Take 1 tablet by mouth daily.     Multiple Vitamin (MULTIVITAMIN WITH MINERALS) TABS tablet Take 1 tablet by mouth daily.     norethindrone  (AYGESTIN ) 5 MG tablet Take by mouth daily.     Omega 3 1000 MG CAPS Take 1 capsule (1,000 mg total) by mouth 2 (two) times daily. 60 capsule 3   ondansetron  (ZOFRAN ) 4 MG tablet TAKE 1 TABLET BY MOUTH EVERY 8 HOURS AS NEEDED FOR NAUSEA AND VOMITING 20 tablet 1  spironolactone  (ALDACTONE ) 50 MG tablet TAKE 1 TABLET BY MOUTH EVERY DAY 90 tablet 1   traZODone  (DESYREL ) 50 MG tablet Take 1 tablet (50 mg total) by mouth at bedtime. 30 tablet 2   triamcinolone (KENALOG) 0.1 % paste Use as directed 1 Application in the mouth or throat 3 (three) times daily.     No current facility-administered medications for this visit.    Review of Systems  Constitutional: positive for fatigue Eyes: negative Ears, nose, mouth, throat, and face: negative Respiratory: negative Cardiovascular: negative Gastrointestinal: negative Genitourinary:negative Integument/breast: negative Hematologic/lymphatic: negative Musculoskeletal:negative Neurological: negative Behavioral/Psych: negative Endocrine: negative Allergic/Immunologic: negative  Physical Exam  MJO:jozmu, healthy, no distress, well nourished, well developed, and anxious SKIN: skin color, texture, turgor are normal, no rashes or significant lesions HEAD: Normocephalic, No masses, lesions, tenderness or abnormalities EYES: normal, PERRLA, Conjunctiva are pink and non-injected EARS: External ears normal, Canals clear OROPHARYNX:no exudate, no erythema, and lips, buccal mucosa, and tongue normal  NECK: supple, no adenopathy, no JVD LYMPH:  no palpable  lymphadenopathy, no hepatosplenomegaly BREAST:not examined LUNGS: clear to auscultation , and palpation HEART: regular rate & rhythm, no murmurs, and no gallops ABDOMEN:abdomen soft, non-tender, normal bowel sounds, and no masses or organomegaly BACK: Back symmetric, no curvature., No CVA tenderness EXTREMITIES:no joint deformities, effusion, or inflammation, no edema  NEURO: alert & oriented x 3 with fluent speech, no focal motor/sensory deficits  PERFORMANCE STATUS: ECOG 1  LABORATORY DATA: Lab Results  Component Value Date   WBC 7.3 05/01/2024   HGB 10.3 (L) 05/01/2024   HCT 32.6 (L) 05/01/2024   MCV 99.1 05/01/2024   PLT 222 05/01/2024      Chemistry      Component Value Date/Time   NA 141 07/11/2024 1349   NA 139 04/24/2024 0920   K 3.9 07/11/2024 1349   CL 107 07/11/2024 1349   CO2 28 07/11/2024 1349   BUN 10 07/11/2024 1349   BUN 3 (L) 04/24/2024 0920   CREATININE 0.59 07/11/2024 1349      Component Value Date/Time   CALCIUM  8.6 07/11/2024 1349   ALKPHOS 146 (H) 04/25/2024 1802   AST 58 (H) 05/01/2024 1124   ALT 19 05/01/2024 1124   BILITOT 0.8 05/01/2024 1124   BILITOT <0.2 03/06/2023 1122       RADIOGRAPHIC STUDIES: No results found.  ASSESSMENT: This is a very pleasant 43 years old female with persistent anemia that has been going on for years likely anemia of chronic disease +/- iron  deficiency. Repeat blood work today showed hemoglobin of 9.8 and hematocrit 29.3% with normal serum iron  but slightly low iron  saturation.  The patient is currently on oral iron  supplements.  PLAN: Assessment and Plan Assessment & Plan Chronic anemia Chronic anemia dating to at least 2020, with a recent severe episode requiring transfusion. Iron  studies show normal iron  but low saturation, consistent with ongoing iron  deficiency. No overt gastrointestinal bleeding, though prior prolonged epistaxis may have contributed. Gastrointestinal sources remain unexcluded due to  missed colonoscopy and endoscopy. She tolerates iron  supplementation and reports cold intolerance, without fatigue or dizziness. - Ordered repeat blood work including iron  studies and vitamin B12. - Recommended continued iron  supplementation with vitamin C or orange juice to enhance absorption. - Advised completion of colonoscopy and upper endoscopy to evaluate for gastrointestinal sources of bleeding. - Scheduled follow-up in three months to review colonoscopy/endoscopy results and repeat blood work.  Alcohol use disorder Ongoing alcohol intake (2-4 beers per week) contributes to elevated liver  enzymes and increases risk for hepatic injury. - Advised reduction of alcohol intake due to its impact on liver function and overall health.  Elevated liver enzymes Recent laboratory results show elevated liver enzymes, likely secondary to ongoing alcohol use. - Advised reduction of alcohol intake.  Hyperglycemia Recent laboratory results show elevated blood glucose, possibly related to dietary intake or early diabetes. Further evaluation is warranted to clarify etiology. - Advised follow-up with primary care provider to monitor blood glucose and evaluate for diabetes. She was advised to call immediately if she has any other concerning symptoms in the interval.   The patient voices understanding of current disease status and treatment options and is in agreement with the current care plan.  All questions were answered. The patient knows to call the clinic with any problems, questions or concerns. We can certainly see the patient much sooner if necessary.  Thank you so much for allowing me to participate in the care of Samantha Rosales. I will continue to follow up the patient with you and assist in her care.  The total time spent in the appointment was 60 minutes including review of chart and various tests results, discussions about plan of care and coordination of care plan .   Disclaimer: This  note was dictated with voice recognition software. Similar sounding words can inadvertently be transcribed and may not be corrected upon review.   Samantha Rosales December 02, 2024, 11:40 AM       [1]  Social History Tobacco Use   Smoking status: Every Day    Current packs/day: 0.50    Average packs/day: 0.5 packs/day for 15.0 years (7.5 ttl pk-yrs)    Types: Cigarettes   Smokeless tobacco: Never  Vaping Use   Vaping status: Never Used  Substance Use Topics   Alcohol use: Yes    Alcohol/week: 31.0 standard drinks of alcohol    Types: 3 Cans of beer, 28 Standard drinks or equivalent per week    Comment: 1 beer daily   Drug use: No  [2]  Allergies Allergen Reactions   Penicillins Hives, Rash and Other (See Comments)   "

## 2024-12-04 ENCOUNTER — Other Ambulatory Visit: Payer: Self-pay | Admitting: Family Medicine

## 2025-01-06 ENCOUNTER — Ambulatory Visit

## 2025-01-14 ENCOUNTER — Ambulatory Visit: Payer: Self-pay

## 2025-03-02 ENCOUNTER — Inpatient Hospital Stay: Attending: Internal Medicine

## 2025-03-02 ENCOUNTER — Inpatient Hospital Stay: Admitting: Internal Medicine

## 2025-04-13 ENCOUNTER — Ambulatory Visit: Payer: Self-pay

## 2025-09-16 ENCOUNTER — Ambulatory Visit
